# Patient Record
Sex: Female | Born: 1940
Health system: Southern US, Community
[De-identification: ages and names within clinical notes are randomized; demographics above are authoritative.]

## PROBLEM LIST (undated history)

## (undated) DIAGNOSIS — R002 Palpitations: Secondary | ICD-10-CM

## (undated) DIAGNOSIS — D126 Benign neoplasm of colon, unspecified: Secondary | ICD-10-CM

## (undated) DIAGNOSIS — M545 Low back pain, unspecified: Secondary | ICD-10-CM

## (undated) DIAGNOSIS — I10 Essential (primary) hypertension: Secondary | ICD-10-CM

## (undated) DIAGNOSIS — E739 Lactose intolerance, unspecified: Secondary | ICD-10-CM

## (undated) DIAGNOSIS — M25559 Pain in unspecified hip: Secondary | ICD-10-CM

## (undated) DIAGNOSIS — K219 Gastro-esophageal reflux disease without esophagitis: Secondary | ICD-10-CM

## (undated) DIAGNOSIS — K5730 Diverticulosis of large intestine without perforation or abscess without bleeding: Secondary | ICD-10-CM

## (undated) DIAGNOSIS — R195 Other fecal abnormalities: Secondary | ICD-10-CM

## (undated) DIAGNOSIS — E785 Hyperlipidemia, unspecified: Secondary | ICD-10-CM

## (undated) DIAGNOSIS — T884XXA Failed or difficult intubation, initial encounter: Secondary | ICD-10-CM

## (undated) DIAGNOSIS — C801 Malignant (primary) neoplasm, unspecified: Secondary | ICD-10-CM

## (undated) DIAGNOSIS — K635 Polyp of colon: Secondary | ICD-10-CM

## (undated) HISTORY — DX: Lactose intolerance, unspecified: E73.9

## (undated) HISTORY — PX: TUBAL LIGATION: SHX77

## (undated) HISTORY — DX: Benign neoplasm of colon, unspecified: D12.6

## (undated) HISTORY — DX: Essential (primary) hypertension: I10

## (undated) HISTORY — DX: Palpitations: R00.2

## (undated) HISTORY — PX: RECONSTRUCTION OF EYELID: SHX6576

## (undated) HISTORY — DX: Polyp of colon: K63.5

## (undated) HISTORY — DX: Low back pain, unspecified: M54.50

## (undated) HISTORY — DX: Pain in unspecified hip: M25.559

## (undated) HISTORY — PX: ROTATOR CUFF REPAIR: SHX139

## (undated) HISTORY — PX: HAMMER TOE SURGERY: SHX385

## (undated) HISTORY — DX: Diverticulosis of large intestine without perforation or abscess without bleeding: K57.30

## (undated) HISTORY — PX: BACK SURGERY: SHX140

## (undated) HISTORY — DX: Hyperlipidemia, unspecified: E78.5

## (undated) HISTORY — DX: Other fecal abnormalities: R19.5

## (undated) HISTORY — DX: Low back pain: M54.5

## (undated) HISTORY — DX: Malignant (primary) neoplasm, unspecified: C80.1

## (undated) HISTORY — DX: Gastro-esophageal reflux disease without esophagitis: K21.9

## (undated) HISTORY — PX: OTHER SURGICAL HISTORY: SHX169

---

## 2001-08-10 ENCOUNTER — Ambulatory Visit (HOSPITAL_BASED_OUTPATIENT_CLINIC_OR_DEPARTMENT_OTHER): Admission: RE | Admit: 2001-08-10 | Discharge: 2001-08-10 | Payer: Self-pay | Admitting: Orthopaedic Surgery

## 2005-07-14 ENCOUNTER — Other Ambulatory Visit: Admission: RE | Admit: 2005-07-14 | Discharge: 2005-07-14 | Payer: Self-pay | Admitting: Internal Medicine

## 2007-04-15 ENCOUNTER — Encounter (INDEPENDENT_AMBULATORY_CARE_PROVIDER_SITE_OTHER): Payer: Self-pay | Admitting: Family Medicine

## 2007-04-15 ENCOUNTER — Encounter: Payer: Self-pay | Admitting: Family Medicine

## 2007-04-15 ENCOUNTER — Ambulatory Visit: Payer: Self-pay | Admitting: Family Medicine

## 2007-04-15 DIAGNOSIS — M79609 Pain in unspecified limb: Secondary | ICD-10-CM | POA: Insufficient documentation

## 2007-04-15 DIAGNOSIS — K5730 Diverticulosis of large intestine without perforation or abscess without bleeding: Secondary | ICD-10-CM | POA: Insufficient documentation

## 2007-04-15 DIAGNOSIS — M810 Age-related osteoporosis without current pathological fracture: Secondary | ICD-10-CM | POA: Insufficient documentation

## 2007-04-15 DIAGNOSIS — I10 Essential (primary) hypertension: Secondary | ICD-10-CM | POA: Insufficient documentation

## 2007-04-15 DIAGNOSIS — E785 Hyperlipidemia, unspecified: Secondary | ICD-10-CM | POA: Insufficient documentation

## 2007-04-15 LAB — CONVERTED CEMR LAB
AST: 37 units/L (ref 0–37)
BUN: 15 mg/dL (ref 6–23)
CO2: 33 meq/L — ABNORMAL HIGH (ref 19–32)
Calcium: 9.3 mg/dL (ref 8.4–10.5)
Chloride: 106 meq/L (ref 96–112)
Direct LDL: 149.8 mg/dL
GFR calc non Af Amer: 89 mL/min
Glucose, Bld: 99 mg/dL (ref 70–99)

## 2007-04-16 ENCOUNTER — Encounter: Admission: RE | Admit: 2007-04-16 | Discharge: 2007-04-16 | Payer: Self-pay | Admitting: Family Medicine

## 2007-04-16 ENCOUNTER — Telehealth (INDEPENDENT_AMBULATORY_CARE_PROVIDER_SITE_OTHER): Payer: Self-pay | Admitting: Family Medicine

## 2007-04-19 ENCOUNTER — Telehealth (INDEPENDENT_AMBULATORY_CARE_PROVIDER_SITE_OTHER): Payer: Self-pay | Admitting: *Deleted

## 2007-06-08 ENCOUNTER — Encounter (INDEPENDENT_AMBULATORY_CARE_PROVIDER_SITE_OTHER): Payer: Self-pay | Admitting: Family Medicine

## 2007-08-04 ENCOUNTER — Ambulatory Visit: Payer: Self-pay | Admitting: Internal Medicine

## 2007-08-18 ENCOUNTER — Encounter: Payer: Self-pay | Admitting: Internal Medicine

## 2007-08-18 ENCOUNTER — Encounter (INDEPENDENT_AMBULATORY_CARE_PROVIDER_SITE_OTHER): Payer: Self-pay | Admitting: Family Medicine

## 2007-08-18 ENCOUNTER — Ambulatory Visit: Payer: Self-pay | Admitting: Internal Medicine

## 2007-08-23 ENCOUNTER — Ambulatory Visit: Payer: Self-pay | Admitting: Family Medicine

## 2007-08-24 ENCOUNTER — Telehealth (INDEPENDENT_AMBULATORY_CARE_PROVIDER_SITE_OTHER): Payer: Self-pay | Admitting: *Deleted

## 2007-08-24 LAB — CONVERTED CEMR LAB
CO2: 34 meq/L — ABNORMAL HIGH (ref 19–32)
Chloride: 107 meq/L (ref 96–112)
Cholesterol: 201 mg/dL (ref 0–200)
Direct LDL: 129.1 mg/dL
GFR calc non Af Amer: 89 mL/min
Glucose, Bld: 98 mg/dL (ref 70–99)
Sodium: 144 meq/L (ref 135–145)
Total CHOL/HDL Ratio: 4.9
Triglycerides: 170 mg/dL — ABNORMAL HIGH (ref 0–149)

## 2007-08-31 ENCOUNTER — Encounter (INDEPENDENT_AMBULATORY_CARE_PROVIDER_SITE_OTHER): Payer: Self-pay | Admitting: Family Medicine

## 2007-09-28 ENCOUNTER — Encounter (INDEPENDENT_AMBULATORY_CARE_PROVIDER_SITE_OTHER): Payer: Self-pay | Admitting: Family Medicine

## 2007-10-20 ENCOUNTER — Ambulatory Visit: Payer: Self-pay | Admitting: Family Medicine

## 2007-11-23 ENCOUNTER — Ambulatory Visit: Payer: Self-pay | Admitting: Family Medicine

## 2007-11-23 LAB — CONVERTED CEMR LAB
CO2: 32 meq/L (ref 19–32)
Calcium: 9.3 mg/dL (ref 8.4–10.5)
Cholesterol: 193 mg/dL (ref 0–200)
GFR calc Af Amer: 108 mL/min
GFR calc non Af Amer: 89 mL/min
Glucose, Bld: 92 mg/dL (ref 70–99)
LDL Cholesterol: 118 mg/dL — ABNORMAL HIGH (ref 0–99)
Potassium: 3.9 meq/L (ref 3.5–5.1)
Sodium: 141 meq/L (ref 135–145)
Total CHOL/HDL Ratio: 4.6
Triglycerides: 169 mg/dL — ABNORMAL HIGH (ref 0–149)

## 2007-11-24 ENCOUNTER — Encounter (INDEPENDENT_AMBULATORY_CARE_PROVIDER_SITE_OTHER): Payer: Self-pay | Admitting: *Deleted

## 2007-12-14 ENCOUNTER — Telehealth (INDEPENDENT_AMBULATORY_CARE_PROVIDER_SITE_OTHER): Payer: Self-pay | Admitting: *Deleted

## 2008-02-28 ENCOUNTER — Ambulatory Visit: Payer: Self-pay | Admitting: Family Medicine

## 2008-02-28 DIAGNOSIS — D239 Other benign neoplasm of skin, unspecified: Secondary | ICD-10-CM | POA: Insufficient documentation

## 2008-03-06 ENCOUNTER — Encounter (INDEPENDENT_AMBULATORY_CARE_PROVIDER_SITE_OTHER): Payer: Self-pay | Admitting: *Deleted

## 2008-03-09 ENCOUNTER — Encounter (INDEPENDENT_AMBULATORY_CARE_PROVIDER_SITE_OTHER): Payer: Self-pay | Admitting: *Deleted

## 2008-03-25 ENCOUNTER — Encounter: Payer: Self-pay | Admitting: Family Medicine

## 2008-07-11 ENCOUNTER — Ambulatory Visit: Payer: Self-pay | Admitting: Family Medicine

## 2008-07-11 DIAGNOSIS — M545 Low back pain, unspecified: Secondary | ICD-10-CM | POA: Insufficient documentation

## 2008-07-19 ENCOUNTER — Encounter (INDEPENDENT_AMBULATORY_CARE_PROVIDER_SITE_OTHER): Payer: Self-pay | Admitting: *Deleted

## 2008-07-19 LAB — CONVERTED CEMR LAB
ALT: 43 units/L — ABNORMAL HIGH (ref 0–35)
AST: 32 units/L (ref 0–37)
Bilirubin, Direct: 0.1 mg/dL (ref 0.0–0.3)
CO2: 32 meq/L (ref 19–32)
Calcium: 9.7 mg/dL (ref 8.4–10.5)
Chloride: 102 meq/L (ref 96–112)
Creatinine, Ser: 0.8 mg/dL (ref 0.4–1.2)
Glucose, Bld: 87 mg/dL (ref 70–99)
HDL: 38.1 mg/dL — ABNORMAL LOW (ref >39.0)
Total Bilirubin: 0.8 mg/dL (ref 0.3–1.2)
Total CHOL/HDL Ratio: 3.8
Total Protein: 7.6 g/dL (ref 6.0–8.3)
Triglycerides: 124 mg/dL (ref 0–149)

## 2008-08-07 ENCOUNTER — Encounter: Payer: Self-pay | Admitting: Family Medicine

## 2008-10-18 ENCOUNTER — Ambulatory Visit: Payer: Self-pay | Admitting: Family Medicine

## 2008-12-28 ENCOUNTER — Encounter: Payer: Self-pay | Admitting: Family Medicine

## 2008-12-28 ENCOUNTER — Ambulatory Visit: Payer: Self-pay | Admitting: Family Medicine

## 2008-12-28 ENCOUNTER — Other Ambulatory Visit: Admission: RE | Admit: 2008-12-28 | Discharge: 2008-12-28 | Payer: Self-pay | Admitting: Family Medicine

## 2008-12-28 DIAGNOSIS — Z780 Asymptomatic menopausal state: Secondary | ICD-10-CM | POA: Insufficient documentation

## 2008-12-28 DIAGNOSIS — Q742 Other congenital malformations of lower limb(s), including pelvic girdle: Secondary | ICD-10-CM | POA: Insufficient documentation

## 2008-12-28 LAB — HM PAP SMEAR

## 2008-12-29 ENCOUNTER — Encounter (INDEPENDENT_AMBULATORY_CARE_PROVIDER_SITE_OTHER): Payer: Self-pay | Admitting: *Deleted

## 2008-12-29 ENCOUNTER — Encounter: Payer: Self-pay | Admitting: Family Medicine

## 2009-01-01 ENCOUNTER — Telehealth (INDEPENDENT_AMBULATORY_CARE_PROVIDER_SITE_OTHER): Payer: Self-pay | Admitting: *Deleted

## 2009-01-01 ENCOUNTER — Encounter (INDEPENDENT_AMBULATORY_CARE_PROVIDER_SITE_OTHER): Payer: Self-pay | Admitting: *Deleted

## 2009-01-08 ENCOUNTER — Telehealth (INDEPENDENT_AMBULATORY_CARE_PROVIDER_SITE_OTHER): Payer: Self-pay | Admitting: *Deleted

## 2009-01-25 ENCOUNTER — Ambulatory Visit: Payer: Self-pay | Admitting: Family Medicine

## 2009-01-30 ENCOUNTER — Encounter (INDEPENDENT_AMBULATORY_CARE_PROVIDER_SITE_OTHER): Payer: Self-pay | Admitting: *Deleted

## 2009-02-14 ENCOUNTER — Telehealth (INDEPENDENT_AMBULATORY_CARE_PROVIDER_SITE_OTHER): Payer: Self-pay | Admitting: *Deleted

## 2009-05-22 ENCOUNTER — Ambulatory Visit: Payer: Self-pay | Admitting: Family Medicine

## 2009-05-24 ENCOUNTER — Encounter (INDEPENDENT_AMBULATORY_CARE_PROVIDER_SITE_OTHER): Payer: Self-pay | Admitting: *Deleted

## 2009-06-28 ENCOUNTER — Ambulatory Visit: Payer: Self-pay | Admitting: Family Medicine

## 2009-06-28 DIAGNOSIS — R319 Hematuria, unspecified: Secondary | ICD-10-CM | POA: Insufficient documentation

## 2009-06-28 DIAGNOSIS — R300 Dysuria: Secondary | ICD-10-CM | POA: Insufficient documentation

## 2009-06-28 LAB — CONVERTED CEMR LAB
BUN: 18 mg/dL (ref 6–23)
Bilirubin Urine: NEGATIVE
Cholesterol: 146 mg/dL (ref 0–200)
Creatinine, Ser: 0.8 mg/dL (ref 0.4–1.2)
GFR calc non Af Amer: 75.83 mL/min (ref >60)
LDL Cholesterol: 77 mg/dL (ref 0–99)
Nitrite: NEGATIVE
Potassium: 4 meq/L (ref 3.5–5.1)
Total Bilirubin: 1.1 mg/dL (ref 0.3–1.2)
Triglycerides: 138 mg/dL (ref 0.0–149.0)
Urobilinogen, UA: NEGATIVE
VLDL: 27.6 mg/dL (ref 0.0–40.0)

## 2009-07-02 ENCOUNTER — Encounter (INDEPENDENT_AMBULATORY_CARE_PROVIDER_SITE_OTHER): Payer: Self-pay | Admitting: *Deleted

## 2009-07-08 LAB — CONVERTED CEMR LAB: Vit D, 25-Hydroxy: 85 ng/mL (ref 30–89)

## 2009-07-10 ENCOUNTER — Encounter (INDEPENDENT_AMBULATORY_CARE_PROVIDER_SITE_OTHER): Payer: Self-pay | Admitting: *Deleted

## 2010-03-04 ENCOUNTER — Ambulatory Visit: Payer: Self-pay | Admitting: Family Medicine

## 2010-03-04 DIAGNOSIS — K921 Melena: Secondary | ICD-10-CM | POA: Insufficient documentation

## 2010-03-04 DIAGNOSIS — R1013 Epigastric pain: Secondary | ICD-10-CM

## 2010-03-04 DIAGNOSIS — R197 Diarrhea, unspecified: Secondary | ICD-10-CM | POA: Insufficient documentation

## 2010-03-04 DIAGNOSIS — K3189 Other diseases of stomach and duodenum: Secondary | ICD-10-CM | POA: Insufficient documentation

## 2010-03-05 ENCOUNTER — Encounter (INDEPENDENT_AMBULATORY_CARE_PROVIDER_SITE_OTHER): Payer: Self-pay | Admitting: *Deleted

## 2010-04-03 ENCOUNTER — Ambulatory Visit: Payer: Self-pay | Admitting: Internal Medicine

## 2010-04-03 DIAGNOSIS — R198 Other specified symptoms and signs involving the digestive system and abdomen: Secondary | ICD-10-CM | POA: Insufficient documentation

## 2010-04-03 DIAGNOSIS — R195 Other fecal abnormalities: Secondary | ICD-10-CM | POA: Insufficient documentation

## 2010-04-03 DIAGNOSIS — Z860100 Personal history of colon polyps, unspecified: Secondary | ICD-10-CM | POA: Insufficient documentation

## 2010-04-03 LAB — CONVERTED CEMR LAB: Tissue Transglutaminase Ab, IgA: 6.1 units (ref <20)

## 2010-04-04 LAB — CONVERTED CEMR LAB
Albumin: 3.8 g/dL (ref 3.5–5.2)
Alkaline Phosphatase: 65 units/L (ref 39–117)
CO2: 33 meq/L — ABNORMAL HIGH (ref 19–32)
Calcium: 9.5 mg/dL (ref 8.4–10.5)
Creatinine, Ser: 0.8 mg/dL (ref 0.4–1.2)
Eosinophils Relative: 3.4 % (ref 0.0–5.0)
Glucose, Bld: 78 mg/dL (ref 70–99)
HCT: 39.9 % (ref 36.0–46.0)
Hemoglobin: 13.8 g/dL (ref 12.0–15.0)
Lymphs Abs: 2.1 10*3/uL (ref 0.7–4.0)
Monocytes Relative: 8.6 % (ref 3.0–12.0)
Neutro Abs: 3.3 10*3/uL (ref 1.4–7.7)
Total Protein: 6.9 g/dL (ref 6.0–8.3)
WBC: 6.2 10*3/uL (ref 4.5–10.5)

## 2010-05-07 ENCOUNTER — Ambulatory Visit: Payer: Self-pay | Admitting: Internal Medicine

## 2010-05-10 ENCOUNTER — Encounter: Payer: Self-pay | Admitting: Internal Medicine

## 2010-05-15 LAB — CONVERTED CEMR LAB: UREASE: NEGATIVE

## 2010-10-30 ENCOUNTER — Ambulatory Visit: Payer: Self-pay | Admitting: Family Medicine

## 2010-10-30 DIAGNOSIS — J069 Acute upper respiratory infection, unspecified: Secondary | ICD-10-CM | POA: Insufficient documentation

## 2010-10-30 DIAGNOSIS — J019 Acute sinusitis, unspecified: Secondary | ICD-10-CM | POA: Insufficient documentation

## 2010-12-08 HISTORY — PX: EYE SURGERY: SHX253

## 2011-01-05 LAB — CONVERTED CEMR LAB
ALT: 40 units/L — ABNORMAL HIGH (ref 0–35)
AST: 35 units/L (ref 0–37)
Alkaline Phosphatase: 72 units/L (ref 39–117)
BUN: 18 mg/dL (ref 6–23)
BUN: 24 mg/dL — ABNORMAL HIGH (ref 6–23)
Basophils Relative: 1 % (ref 0.0–3.0)
Bilirubin, Direct: 0.1 mg/dL (ref 0.0–0.3)
CO2: 32 meq/L (ref 19–32)
CO2: 33 meq/L — ABNORMAL HIGH (ref 19–32)
Chloride: 104 meq/L (ref 96–112)
Creatinine, Ser: 0.7 mg/dL (ref 0.4–1.2)
Creatinine, Ser: 0.8 mg/dL (ref 0.4–1.2)
Eosinophils Relative: 4.1 % (ref 0.0–5.0)
GFR calc Af Amer: 92 mL/min
Glucose, Bld: 105 mg/dL — ABNORMAL HIGH (ref 70–99)
Glucose, Bld: 87 mg/dL (ref 70–99)
Glucose, Urine, Semiquant: NEGATIVE
Hemoglobin: 14.1 g/dL (ref 12.0–15.0)
LDL Cholesterol: 83 mg/dL (ref 0–99)
Lymphocytes Relative: 20.9 % (ref 12.0–46.0)
Monocytes Relative: 9.5 % (ref 3.0–12.0)
Neutrophils Relative %: 64.5 % (ref 43.0–77.0)
Potassium: 4 meq/L (ref 3.5–5.1)
Protein, U semiquant: NEGATIVE
RBC: 4.47 M/uL (ref 3.87–5.11)
Sodium: 143 meq/L (ref 135–145)
Total Bilirubin: 0.7 mg/dL (ref 0.3–1.2)
Total Bilirubin: 0.9 mg/dL (ref 0.3–1.2)
Total Protein: 7.4 g/dL (ref 6.0–8.3)
Total Protein: 7.5 g/dL (ref 6.0–8.3)
Urobilinogen, UA: NEGATIVE
VLDL: 16 mg/dL (ref 0–40)
Vit D, 1,25-Dihydroxy: 46 (ref 30–89)
WBC Urine, dipstick: NEGATIVE
WBC: 8.1 10*3/uL (ref 4.5–10.5)

## 2011-01-09 NOTE — Assessment & Plan Note (Signed)
Summary: DYSPEPSIA,LOOSE STOOLS & POS STOOLS    History of Present Illness Visit Type: Initial Consult Primary GI MD: Yancey Flemings MD Primary Provider: Janit Bern Chief Complaint: Blood in stool on Dr Hulan Saas examination, and c/o loose stools with bloating and cramping History of Present Illness:   70 year old white female with a history of hypertension, hyperlipidemia, osteoporosis, GERD, diverticulosis, and adenomatous colon polyps. She is sent today regarding change in bowel habits, abdominal discomfort, and Hemoccult-positive stool. The patient was seen on one occasion in the endoscopy center as a direct referral for screening colonoscopy in September of 2008. Examination revealed moderate left-sided diverticulosis and 2 diminutive colon polyps, one of which was adenomatous. Follow up in 5 years recommended. She reports a many year history of normal bowel movements occurring once per day until November 2010 when she developed a significant diarrheal illness associated with urgency and occasional incontinence. In time, the disorder resolved and she did well in January and February only to have recurrent symptoms in March of this year. Currently she describes her stool is somewhat loose, but not diarrhea. Stools are described as thin and there is urgency. Generalized abdominal cramping as well. Symptoms are exacerbated by meals. She has about 5-6 bowel movements per day. Occasional nocturnal symptoms. No obvious bleeding. She said 8 or 9 pound weight gain over the past few months. She attributes this to increased starches which she states causes less abdominal complaints. She also been complaining of belching and was placed on Nexium. She thinks this helped the belching but give her heartburn. She has been off the drug for one week. Routine physical exam on March 28 found Hemoccult-positive stool. She also complains of significant fatigue. No laboratories in about one year. No new medications. No exotic  travel. No other persons that she is in contact with having similar symptoms.. Her chronic medical problems are stable   GI Review of Systems    Reports acid reflux, bloating, and  heartburn.      Denies abdominal pain, belching, chest pain, dysphagia with liquids, dysphagia with solids, loss of appetite, nausea, vomiting, vomiting blood, weight loss, and  weight gain.      Reports change in bowel habits.     Denies anal fissure, black tarry stools, constipation, diarrhea, diverticulosis, fecal incontinence, heme positive stool, hemorrhoids, irritable bowel syndrome, jaundice, light color stool, liver problems, rectal bleeding, and  rectal pain.    Current Medications (verified): 1)  Crestor 20 Mg  Tabs (Rosuvastatin Calcium) .... Take One Tablet Daily 2)  Hydrochlorothiazide 25 Mg  Tabs (Hydrochlorothiazide) .... Take One Half Tablet Daily 3)  Adult Aspirin Ec Low Strength 81 Mg  Tbec (Aspirin) .Marland Kitchen.. 1 By Mouth Once Daily 4)  Caltrate 600+d 600-400 Mg-Unit  Tabs (Calcium Carbonate-Vitamin D) .... Take By Mouth Once Daily 5)  Tylenol Pm Extra Strength 500-25 Mg  Tabs (Diphenhydramine-Apap (Sleep)) .... Take By Mouth At Bedtime As Needed Sleep 6)  Aleve 220 Mg  Tabs (Naproxen Sodium) .... Take By Mouth As Needed 7)  Cvs Vitamin D 2000 Unit Tabs (Cholecalciferol) .... Take 1 By Mouth Once Daily 8)  Optivar 0.05 % Soln (Azelastine Hcl) .Marland Kitchen.. 1 Gtt Two Times A Day  Allergies (verified): No Known Drug Allergies  Past History:  Past Medical History: Reviewed history from 04/01/2010 and no changes required. Hyperlipidemia Hypertension Osteoporosis Diverticulosis, colon GERD Adenomatous Colon Polyps/hyperplastic polyps  Past Surgical History: Reviewed history from 12/28/2008 and no changes required. Tubal ligation Rotator cuff repair L hammer toe  b/L  Family History: Family History Hypertension Family History of Stroke M 1st degree relative 47s No FH of Colon Cancer:  Social  History: Occupation: retired Married with 4 children Alcohol use-yes Retired Former Smoker Regular exercise-yes  Review of Systems       The patient complains of back pain and fatigue.  The patient denies allergy/sinus, anemia, anxiety-new, arthritis/joint pain, blood in urine, breast changes/lumps, change in vision, confusion, cough, coughing up blood, depression-new, fainting, fever, headaches-new, hearing problems, heart murmur, heart rhythm changes, itching, menstrual pain, muscle pains/cramps, night sweats, nosebleeds, pregnancy symptoms, shortness of breath, skin rash, sleeping problems, sore throat, swelling of feet/legs, swollen lymph glands, thirst - excessive , urination - excessive , urination changes/pain, urine leakage, vision changes, and voice change.    Vital Signs:  Patient profile:   70 year old female Height:      60.2 inches Weight:      161 pounds BMI:     31.35 BSA:     1.71 Pulse rate:   80 / minute Pulse rhythm:   regular BP sitting:   132 / 68  (left arm)  Vitals Entered By: Merri Ray CMA Duncan Dull) (April 03, 2010 2:36 PM)  Physical Exam  General:  Well developed, well nourished, no acute distress. Head:  Normocephalic and atraumatic. Eyes:  PERRLA, no icterus. Ears:  Normal auditory acuity. Nose:  No deformity, discharge,  or lesions. Mouth:  No deformity or lesions, dentition normal. Neck:  Supple; no masses or thyromegaly. Lungs:  Clear throughout to auscultation. Heart:  Regular rate and rhythm; no murmurs, rubs,  or bruits. Abdomen:  Soft, nontender and nondistended. No masses, hepatosplenomegaly or hernias noted. Normal bowel sounds. Rectal:  deferred until colonoscopy Msk:  Symmetrical with no gross deformities. Normal posture. Pulses:  Normal pulses noted. Extremities:  No clubbing, cyanosis, edema or deformities noted. Neurologic:  Alert and  oriented x4;  grossly normal neurologically. Skin:  Intact without significant lesions or  rashes. Psych:  Alert and cooperative. Normal mood and affect.   Impression & Recommendations:  Problem # 1:  CHANGE IN BOWELS (ICD-787.99) Change in bowel habits as manifested by increased frequency of stools, looser consistency, and urgency. As well associated abdominal discomfort and cramping. Possible etiologies include postinfectious irritable bowel syndrome, occult infection, medication reaction, microscopic or macroscopic colitis.  Plan: #1. Tissue transglutaminase antibody #2. Thyroid profile #3. Colonoscopy with biopsies. The nature of the procedure as well as the risks, benefits, and alternatives were reviewed. She understood and agreed to proceed #4. Movi prep prescribed. The patient instructed on its use #5. Empiric course of metronidazole 250 mg p.o. t.i.d. x10 days  Problem # 2:  FECAL OCCULT BLOOD (ICD-792.1) Hemoccult-positive stool. Rule out mucosal lesion of the colon such as colitis or recurrent neoplasia. Rule out occult upper GI lesion such as ulcer.  Plan: #1. CBC to rule out anemia #2. Colonoscopy and upper endoscopy with biopsies as indicated  Problem # 3:  PERSONAL HX COLONIC POLYPS (ICD-V12.72) history of adenomatous colon polyps in 2008. Due for routine followup in 2013. However, because of change in bowel habits and Hemoccult-positive stool, need to proceed with colonoscopy at this time. Recurrent neoplasia to be excluded.  Problem # 4:  fatigue to further assess fatigue, and in addition to aforementioned CBC and thyroid profile, will obtain comprehensive metabolic panel.  Other Orders: TLB-CBC Platelet - w/Differential (85025-CBCD) TLB-BMP (Basic Metabolic Panel-BMET) (80048-METABOL) TLB-Hepatic/Liver Function Pnl (80076-HEPATIC) TLB-TSH (Thyroid Stimulating Hormone) (84443-TSH) T-Tissue Transglutamase Ab  IgA [phone]) Colon/Endo (Colon/Endo)  Patient Instructions: 1)  Colonoscopy/Endoscopy [facility] 05/07/10 1:30 pm arrive at 12:30 pm 2)  Movi prep  instructions given to patient and Rx sent to your pharmacy for pick up. 3)  Colonoscopy and Flexible Sigmoidoscopy brochure given.  4)  Upper Endoscopy brochure given.  5)  Labs ordered to have drawn today on basement floor 6)  Flagyl 250 mg 1 by mouth three times a day x 10 days #30 NRs. Rx. sent to your pharmacy. 7)  Copy Sent to:  Loreen Freud, DO 8)  The medication list was reviewed and reconciled.  All changed / newly prescribed medications were explained.  A complete medication list was provided to the patient / caregiver. 9)  printed and given to patient. Milford Cage [facility]  April 03, 2010 3:14 PM Prescriptions: FLAGYL 250 MG TABS (METRONIDAZOLE) 1 by mouth three times a day x 10 days  #30 x 0   Entered by:   Milford Cage NCMA   Authorized by:   Hilarie Fredrickson MD   Signed by:   Milford Cage NCMA on 04/03/2010   Method used:   Electronically to        [id number] (retail)       [address]       [place], [place]  [address]       Ph: [phone] or [phone]       Fax: [phone]   RxID:   [id number] MOVIPREP 100 GM  SOLR (PEG-KCL-NACL-NASULF-NA ASC-C) As per prep instructions.  #1 x 0   Entered by:   Milford Cage NCMA   Authorized by:   Hilarie Fredrickson MD   Signed by:   Milford Cage NCMA on 04/03/2010   Method used:   Electronically to        [id number]* (retail)       [address]       [place], [place]  [address]       Ph: [phone] or [phone]       Fax: [phone]   RxID:   [id number]

## 2011-01-09 NOTE — Letter (Signed)
Summary: Patient Notice- Colon Biospy Results  [facility]  [address] [place], [place] [address]   Phone: [phone]  Fax: [phone]        May 10, 2010 MRN: [id number]    [facility] [address] [place], [place]  [address]    Dear Ms. Fein,  I am pleased to inform you that the biopsies taken during your recent colonoscopy were normal.  Also, the testing for H.Pylori bacteria was negative (normal).  Additional information/recommendations:  __No further action is needed at this time.  Please follow-up with      your primary care physician for your other healthcare needs.   __Because of your prior history of polyps,You should have a repeat colonoscopy examination in 5 years.    Please call us if you are having persistent / recurrent problems or have questions about your condition that have not been fully answered at this time.  Sincerely,  Hilarie Fredrickson MD   This letter has been electronically signed by your physician.  Appended Document: Patient Notice- Colon Biospy Results letter mailed.

## 2011-01-09 NOTE — Assessment & Plan Note (Signed)
Summary: having bowel movements frequently/watery eyes/kdc   Vital Signs:  Patient profile:   70 year old female Weight:      161 pounds Pulse rate:   82 / minute Pulse rhythm:   regular BP sitting:   120 / 76  (left arm) Cuff size:   regular  Vitals Entered By: Army Fossa CMA (March 04, 2010 3:31 PM) CC: Pt here her eyes are watering a lot over the past couple of months, having more frequent bowel movements, not diarrhea. Always feels pressure like she has to use the restroom.   History of Present Illness: Pt here c/o frequent BM over the last few months.  Pt having small thin BMs and a lot of gas.   She will go to BR 5-6 x a day and the first 3 in the first 1- 1 1/2 hours.  Pt states her stomach is always upset and is burping a lot.   Pt feels a lot of pressure when she is sitting but not in BR.  No blood in stool.     Pt also c/o R eye > L watering a lot.  Comes and goes ---itchy.  No otc meds tried.   Current Medications (verified): 1)  Crestor 20 Mg  Tabs (Rosuvastatin Calcium) .... Take One Tablet Daily 2)  Hydrochlorothiazide 25 Mg  Tabs (Hydrochlorothiazide) .... Take One Half Tablet Daily 3)  Adult Aspirin Ec Low Strength 81 Mg  Tbec (Aspirin) 4)  Caltrate 600+d 600-400 Mg-Unit  Tabs (Calcium Carbonate-Vitamin D) 5)  Tylenol Pm Extra Strength 500-25 Mg  Tabs (Diphenhydramine-Apap (Sleep)) 6)  Aleve 220 Mg  Tabs (Naproxen Sodium) 7)  Cvs Vitamin D 2000 Unit Tabs (Cholecalciferol) .... Daiy 8)  Optivar 0.05 % Soln (Azelastine Hcl) .Marland Kitchen.. 1 Gtt Two Times A Day  Allergies (verified): No Known Drug Allergies  Past History:  Past medical, surgical, family and social histories (including risk factors) reviewed for relevance to current acute and chronic problems.  Past Medical History: Reviewed history from 04/15/2007 and no changes required. Hyperlipidemia Hypertension Osteoporosis Diverticulosis, colon GERD  Past Surgical History: Reviewed history from 12/28/2008  and no changes required. Tubal ligation Rotator cuff repair L hammer toe b/L  Family History: Reviewed history from 04/15/2007 and no changes required. Family History Hypertension Family History of Stroke M 1st degree relative 27s  Social History: Reviewed history from 12/28/2008 and no changes required. 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 Lungs are clear to auscultation, no crackles or wheezes. Abdomen:  Bowel sounds positive,abdomen soft and non-tender without masses, organomegaly or hernias noted. Rectal:  No external abnormalities noted. Normal sphincter tone. No rectal masses or tenderness.stool positive for occult blood.   Psych:  Oriented X3 and normally interactive.     Impression & Recommendations:  Problem # 1:  DYSPEPSIA (ICD-536.8) nexium 40 mg 1 by mouth once daily  Orders: Gastroenterology Referral (GI)  Problem # 2:  GUAIAC POSITIVE STOOL (ICD-578.1)  Orders: Gastroenterology Referral (GI)  Problem # 3:  LOOSE STOOLS (ICD-787.91)  Orders: Gastroenterology Referral (GI)  Discussed symptom control and diet. Call if worsening of symptoms or signs of dehydration.   Complete Medication List: 1)  Crestor 20 Mg Tabs (Rosuvastatin calcium) .... Take one  tablet daily 2)  Hydrochlorothiazide 25 Mg Tabs (Hydrochlorothiazide) .... Take one half tablet daily 3)  Adult Aspirin Ec Low Strength 81 Mg Tbec (Aspirin) 4)  Caltrate 600+d 600-400 Mg-unit Tabs (Calcium carbonate-vitamin d) 5)  Tylenol Pm Extra Strength 500-25 Mg Tabs (Diphenhydramine-apap (sleep)) 6)  Aleve 220 Mg Tabs (Naproxen sodium) 7)  Cvs Vitamin D 2000 Unit Tabs  (Cholecalciferol) .... Daiy 8)  Optivar 0.05 % Soln (Azelastine hcl) .Marland Kitchen.. 1 gtt two times a day 9)  Nexium 40 Mg Cpdr (Esomeprazole magnesium) .Marland Kitchen.. 1 by mouth once daily Prescriptions: OPTIVAR 0.05 % SOLN (AZELASTINE HCL) 1 gtt two times a day  #60month x 2   Entered and Authorized by:   Loreen Freud DO   Signed by:   Loreen Freud DO on 03/04/2010   Method used:   Electronically to        [id number] (retail)       [address]       [place], [place]  [address]       Ph: [phone] or [phone]       Fax: [phone]   RxID:   [id number]

## 2011-01-09 NOTE — Miscellaneous (Signed)
Summary: clotest  Clinical Lists Changes  Orders: Added new Test order of TLB-H Pylori Screen Gastric Biopsy (83013-CLOTEST) - Signed 

## 2011-01-09 NOTE — Letter (Signed)
Summary: New Patient letter  [facility]  [address] [place], [place] [address]   Phone: [phone]  Fax: [phone]       03/05/2010 MRN: [id number]  [facility] [address] [place], [place]  [address]  Dear Ms. [facility],  Welcome to the Gastroenterology Division at [facility].    You are scheduled to see Dr.  Marina Goodell on 04-03-10 at 2:45pm on the 3rd floor at [facility], [address]  We ask that you try to arrive at our office 15 minutes prior to your appointment time to allow for check-in.  We would like you to complete the enclosed self-administered evaluation form prior to your visit and bring it with you on the day of your appointment.  We will review it with you.  Also, please bring a complete list of all your medications or, if you prefer, bring the medication bottles and we will list them.  Please bring your insurance card so that we may make a copy of it.  If your insurance requires a referral to see a specialist, please bring your referral form from your primary care physician.  Co-payments are due at the time of your visit and may be paid by cash, check or credit card.     Your office visit will consist of a consult with your physician (includes a physical exam), any laboratory testing he/she may order, scheduling of any necessary diagnostic testing (e.g. x-ray, ultrasound, CT-scan), and scheduling of a procedure (e.g. Endoscopy, Colonoscopy) if required.  Please allow enough time on your schedule to allow for any/all of these possibilities.    If you cannot keep your appointment, please call [phone] to cancel or reschedule prior to your appointment date.  This allows [place] the opportunity to schedule an appointment for another patient in need of care.  If you do not cancel or reschedule by 5 p.m. the business day prior to your appointment date, you will be charged a $50.00 late cancellation/no-show fee.    Thank you for choosing [facility] for your medical needs.  We appreciate the opportunity to care for you.  Please visit [place] at our website  to learn more about our practice.                     Sincerely,                                                             The Gastroenterology Division

## 2011-01-09 NOTE — Procedures (Signed)
Summary: Upper Endoscopy  Patient: Cindy Velasquez Note: All result statuses are Final unless otherwise noted.  Tests: (1) Upper Endoscopy (EGD)   EGD Upper Endoscopy       DONE     [facility]     [address]     [place], [place]  [address]           ENDOSCOPY PROCEDURE REPORT           PATIENT:  Cindy, Velasquez  MR#:  [id number]     BIRTHDATE:  [birth year], 68 yrs. old  GENDER:  female           ENDOSCOPIST:  Wilhemina Bonito. Eda Keys, MD     Referred by:  Office           PROCEDURE DATE:  05/07/2010     PROCEDURE:  EGD with biopsy     ASA CLASS:  Class II     INDICATIONS:  hemeoccult positive stool           MEDICATIONS:   There was residual sedation effect present from     prior procedure.     TOPICAL ANESTHETIC:  Exactacain Spray           DESCRIPTION OF PROCEDURE:   After the risks benefits and     alternatives of the procedure were thoroughly explained, informed     consent was obtained.  The LB [id number]-H180 [id number] endoscope was     introduced through the mouth and advanced to the third portion of     the duodenum, without limitations.  The instrument was slowly     withdrawn as the mucosa was fully examined.     <<PROCEDUREIMAGES>>           The upper, middle, and distal third of the esophagus were     carefully inspected and no abnormalities were noted. The z-line     was well seen at the GEJ. The endoscope was pushed into the fundus     which was normal including a retroflexed view. The antrum,gastric     body, and second/third part of the duodenum were unremarkable. The     bulb revealed mild erythema and edema c/w nonerosive duodenitis.     CLO Bx taken.   Retroflexed views revealed no abnormalities.     The scope was then withdrawn from the patient and the procedure     completed.           COMPLICATIONS:  None           ENDOSCOPIC IMPRESSION:     1) Mild nonerosive Duodenitis in the bulb     2) Normal EGD otherwise     RECOMMENDATIONS:     1) Rx CLO if  positive     2) GI follow up as needed           ______________________________     Wilhemina Bonito. Eda Keys, MD           CC:  Lelon Perla, DO, The Patient           n.     eSIGNEDWilhemina Bonito. Eda Keys at 05/07/2010 02:26 PM           Percell [place], [phone]  Note: An exclamation mark (!) indicates a result that was not dispersed into the flowsheet. Document Creation Date: 05/07/2010 2:26 PM _______________________________________________________________________  (1) Order result status: Final Collection or observation date-time: 05/07/2010 14:19 Requested date-time:  Receipt date-time:  Reported date-time:  Referring Physician:   Ordering Physician: Fransico Setters [id number]) Specimen Source:  Source: Launa Grill Order Number: [id number] Lab site:

## 2011-01-09 NOTE — Procedures (Signed)
Summary: Colonoscopy  Patient: Cindy Velasquez Note: All result statuses are Final unless otherwise noted.  Tests: (1) Colonoscopy (COL)   COL Colonoscopy           DONE     [facility]     [address]     [place], [place]  [address]           COLONOSCOPY PROCEDURE REPORT           PATIENT:  Cindy Velasquez, Cindy Velasquez  MR#:  [id number]     BIRTHDATE:  Dec 01, 1941, 68 yrs. old  GENDER:  female     ENDOSCOPIST:  Wilhemina Bonito. Eda Keys, MD     REF. BY:  Office     PROCEDURE DATE:  05/07/2010     PROCEDURE:  Colonoscopy with biopsies     ASA CLASS:  Class II     INDICATIONS:  heme positive stool, change in bowel habits     (improved since O.V.), history of pre-cancerous (adenomatous)     colon polyps ; small adenoma 08-2007     MEDICATIONS:   Fentanyl 100 mcg IV, Versed 10 mg IV, Benadryl 50     mg IV           DESCRIPTION OF PROCEDURE:   After the risks benefits and     alternatives of the procedure were thoroughly explained, informed     consent was obtained.  Digital rectal exam was performed and     revealed no abnormalities.   The LB [id number] endoscope     was introduced through the anus and advanced to the cecum, which     was identified by both the appendix and ileocecal valve, without     limitations.Time to cecum= 4:40 min. The quality of the prep was     excellent, using MoviPrep.  The instrument was then slowly     withdrawn (time = 10:44 min.) as the colon was fully examined.     <<PROCEDUREIMAGES>>           FINDINGS:  Moderate diverticulosis was found in the sigmoid colon.     This was otherwise a normal examination of the colon and colonic     mucosa. Random colon bx (6) taken..  No polyps or cancers were     seen.  The terminal ileum appeared normal.   Retroflexed views in     the rectum revealed no abnormalities.    The scope was then     withdrawn from the patient and the procedure completed.           COMPLICATIONS:  None     ENDOSCOPIC IMPRESSION:     1)  Moderate diverticulosis in the sigmoid colon     2) Otherwise normal examination     3) No polyps or cancers     4) Normal terminal ileum           RECOMMENDATIONS:     1) Follow up colonoscopy in 5 years (hx adenomas)     2) Await biopsy results     3) EGD today           ______________________________     Wilhemina Bonito. Eda Keys, MD           CC:  Lelon Perla, DO; The Patient           n.     eSIGNED:   Barnell Shieh N. Eda Keys at 05/07/2010 02:14 PM  [place], [place], [phone]  Note: An exclamation mark (!) indicates a result that was not dispersed into the flowsheet. Document Creation Date: 05/07/2010 2:14 PM _______________________________________________________________________  (1) Order result status: Final Collection or observation date-time: 05/07/2010 14:07 Requested date-time:  Receipt date-time:  Reported date-time:  Referring Physician:   Ordering Physician: Fransico Setters [phone]) Specimen Source:  Source: Launa Grill Order Number: [id number] Lab site:   Appended Document: Colonoscopy recall in 5 yrs     Procedures Next Due Date:    Colonoscopy: 04/2015

## 2011-01-09 NOTE — Letter (Signed)
Summary: [facility] Instructions  [facility]  [address] [place], [place] [address]   Phone: [phone]  Fax: [phone]       Cindy Velasquez    28-Apr-1941    MRN: [id number]        Procedure Day /Date:TUESDAY, 05/07/10     Arrival Time:12:30 PM     Procedure Time:1:30 PM     Location of Procedure:                    [facility] (4th Floor)   PREPARATION FOR COLONOSCOPY WITH MOVIPREP/ENDO   Starting 5 days prior to your procedure 05/02/10 do not eat nuts, seeds, popcorn, corn, beans, peas,  salads, or any raw vegetables.  Do not take any fiber supplements (e.g. Metamucil, Citrucel, and Benefiber).  THE DAY BEFORE YOUR PROCEDURE         DATE: 05/06/10  DAY: MONDAY 1.  Drink clear liquids the entire day-NO SOLID FOOD  2.  Do not drink anything colored red or purple.  Avoid juices with pulp.  No orange juice.  3.  Drink at least 64 oz. (8 glasses) of fluid/clear liquids during the day to prevent dehydration and help the prep work efficiently.  CLEAR LIQUIDS INCLUDE: Water Jello Ice Popsicles Tea (sugar ok, no milk/cream) Powdered fruit flavored drinks Coffee (sugar ok, no milk/cream) Gatorade Juice: apple, white grape, white cranberry  Lemonade Clear bullion, consomm, broth Carbonated beverages (any kind) Strained chicken noodle soup Hard Candy                             4.  In the morning, mix first dose of MoviPrep solution:    Empty 1 Pouch A and 1 Pouch B into the disposable container    Add lukewarm drinking water to the top line of the container. Mix to dissolve    Refrigerate (mixed solution should be used within 24 hrs)  5.  Begin drinking the prep at 5:00 p.m. The MoviPrep container is divided by 4 marks.   Every 15 minutes drink the solution down to the next mark (approximately 8 oz) until the full liter is complete.   6.  Follow completed prep with 16 oz of clear liquid of your choice (Nothing red or purple).  Continue  to drink clear liquids until bedtime.  7.  Before going to bed, mix second dose of MoviPrep solution:    Empty 1 Pouch A and 1 Pouch B into the disposable container    Add lukewarm drinking water to the top line of the container. Mix to dissolve    Refrigerate  THE DAY OF YOUR PROCEDURE      DATE: 05/07/10 DAY: TUESDAY  Beginning at 8:30 a.m. (5 hours before procedure):         1. Every 15 minutes, drink the solution down to the next mark (approx 8 oz) until the full liter is complete.  2. Follow completed prep with 16 oz. of clear liquid of your choice.    3. You may drink clear liquids until 11:30 AM(2 HOURS BEFORE PROCEDURE).   MEDICATION INSTRUCTIONS  Unless otherwise instructed, you should take regular prescription medications with a small sip of water   as early as possible the morning of your procedure.         OTHER INSTRUCTIONS  You will need a responsible adult at least 70 years of age to accompany you  and drive you home.   This person must remain in the waiting room during your procedure.  Wear loose fitting clothing that is easily removed.  Leave jewelry and other valuables at home.  However, you may wish to bring a book to read or  an iPod/MP3 player to listen to music as you wait for your procedure to start.  Remove all body piercing jewelry and leave at home.  Total time from sign-in until discharge is approximately 2-3 hours.  You should go home directly after your procedure and rest.  You can resume normal activities the  day after your procedure.  The day of your procedure you should not:   Drive   Make legal decisions   Operate machinery   Drink alcohol   Return to work  You will receive specific instructions about eating, activities and medications before you leave.    The above instructions have been reviewed and explained to me by   _______________________    I fully understand and can verbalize these instructions  _____________________________ Date _________

## 2011-01-09 NOTE — Letter (Signed)
Summary: New Patient letter  [facility]  [address]   [place], [place] [address]   Phone: [phone]  Fax: [phone]       03/05/2010 MRN: [id number]  Cindy Velasquez [address] [place], [place]  [address]  Dear Ms. Hawbaker,  Welcome to the Gastroenterology Division at [facility].    You are scheduled to see Dr.  PERRY on 04-03-10 at 2:45pm on the 3rd floor at [facility], [address].  We ask that you try to arrive at our office 15 minutes prior to your appointment time to allow for check-in.  We would like you to complete the enclosed self-administered evaluation form prior to your visit and bring it with you on the day of your appointment.  We will review it with you.  Also, please bring a complete list of all your medications or, if you prefer, bring the medication bottles and we will list them.  Please bring your insurance card so that we may make a copy of it.  If your insurance requires a referral to see a specialist, please bring your referral form from your primary care physician.  Co-payments are due at the time of your visit and may be paid by cash, check or credit card.     Your office visit will consist of a consult with your physician (includes a physical exam), any laboratory testing he/she may order, scheduling of any necessary diagnostic testing (e.g. x-ray, ultrasound, CT-scan), and scheduling of a procedure (e.g. Endoscopy, Colonoscopy) if required.  Please allow enough time on your schedule to allow for any/all of these possibilities.    If you cannot keep your appointment, please call [phone] to cancel or reschedule prior to your appointment date.  This allows us the opportunity to schedule an appointment for another patient in need of care.  If you do not cancel or reschedule by 5 p.m. the business day prior to your appointment date, you will be charged a $50.00 late cancellation/no-show fee.    Thank you for choosing [facility] for your medical needs.  We appreciate the opportunity to care for you.  Please visit us at our website  to learn more about our practice.                     Sincerely,                                                             The Gastroenterology Division  

## 2011-01-09 NOTE — Assessment & Plan Note (Signed)
Summary: COLD//PH   Vital Signs:  Patient profile:   70 year old female Weight:      163.2 pounds O2 Sat:      96 % on Room air Temp:     98.0 degrees F oral Pulse rate:   86 / minute Pulse rhythm:   regular BP sitting:   112 / 68  (right arm) Cuff size:   regular  Vitals Entered By: Almeta Monas CMA Duncan Dull) (October 30, 2010 9:42 AM)  O2 Flow:  Room air CC: x2weeks c/o runny nose, scratchy throat, fullness in the ears and post nasal drainage, URI symptoms   History of Present Illness:       This is a 70 year old woman who presents with URI symptoms.  The symptoms began 2 weeks ago.  Pt was taking [facility] allergy pill with some relief but not much.  The patient complains of nasal congestion, purulent nasal discharge, sore throat, and dry cough, but denies clear nasal discharge, productive cough, earache, and sick contacts.  Associated symptoms include low-grade fever (<100.5 degrees).  The patient denies fever, fever of 100.5-103 degrees, fever of 103.1-104 degrees, fever to >104 degrees, stiff neck, dyspnea, wheezing, rash, vomiting, diarrhea, use of an antipyretic, and response to antipyretic.  The patient denies itchy watery eyes, itchy throat, sneezing, seasonal symptoms, response to antihistamine, headache, muscle aches, and severe fatigue.  The patient denies the following risk factors for Strep sinusitis: unilateral facial pain, unilateral nasal discharge, poor response to decongestant, double sickening, tooth pain, Strep exposure, tender adenopathy, and absence of cough.    Current Medications (verified): 1)  Crestor 20 Mg  Tabs (Rosuvastatin Calcium) .... Take One Tablet Daily 2)  Hydrochlorothiazide 25 Mg  Tabs (Hydrochlorothiazide) .... Take One Half Tablet Daily 3)  Adult Aspirin Ec Low Strength 81 Mg  Tbec (Aspirin) .Marland Kitchen.. 1 By Mouth Once Daily 4)  Caltrate 600+d 600-400 Mg-Unit  Tabs (Calcium Carbonate-Vitamin D) .... Take By Mouth Once Daily 5)  Tylenol Pm Extra Strength  500-25 Mg  Tabs (Diphenhydramine-Apap (Sleep)) .... Take By Mouth At Bedtime As Needed Sleep 6)  Aleve 220 Mg  Tabs (Naproxen Sodium) .... Take By Mouth As Needed 7)  Cvs Vitamin D 2000 Unit Tabs (Cholecalciferol) .... Take 1 By Mouth Once Daily 8)  Optivar 0.05 % Soln (Azelastine Hcl) .Marland Kitchen.. 1 Gtt Two Times A Day 9)  Augmentin 875-125 Mg Tabs (Amoxicillin-Pot Clavulanate) .Marland Kitchen.. 1 By Mouth Two Times A Day 10)  Flonase 50 Mcg/act Susp (Fluticasone Propionate) .... 2 Sprays Each Nostril Once Daily  Allergies (verified): No Known Drug Allergies  Past History:  Past Medical History: Last updated: 04/01/2010 Hyperlipidemia Hypertension Osteoporosis Diverticulosis, colon GERD Adenomatous Colon Polyps/hyperplastic polyps  Past Surgical History: Last updated: 12/28/2008 Tubal ligation Rotator cuff repair L hammer toe b/L  Family History: Last updated: 04/03/2010 Family History Hypertension Family History of Stroke M 1st degree relative 60s No FH of Colon Cancer:  Social History: Last updated: 04/03/2010 Occupation: retired Married with 4 children Alcohol use-yes Retired Former Smoker Regular exercise-yes  Risk Factors: Alcohol Use: <1 (04/15/2007) Caffeine Use: 0 (12/28/2008) Exercise: yes (12/28/2008)  Risk Factors: Smoking Status: quit (12/28/2008) Packs/Day: 1/2 (04/15/2007) Cans of tobacco/wk: no (04/15/2007) Passive Smoke Exposure: no (12/28/2008)  Family History: Reviewed history from 04/03/2010 and no changes required. Family History Hypertension Family History of Stroke M 1st degree relative 60s No FH of Colon Cancer:  Social History: Reviewed history from 04/03/2010 and no changes required. Occupation: retired  Married with 4 children Alcohol use-yes Retired Former Smoker Regular exercise-yes  Review of Systems      See HPI  Physical Exam  General:  Well-developed,well-nourished,in no acute distress; alert,appropriate and cooperative throughout  examination Ears:  External ear exam shows no significant lesions or deformities.  Otoscopic examination reveals clear canals, tympanic membranes are intact bilaterally without bulging, retraction, inflammation or discharge. Hearing is grossly normal bilaterally. Nose:  mucosal erythema and mucosal edema.   Mouth:  pharyngeal erythema and postnasal drip.   Neck:  No deformities, masses, or tenderness noted. Lungs:  Normal respiratory effort, chest expands symmetrically. Lungs are clear to auscultation, no crackles or wheezes. Heart:  normal rate and no murmur.   Psych:  Oriented X3 and normally interactive.     Impression & Recommendations:  Problem # 1:  SINUSITIS - ACUTE-NOS (ICD-461.9)  The following medications were removed from the medication list:    Flagyl 250 Mg Tabs (Metronidazole) .Marland Kitchen... 1 by mouth three times a day x 10 days Her updated medication list for this problem includes:    Augmentin 875-125 Mg Tabs (Amoxicillin-pot clavulanate) .Marland Kitchen... 1 by mouth two times a day    Flonase 50 Mcg/act Susp (Fluticasone propionate) .Marland Kitchen... 2 sprays each nostril once daily    con't rite antihistamine  Instructed on treatment. Call if symptoms persist or worsen.   Complete Medication List: 1)  Crestor 20 Mg Tabs (Rosuvastatin calcium) .... Take one tablet daily 2)  Hydrochlorothiazide 25 Mg Tabs (Hydrochlorothiazide) .... Take one half tablet daily 3)  Adult Aspirin Ec Low Strength 81 Mg Tbec (Aspirin) .Marland Kitchen.. 1 by mouth once daily 4)  Caltrate 600+d 600-400 Mg-unit Tabs (Calcium carbonate-vitamin d) .... Take by mouth once daily 5)  Tylenol Pm Extra Strength 500-25 Mg Tabs (Diphenhydramine-apap (sleep)) .... Take by mouth at bedtime as needed sleep 6)  Aleve 220 Mg Tabs (Naproxen sodium) .... Take by mouth as needed 7)  Cvs Vitamin D 2000 Unit Tabs (Cholecalciferol) .... Take 1 by mouth once daily 8)  Optivar 0.05 % Soln (Azelastine hcl) .Marland Kitchen.. 1 gtt two times a day 9)  Augmentin 875-125 Mg  Tabs (Amoxicillin-pot clavulanate) .Marland Kitchen.. 1 by mouth two times a day 10)  Flonase 50 Mcg/act Susp (Fluticasone propionate) .... 2 sprays each nostril once daily     Patient Instructions: 1)  Acute sinusitis symptoms for less than 10 days are not helped by antibiotics. Use warm moist compresses, and over the counter decongestants( only as directed). Call if no improvement in 5-7 days, sooner if increasing pain, fever, or new symptoms.  Prescriptions: FLONASE 50 MCG/ACT SUSP (FLUTICASONE PROPIONATE) 2 sprays each nostril once daily  #1 x 5   Entered and Authorized by:   Loreen Freud DO   Signed by:   Loreen Freud DO on 10/30/2010   Method used:   Faxed to ...       [facility]  [address] [id number]* (retail)       [address]       [place], [place]  [address]       Ph: [phone] or [phone]       Fax: [phone]   RxID:   [id number] AUGMENTIN 875-125 MG TABS (AMOXICILLIN-POT CLAVULANATE) 1 by mouth two times a day  #20 x 0   Entered and Authorized by:   Loreen Freud DO   Signed by:   Loreen Freud DO on 10/30/2010   Method used:  Faxed to ...       [facility]  [address] [id number]* (retail)       [address]       [place], [place]  [address]       Ph: [phone] or [phone]       Fax: [phone]   RxID:   [id number]    Orders Added: 1)  Est. Patient Level III [[address]]

## 2011-02-28 ENCOUNTER — Other Ambulatory Visit: Payer: Self-pay | Admitting: Family Medicine

## 2011-04-15 ENCOUNTER — Other Ambulatory Visit: Payer: Self-pay | Admitting: Family Medicine

## 2011-04-24 ENCOUNTER — Telehealth: Payer: Self-pay | Admitting: Family Medicine

## 2011-04-24 MED ORDER — HYDROCHLOROTHIAZIDE 25 MG PO TABS: 12.5000 mg | ORAL_TABLET | Freq: Every day | ORAL | Status: DC

## 2011-04-24 MED ORDER — ROSUVASTATIN CALCIUM 20 MG PO TABS: 20.0000 mg | ORAL_TABLET | Freq: Every day | ORAL | Status: DC

## 2011-04-24 NOTE — Telephone Encounter (Signed)
Needs  Refill Hydrochlorothiazide & crestor - [facility]

## 2011-04-24 NOTE — Telephone Encounter (Signed)
Patient has cpx scheduled [phone] - will be out of med needs refill till then

## 2011-04-24 NOTE — Telephone Encounter (Signed)
Per patient HCTZ and Crestor..prescription faxed     KP

## 2011-04-24 NOTE — Telephone Encounter (Signed)
What meds need to be filled    KP

## 2011-04-25 NOTE — Op Note (Signed)
[facility]  Patient:    Cindy Velasquez, Cindy Velasquez Visit Number: [id number] MRN: [id number]          Service Type: DSU Location: [facility] Attending Physician:  Marcene Corning Proc. Date: 08/10/01 Admit Date:  08/10/2001                             Operative Report  PREOPERATIVE DIAGNOSIS:  Left shoulder impingement.  POSTOPERATIVE DIAGNOSIS:  Left shoulder impingement.  PROCEDURES: 1. Left shoulder arthroscopic acromioplasty. 2. Left shoulder arthroscopic partial clavicectomy.  ANESTHESIA:  Regional block and MAC.  SURGEON:  Lubertha Basque. Jerl Santos, M.D.  ASSISTANT:  Prince Rome, P.A.  INDICATION FOR PROCEDURE:  The patient is a 70 year old woman with a very long history of left shoulder pain.  This has persisted despite the use of oral anti-inflammatories and several subacromial injections, each of which afforded her transient relief.  She also has performed some exercise program as well. At this point she is offered operative intervention that consists of an arthroscopy.  The procedure was discussed with the patient, and informed operative consent was obtained after discussion of the possible complications of, reaction to anesthesia, and infection.  She is actually status post a failed attempt at this procedure a month or two ago ,when she could not be intubated.  At this point she is scheduled for regional anesthesia.  DESCRIPTION OF PROCEDURE:  The patient was taken to the operating suite, where regional anesthesia was applied.  She was positioned in beach chair position and prepped and draped in the normal sterile fashion.  After administration of preoperative antibiotics, an arthroscopy of the left shoulder was performed through a total of two portals.  Glenohumeral joint showed no degenerative changes, and the biceps tendon was well attached.  All labral structures were well attached, and the rotator cuff appeared benign on  undersurface inspection.  In the subacromial space she had some bursitis and some fraying of the rotator cuff, but no partial or full-thickness tear was seen.  She had a prominent subacromial spur, which was addressed with a decompression back to a flat surface.  This was done with the bur in the lateral position, followed by transfer of the bur to the posterior position.  She also had a prominence of the distal clavicle, which was addressed with a partial clavicectomy without a formal AC decompression.  The shoulder was thoroughly irrigated, followed by placement of Marcaine with epinephrine and morphine.  Simple sutures of nylon were used to reapproximate her portals, followed by Adaptic and dry gauze dressing with tape.  Estimated blood loss and intraoperative fluids can be obtained from the anesthesia records.  DISPOSITION:  The patient was taken to recovery room in stable condition. Plans were for her to go home the same day and follow up in the office in less than a week.  I will contact her by phone tonight. Attending Physician:  Marcene Corning DD:  08/10/01 TD:  08/10/01 Job: [id number]

## 2011-05-23 ENCOUNTER — Encounter: Payer: Self-pay | Admitting: Family Medicine

## 2011-05-29 ENCOUNTER — Encounter: Payer: Self-pay | Admitting: Family Medicine

## 2011-06-02 ENCOUNTER — Ambulatory Visit (INDEPENDENT_AMBULATORY_CARE_PROVIDER_SITE_OTHER): Payer: Medicare PPO | Admitting: Family Medicine

## 2011-06-02 ENCOUNTER — Encounter: Payer: Self-pay | Admitting: Family Medicine

## 2011-06-02 VITALS — BP 149/83 | HR 75 | Temp 97.0°F | Ht 61.0 in | Wt 164.6 lb

## 2011-06-02 DIAGNOSIS — Z136 Encounter for screening for cardiovascular disorders: Secondary | ICD-10-CM

## 2011-06-02 DIAGNOSIS — E785 Hyperlipidemia, unspecified: Secondary | ICD-10-CM

## 2011-06-02 DIAGNOSIS — Z780 Asymptomatic menopausal state: Secondary | ICD-10-CM

## 2011-06-02 DIAGNOSIS — Z0000 Encounter for general adult medical examination without abnormal findings: Secondary | ICD-10-CM

## 2011-06-02 LAB — CBC WITH DIFFERENTIAL/PLATELET
Basophils Absolute: 0.1 10*3/uL (ref 0.0–0.1)
Eosinophils Relative: 6.9 % — ABNORMAL HIGH (ref 0.0–5.0)
HCT: 42.4 % (ref 36.0–46.0)
Hemoglobin: 14.3 g/dL (ref 12.0–15.0)
Lymphocytes Relative: 33.1 % (ref 12.0–46.0)
Lymphs Abs: 2.1 10*3/uL (ref 0.7–4.0)
Monocytes Relative: 10.8 % (ref 3.0–12.0)
Neutro Abs: 3.1 10*3/uL (ref 1.4–7.7)
Platelets: 250 10*3/uL (ref 150.0–400.0)
RDW: 14.8 % — ABNORMAL HIGH (ref 11.5–14.6)
WBC: 6.4 10*3/uL (ref 4.5–10.5)

## 2011-06-02 LAB — HEPATIC FUNCTION PANEL
ALT: 40 U/L — ABNORMAL HIGH (ref 0–35)
AST: 36 U/L (ref 0–37)
Alkaline Phosphatase: 73 U/L (ref 39–117)
Total Bilirubin: 0.4 mg/dL (ref 0.3–1.2)

## 2011-06-02 LAB — LIPID PANEL
Cholesterol: 149 mg/dL (ref 0–200)
LDL Cholesterol: 76 mg/dL (ref 0–99)
VLDL: 22.4 mg/dL (ref 0.0–40.0)

## 2011-06-02 LAB — BASIC METABOLIC PANEL
Calcium: 9.1 mg/dL (ref 8.4–10.5)
GFR: 85.15 mL/min (ref >60.00)
Potassium: 4.3 mEq/L (ref 3.5–5.1)
Sodium: 140 mEq/L (ref 135–145)

## 2011-06-02 MED ORDER — ZOSTER VACCINE LIVE 19400 UNT/0.65ML ~~LOC~~ SOLR: 0.6500 mL | Freq: Once | SUBCUTANEOUS | Status: DC

## 2011-06-02 NOTE — Patient Instructions (Signed)

## 2011-06-02 NOTE — Progress Notes (Signed)
Subjective:     Cindy Velasquez is a 70 y.o. female and is here for a comprehensive physical exam. The patient reports no problems.  History   Social History  . Marital Status: Married    Spouse Name: N/A    Number of Children: N/A  . Years of Education: N/A   Occupational History  . 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 Smoking status: Former Smoker -- 0.5 packs/day for 30 years    Types: Cigarettes    Quit date: 06/02/1991  . Smokeless tobacco: Never Used  . Alcohol Use: 3.0 oz/week    5 Glasses of wine per week  . Drug Use: Not on file  . Sexually Active: Yes -- Female partner(s)   Other Topics Concern  . Not on file   Social History Narrative  . No narrative on file   Health Maintenance  Topic Date Due  . Tetanus/tdap  07/25/1960  . Mammogram  07/26/1991  . Zostavax  07/25/2001  . Influenza Vaccine  09/08/2011  . Colonoscopy  05/07/2020  . Pneumococcal Polysaccharide Vaccine Age 33 And Over  Completed    The following portions of the patient's history were reviewed and updated as appropriate: allergies, current medications, past family history, past medical history, past social history, past surgical history and problem list.  Review of Systems  Review of Systems  Constitutional: Negative for activity change, appetite change and fatigue.  HENT: Negative for hearing loss, congestion, tinnitus and ear discharge.  Dentist--q93m Eyes: Negative for visual disturbance (see optho q1y -- vision corrected to 20/20 with glasses).  Respiratory: Negative for cough, chest tightness and shortness of breath.   Cardiovascular: Negative for chest pain, palpitations and leg swelling.  Gastrointestinal: Negative for abdominal pain, diarrhea, constipation and abdominal distention.  Genitourinary: Negative for urgency, frequency, decreased urine volume and difficulty urinating.  Musculoskeletal: Negative for back pain, arthralgias and gait problem.    Skin: Negative for color change, pallor and rash.  Neurological: Negative for dizziness, light-headedness, numbness and headaches.  Hematological: Negative for adenopathy. Does not bruise/bleed easily.  Psychiatric/Behavioral: Negative for suicidal ideas, confusion, sleep disturbance, self-injury, dysphoric mood, decreased concentration and agitation.  Pt is able to read and write and can do all ADLs No risk for falling No abuse/ violence in home   optho--Dr Groat Dentist--Dr Marina Goodell   Objective:    BP 149/83  Pulse 75  Temp(Src) 97 F (36.1 C) (Oral)  Ht 5\' 1"  (1.549 m)  Wt 164 lb 9.6 oz (74.662 kg)  BMI 31.10 kg/m2  SpO2 96% General appearance: alert, cooperative, appears stated age and no distress Head: Normocephalic, without obvious abnormality, atraumatic Eyes: conjunctivae/corneas clear. PERRL, EOM's intact. Fundi benign. Ears: normal TM's and external ear canals both ears Nose: Nares normal. Septum midline. Mucosa normal. No drainage or sinus tenderness. Throat: lips, mucosa, and tongue normal; teeth and gums normal Neck: no adenopathy, no carotid bruit, no JVD, supple, symmetrical, trachea midline and thyroid not enlarged, symmetric, no tenderness/mass/nodules Lungs: clear to auscultation bilaterally Breasts: normal appearance, no masses or tenderness Heart: regular rate and rhythm, S1, S2 normal, no murmur, click, rub or gallop Abdomen: soft, non-tender; bowel sounds normal; no masses,  no organomegaly Extremities: extremities normal, atraumatic, no cyanosis or edema Pulses: 2+ and symmetric Skin: Skin color, texture, turgor normal. No rashes or lesions Lymph nodes: Cervical, supraclavicular, and axillary nodes normal. Neurologic: Grossly normal psych-- no depression, no anxiety  not suicidal    Assessment:  Healthy female exam.  Hyperlipidemia postmenopausal   Plan:    check labs  Check mammo Check bmd ghm utd  See After Visit Summary for Counseling  Recommendations

## 2011-06-12 ENCOUNTER — Other Ambulatory Visit: Payer: Self-pay | Admitting: Family Medicine

## 2011-07-03 ENCOUNTER — Telehealth: Payer: Self-pay

## 2011-07-03 NOTE — Telephone Encounter (Signed)
BMD shows patient has osteoporosis.    Per Dr.Lowne Patient can do Fosamax po or Reclast once a year or Prolia every 6 ms. Recheck BMD in 2 years.     KP

## 2011-07-04 NOTE — Telephone Encounter (Signed)
Left message on voicemail for patient to return call when available   

## 2011-07-10 NOTE — Telephone Encounter (Signed)
Fast busy signal      KP

## 2011-07-11 NOTE — Telephone Encounter (Signed)
Line still busy      KP

## 2011-07-14 NOTE — Telephone Encounter (Signed)
Discussed with patient and she wanted to get information about Prolia and Reclast and wanted me to verify benefits for both before she made a decision     KP

## 2011-07-17 ENCOUNTER — Encounter: Payer: Self-pay | Admitting: Family Medicine

## 2011-07-26 ENCOUNTER — Other Ambulatory Visit: Payer: Self-pay | Admitting: Family Medicine

## 2011-08-15 ENCOUNTER — Telehealth: Payer: Self-pay

## 2011-08-15 DIAGNOSIS — Z0000 Encounter for general adult medical examination without abnormal findings: Secondary | ICD-10-CM

## 2011-08-15 NOTE — Telephone Encounter (Signed)
Mssg left for patient to call the office--she has been approved for prolia      KP

## 2011-08-18 MED ORDER — RISEDRONATE SODIUM 150 MG PO TABS: 150.0000 mg | ORAL_TABLET | ORAL | Status: AC

## 2011-08-18 MED ORDER — ZOSTER VACCINE LIVE 19400 UNT/0.65ML ~~LOC~~ SOLR: 0.6500 mL | Freq: Once | SUBCUTANEOUS | Status: DC

## 2011-08-18 NOTE — Telephone Encounter (Signed)
Discussed with patient and she declined the Prolia, she stated she just wanted a pill that she can take Monthly instead. .Please advise    KP      Also lost Zostavax--reprinted Rx and mailed to patient    KP

## 2011-08-18 NOTE — Telephone Encounter (Signed)
actonel 150 mg  # 1  1 po q monthly   11 refills

## 2011-08-18 NOTE — Telephone Encounter (Signed)
Rx Faxed    KP 

## 2011-11-14 ENCOUNTER — Telehealth: Payer: Self-pay | Admitting: Family Medicine

## 2011-11-14 NOTE — Telephone Encounter (Signed)
Please advise      KP 

## 2011-11-14 NOTE — Telephone Encounter (Signed)
Patient ins Cindy Velasquez wants patient to change from crestor to simvastatin - she wants to know if dr L received that info

## 2011-11-14 NOTE — Telephone Encounter (Signed)
Simvastatin and crestor or not even close to being the same drug---i need to see formulary

## 2011-11-14 NOTE — Telephone Encounter (Signed)
Discussed with patient and she voiced understanding, she stated she would bring in the formulary   KP

## 2011-11-20 ENCOUNTER — Telehealth: Payer: Self-pay

## 2011-11-20 NOTE — Telephone Encounter (Signed)
Patient stated she did not want Lipitor at all. Please advise    KP

## 2011-11-20 NOTE — Telephone Encounter (Signed)
Msg from patient and she sated she was supposed to bring in a formulary . She stated that Lovastatin and Pravastatin are both on the formulary and she wanted to know if she can be switched to one of these. Please advise    KP

## 2011-11-20 NOTE — Telephone Encounter (Signed)
I would prefer lipitor or atorvastatin ----is that on there?

## 2011-11-21 MED ORDER — PRAVASTATIN SODIUM 40 MG PO TABS: 40.0000 mg | ORAL_TABLET | Freq: Every evening | ORAL | Status: DC

## 2011-11-21 NOTE — Telephone Encounter (Signed)
We can try one of those but they are much weaker---most likely will not get her to goal.   Pravastatin 40 mg #30  1 po qhs ,  2 refills

## 2011-11-21 NOTE — Telephone Encounter (Signed)
Discussed with patient and she voiced understanding. Rx for Pravachol 40 printed and she said she would pick it up on Monday.     KP

## 2011-12-01 ENCOUNTER — Other Ambulatory Visit: Payer: Self-pay

## 2011-12-01 MED ORDER — HYDROCHLOROTHIAZIDE 25 MG PO TABS: 12.5000 mg | ORAL_TABLET | Freq: Every day | ORAL | Status: DC

## 2012-01-14 ENCOUNTER — Other Ambulatory Visit: Payer: Self-pay | Admitting: Family Medicine

## 2012-02-23 ENCOUNTER — Other Ambulatory Visit: Payer: Medicare PPO

## 2012-02-25 ENCOUNTER — Other Ambulatory Visit: Payer: Self-pay

## 2012-02-25 MED ORDER — PRAVASTATIN SODIUM 40 MG PO TABS: 40.0000 mg | ORAL_TABLET | Freq: Every evening | ORAL | Status: DC

## 2012-02-25 NOTE — Telephone Encounter (Signed)
Msg from patient requesting an Rx refill on Pravachol. She is going to be out of town since her brother is having surgery and would like an Rx sent [facility]. I have scheduled her or repeat labs and a CPE.    Rx sent.     KP

## 2012-03-03 ENCOUNTER — Other Ambulatory Visit: Payer: Medicare PPO

## 2012-03-15 ENCOUNTER — Other Ambulatory Visit: Payer: Medicare PPO

## 2012-03-22 ENCOUNTER — Other Ambulatory Visit: Payer: Medicare PPO

## 2012-04-07 ENCOUNTER — Encounter: Payer: Self-pay | Admitting: Family Medicine

## 2012-04-07 ENCOUNTER — Other Ambulatory Visit (INDEPENDENT_AMBULATORY_CARE_PROVIDER_SITE_OTHER): Payer: Medicare PPO

## 2012-04-07 ENCOUNTER — Ambulatory Visit (INDEPENDENT_AMBULATORY_CARE_PROVIDER_SITE_OTHER): Payer: Medicare PPO | Admitting: Family Medicine

## 2012-04-07 VITALS — BP 118/78 | HR 75 | Temp 98.1°F | Ht 60.0 in | Wt 166.6 lb

## 2012-04-07 DIAGNOSIS — H6990 Unspecified Eustachian tube disorder, unspecified ear: Secondary | ICD-10-CM | POA: Insufficient documentation

## 2012-04-07 DIAGNOSIS — E785 Hyperlipidemia, unspecified: Secondary | ICD-10-CM

## 2012-04-07 DIAGNOSIS — H6980 Other specified disorders of Eustachian tube, unspecified ear: Secondary | ICD-10-CM | POA: Insufficient documentation

## 2012-04-07 DIAGNOSIS — H6120 Impacted cerumen, unspecified ear: Secondary | ICD-10-CM

## 2012-04-07 LAB — HEPATIC FUNCTION PANEL
ALT: 35 U/L (ref 0–35)
Bilirubin, Direct: 0 mg/dL (ref 0.0–0.3)
Total Bilirubin: 0.4 mg/dL (ref 0.3–1.2)

## 2012-04-07 LAB — LIPID PANEL
HDL: 46 mg/dL (ref >39.00)
LDL Cholesterol: 114 mg/dL — ABNORMAL HIGH (ref 0–99)
Total CHOL/HDL Ratio: 4
VLDL: 30.2 mg/dL (ref 0.0–40.0)

## 2012-04-07 NOTE — Progress Notes (Signed)
  Subjective:    Patient ID: Cindy Velasquez, female    DOB: 1941/05/19, 71 y.o.   MRN: [id number]  HPI Bilateral ear fullness- sxs noted x10 days.  Having some nasal congestion and PND due to pollen.  No cough.  No sinus pain/pressure.  No fever.   Review of Systems For ROS see HPI    Objective:   Physical Exam  Vitals reviewed. Constitutional: She appears well-developed and well-nourished. No distress.  HENT:  Head: Normocephalic and atraumatic.  Right Ear: Tympanic membrane is retracted.  Left Ear: Tympanic membrane is retracted.  Nose: Mucosal edema and rhinorrhea present. Right sinus exhibits no maxillary sinus tenderness and no frontal sinus tenderness. Left sinus exhibits no maxillary sinus tenderness and no frontal sinus tenderness.  Mouth/Throat: Mucous membranes are normal. Posterior oropharyngeal erythema (w/ PND) present.       R ear w/ cerumen impaction, successfully irrigated, TM retracted  Eyes: Conjunctivae and EOM are normal. Pupils are equal, round, and reactive to light.  Neck: Normal range of motion. Neck supple.  Cardiovascular: Normal rate, regular rhythm and normal heart sounds.   Pulmonary/Chest: Effort normal and breath sounds normal. No respiratory distress. She has no wheezes. She has no rales.  Lymphadenopathy:    She has no cervical adenopathy.          Assessment & Plan:

## 2012-04-07 NOTE — Patient Instructions (Signed)
Your ears are full due to eustachian tube dysfunction (nasal congestion that blocks the ear) Start Coricidin HBP (over the counter decongestant) for 3-5 days to improve the congestion and open the ear Drink plenty of fluids Call with any questions or concerns Hang in there!!!

## 2012-04-09 NOTE — Assessment & Plan Note (Signed)
R ear.  Successfully irrigated.

## 2012-04-09 NOTE — Assessment & Plan Note (Signed)
New.  Reviewed dx and tx w/ pt.  Reviewed supportive care and red flags that should prompt return.  Pt expressed understanding and is in agreement w/ plan.

## 2012-04-13 MED ORDER — ROSUVASTATIN CALCIUM 20 MG PO TABS: 20.0000 mg | ORAL_TABLET | Freq: Every day | ORAL | Status: DC

## 2012-06-16 ENCOUNTER — Encounter: Payer: Medicare PPO | Admitting: Family Medicine

## 2012-06-22 ENCOUNTER — Ambulatory Visit (INDEPENDENT_AMBULATORY_CARE_PROVIDER_SITE_OTHER): Payer: Medicare PPO | Admitting: Family Medicine

## 2012-06-22 ENCOUNTER — Encounter: Payer: Self-pay | Admitting: Family Medicine

## 2012-06-22 VITALS — BP 120/70 | HR 77 | Temp 98.1°F | Ht 60.5 in | Wt 163.2 lb

## 2012-06-22 DIAGNOSIS — E785 Hyperlipidemia, unspecified: Secondary | ICD-10-CM

## 2012-06-22 DIAGNOSIS — I10 Essential (primary) hypertension: Secondary | ICD-10-CM

## 2012-06-22 DIAGNOSIS — R319 Hematuria, unspecified: Secondary | ICD-10-CM

## 2012-06-22 DIAGNOSIS — K5730 Diverticulosis of large intestine without perforation or abscess without bleeding: Secondary | ICD-10-CM

## 2012-06-22 DIAGNOSIS — Z1239 Encounter for other screening for malignant neoplasm of breast: Secondary | ICD-10-CM

## 2012-06-22 DIAGNOSIS — B356 Tinea cruris: Secondary | ICD-10-CM | POA: Insufficient documentation

## 2012-06-22 DIAGNOSIS — Z0000 Encounter for general adult medical examination without abnormal findings: Secondary | ICD-10-CM

## 2012-06-22 DIAGNOSIS — K5790 Diverticulosis of intestine, part unspecified, without perforation or abscess without bleeding: Secondary | ICD-10-CM

## 2012-06-22 DIAGNOSIS — L719 Rosacea, unspecified: Secondary | ICD-10-CM

## 2012-06-22 LAB — BASIC METABOLIC PANEL
BUN: 22 mg/dL (ref 6–23)
CO2: 31 mEq/L (ref 19–32)
Calcium: 9.2 mg/dL (ref 8.4–10.5)
Chloride: 102 mEq/L (ref 96–112)
Creatinine, Ser: 0.7 mg/dL (ref 0.4–1.2)
Glucose, Bld: 94 mg/dL (ref 70–99)

## 2012-06-22 LAB — HEPATIC FUNCTION PANEL
Alkaline Phosphatase: 65 U/L (ref 39–117)
Bilirubin, Direct: 0 mg/dL (ref 0.0–0.3)
Total Protein: 7.5 g/dL (ref 6.0–8.3)

## 2012-06-22 LAB — LIPID PANEL
HDL: 54.1 mg/dL (ref >39.00)
Total CHOL/HDL Ratio: 3
Triglycerides: 130 mg/dL (ref 0.0–149.0)
VLDL: 26 mg/dL (ref 0.0–40.0)

## 2012-06-22 LAB — CBC WITH DIFFERENTIAL/PLATELET
Basophils Absolute: 0.1 10*3/uL (ref 0.0–0.1)
Basophils Relative: 1.1 % (ref 0.0–3.0)
Eosinophils Absolute: 0.2 10*3/uL (ref 0.0–0.7)
Lymphocytes Relative: 30.3 % (ref 12.0–46.0)
MCHC: 32.9 g/dL (ref 30.0–36.0)
MCV: 91.3 fl (ref 78.0–100.0)
Monocytes Absolute: 0.6 10*3/uL (ref 0.1–1.0)
Neutrophils Relative %: 55.2 % (ref 43.0–77.0)
Platelets: 271 10*3/uL (ref 150.0–400.0)
RDW: 14.8 % — ABNORMAL HIGH (ref 11.5–14.6)

## 2012-06-22 LAB — POCT URINALYSIS DIPSTICK
Bilirubin, UA: NEGATIVE
Ketones, UA: NEGATIVE
Nitrite, UA: NEGATIVE
Protein, UA: NEGATIVE
pH, UA: 6

## 2012-06-22 MED ORDER — NONFORMULARY OR COMPOUNDED ITEM: Status: DC

## 2012-06-22 MED ORDER — CLOTRIMAZOLE-BETAMETHASONE 1-0.05 % EX CREA: TOPICAL_CREAM | Freq: Two times a day (BID) | CUTANEOUS | Status: AC

## 2012-06-22 MED ORDER — METRONIDAZOLE 0.75 % EX LOTN: TOPICAL_LOTION | CUTANEOUS | Status: DC

## 2012-06-22 NOTE — Assessment & Plan Note (Signed)
Check labs con't meds 

## 2012-06-22 NOTE — Progress Notes (Signed)
Subjective:    Cindy Velasquez is a 71 y.o. female who presents for Medicare Annual/Subsequent preventive examination.  Preventive Screening-Counseling & Management  Tobacco History  Smoking status  . Former Smoker -- 0.5 packs/day for 30 years  . Types: Cigarettes  . Quit date: 06/02/1991  Smokeless tobacco  . Never Used     Problems Prior to Visit 1.   rosecea--  Red spots on face 2.  Rash in groin-- tried otc with no relief  Current Problems (verified) Patient Active Problem List  Diagnosis  . BENIGN NEOPLASM OF SKIN SITE UNSPECIFIED  . HYPERLIPIDEMIA  . HYPERTENSION  . SINUSITIS - ACUTE-NOS  . URI  . GERD  . DYSPEPSIA  . DIVERTICULOSIS, COLON  . GUAIAC POSITIVE STOOL  . HEMATURIA UNSPECIFIED  . BACK PAIN, LUMBAR  . THUMB PAIN, RIGHT  . OSTEOPOROSIS  . HAMMER TOE  . LOOSE STOOLS  . CHANGE IN BOWELS  . DYSURIA  . FECAL OCCULT BLOOD  . PERSONAL HX COLONIC POLYPS  . POSTMENOPAUSAL STATUS  . Eustachian tube dysfunction  . Cerumen impaction    Medications Prior to Visit Current Outpatient Prescriptions on File Prior to Visit  Medication Sig Dispense Refill  . aspirin 81 MG tablet Take 81 mg by mouth daily.        . Calcium Carbonate-Vitamin D (CALTRATE 600+D) 600-400 MG-UNIT per tablet Take 1 tablet by mouth daily.        . diphenhydramine-acetaminophen (TYLENOL PM) 25-500 MG TABS Take 1 tablet by mouth at bedtime as needed.        . hydrochlorothiazide (HYDRODIURIL) 25 MG tablet Take 0.5 tablets (12.5 mg total) by mouth daily.  45 tablet  1  . rosuvastatin (CRESTOR) 20 MG tablet Take 1 tablet (20 mg total) by mouth daily.  30 tablet  2  . Cholecalciferol (VITAMIN D) 2000 UNITS tablet Take 2,000 Units by mouth daily.        . hydrochlorothiazide (HYDRODIURIL) 25 MG tablet take 1/2 tablet by mouth once daily  45 tablet  0  . pravastatin (PRAVACHOL) 40 MG tablet Take 1 tablet (40 mg total) by mouth every evening.  90 tablet  0  . risedronate (ACTONEL) 150 MG  tablet Take 1 tablet (150 mg total) by mouth every 30 (thirty) days. with water on empty stomach, nothing by mouth or lie down for next 30 minutes.  1 tablet  11    Current Medications (verified) Current Outpatient Prescriptions  Medication Sig Dispense Refill  . aspirin 81 MG tablet Take 81 mg by mouth daily.        . Calcium Carbonate-Vitamin D (CALTRATE 600+D) 600-400 MG-UNIT per tablet Take 1 tablet by mouth daily.        . diphenhydramine-acetaminophen (TYLENOL PM) 25-500 MG TABS Take 1 tablet by mouth at bedtime as needed.        . hydrochlorothiazide (HYDRODIURIL) 25 MG tablet Take 0.5 tablets (12.5 mg total) by mouth daily.  45 tablet  1  . rosuvastatin (CRESTOR) 20 MG tablet Take 1 tablet (20 mg total) by mouth daily.  30 tablet  2  . Cholecalciferol (VITAMIN D) 2000 UNITS tablet Take 2,000 Units by mouth daily.        . hydrochlorothiazide (HYDRODIURIL) 25 MG tablet take 1/2 tablet by mouth once daily  45 tablet  0  . NONFORMULARY OR COMPOUNDED ITEM Wheat grass 1 scoop mixed in water daily      . pravastatin (PRAVACHOL) 40 MG tablet Take 1  tablet (40 mg total) by mouth every evening.  90 tablet  0  . risedronate (ACTONEL) 150 MG tablet Take 1 tablet (150 mg total) by mouth every 30 (thirty) days. with water on empty stomach, nothing by mouth or lie down for next 30 minutes.  1 tablet  11     Allergies (verified) Review of patient's allergies indicates no known allergies.   PAST HISTORY  Family History Family History  Problem Relation Age of Onset  . Hypertension Mother   . Cancer Mother 77    ?  Marland Kitchen Hypertension Father   . Stroke Father   . Hypertension Brother   . Cancer Brother 35    testicular   . Hypertension Brother   . Heart disease Brother     quadruple bypass    Social History History  Substance Use Topics  . Smoking status: Former Smoker -- 0.5 packs/day for 30 years    Types: Cigarettes    Quit date: 06/02/1991  . Smokeless tobacco: Never Used  .  Alcohol Use: 3.0 oz/week    5 Glasses of wine per week     Are there smokers in your home (other than you)? No  Risk Factors Current exercise habits: Gym/ health club routine includes cardio, walking on track  and yoga.  Dietary issues discussed: na   Cardiac risk factors: advanced age (older than 59 for men, 82 for women), dyslipidemia and hypertension.  Depression Screen (Note: if answer to either of the following is "Yes", a more complete depression screening is indicated)   Over the past two weeks, have you felt down, depressed or hopeless? No  Over the past two weeks, have you felt little interest or pleasure in doing things? No  Have you lost interest or pleasure in daily life? No  Do you often feel hopeless? No  Do you cry easily over simple problems? No  Activities of Daily Living In your present state of health, do you have any difficulty performing the following activities?:  Driving? No Managing money?  No Feeding yourself? No Getting from bed to chair? No Climbing a flight of stairs? No Preparing food and eating?: No Bathing or showering? No Getting dressed: No Getting to the toilet? No Using the toilet:No Moving around from place to place: No In the past year have you fallen or had a near fall?:No   Are you sexually active?  No  Do you have more than one partner?  No  Hearing Difficulties: No Do you often ask people to speak up or repeat themselves? No Do you experience ringing or noises in your ears? No Do you have difficulty understanding soft or whispered voices? No   Do you feel that you have a problem with memory? No  Do you often misplace items? No  Do you feel safe at home?  Yes  Cognitive Testing  Alert? Yes  Normal Appearance?Yes  Oriented to person? Yes  Place? Yes   Time? Yes  Recall of three objects?  Yes  Can perform simple calculations? Yes  Displays appropriate judgment?Yes  Can read the correct time from a watch face?Yes   Advanced  Directives have been discussed with the patient? Yes  List the Names of Other Physician/Practitioners you currently use: 1.  [facility] 2. Dr Marina Goodell-- dentist Indicate any recent Medical Services you may have received from other than [facility] providers in the past year (date may be approximate).  Immunization History  Administered Date(s) Administered  . H1N1 12/28/2008  .  Influenza Whole 10/20/2007, 10/18/2008  . Pneumococcal Polysaccharide 12/28/2008    Screening Tests Health Maintenance  Topic Date Due  . Tetanus/tdap  07/25/1960  . Mammogram  07/01/2012  . Influenza Vaccine  09/07/2012  . Colonoscopy  05/07/2020  . Pneumococcal Polysaccharide Vaccine Age 70 And Over  Completed  . Zostavax  Completed    All answers were reviewed with the patient and necessary referrals were made:  Loreen Freud, DO   06/22/2012   History reviewed: allergies, current medications, past family history, past medical history, past social history, past surgical history and problem list  Review of Systems  Review of Systems  Constitutional: Negative for activity change, appetite change and fatigue.  HENT: Negative for hearing loss, congestion, tinnitus and ear discharge.  dentist q3 m Eyes: Negative for visual disturbance (see [facility] q1y -- vision corrected to 20/20 with glasses).  Respiratory: Negative for cough, chest tightness and shortness of breath.   Cardiovascular: Negative for chest pain, palpitations and leg swelling.  Gastrointestinal: Negative for abdominal pain, diarrhea, constipation and abdominal distention.  Genitourinary: Negative for urgency, frequency, decreased urine volume and difficulty urinating.  Musculoskeletal: Negative for back pain, arthralgias and gait problem.  Skin: Negative for color change, pallor and rash.  Neurological: Negative for dizziness, light-headedness, numbness and headaches.  Hematological: Negative for adenopathy. Does not bruise/bleed easily.    Psychiatric/Behavioral: Negative for suicidal ideas, confusion, sleep disturbance, self-injury, dysphoric mood, decreased concentration and agitation.  Pt is able to read and write and can do all ADLs No risk for falling No abuse/ violence in home        Objective:     Vision by Snellen chart: opth  Body mass index is 31.35 kg/(m^2). BP 120/70  Pulse 77  Temp 98.1 F (36.7 C) (Oral)  Ht 5' 0.5" (1.537 m)  Wt 163 lb 3.2 oz (74.027 kg)  BMI 31.35 kg/m2  SpO2 96%  BP 120/70  Pulse 77  Temp 98.1 F (36.7 C) (Oral)  Ht 5' 0.5" (1.537 m)  Wt 163 lb 3.2 oz (74.027 kg)  BMI 31.35 kg/m2  SpO2 96% General appearance: alert, cooperative, appears stated age and no distress Head: Normocephalic, without obvious abnormality, atraumatic Eyes: negative findings: lids and lashes normal, conjunctivae and sclerae normal, corneas clear and pupils equal, round, reactive to light and accomodation Ears: normal TM's and external ear canals both ears Nose: Nares normal. Septum midline. Mucosa normal. No drainage or sinus tenderness. Throat: lips, mucosa, and tongue normal; teeth and gums normal Neck: no adenopathy, no carotid bruit, no JVD, supple, symmetrical, trachea midline and thyroid not enlarged, symmetric, no tenderness/mass/nodules Back: symmetric, no curvature. ROM normal. No CVA tenderness. Lungs: clear to auscultation bilaterally Breasts: normal appearance, no masses or tenderness Heart: regular rate and rhythm, S1, S2 normal, no murmur, click, rub or gallop Abdomen: soft, non-tender; bowel sounds normal; no masses,  no organomegaly Pelvic: not indicated; post-menopausal, no abnormal Pap smears in past Extremities: extremities normal, atraumatic, no cyanosis or edema Pulses: 2+ and symmetric Skin-- skin in groin, cracked and tender. red Skin: + red spots c/w rosecea and tinea in groin--red cracked skin Lymph nodes: Cervical, supraclavicular, and axillary nodes normal. Neurologic:  Alert and oriented X 3, normal strength and tone. Normal symmetric reflexes. Normal coordination and gait Psych-- no anxiety or depression     Assessment:     cpe     Plan:    ghm utd Check labs During the course of the visit the patient was  educated and counseled about appropriate screening and preventive services including:    Influenza vaccine  Td vaccine  Screening mammography  Screening Pap smear and pelvic exam   Bone densitometry screening  Colorectal cancer screening  Diabetes screening  Glaucoma screening  Advanced directives: has an advanced directive - a copy HAS NOT been provided.  Diet review for nutrition referral? Yes ____  Not Indicated __x__   Patient Instructions (the written plan) was given to the patient.  Medicare Attestation I have personally reviewed: The patient's medical and social history Their use of alcohol, tobacco or illicit drugs Their current medications and supplements The patient's functional ability including ADLs,fall risks, home safety risks, cognitive, and hearing and visual impairment Diet and physical activities Evidence for depression or mood disorders  The patient's weight, height, BMI, and visual acuity have been recorded in the chart.  I have made referrals, counseling, and provided education to the patient based on review of the above and I have provided the patient with a written personalized care plan for preventive services.     Loreen Freud, DO   06/22/2012

## 2012-06-22 NOTE — Assessment & Plan Note (Signed)
lotrisone rto prn

## 2012-06-22 NOTE — Assessment & Plan Note (Signed)
metrolotion To derm if no better

## 2012-06-22 NOTE — Addendum Note (Signed)
Addended by: Silvio Pate D on: 06/22/2012 01:56 PM   Modules accepted: Orders

## 2012-06-22 NOTE — Patient Instructions (Addendum)
Preventive Care for Adults, Female A healthy lifestyle and preventive care can promote health and wellness. Preventive health guidelines for women include the following key practices.  A routine yearly physical is a good way to check with your caregiver about your health and preventive screening. It is a chance to share any concerns and updates on your health, and to receive a thorough exam.   Visit your dentist for a routine exam and preventive care every 6 months. Brush your teeth twice a day and floss once a day. Good oral hygiene prevents tooth decay and gum disease.   The frequency of eye exams is based on your age, health, family medical history, use of contact lenses, and other factors. Follow your caregiver's recommendations for frequency of eye exams.   Eat a healthy diet. Foods like vegetables, fruits, whole grains, low-fat dairy products, and lean protein foods contain the nutrients you need without too many calories. Decrease your intake of foods high in solid fats, added sugars, and salt. Eat the right amount of calories for you.Get information about a proper diet from your caregiver, if necessary.   Regular physical exercise is one of the most important things you can do for your health. Most adults should get at least 150 minutes of moderate-intensity exercise (any activity that increases your heart rate and causes you to sweat) each week. In addition, most adults need muscle-strengthening exercises on 2 or more days a week.   Maintain a healthy weight. The body mass index (BMI) is a screening tool to identify possible weight problems. It provides an estimate of body fat based on height and weight. Your caregiver can help determine your BMI, and can help you achieve or maintain a healthy weight.For adults 20 years and older:   A BMI below 18.5 is considered underweight.   A BMI of 18.5 to 24.9 is normal.   A BMI of 25 to 29.9 is considered overweight.   A BMI of 30 and above is  considered obese.   Maintain normal blood lipids and cholesterol levels by exercising and minimizing your intake of saturated fat. Eat a balanced diet with plenty of fruit and vegetables. Blood tests for lipids and cholesterol should begin at age 20 and be repeated every 5 years. If your lipid or cholesterol levels are high, you are over 50, or you are at high risk for heart disease, you may need your cholesterol levels checked more frequently.Ongoing high lipid and cholesterol levels should be treated with medicines if diet and exercise are not effective.   If you smoke, find out from your caregiver how to quit. If you do not use tobacco, do not start.   If you are pregnant, do not drink alcohol. If you are breastfeeding, be very cautious about drinking alcohol. If you are not pregnant and choose to drink alcohol, do not exceed 1 drink per day. One drink is considered to be 12 ounces (355 mL) of beer, 5 ounces (148 mL) of wine, or 1.5 ounces (44 mL) of liquor.   Avoid use of street drugs. Do not share needles with anyone. Ask for help if you need support or instructions about stopping the use of drugs.   High blood pressure causes heart disease and increases the risk of stroke. Your blood pressure should be checked at least every 1 to 2 years. Ongoing high blood pressure should be treated with medicines if weight loss and exercise are not effective.   If you are 55 to 71   years old, ask your caregiver if you should take aspirin to prevent strokes.   Diabetes screening involves taking a blood sample to check your fasting blood sugar level. This should be done once every 3 years, after age 45, if you are within normal weight and without risk factors for diabetes. Testing should be considered at a younger age or be carried out more frequently if you are overweight and have at least 1 risk factor for diabetes.   Breast cancer screening is essential preventive care for women. You should practice "breast  self-awareness." This means understanding the normal appearance and feel of your breasts and may include breast self-examination. Any changes detected, no matter how small, should be reported to a caregiver. Women in their 20s and 30s should have a clinical breast exam (CBE) by a caregiver as part of a regular health exam every 1 to 3 years. After age 40, women should have a CBE every year. Starting at age 40, women should consider having a mammography (breast X-ray test) every year. Women who have a family history of breast cancer should talk to their caregiver about genetic screening. Women at a high risk of breast cancer should talk to their caregivers about having magnetic resonance imaging (MRI) and a mammography every year.   The Pap test is a screening test for cervical cancer. A Pap test can show cell changes on the cervix that might become cervical cancer if left untreated. A Pap test is a procedure in which cells are obtained and examined from the lower end of the uterus (cervix).   Women should have a Pap test starting at age 21.   Between ages 21 and 29, Pap tests should be repeated every 2 years.   Beginning at age 30, you should have a Pap test every 3 years as long as the past 3 Pap tests have been normal.   Some women have medical problems that increase the chance of getting cervical cancer. Talk to your caregiver about these problems. It is especially important to talk to your caregiver if a new problem develops soon after your last Pap test. In these cases, your caregiver may recommend more frequent screening and Pap tests.   The above recommendations are the same for women who have or have not gotten the vaccine for human papillomavirus (HPV).   If you had a hysterectomy for a problem that was not cancer or a condition that could lead to cancer, then you no longer need Pap tests. Even if you no longer need a Pap test, a regular exam is a good idea to make sure no other problems are  starting.   If you are between ages 65 and 70, and you have had normal Pap tests going back 10 years, you no longer need Pap tests. Even if you no longer need a Pap test, a regular exam is a good idea to make sure no other problems are starting.   If you have had past treatment for cervical cancer or a condition that could lead to cancer, you need Pap tests and screening for cancer for at least 20 years after your treatment.   If Pap tests have been discontinued, risk factors (such as a new sexual partner) need to be reassessed to determine if screening should be resumed.   The HPV test is an additional test that may be used for cervical cancer screening. The HPV test looks for the virus that can cause the cell changes on the cervix.   The cells collected during the Pap test can be tested for HPV. The HPV test could be used to screen women aged 30 years and older, and should be used in women of any age who have unclear Pap test results. After the age of 30, women should have HPV testing at the same frequency as a Pap test.   Colorectal cancer can be detected and often prevented. Most routine colorectal cancer screening begins at the age of 50 and continues through age 75. However, your caregiver may recommend screening at an earlier age if you have risk factors for colon cancer. On a yearly basis, your caregiver may provide home test kits to check for hidden blood in the stool. Use of a small camera at the end of a tube, to directly examine the colon (sigmoidoscopy or colonoscopy), can detect the earliest forms of colorectal cancer. Talk to your caregiver about this at age 50, when routine screening begins. Direct examination of the colon should be repeated every 5 to 10 years through age 75, unless early forms of pre-cancerous polyps or small growths are found.   Hepatitis C blood testing is recommended for all people born from 1945 through 1965 and any individual with known risks for hepatitis C.    Practice safe sex. Use condoms and avoid high-risk sexual practices to reduce the spread of sexually transmitted infections (STIs). STIs include gonorrhea, chlamydia, syphilis, trichomonas, herpes, HPV, and human immunodeficiency virus (HIV). Herpes, HIV, and HPV are viral illnesses that have no cure. They can result in disability, cancer, and death. Sexually active women aged 25 and younger should be checked for chlamydia. Older women with new or multiple partners should also be tested for chlamydia. Testing for other STIs is recommended if you are sexually active and at increased risk.   Osteoporosis is a disease in which the bones lose minerals and strength with aging. This can result in serious bone fractures. The risk of osteoporosis can be identified using a bone density scan. Women ages 65 and over and women at risk for fractures or osteoporosis should discuss screening with their caregivers. Ask your caregiver whether you should take a calcium supplement or vitamin D to reduce the rate of osteoporosis.   Menopause can be associated with physical symptoms and risks. Hormone replacement therapy is available to decrease symptoms and risks. You should talk to your caregiver about whether hormone replacement therapy is right for you.   Use sunscreen with sun protection factor (SPF) of 30 or more. Apply sunscreen liberally and repeatedly throughout the day. You should seek shade when your shadow is shorter than you. Protect yourself by wearing long sleeves, pants, a wide-brimmed hat, and sunglasses year round, whenever you are outdoors.   Once a month, do a whole body skin exam, using a mirror to look at the skin on your back. Notify your caregiver of new moles, moles that have irregular borders, moles that are larger than a pencil eraser, or moles that have changed in shape or color.   Stay current with required immunizations.   Influenza. You need a dose every fall (or winter). The composition of  the flu vaccine changes each year, so being vaccinated once is not enough.   Pneumococcal polysaccharide. You need 1 to 2 doses if you smoke cigarettes or if you have certain chronic medical conditions. You need 1 dose at age 65 (or older) if you have never been vaccinated.   Tetanus, diphtheria, pertussis (Tdap, Td). Get 1 dose of   Tdap vaccine if you are younger than age 65, are over 65 and have contact with an infant, are a healthcare worker, are pregnant, or simply want to be protected from whooping cough. After that, you need a Td booster dose every 10 years. Consult your caregiver if you have not had at least 3 tetanus and diphtheria-containing shots sometime in your life or have a deep or dirty wound.   HPV. You need this vaccine if you are a woman age 26 or younger. The vaccine is given in 3 doses over 6 months.   Measles, mumps, rubella (MMR). You need at least 1 dose of MMR if you were born in 1957 or later. You may also need a second dose.   Meningococcal. If you are age 19 to 21 and a first-year college student living in a residence hall, or have one of several medical conditions, you need to get vaccinated against meningococcal disease. You may also need additional booster doses.   Zoster (shingles). If you are age 60 or older, you should get this vaccine.   Varicella (chickenpox). If you have never had chickenpox or you were vaccinated but received only 1 dose, talk to your caregiver to find out if you need this vaccine.   Hepatitis A. You need this vaccine if you have a specific risk factor for hepatitis A virus infection or you simply wish to be protected from this disease. The vaccine is usually given as 2 doses, 6 to 18 months apart.   Hepatitis B. You need this vaccine if you have a specific risk factor for hepatitis B virus infection or you simply wish to be protected from this disease. The vaccine is given in 3 doses, usually over 6 months.  Preventive Services /  Frequency Ages 19 to 39  Blood pressure check.** / Every 1 to 2 years.   Lipid and cholesterol check.** / Every 5 years beginning at age 20.   Clinical breast exam.** / Every 3 years for women in their 20s and 30s.   Pap test.** / Every 2 years from ages 21 through 29. Every 3 years starting at age 30 through age 65 or 70 with a history of 3 consecutive normal Pap tests.   HPV screening.** / Every 3 years from ages 30 through ages 65 to 70 with a history of 3 consecutive normal Pap tests.   Hepatitis C blood test.** / For any individual with known risks for hepatitis C.   Skin self-exam. / Monthly.   Influenza immunization.** / Every year.   Pneumococcal polysaccharide immunization.** / 1 to 2 doses if you smoke cigarettes or if you have certain chronic medical conditions.   Tetanus, diphtheria, pertussis (Tdap, Td) immunization. / A one-time dose of Tdap vaccine. After that, you need a Td booster dose every 10 years.   HPV immunization. / 3 doses over 6 months, if you are 26 and younger.   Measles, mumps, rubella (MMR) immunization. / You need at least 1 dose of MMR if you were born in 1957 or later. You may also need a second dose.   Meningococcal immunization. / 1 dose if you are age 19 to 21 and a first-year college student living in a residence hall, or have one of several medical conditions, you need to get vaccinated against meningococcal disease. You may also need additional booster doses.   Varicella immunization.** / Consult your caregiver.   Hepatitis A immunization.** / Consult your caregiver. 2 doses, 6 to 18 months   apart.   Hepatitis B immunization.** / Consult your caregiver. 3 doses usually over 6 months.  Ages 40 to 64  Blood pressure check.** / Every 1 to 2 years.   Lipid and cholesterol check.** / Every 5 years beginning at age 20.   Clinical breast exam.** / Every year after age 40.   Mammogram.** / Every year beginning at age 40 and continuing for as  long as you are in good health. Consult with your caregiver.   Pap test.** / Every 3 years starting at age 30 through age 65 or 70 with a history of 3 consecutive normal Pap tests.   HPV screening.** / Every 3 years from ages 30 through ages 65 to 70 with a history of 3 consecutive normal Pap tests.   Fecal occult blood test (FOBT) of stool. / Every year beginning at age 50 and continuing until age 75. You may not need to do this test if you get a colonoscopy every 10 years.   Flexible sigmoidoscopy or colonoscopy.** / Every 5 years for a flexible sigmoidoscopy or every 10 years for a colonoscopy beginning at age 50 and continuing until age 75.   Hepatitis C blood test.** / For all people born from 1945 through 1965 and any individual with known risks for hepatitis C.   Skin self-exam. / Monthly.   Influenza immunization.** / Every year.   Pneumococcal polysaccharide immunization.** / 1 to 2 doses if you smoke cigarettes or if you have certain chronic medical conditions.   Tetanus, diphtheria, pertussis (Tdap, Td) immunization.** / A one-time dose of Tdap vaccine. After that, you need a Td booster dose every 10 years.   Measles, mumps, rubella (MMR) immunization. / You need at least 1 dose of MMR if you were born in 1957 or later. You may also need a second dose.   Varicella immunization.** / Consult your caregiver.   Meningococcal immunization.** / Consult your caregiver.   Hepatitis A immunization.** / Consult your caregiver. 2 doses, 6 to 18 months apart.   Hepatitis B immunization.** / Consult your caregiver. 3 doses, usually over 6 months.  Ages 65 and over  Blood pressure check.** / Every 1 to 2 years.   Lipid and cholesterol check.** / Every 5 years beginning at age 20.   Clinical breast exam.** / Every year after age 40.   Mammogram.** / Every year beginning at age 40 and continuing for as long as you are in good health. Consult with your caregiver.   Pap test.** /  Every 3 years starting at age 30 through age 65 or 70 with a 3 consecutive normal Pap tests. Testing can be stopped between 65 and 70 with 3 consecutive normal Pap tests and no abnormal Pap or HPV tests in the past 10 years.   HPV screening.** / Every 3 years from ages 30 through ages 65 or 70 with a history of 3 consecutive normal Pap tests. Testing can be stopped between 65 and 70 with 3 consecutive normal Pap tests and no abnormal Pap or HPV tests in the past 10 years.   Fecal occult blood test (FOBT) of stool. / Every year beginning at age 50 and continuing until age 75. You may not need to do this test if you get a colonoscopy every 10 years.   Flexible sigmoidoscopy or colonoscopy.** / Every 5 years for a flexible sigmoidoscopy or every 10 years for a colonoscopy beginning at age 50 and continuing until age 75.   Hepatitis   C blood test.** / For all people born from 1945 through 1965 and any individual with known risks for hepatitis C.   Osteoporosis screening.** / A one-time screening for women ages 65 and over and women at risk for fractures or osteoporosis.   Skin self-exam. / Monthly.   Influenza immunization.** / Every year.   Pneumococcal polysaccharide immunization.** / 1 dose at age 65 (or older) if you have never been vaccinated.   Tetanus, diphtheria, pertussis (Tdap, Td) immunization. / A one-time dose of Tdap vaccine if you are over 65 and have contact with an infant, are a healthcare worker, or simply want to be protected from whooping cough. After that, you need a Td booster dose every 10 years.   Varicella immunization.** / Consult your caregiver.   Meningococcal immunization.** / Consult your caregiver.   Hepatitis A immunization.** / Consult your caregiver. 2 doses, 6 to 18 months apart.   Hepatitis B immunization.** / Check with your caregiver. 3 doses, usually over 6 months.  ** Family history and personal history of risk and conditions may change your caregiver's  recommendations. Document Released: 01/20/2002 Document Revised: 11/13/2011 Document Reviewed: 04/21/2011 [employer] Patient Information 2012 [employer]. 

## 2012-06-22 NOTE — Assessment & Plan Note (Signed)
Stable con't meds 

## 2012-06-26 LAB — URINE CULTURE

## 2012-07-17 ENCOUNTER — Other Ambulatory Visit: Payer: Self-pay | Admitting: Family Medicine

## 2013-01-26 ENCOUNTER — Other Ambulatory Visit (INDEPENDENT_AMBULATORY_CARE_PROVIDER_SITE_OTHER): Payer: Medicare PPO

## 2013-01-26 DIAGNOSIS — E785 Hyperlipidemia, unspecified: Secondary | ICD-10-CM

## 2013-01-26 LAB — BASIC METABOLIC PANEL
BUN: 21 mg/dL (ref 6–23)
Calcium: 9.1 mg/dL (ref 8.4–10.5)
GFR: 70.94 mL/min (ref >60.00)
Potassium: 3.8 mEq/L (ref 3.5–5.1)
Sodium: 140 mEq/L (ref 135–145)

## 2013-01-26 LAB — HEPATIC FUNCTION PANEL
ALT: 33 U/L (ref 0–35)
Bilirubin, Direct: 0 mg/dL (ref 0.0–0.3)
Total Protein: 7.6 g/dL (ref 6.0–8.3)

## 2013-01-26 LAB — LIPID PANEL
Cholesterol: 126 mg/dL (ref 0–200)
HDL: 42.8 mg/dL (ref >39.00)
VLDL: 23.8 mg/dL (ref 0.0–40.0)

## 2013-02-04 ENCOUNTER — Encounter: Payer: Self-pay | Admitting: Family Medicine

## 2013-02-17 ENCOUNTER — Other Ambulatory Visit: Payer: Self-pay | Admitting: Family Medicine

## 2013-04-25 ENCOUNTER — Other Ambulatory Visit: Payer: Self-pay | Admitting: Family Medicine

## 2013-07-22 ENCOUNTER — Ambulatory Visit (INDEPENDENT_AMBULATORY_CARE_PROVIDER_SITE_OTHER): Payer: Medicare PPO | Admitting: Family Medicine

## 2013-07-22 VITALS — BP 122/76 | HR 76 | Temp 98.2°F | Wt 166.0 lb

## 2013-07-22 DIAGNOSIS — R079 Chest pain, unspecified: Secondary | ICD-10-CM

## 2013-07-22 DIAGNOSIS — S20219A Contusion of unspecified front wall of thorax, initial encounter: Secondary | ICD-10-CM

## 2013-07-22 DIAGNOSIS — S20212A Contusion of left front wall of thorax, initial encounter: Secondary | ICD-10-CM

## 2013-07-22 DIAGNOSIS — R0781 Pleurodynia: Secondary | ICD-10-CM

## 2013-07-22 NOTE — Patient Instructions (Signed)
Rib Contusion  A rib contusion (bruise) can occur by a blow to the chest or by a fall against a hard object. Usually these will be much better in a couple weeks. If X-rays were taken today and there are no broken bones (fractures), the diagnosis of bruising is made. However, broken ribs may not show up for several days, or may be discovered later on a routine X-ray when signs of healing show up. If this happens to you, it does not mean that something was missed on the X-ray, but simply that it did not show up on the first X-rays. Earlier diagnosis will not usually change the treatment.  HOME CARE INSTRUCTIONS    Avoid strenuous activity. Be careful during activities and avoid bumping the injured ribs. Activities that pull on the injured ribs and cause pain should be avoided, if possible.   For the first day or two, an ice pack used every 20 minutes while awake may be helpful. Put ice in a plastic bag and put a towel between the bag and the skin.   Eat a normal, well-balanced diet. Drink plenty of fluids to avoid constipation.   Take deep breaths several times a day to keep lungs free of infection. Try to cough several times a day. Splint the injured area with a pillow while coughing to ease pain. Coughing can help prevent pneumonia.   Wear a rib belt or binder only if told to do so by your caregiver. If you are wearing a rib belt or binder, you must do the breathing exercises as directed by your caregiver. If not used properly, rib belts or binders restrict breathing which can lead to pneumonia.   Only take over-the-counter or prescription medicines for pain, discomfort, or fever as directed by your caregiver.  SEEK MEDICAL CARE IF:    You or your child has an oral temperature above 102 F (38.9 C).   Your baby is older than 3 months with a rectal temperature of 100.5 F (38.1 C) or higher for more than 1 day.   You develop a cough, with thick or bloody sputum.  SEEK IMMEDIATE MEDICAL CARE IF:    You  have difficulty breathing.   You feel sick to your stomach (nausea), have vomiting or belly (abdominal) pain.   You have worsening pain, not controlled with medications, or there is a change in the location of the pain.   You develop sweating or radiation of the pain into the arms, jaw or shoulders, or become light headed or faint.   You or your child has an oral temperature above 102 F (38.9 C), not controlled by medicine.   Your or your baby is older than 3 months with a rectal temperature of 102 F (38.9 C) or higher.   Your baby is 3 months old or younger with a rectal temperature of 100.4 F (38 C) or higher.  MAKE SURE YOU:    Understand these instructions.   Will watch your condition.   Will get help right away if you are not doing well or get worse.  Document Released: 08/19/2001 Document Revised: 02/16/2012 Document Reviewed: 07/12/2008  [employer] Patient Information 2014 [employer].

## 2013-07-25 ENCOUNTER — Encounter: Payer: Self-pay | Admitting: Family Medicine

## 2013-07-25 DIAGNOSIS — R0781 Pleurodynia: Secondary | ICD-10-CM | POA: Insufficient documentation

## 2013-07-25 NOTE — Progress Notes (Signed)
  Subjective:    Patient ID: Cindy Velasquez, female    DOB: 1941-06-25, 72 y.o.   MRN: [id number]  HPI Pt here c/o rib pain after slipping while on her knees.  She hit her side against the wall.   Review of Systems As above     Objective:   Physical Exam BP 122/76  Pulse 76  Temp(Src) 98.2 F (36.8 C) (Oral)  Wt 166 lb (75.297 kg)  BMI 31.87 kg/m2  SpO2 93% General appearance: alert, cooperative, appears stated age and no distress Lungs: clear to auscultation bilaterally Heart: S1, S2 normal Ribs-- no bruising, no pain with palpation      Assessment & Plan:

## 2013-07-25 NOTE — Assessment & Plan Note (Signed)
Warm compresses Doubt fracture but will check xray if no better.

## 2013-08-01 ENCOUNTER — Encounter: Payer: Self-pay | Admitting: Family Medicine

## 2013-08-01 ENCOUNTER — Ambulatory Visit (INDEPENDENT_AMBULATORY_CARE_PROVIDER_SITE_OTHER): Payer: Medicare PPO | Admitting: Family Medicine

## 2013-08-01 VITALS — BP 120/68 | HR 77 | Temp 98.6°F | Ht 60.5 in | Wt 165.4 lb

## 2013-08-01 DIAGNOSIS — S0081XA Abrasion of other part of head, initial encounter: Secondary | ICD-10-CM

## 2013-08-01 DIAGNOSIS — M810 Age-related osteoporosis without current pathological fracture: Secondary | ICD-10-CM

## 2013-08-01 DIAGNOSIS — I10 Essential (primary) hypertension: Secondary | ICD-10-CM

## 2013-08-01 DIAGNOSIS — Z0000 Encounter for general adult medical examination without abnormal findings: Secondary | ICD-10-CM

## 2013-08-01 DIAGNOSIS — IMO0002 Reserved for concepts with insufficient information to code with codable children: Secondary | ICD-10-CM

## 2013-08-01 DIAGNOSIS — Z23 Encounter for immunization: Secondary | ICD-10-CM

## 2013-08-01 DIAGNOSIS — E785 Hyperlipidemia, unspecified: Secondary | ICD-10-CM

## 2013-08-01 LAB — CBC WITH DIFFERENTIAL/PLATELET
Basophils Absolute: 0.1 10*3/uL (ref 0.0–0.1)
Basophils Relative: 0.8 % (ref 0.0–3.0)
Eosinophils Absolute: 0.5 10*3/uL (ref 0.0–0.7)
HCT: 43.7 % (ref 36.0–46.0)
Hemoglobin: 14.4 g/dL (ref 12.0–15.0)
Lymphs Abs: 1.6 10*3/uL (ref 0.7–4.0)
MCHC: 33 g/dL (ref 30.0–36.0)
MCV: 89.6 fl (ref 78.0–100.0)
Neutro Abs: 4 10*3/uL (ref 1.4–7.7)
RBC: 4.87 Mil/uL (ref 3.87–5.11)
RDW: 14.4 % (ref 11.5–14.6)

## 2013-08-01 LAB — HEPATIC FUNCTION PANEL
AST: 38 U/L — ABNORMAL HIGH (ref 0–37)
Albumin: 3.8 g/dL (ref 3.5–5.2)
Alkaline Phosphatase: 70 U/L (ref 39–117)
Total Protein: 7.6 g/dL (ref 6.0–8.3)

## 2013-08-01 LAB — POCT URINALYSIS DIPSTICK
Bilirubin, UA: NEGATIVE
Ketones, UA: NEGATIVE
Leukocytes, UA: NEGATIVE
Protein, UA: NEGATIVE
Spec Grav, UA: <=1.005

## 2013-08-01 LAB — LIPID PANEL
Cholesterol: 130 mg/dL (ref 0–200)
HDL: 46 mg/dL (ref >39.00)
VLDL: 25 mg/dL (ref 0.0–40.0)

## 2013-08-01 LAB — BASIC METABOLIC PANEL
CO2: 32 mEq/L (ref 19–32)
Calcium: 9.2 mg/dL (ref 8.4–10.5)
Sodium: 137 mEq/L (ref 135–145)

## 2013-08-01 NOTE — Progress Notes (Signed)
Subjective:    Cindy Velasquez is a 72 y.o. female who presents for Medicare Annual/Subsequent preventive examination.  Preventive Screening-Counseling & Management  Tobacco History  Smoking status  . Former Smoker -- 0.50 packs/day for 30 years  . Types: Cigarettes  . Quit date: 06/02/1991  Smokeless tobacco  . Never Used     Problems Prior to Visit 1. na  Current Problems (verified) Patient Active Problem List   Diagnosis Date Noted  . Abrasion of face 08/01/2013  . Obesity (BMI 30-39.9) 07/25/2013  . Rib pain on left side 07/25/2013  . Tinea cruris 06/22/2012  . Rosacea 06/22/2012  . Eustachian tube dysfunction 04/07/2012  . Cerumen impaction 04/07/2012  . SINUSITIS - ACUTE-NOS 10/30/2010  . URI 10/30/2010  . CHANGE IN BOWELS 04/03/2010  . FECAL OCCULT BLOOD 04/03/2010  . PERSONAL HX COLONIC POLYPS 04/03/2010  . DYSPEPSIA 03/04/2010  . GUAIAC POSITIVE STOOL 03/04/2010  . LOOSE STOOLS 03/04/2010  . HEMATURIA UNSPECIFIED 06/28/2009  . DYSURIA 06/28/2009  . HAMMER TOE 12/28/2008  . POSTMENOPAUSAL STATUS 12/28/2008  . BACK PAIN, LUMBAR 07/11/2008  . BENIGN NEOPLASM OF SKIN SITE UNSPECIFIED 02/28/2008  . HYPERLIPIDEMIA 04/15/2007  . HYPERTENSION 04/15/2007  . GERD 04/15/2007  . DIVERTICULOSIS, COLON 04/15/2007  . THUMB PAIN, RIGHT 04/15/2007  . OSTEOPOROSIS 04/15/2007    Medications Prior to Visit Current Outpatient Prescriptions on File Prior to Visit  Medication Sig Dispense Refill  . aspirin 81 MG tablet Take 81 mg by mouth daily.        . Calcium Carbonate-Vitamin D (CALTRATE 600+D) 600-400 MG-UNIT per tablet Take 1 tablet by mouth daily.        . CRESTOR 20 MG tablet take 1 tablet by mouth once daily  30 tablet  5  . diphenhydramine-acetaminophen (TYLENOL PM) 25-500 MG TABS Take 1 tablet by mouth at bedtime as needed.        . hydrochlorothiazide (HYDRODIURIL) 25 MG tablet take 1/2 tablet by mouth once daily  45 tablet  5  . METRONIDAZOLE, TOPICAL, 0.75 %  LOTN Use bid  1 Bottle  2  . NONFORMULARY OR COMPOUNDED ITEM Wheat grass 1 scoop mixed in water daily       No current facility-administered medications on file prior to visit.    Current Medications (verified) Current Outpatient Prescriptions  Medication Sig Dispense Refill  . aspirin 81 MG tablet Take 81 mg by mouth daily.        . Calcium Carbonate-Vitamin D (CALTRATE 600+D) 600-400 MG-UNIT per tablet Take 1 tablet by mouth daily.        . CRESTOR 20 MG tablet take 1 tablet by mouth once daily  30 tablet  5  . diphenhydramine-acetaminophen (TYLENOL PM) 25-500 MG TABS Take 1 tablet by mouth at bedtime as needed.        . hydrochlorothiazide (HYDRODIURIL) 25 MG tablet take 1/2 tablet by mouth once daily  45 tablet  5  . METRONIDAZOLE, TOPICAL, 0.75 % LOTN Use bid  1 Bottle  2  . NONFORMULARY OR COMPOUNDED ITEM Wheat grass 1 scoop mixed in water daily       No current facility-administered medications for this visit.     Allergies (verified) Review of patient's allergies indicates no known allergies.   PAST HISTORY  Family History Family History  Problem Relation Age of Onset  . Hypertension Mother   . Cancer Mother 72    ?  Marland Kitchen Hypertension Father   . Stroke Father   .  Hypertension Brother   . Cancer Brother 35    testicular   . Hypertension Brother   . Heart disease Brother     quadruple bypass    Social History History  Substance Use Topics  . Smoking status: Former Smoker -- 0.50 packs/day for 30 years    Types: Cigarettes    Quit date: 06/02/1991  . Smokeless tobacco: Never Used  . Alcohol Use: 3.0 oz/week    5 Glasses of wine per week     Are there smokers in your home (other than you)? No  Risk Factors Current exercise habits: Gym/ health club routine includes cardio and yoga.  Dietary issues discussed: na  Cardiac risk factors: advanced age (older than 89 for men, 60 for women), dyslipidemia, hypertension and obesity (BMI >= 30 kg/m2).  Depression  Screen (Note: if answer to either of the following is "Yes", a more complete depression screening is indicated)   Over the past two weeks, have you felt down, depressed or hopeless? No  Over the past two weeks, have you felt little interest or pleasure in doing things? No  Have you lost interest or pleasure in daily life? No  Do you often feel hopeless? No  Do you cry easily over simple problems? No  Activities of Daily Living In your present state of health, do you have any difficulty performing the following activities?:  Driving? No Managing money?  No Feeding yourself? No Getting from bed to chair? No Climbing a flight of stairs? No Preparing food and eating?: No Bathing or showering? No Getting dressed: No Getting to the toilet? No Using the toilet:No Moving around from place to place: No In the past year have you fallen or had a near fall?:No   Are you sexually active?  Yes  Do you have more than one partner?  No  Hearing Difficulties: No Do you often ask people to speak up or repeat themselves? No Do you experience ringing or noises in your ears? No Do you have difficulty understanding soft or whispered voices? No   Do you feel that you have a problem with memory? No  Do you often misplace items? No  Do you feel safe at home?  No  Cognitive Testing  Alert? Yes  Normal Appearance?Yes  Oriented to person? Yes  Place? Yes   Time? Yes  Recall of three objects?  Yes  Can perform simple calculations? Yes  Displays appropriate judgment?Yes  Can read the correct time from a watch face?Yes   Advanced Directives have been discussed with the patient? Yes  List the Names of Other Physician/Practitioners you currently use: 1.  oph-- [facility] 2  Dentist-- [facility] 3  Indicate any recent Medical Services you may have received from other than [facility] providers in the past year (date may be approximate).  Immunization History  Administered Date(s) Administered  . H1N1 12/28/2008   . Influenza Whole 10/20/2007, 10/18/2008, 11/15/2012  . Pneumococcal Polysaccharide 12/28/2008    Screening Tests Health Maintenance  Topic Date Due  . Tetanus/tdap  07/25/1960  . Influenza Vaccine  08/08/2013  . Mammogram  01/11/2014  . Colonoscopy  05/07/2020  . Pneumococcal Polysaccharide Vaccine Age 33 And Over  Completed  . Zostavax  Completed    All answers were reviewed with the patient and necessary referrals were made:  Loreen Freud, DO   08/01/2013   History reviewed:  She  has a past medical history of Hypertension; Hyperlipidemia; Osteoporosis; GERD (gastroesophageal reflux disease); Adenomatous colon polyp;  Hyperplastic colon polyp; and Diverticulosis of colon. She  does not have any pertinent problems on file. She  has past surgical history that includes Tubal ligation; Hammer toe surgery; Rotator cuff repair; and Eye surgery (2012). Her family history includes Cancer (age of onset: 19) in her brother; Cancer (age of onset: 1) in her mother; Heart disease in her brother; Hypertension in her brother, brother, father, and mother; Stroke in her father. She  reports that she quit smoking about 22 years ago. Her smoking use included Cigarettes. She has a 15 pack-year smoking history. She has never used smokeless tobacco. She reports that she drinks about 3.0 ounces of alcohol per week. She reports that she does not use illicit drugs. She has a current medication list which includes the following prescription(s): aspirin, calcium carbonate-vitamin d, crestor, diphenhydramine-acetaminophen, hydrochlorothiazide, metronidazole (topical), and NONFORMULARY OR COMPOUNDED ITEM. Current Outpatient Prescriptions on File Prior to Visit  Medication Sig Dispense Refill  . aspirin 81 MG tablet Take 81 mg by mouth daily.        . Calcium Carbonate-Vitamin D (CALTRATE 600+D) 600-400 MG-UNIT per tablet Take 1 tablet by mouth daily.        . CRESTOR 20 MG tablet take 1 tablet by mouth once  daily  30 tablet  5  . diphenhydramine-acetaminophen (TYLENOL PM) 25-500 MG TABS Take 1 tablet by mouth at bedtime as needed.        . hydrochlorothiazide (HYDRODIURIL) 25 MG tablet take 1/2 tablet by mouth once daily  45 tablet  5  . METRONIDAZOLE, TOPICAL, 0.75 % LOTN Use bid  1 Bottle  2  . NONFORMULARY OR COMPOUNDED ITEM Wheat grass 1 scoop mixed in water daily       No current facility-administered medications on file prior to visit.   She has No Known Allergies.  Review of Systems  Review of Systems  Constitutional: Negative for activity change, appetite change and fatigue.  HENT: Negative for hearing loss, congestion, tinnitus and ear discharge.   Eyes: Negative for visual disturbance (see optho q1y -- vision corrected to 20/20 with glasses).  Respiratory: Negative for cough, chest tightness and shortness of breath.   Cardiovascular: Negative for chest pain, palpitations and leg swelling.  Gastrointestinal: Negative for abdominal pain, diarrhea, constipation and abdominal distention.  Genitourinary: Negative for urgency, frequency, decreased urine volume and difficulty urinating.  Musculoskeletal: Negative for back pain, arthralgias and gait problem.  Skin: Negative for color change, pallor and rash.  Neurological: Negative for dizziness, light-headedness, numbness and headaches.  Hematological: Negative for adenopathy. Does not bruise/bleed easily.  Psychiatric/Behavioral: Negative for suicidal ideas, confusion, sleep disturbance, self-injury, dysphoric mood, decreased concentration and agitation.  Pt is able to read and write and can do all ADLs No risk for falling No abuse/ violence in home      Objective:     Vision by Snellen chart: opth  Body mass index is 31.76 kg/(m^2). BP 120/68  Pulse 77  Temp(Src) 98.6 F (37 C) (Oral)  Ht 5' 0.5" (1.537 m)  Wt 165 lb 6.4 oz (75.025 kg)  BMI 31.76 kg/m2  SpO2 97%  BP 120/68  Pulse 77  Temp(Src) 98.6 F (37 C) (Oral)   Ht 5' 0.5" (1.537 m)  Wt 165 lb 6.4 oz (75.025 kg)  BMI 31.76 kg/m2  SpO2 97% General appearance: alert, cooperative, appears stated age and no distress Head: Normocephalic, without obvious abnormality, atraumatic Eyes: conjunctivae/corneas clear. PERRL, EOM's intact. Fundi benign. Ears: normal TM's and external ear  canals both ears Nose: Nares normal. Septum midline. Mucosa normal. No drainage or sinus tenderness. Throat: lips, mucosa, and tongue normal; teeth and gums normal Neck: no adenopathy, no carotid bruit, no JVD, supple, symmetrical, trachea midline and thyroid not enlarged, symmetric, no tenderness/mass/nodules Back: symmetric, no curvature. ROM normal. No CVA tenderness. Lungs: clear to auscultation bilaterally Breasts: normal appearance, no masses or tenderness Heart: S1, S2 normal Abdomen: soft, non-tender; bowel sounds normal; no masses,  no organomegaly Pelvic: deferred --gyn Extremities: extremities normal, atraumatic, no cyanosis or edema Pulses: 2+ and symmetric Skin: Skin color, texture, turgor normal. No rashes or lesions--- small abrasion R side nose Lymph nodes: Cervical, supraclavicular, and axillary nodes normal. Neurologic: Alert and oriented X 3, normal strength and tone. Normal symmetric reflexes. Normal coordination and gait Psych-- no depression, no anxiety      Assessment:     cpe      Plan:     During the course of the visit the patient was educated and counseled about appropriate screening and preventive services including:    Pneumococcal vaccine   Influenza vaccine  Td vaccine  Screening mammography  Screening Pap smear and pelvic exam   Bone densitometry screening  Colorectal cancer screening  Glaucoma screening  Advanced directives: has an advanced directive - a copy HAS NOT been provided.  Diet review for nutrition referral? Yes ____  Not Indicated _x___   Patient Instructions (the written plan) was given to the  patient.  Medicare Attestation I have personally reviewed: The patient's medical and social history Their use of alcohol, tobacco or illicit drugs Their current medications and supplements The patient's functional ability including ADLs,fall risks, home safety risks, cognitive, and hearing and visual impairment Diet and physical activities Evidence for depression or mood disorders  The patient's weight, height, BMI, and visual acuity have been recorded in the chart.  I have made referrals, counseling, and provided education to the patient based on review of the above and I have provided the patient with a written personalized care plan for preventive services.     Loreen Freud, DO   08/01/2013

## 2013-08-01 NOTE — Assessment & Plan Note (Signed)
Stable con't meds 

## 2013-08-01 NOTE — Assessment & Plan Note (Signed)
   Check labs  con't crestor 

## 2013-08-01 NOTE — Assessment & Plan Note (Signed)
Check bmd 

## 2013-08-01 NOTE — Assessment & Plan Note (Signed)
tdap given today

## 2013-08-01 NOTE — Patient Instructions (Signed)
Preventive Care for Adults, Female A healthy lifestyle and preventive care can promote health and wellness. Preventive health guidelines for women include the following key practices.  A routine yearly physical is a good way to check with your caregiver about your health and preventive screening. It is a chance to share any concerns and updates on your health, and to receive a thorough exam.  Visit your dentist for a routine exam and preventive care every 6 months. Brush your teeth twice a day and floss once a day. Good oral hygiene prevents tooth decay and gum disease.  The frequency of eye exams is based on your age, health, family medical history, use of contact lenses, and other factors. Follow your caregiver's recommendations for frequency of eye exams.  Eat a healthy diet. Foods like vegetables, fruits, whole grains, low-fat dairy products, and lean protein foods contain the nutrients you need without too many calories. Decrease your intake of foods high in solid fats, added sugars, and salt. Eat the right amount of calories for you.Get information about a proper diet from your caregiver, if necessary.  Regular physical exercise is one of the most important things you can do for your health. Most adults should get at least 150 minutes of moderate-intensity exercise (any activity that increases your heart rate and causes you to sweat) each week. In addition, most adults need muscle-strengthening exercises on 2 or more days a week.  Maintain a healthy weight. The body mass index (BMI) is a screening tool to identify possible weight problems. It provides an estimate of body fat based on height and weight. Your caregiver can help determine your BMI, and can help you achieve or maintain a healthy weight.For adults 20 years and older:  A BMI below 18.5 is considered underweight.  A BMI of 18.5 to 24.9 is normal.  A BMI of 25 to 29.9 is considered overweight.  A BMI of 30 and above is  considered obese.  Maintain normal blood lipids and cholesterol levels by exercising and minimizing your intake of saturated fat. Eat a balanced diet with plenty of fruit and vegetables. Blood tests for lipids and cholesterol should begin at age 20 and be repeated every 5 years. If your lipid or cholesterol levels are high, you are over 50, or you are at high risk for heart disease, you may need your cholesterol levels checked more frequently.Ongoing high lipid and cholesterol levels should be treated with medicines if diet and exercise are not effective.  If you smoke, find out from your caregiver how to quit. If you do not use tobacco, do not start.  If you are pregnant, do not drink alcohol. If you are breastfeeding, be very cautious about drinking alcohol. If you are not pregnant and choose to drink alcohol, do not exceed 1 drink per day. One drink is considered to be 12 ounces (355 mL) of beer, 5 ounces (148 mL) of wine, or 1.5 ounces (44 mL) of liquor.  Avoid use of street drugs. Do not share needles with anyone. Ask for help if you need support or instructions about stopping the use of drugs.  High blood pressure causes heart disease and increases the risk of stroke. Your blood pressure should be checked at least every 1 to 2 years. Ongoing high blood pressure should be treated with medicines if weight loss and exercise are not effective.  If you are 55 to 72 years old, ask your caregiver if you should take aspirin to prevent strokes.  Diabetes   screening involves taking a blood sample to check your fasting blood sugar level. This should be done once every 3 years, after age 45, if you are within normal weight and without risk factors for diabetes. Testing should be considered at a younger age or be carried out more frequently if you are overweight and have at least 1 risk factor for diabetes.  Breast cancer screening is essential preventive care for women. You should practice "breast  self-awareness." This means understanding the normal appearance and feel of your breasts and may include breast self-examination. Any changes detected, no matter how small, should be reported to a caregiver. Women in their 20s and 30s should have a clinical breast exam (CBE) by a caregiver as part of a regular health exam every 1 to 3 years. After age 40, women should have a CBE every year. Starting at age 40, women should consider having a mammography (breast X-ray test) every year. Women who have a family history of breast cancer should talk to their caregiver about genetic screening. Women at a high risk of breast cancer should talk to their caregivers about having magnetic resonance imaging (MRI) and a mammography every year.  The Pap test is a screening test for cervical cancer. A Pap test can show cell changes on the cervix that might become cervical cancer if left untreated. A Pap test is a procedure in which cells are obtained and examined from the lower end of the uterus (cervix).  Women should have a Pap test starting at age 21.  Between ages 21 and 29, Pap tests should be repeated every 2 years.  Beginning at age 30, you should have a Pap test every 3 years as long as the past 3 Pap tests have been normal.  Some women have medical problems that increase the chance of getting cervical cancer. Talk to your caregiver about these problems. It is especially important to talk to your caregiver if a new problem develops soon after your last Pap test. In these cases, your caregiver may recommend more frequent screening and Pap tests.  The above recommendations are the same for women who have or have not gotten the vaccine for human papillomavirus (HPV).  If you had a hysterectomy for a problem that was not cancer or a condition that could lead to cancer, then you no longer need Pap tests. Even if you no longer need a Pap test, a regular exam is a good idea to make sure no other problems are  starting.  If you are between ages 65 and 70, and you have had normal Pap tests going back 10 years, you no longer need Pap tests. Even if you no longer need a Pap test, a regular exam is a good idea to make sure no other problems are starting.  If you have had past treatment for cervical cancer or a condition that could lead to cancer, you need Pap tests and screening for cancer for at least 20 years after your treatment.  If Pap tests have been discontinued, risk factors (such as a new sexual partner) need to be reassessed to determine if screening should be resumed.  The HPV test is an additional test that may be used for cervical cancer screening. The HPV test looks for the virus that can cause the cell changes on the cervix. The cells collected during the Pap test can be tested for HPV. The HPV test could be used to screen women aged 30 years and older, and should   be used in women of any age who have unclear Pap test results. After the age of 30, women should have HPV testing at the same frequency as a Pap test.  Colorectal cancer can be detected and often prevented. Most routine colorectal cancer screening begins at the age of 50 and continues through age 75. However, your caregiver may recommend screening at an earlier age if you have risk factors for colon cancer. On a yearly basis, your caregiver may provide home test kits to check for hidden blood in the stool. Use of a small camera at the end of a tube, to directly examine the colon (sigmoidoscopy or colonoscopy), can detect the earliest forms of colorectal cancer. Talk to your caregiver about this at age 50, when routine screening begins. Direct examination of the colon should be repeated every 5 to 10 years through age 75, unless early forms of pre-cancerous polyps or small growths are found.  Hepatitis C blood testing is recommended for all people born from 1945 through 1965 and any individual with known risks for hepatitis C.  Practice  safe sex. Use condoms and avoid high-risk sexual practices to reduce the spread of sexually transmitted infections (STIs). STIs include gonorrhea, chlamydia, syphilis, trichomonas, herpes, HPV, and human immunodeficiency virus (HIV). Herpes, HIV, and HPV are viral illnesses that have no cure. They can result in disability, cancer, and death. Sexually active women aged 25 and younger should be checked for chlamydia. Older women with new or multiple partners should also be tested for chlamydia. Testing for other STIs is recommended if you are sexually active and at increased risk.  Osteoporosis is a disease in which the bones lose minerals and strength with aging. This can result in serious bone fractures. The risk of osteoporosis can be identified using a bone density scan. Women ages 65 and over and women at risk for fractures or osteoporosis should discuss screening with their caregivers. Ask your caregiver whether you should take a calcium supplement or vitamin D to reduce the rate of osteoporosis.  Menopause can be associated with physical symptoms and risks. Hormone replacement therapy is available to decrease symptoms and risks. You should talk to your caregiver about whether hormone replacement therapy is right for you.  Use sunscreen with sun protection factor (SPF) of 30 or more. Apply sunscreen liberally and repeatedly throughout the day. You should seek shade when your shadow is shorter than you. Protect yourself by wearing long sleeves, pants, a wide-brimmed hat, and sunglasses year round, whenever you are outdoors.  Once a month, do a whole body skin exam, using a mirror to look at the skin on your back. Notify your caregiver of new moles, moles that have irregular borders, moles that are larger than a pencil eraser, or moles that have changed in shape or color.  Stay current with required immunizations.  Influenza. You need a dose every fall (or winter). The composition of the flu vaccine  changes each year, so being vaccinated once is not enough.  Pneumococcal polysaccharide. You need 1 to 2 doses if you smoke cigarettes or if you have certain chronic medical conditions. You need 1 dose at age 65 (or older) if you have never been vaccinated.  Tetanus, diphtheria, pertussis (Tdap, Td). Get 1 dose of Tdap vaccine if you are younger than age 65, are over 65 and have contact with an infant, are a healthcare worker, are pregnant, or simply want to be protected from whooping cough. After that, you need a Td   booster dose every 10 years. Consult your caregiver if you have not had at least 3 tetanus and diphtheria-containing shots sometime in your life or have a deep or dirty wound.  HPV. You need this vaccine if you are a woman age 26 or younger. The vaccine is given in 3 doses over 6 months.  Measles, mumps, rubella (MMR). You need at least 1 dose of MMR if you were born in 1957 or later. You may also need a second dose.  Meningococcal. If you are age 19 to 21 and a first-year college student living in a residence hall, or have one of several medical conditions, you need to get vaccinated against meningococcal disease. You may also need additional booster doses.  Zoster (shingles). If you are age 60 or older, you should get this vaccine.  Varicella (chickenpox). If you have never had chickenpox or you were vaccinated but received only 1 dose, talk to your caregiver to find out if you need this vaccine.  Hepatitis A. You need this vaccine if you have a specific risk factor for hepatitis A virus infection or you simply wish to be protected from this disease. The vaccine is usually given as 2 doses, 6 to 18 months apart.  Hepatitis B. You need this vaccine if you have a specific risk factor for hepatitis B virus infection or you simply wish to be protected from this disease. The vaccine is given in 3 doses, usually over 6 months. Preventive Services / Frequency Ages 19 to 39  Blood  pressure check.** / Every 1 to 2 years.  Lipid and cholesterol check.** / Every 5 years beginning at age 20.  Clinical breast exam.** / Every 3 years for women in their 20s and 30s.  Pap test.** / Every 2 years from ages 21 through 29. Every 3 years starting at age 30 through age 65 or 70 with a history of 3 consecutive normal Pap tests.  HPV screening.** / Every 3 years from ages 30 through ages 65 to 70 with a history of 3 consecutive normal Pap tests.  Hepatitis C blood test.** / For any individual with known risks for hepatitis C.  Skin self-exam. / Monthly.  Influenza immunization.** / Every year.  Pneumococcal polysaccharide immunization.** / 1 to 2 doses if you smoke cigarettes or if you have certain chronic medical conditions.  Tetanus, diphtheria, pertussis (Tdap, Td) immunization. / A one-time dose of Tdap vaccine. After that, you need a Td booster dose every 10 years.  HPV immunization. / 3 doses over 6 months, if you are 26 and younger.  Measles, mumps, rubella (MMR) immunization. / You need at least 1 dose of MMR if you were born in 1957 or later. You may also need a second dose.  Meningococcal immunization. / 1 dose if you are age 19 to 21 and a first-year college student living in a residence hall, or have one of several medical conditions, you need to get vaccinated against meningococcal disease. You may also need additional booster doses.  Varicella immunization.** / Consult your caregiver.  Hepatitis A immunization.** / Consult your caregiver. 2 doses, 6 to 18 months apart.  Hepatitis B immunization.** / Consult your caregiver. 3 doses usually over 6 months. Ages 40 to 64  Blood pressure check.** / Every 1 to 2 years.  Lipid and cholesterol check.** / Every 5 years beginning at age 20.  Clinical breast exam.** / Every year after age 40.  Mammogram.** / Every year beginning at age 40   and continuing for as long as you are in good health. Consult with your  caregiver.  Pap test.** / Every 3 years starting at age 30 through age 65 or 70 with a history of 3 consecutive normal Pap tests.  HPV screening.** / Every 3 years from ages 30 through ages 65 to 70 with a history of 3 consecutive normal Pap tests.  Fecal occult blood test (FOBT) of stool. / Every year beginning at age 50 and continuing until age 75. You may not need to do this test if you get a colonoscopy every 10 years.  Flexible sigmoidoscopy or colonoscopy.** / Every 5 years for a flexible sigmoidoscopy or every 10 years for a colonoscopy beginning at age 50 and continuing until age 75.  Hepatitis C blood test.** / For all people born from 1945 through 1965 and any individual with known risks for hepatitis C.  Skin self-exam. / Monthly.  Influenza immunization.** / Every year.  Pneumococcal polysaccharide immunization.** / 1 to 2 doses if you smoke cigarettes or if you have certain chronic medical conditions.  Tetanus, diphtheria, pertussis (Tdap, Td) immunization.** / A one-time dose of Tdap vaccine. After that, you need a Td booster dose every 10 years.  Measles, mumps, rubella (MMR) immunization. / You need at least 1 dose of MMR if you were born in 1957 or later. You may also need a second dose.  Varicella immunization.** / Consult your caregiver.  Meningococcal immunization.** / Consult your caregiver.  Hepatitis A immunization.** / Consult your caregiver. 2 doses, 6 to 18 months apart.  Hepatitis B immunization.** / Consult your caregiver. 3 doses, usually over 6 months. Ages 65 and over  Blood pressure check.** / Every 1 to 2 years.  Lipid and cholesterol check.** / Every 5 years beginning at age 20.  Clinical breast exam.** / Every year after age 40.  Mammogram.** / Every year beginning at age 40 and continuing for as long as you are in good health. Consult with your caregiver.  Pap test.** / Every 3 years starting at age 30 through age 65 or 70 with a 3  consecutive normal Pap tests. Testing can be stopped between 65 and 70 with 3 consecutive normal Pap tests and no abnormal Pap or HPV tests in the past 10 years.  HPV screening.** / Every 3 years from ages 30 through ages 65 or 70 with a history of 3 consecutive normal Pap tests. Testing can be stopped between 65 and 70 with 3 consecutive normal Pap tests and no abnormal Pap or HPV tests in the past 10 years.  Fecal occult blood test (FOBT) of stool. / Every year beginning at age 50 and continuing until age 75. You may not need to do this test if you get a colonoscopy every 10 years.  Flexible sigmoidoscopy or colonoscopy.** / Every 5 years for a flexible sigmoidoscopy or every 10 years for a colonoscopy beginning at age 50 and continuing until age 75.  Hepatitis C blood test.** / For all people born from 1945 through 1965 and any individual with known risks for hepatitis C.  Osteoporosis screening.** / A one-time screening for women ages 65 and over and women at risk for fractures or osteoporosis.  Skin self-exam. / Monthly.  Influenza immunization.** / Every year.  Pneumococcal polysaccharide immunization.** / 1 dose at age 65 (or older) if you have never been vaccinated.  Tetanus, diphtheria, pertussis (Tdap, Td) immunization. / A one-time dose of Tdap vaccine if you are over   65 and have contact with an infant, are a healthcare worker, or simply want to be protected from whooping cough. After that, you need a Td booster dose every 10 years.  Varicella immunization.** / Consult your caregiver.  Meningococcal immunization.** / Consult your caregiver.  Hepatitis A immunization.** / Consult your caregiver. 2 doses, 6 to 18 months apart.  Hepatitis B immunization.** / Check with your caregiver. 3 doses, usually over 6 months. ** Family history and personal history of risk and conditions may change your caregiver's recommendations. Document Released: 01/20/2002 Document Revised: 02/16/2012  Document Reviewed: 04/21/2011 [employer] Patient Information 2014 [employer].  

## 2013-08-24 ENCOUNTER — Other Ambulatory Visit (INDEPENDENT_AMBULATORY_CARE_PROVIDER_SITE_OTHER): Payer: Medicare PPO

## 2013-08-24 DIAGNOSIS — R7401 Elevation of levels of liver transaminase levels: Secondary | ICD-10-CM

## 2013-08-24 LAB — GAMMA GT: GGT: 36 U/L (ref 7–51)

## 2013-08-24 LAB — HEPATIC FUNCTION PANEL
ALT: 48 U/L — ABNORMAL HIGH (ref 0–35)
AST: 41 U/L — ABNORMAL HIGH (ref 0–37)
Alkaline Phosphatase: 64 U/L (ref 39–117)
Total Bilirubin: 0.4 mg/dL (ref 0.3–1.2)

## 2013-08-26 LAB — HEPATITIS PANEL, ACUTE
HCV Ab: NEGATIVE
Hep A IgM: NEGATIVE

## 2013-10-03 DIAGNOSIS — M2040 Other hammer toe(s) (acquired), unspecified foot: Secondary | ICD-10-CM

## 2013-10-11 ENCOUNTER — Ambulatory Visit (INDEPENDENT_AMBULATORY_CARE_PROVIDER_SITE_OTHER): Payer: Medicare HMO

## 2013-10-11 VITALS — BP 163/74 | HR 74 | Resp 12 | Ht 60.0 in | Wt 162.0 lb

## 2013-10-11 DIAGNOSIS — Z9889 Other specified postprocedural states: Secondary | ICD-10-CM

## 2013-10-11 DIAGNOSIS — M2042 Other hammer toe(s) (acquired), left foot: Secondary | ICD-10-CM

## 2013-10-11 DIAGNOSIS — M2040 Other hammer toe(s) (acquired), unspecified foot: Secondary | ICD-10-CM

## 2013-10-11 NOTE — Progress Notes (Signed)
  Subjective:    Patient ID: Cindy Velasquez, female    DOB: Dec 21, 1940, 72 y.o.   MRN: [id number]  HPI Comments: THE LT FOOT DOING MUCH BETTER'' DOS 10/03/13''  patient is a day status post hammertoe repair fifth left with derotational arthroplasty and exostectomy distal phalanx. No complaint of pain incision is clean dry well coapted sutures intact and dry    Review of Systems  Constitutional: Negative.   HENT: Negative.   Eyes: Negative.   Respiratory: Negative.   Cardiovascular: Negative.   Gastrointestinal: Negative.   Endocrine: Negative.   Genitourinary: Negative.   Musculoskeletal: Negative.   Skin: Negative.   Allergic/Immunologic: Negative.   Neurological: Negative.   Hematological: Negative.   Psychiatric/Behavioral: Negative.        Objective:   Physical Exam Neurovascular status intact Refill time 2-3 seconds all digits dressings intact and dry sutures intact no pain. X-rays revealed adequate resection of bone fifth digit left foot       Assessment & Plan:  Good postop progress status post hammertoe repair with derotational arthroplasty fifth left and exostectomy distal phalanx fifth left. Reappointed one week for suture removal at that time. Maintain Darco shoe as instructed recommend ice for any edema or swelling.  Alvan Dame DPM

## 2013-10-11 NOTE — Patient Instructions (Signed)

## 2013-10-13 ENCOUNTER — Other Ambulatory Visit: Payer: Self-pay

## 2013-10-18 ENCOUNTER — Ambulatory Visit (INDEPENDENT_AMBULATORY_CARE_PROVIDER_SITE_OTHER): Payer: Medicare HMO

## 2013-10-18 VITALS — BP 162/87 | HR 81 | Resp 20

## 2013-10-18 DIAGNOSIS — M2042 Other hammer toe(s) (acquired), left foot: Secondary | ICD-10-CM

## 2013-10-18 DIAGNOSIS — Z9889 Other specified postprocedural states: Secondary | ICD-10-CM

## 2013-10-18 DIAGNOSIS — M2040 Other hammer toe(s) (acquired), unspecified foot: Secondary | ICD-10-CM

## 2013-10-18 NOTE — Progress Notes (Signed)
  Subjective:    Patient ID: Cindy Velasquez, female    DOB: August 30, 1941, 72 y.o.   MRN: [id number] "It's good, very good."  HPI no changes medication her health is   Review of Systems deferred at this    Objective:   Physical Exam Neurovascular status is intact to all toes left foot. Sutures intact and dry dressings intact and dry wound is well coapted at this time sutures are removed Neosporin and Coflex wrapping applied to the fifth digit with buddy the fourth toe.       Assessment & Plan:  Good postop progress status post hammertoe repair fifth toe left foot. Maintain Coflex wrapping as instructed. Use ice pack to keep down and swelling. Maintain Darco shoe for one to 2 more weeks then resume comfortable athletic or casual walking shoes avoid anything tight or constrictive. Recheck in one month for long-term followup and x-rays contact us visiting changes or exacerbations or knee difficulties to  Alvan Dame DPM

## 2013-10-18 NOTE — Patient Instructions (Signed)

## 2013-11-15 ENCOUNTER — Ambulatory Visit (INDEPENDENT_AMBULATORY_CARE_PROVIDER_SITE_OTHER): Payer: Medicare HMO

## 2013-11-15 VITALS — BP 171/82 | HR 66 | Resp 20 | Ht 60.0 in | Wt 162.0 lb

## 2013-11-15 DIAGNOSIS — Z9889 Other specified postprocedural states: Secondary | ICD-10-CM

## 2013-11-15 DIAGNOSIS — M2042 Other hammer toe(s) (acquired), left foot: Secondary | ICD-10-CM

## 2013-11-15 DIAGNOSIS — M2040 Other hammer toe(s) (acquired), unspecified foot: Secondary | ICD-10-CM

## 2013-11-15 NOTE — Patient Instructions (Signed)
ICE INSTRUCTIONS  Apply ice or cold pack to the affected area at least 3 times a day for 10-15 minutes each time.  You should also use ice after prolonged activity or vigorous exercise.  Do not apply ice longer than 20 minutes at one time.  Always keep a cloth between your skin and the ice pack to prevent burns.  Being consistent and following these instructions will help control your symptoms.  We suggest you purchase a gel ice pack because they are reusable and do bit leak.  Some of them are designed to wrap around the area.  Use the method that works best for you.  Here are some other suggestions for icing.   Use a frozen bag of peas or corn-inexpensive and molds well to your body, usually stays frozen for 10 to 20 minutes.  Wet a towel with cold water and squeeze out the excess until it's damp.  Place in a bag in the freezer for 20 minutes. Then remove and use.  Maintain Coflex wrap of the fifth toe for least another 4 weeks this will help reduce the swelling. Maintain a loose fitting shoe

## 2013-11-15 NOTE — Progress Notes (Signed)
   Subjective:    Patient ID: Cindy Velasquez, female    DOB: 11-05-1941, 72 y.o.   MRN: [id number]  "It's doing well with the exception of the swelling.  It makes it hard to find a shoe to wear."  HPI    Review of Systems no new findings or changes     Objective:   Physical Exam Neurovascular status is intact pedal pulses palpable. Epicritic and proprioceptive sensations intact and symmetric bilateral. X-rays reveal good healing of the fifth digit osteotomy and exostectomy. Clinically there still some mild edema of the fifth toe patient is advised to continue with Coflex wrap in which is dispensed at this time for at least another 1 month. Patient also has some keratoses lateral surface of the fourth digit to the fifth toe under lapping. The keratotic lesion is debrided at this time and will maintain Coflex wrap in the fifth toe       Assessment & Plan:  Assessment good postop progress incision is well coapted and healed no dehiscence noted so some mild postoperative edema consistent patient advised he may last 3-6 months following hammertoe surgery. Suggest maintain Coflex wrapping from his mother month and maintain accommodative shoes recheck in 2 months for long-term followup  Alvan Dame DPM

## 2013-12-19 NOTE — Progress Notes (Signed)
1) Hammer toe repair 5th toe left foot

## 2014-01-13 LAB — HM MAMMOGRAPHY: HM Mammogram: NEGATIVE

## 2014-01-17 ENCOUNTER — Telehealth: Payer: Self-pay | Admitting: Family Medicine

## 2014-01-17 ENCOUNTER — Ambulatory Visit (INDEPENDENT_AMBULATORY_CARE_PROVIDER_SITE_OTHER): Payer: Medicare HMO

## 2014-01-17 VITALS — BP 154/75 | HR 75 | Resp 16

## 2014-01-17 DIAGNOSIS — Z9889 Other specified postprocedural states: Secondary | ICD-10-CM

## 2014-01-17 DIAGNOSIS — M2040 Other hammer toe(s) (acquired), unspecified foot: Secondary | ICD-10-CM

## 2014-01-17 NOTE — Progress Notes (Signed)
   Subjective:    Patient ID: Cindy Velasquez, female    DOB: [birth year], 73 y.o.   MRN: [id number]  HPI Comments: "Doing okay. 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 This may be associated with cold weather whether. Clinically good appearance no excessive edema no ecchymosis no signs of recurrence of deformity       Assessment & Plan:  Assessment good postop progress patient ready for discharge from our care to as-needed basis. May consider followup for contralateral drops of foot some point in the future on an as-needed basis  Harriet Masson DPM

## 2014-01-17 NOTE — Telephone Encounter (Signed)
Dos is today 01/17/14 735.4 is icd 9 Dr. Harriet Masson Ph [phone] fx [phone]

## 2014-01-17 NOTE — Patient Instructions (Signed)
Maintain comfortable straight lash shoes avoid entire constrictive  No restrictions on activities or exercises at this time. Discharged from our care unless you have any new problems or difficulties in the future.

## 2014-01-19 NOTE — Telephone Encounter (Signed)
Dr. Blenda Mounts is part of [facility]. Referral not needed.

## 2014-02-03 ENCOUNTER — Ambulatory Visit: Payer: Medicare PPO | Admitting: Family Medicine

## 2014-02-03 ENCOUNTER — Ambulatory Visit (INDEPENDENT_AMBULATORY_CARE_PROVIDER_SITE_OTHER): Payer: Medicare PPO | Admitting: Family Medicine

## 2014-02-03 ENCOUNTER — Encounter: Payer: Self-pay | Admitting: Family Medicine

## 2014-02-03 VITALS — BP 116/72 | HR 85 | Temp 98.1°F | Wt 165.0 lb

## 2014-02-03 DIAGNOSIS — M79609 Pain in unspecified limb: Secondary | ICD-10-CM

## 2014-02-03 DIAGNOSIS — E785 Hyperlipidemia, unspecified: Secondary | ICD-10-CM

## 2014-02-03 DIAGNOSIS — M79601 Pain in right arm: Secondary | ICD-10-CM

## 2014-02-03 MED ORDER — PREDNISONE 10 MG PO TABS: ORAL_TABLET | ORAL | Status: DC

## 2014-02-03 NOTE — Patient Instructions (Signed)

## 2014-02-03 NOTE — Progress Notes (Signed)
Patient ID: Cindy Velasquez, female   DOB: Mar 04, 1941, 73 y.o.   MRN: [id number]   Subjective:    Patient ID: Cindy Velasquez, female    DOB: Mar 30, 1941, 73 y.o.   MRN: [id number] HPI Pt here to f/u cholesterol and c/o R arm pain x 3 months.   She has stopped using it at gym  No known injury.  Pt is unable to lift arm to side.  Pt also c/o inc bruising with asa.  She stopped taking asa.           Objective:    BP 116/72  Pulse 85  Temp(Src) 98.1 F (36.7 C) (Oral)  Wt 165 lb (74.844 kg)  SpO2 96% General appearance: alert, cooperative, appears stated age and no distress Lungs: clear to auscultation bilaterally Heart: S1, S2 normal Extremities: extremities normal, atraumatic, no cyanosis or edema Neurologic: Motor: dec abduction R arm---past 90 degrees.  pt over deltoid insertion.       Assessment & Plan:  1. Other and unspecified hyperlipidemia Check labs con't meds - Hepatic function panel; Future - Lipid panel; Future - Basic metabolic panel; Future   2.  R arm pain pred taper Pt did not want/need anything for pain Refer to sport med or ortho if needed

## 2014-02-03 NOTE — Progress Notes (Signed)
Pre visit review using our clinic review tool, if applicable. No additional management support is needed unless otherwise documented below in the visit note. 

## 2014-02-07 ENCOUNTER — Other Ambulatory Visit (INDEPENDENT_AMBULATORY_CARE_PROVIDER_SITE_OTHER): Payer: Medicare PPO

## 2014-02-07 DIAGNOSIS — E785 Hyperlipidemia, unspecified: Secondary | ICD-10-CM

## 2014-02-07 LAB — LIPID PANEL
Cholesterol: 139 mg/dL (ref 0–200)
HDL: 51.8 mg/dL (ref >39.00)
LDL Cholesterol: 70 mg/dL (ref 0–99)
TRIGLYCERIDES: 84 mg/dL (ref 0.0–149.0)
Total CHOL/HDL Ratio: 3
VLDL: 16.8 mg/dL (ref 0.0–40.0)

## 2014-02-07 LAB — BASIC METABOLIC PANEL
BUN: 18 mg/dL (ref 6–23)
CO2: 25 mEq/L (ref 19–32)
CREATININE: 0.7 mg/dL (ref 0.4–1.2)
Calcium: 9.4 mg/dL (ref 8.4–10.5)
Chloride: 101 mEq/L (ref 96–112)
GFR: 84.5 mL/min (ref >60.00)
Glucose, Bld: 97 mg/dL (ref 70–99)
Potassium: 3.6 mEq/L (ref 3.5–5.1)
Sodium: 137 mEq/L (ref 135–145)

## 2014-02-07 LAB — HEPATIC FUNCTION PANEL
ALT: 29 U/L (ref 0–35)
AST: 23 U/L (ref 0–37)
Albumin: 3.7 g/dL (ref 3.5–5.2)
Alkaline Phosphatase: 60 U/L (ref 39–117)
Bilirubin, Direct: 0 mg/dL (ref 0.0–0.3)
TOTAL PROTEIN: 7.5 g/dL (ref 6.0–8.3)
Total Bilirubin: 0.6 mg/dL (ref 0.3–1.2)

## 2014-02-11 ENCOUNTER — Other Ambulatory Visit: Payer: Self-pay | Admitting: Family Medicine

## 2014-05-13 ENCOUNTER — Other Ambulatory Visit: Payer: Self-pay | Admitting: Family Medicine

## 2014-09-17 ENCOUNTER — Other Ambulatory Visit: Payer: Self-pay | Admitting: Family Medicine

## 2014-10-13 ENCOUNTER — Encounter: Payer: Self-pay | Admitting: Medical

## 2014-10-13 ENCOUNTER — Ambulatory Visit (INDEPENDENT_AMBULATORY_CARE_PROVIDER_SITE_OTHER): Payer: Medicare PPO | Admitting: Medical

## 2014-10-13 VITALS — BP 165/69 | HR 78 | Temp 98.3°F | Ht 60.0 in | Wt 165.4 lb

## 2014-10-13 DIAGNOSIS — R5383 Other fatigue: Secondary | ICD-10-CM | POA: Insufficient documentation

## 2014-10-13 DIAGNOSIS — L509 Urticaria, unspecified: Secondary | ICD-10-CM

## 2014-10-13 DIAGNOSIS — G629 Polyneuropathy, unspecified: Secondary | ICD-10-CM | POA: Insufficient documentation

## 2014-10-13 LAB — CBC WITH DIFFERENTIAL/PLATELET
BASOS ABS: 0.1 10*3/uL (ref 0.0–0.1)
BASOS PCT: 1 % (ref 0–1)
EOS PCT: 3 % (ref 0–5)
Eosinophils Absolute: 0.2 10*3/uL (ref 0.0–0.7)
HEMATOCRIT: 44.4 % (ref 36.0–46.0)
Hemoglobin: 14.4 g/dL (ref 12.0–15.0)
Lymphocytes Relative: 33 % (ref 12–46)
Lymphs Abs: 2.2 10*3/uL (ref 0.7–4.0)
MCH: 29.6 pg (ref 26.0–34.0)
MCHC: 32.4 g/dL (ref 30.0–36.0)
MCV: 91.2 fL (ref 78.0–100.0)
MONO ABS: 0.7 10*3/uL (ref 0.1–1.0)
MONOS PCT: 11 % (ref 3–12)
Neutro Abs: 3.5 10*3/uL (ref 1.7–7.7)
Neutrophils Relative %: 52 % (ref 43–77)
Platelets: 262 10*3/uL (ref 150–400)
RBC: 4.87 MIL/uL (ref 3.87–5.11)
RDW: 14.1 % (ref 11.5–15.5)
WBC: 6.7 10*3/uL (ref 4.0–10.5)

## 2014-10-13 MED ORDER — AMMONIUM LACTATE 12 % EX CREA: TOPICAL_CREAM | CUTANEOUS | Status: DC

## 2014-10-13 MED ORDER — HYDROXYZINE HCL 10 MG PO TABS: 10.0000 mg | ORAL_TABLET | Freq: Three times a day (TID) | ORAL | Status: DC | PRN

## 2014-10-13 NOTE — Assessment & Plan Note (Addendum)
Pt thinks she has neuropathy. I went ahead and ordered  b-12 and folate level.

## 2014-10-13 NOTE — Assessment & Plan Note (Signed)
Ordered cbc, cmp, tsh.

## 2014-10-13 NOTE — Progress Notes (Signed)
Pre visit review using our clinic review tool, if applicable. No additional management support is needed unless otherwise documented below in the visit note. 

## 2014-10-13 NOTE — Patient Instructions (Signed)
For your itching, I am going to give you hydroxyzine tablets and moisturizer lachydrin.  For you fatigue, I am ordering cbc, cmp, and tsh.  For your concern for neuropathy, I am ordering b-12 and folate.  Follow up in 2 weeks or as needed

## 2014-10-13 NOTE — Assessment & Plan Note (Signed)
Somewhat atypical complaints. Skin looks normal except for feeling of dry skin. Rx hydroxyzine and lac-hydrin.

## 2014-10-13 NOTE — Progress Notes (Signed)
Subjective:    Patient ID: Cindy Velasquez, female    DOB: 07/17/1941, 73 y.o.   MRN: [id number]  HPI   Pt in with scattered feeling of itching. Constant sensation of pinpricks of her skin. Pt states a lot weekend trips. Pt thinks she brought bed bugs into her house. House has been sprayed and treated.    Since then pt has pin prick sensation x 3wks.. Pt thinks she has systemic cause. She thinks b-12 or other cause. Or anemia. Maybe thryoid. She states she is tired/fatigued.  Pt states very hard to describe this sensation. Burning and itching to skin but various random location and very brief at times for seconds.  Pt has elimnated a lot of potential allergens. Changed shampoo. Pt change her determent.  Pt does not take her bp med regularly. No med today.  Past Medical History  Diagnosis Date  . Hypertension   . Hyperlipidemia   . Osteoporosis   . GERD (gastroesophageal reflux disease)   . Adenomatous colon polyp   . Hyperplastic colon polyp   . Diverticulosis of colon     History   Social History  . Marital Status: Married    Spouse Name: N/A    Number of Children: N/A  . Years of Education: N/A   Occupational History  . 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 Smoking status: Former Smoker -- 0.50 packs/day for 30 years    Types: Cigarettes    Quit date: 06/02/1991  . Smokeless tobacco: Never Used  . Alcohol Use: 3.0 oz/week    5 Glasses of wine per week  . Drug Use: No  . Sexual Activity:    Partners: Male   Other Topics Concern  . Not on file   Social History Narrative   Exercise--  [employer]--yoga and cardio 3x a week    Past Surgical History  Procedure Laterality Date  . Tubal ligation    . Hammer toe surgery      Bilateral  . Rotator cuff repair      Left  . Eye surgery  2012    B/L 01/2011  02/2011    Family History  Problem Relation Age of Onset  . Hypertension Mother   . Cancer Mother 39    ?  Marland Kitchen Hypertension  Father   . Stroke Father   . Hypertension Brother   . Cancer Brother 35    testicular   . Hypertension Brother   . Heart disease Brother     quadruple bypass    No Known Allergies  Current Outpatient Prescriptions on File Prior to Visit  Medication Sig Dispense Refill  . aspirin 81 MG tablet Take 81 mg by mouth daily.      . diphenhydramine-acetaminophen (TYLENOL PM) 25-500 MG TABS Take 1 tablet by mouth at bedtime as needed.      . hydrochlorothiazide (HYDRODIURIL) 25 MG tablet take 1/2 tablet by mouth once daily 45 tablet 5  . rosuvastatin (CRESTOR) 20 MG tablet Take 1 tablet (20 mg total) by mouth daily. Repeat labs are due now 30 tablet 0  . METRONIDAZOLE, TOPICAL, 0.75 % LOTN Use bid 1 Bottle 2  . NONFORMULARY OR COMPOUNDED ITEM Wheat grass 1 scoop mixed in water daily    . predniSONE (DELTASONE) 10 MG tablet 3 po qd for 3 days then 2 po qd for 3 days the 1 po qd for 3 days 18 tablet 0   No current facility-administered  medications on file prior to visit.    BP 165/69 mmHg  Pulse 78  Temp(Src) 98.3 F (36.8 C) (Oral)  Ht 5' (1.524 m)  Wt 165 lb 6.4 oz (75.025 kg)  BMI 32.30 kg/m2  SpO2 95%        Review of Systems  Constitutional: Positive for fatigue. Negative for fever and chills.  HENT: Negative.   Respiratory: Negative for cough, chest tightness, shortness of breath and wheezing.   Cardiovascular: Negative for chest pain and palpitations.  Genitourinary: Negative.   Musculoskeletal: Negative.   Skin:       Burn and itch various locations random and transient.  Neurological: Negative.  Negative for dizziness, tremors, seizures, syncope, facial asymmetry, speech difficulty, weakness, light-headedness, numbness and headaches.  Hematological: Negative for adenopathy. Does not bruise/bleed easily.  Psychiatric/Behavioral: Negative.        Objective:   Physical Exam  Constitutional: She is oriented to person, place, and time. She appears well-developed and  well-nourished. No distress.  HENT:  Head: Normocephalic and atraumatic.  Right Ear: External ear normal.  Left Ear: External ear normal.  Eyes: Conjunctivae and EOM are normal. Pupils are equal, round, and reactive to light. Right eye exhibits no discharge. Left eye exhibits no discharge. No scleral icterus.  Neck: Normal range of motion. Neck supple. No JVD present. No tracheal deviation present. No thyromegaly present.  Cardiovascular: Normal rate, regular rhythm and normal heart sounds.   Pulmonary/Chest: Effort normal and breath sounds normal. No stridor. No respiratory distress. She has no wheezes. She has no rales. She exhibits no tenderness.  Abdominal: Soft. Bowel sounds are normal. She exhibits no distension and no mass. There is no tenderness. There is no rebound and no guarding.  Lymphadenopathy:    She has no cervical adenopathy.  Neurological: She is alert and oriented to person, place, and time. No cranial nerve deficit. Coordination normal.  Skin: She is not diaphoretic.  No obvoius skin lestion or rash. But skin does feel little on dry side.  Psychiatric: She has a normal mood and affect. Her behavior is normal. Judgment and thought content normal.             Assessment & Plan:

## 2014-10-14 LAB — COMPREHENSIVE METABOLIC PANEL
ALK PHOS: 61 U/L (ref 39–117)
ALT: 26 U/L (ref 0–35)
AST: 25 U/L (ref 0–37)
Albumin: 3.8 g/dL (ref 3.5–5.2)
BUN: 26 mg/dL — ABNORMAL HIGH (ref 6–23)
CALCIUM: 9.6 mg/dL (ref 8.4–10.5)
CHLORIDE: 102 meq/L (ref 96–112)
CO2: 27 mEq/L (ref 19–32)
CREATININE: 0.73 mg/dL (ref 0.50–1.10)
Glucose, Bld: 82 mg/dL (ref 70–99)
Potassium: 3.9 mEq/L (ref 3.5–5.3)
Sodium: 141 mEq/L (ref 135–145)
Total Bilirubin: 0.5 mg/dL (ref 0.2–1.2)
Total Protein: 7.2 g/dL (ref 6.0–8.3)

## 2014-10-14 LAB — TSH: TSH: 1.813 u[IU]/mL (ref 0.350–4.500)

## 2014-10-14 LAB — VITAMIN B12: VITAMIN B 12: 534 pg/mL (ref 211–911)

## 2014-10-14 LAB — FOLATE

## 2014-10-27 ENCOUNTER — Encounter: Payer: Self-pay | Admitting: Medical

## 2014-10-27 ENCOUNTER — Ambulatory Visit (INDEPENDENT_AMBULATORY_CARE_PROVIDER_SITE_OTHER): Payer: Medicare PPO | Admitting: Medical

## 2014-10-27 VITALS — BP 168/78 | HR 78 | Temp 97.8°F | Ht 59.75 in | Wt 166.2 lb

## 2014-10-27 DIAGNOSIS — L509 Urticaria, unspecified: Secondary | ICD-10-CM

## 2014-10-27 MED ORDER — LISINOPRIL 10 MG PO TABS: 10.0000 mg | ORAL_TABLET | Freq: Every day | ORAL | Status: DC

## 2014-10-27 NOTE — Progress Notes (Signed)
Subjective:    Patient ID: Cindy Velasquez, female    DOB: Mar 08, 1941, 73 y.o.   MRN: [id number]  HPI   Pt in for rash with itching. She only used hydroxyzine bid due to drowsiness. It did stop the itching. Decreased by about 85%. Pt did use lac-hydrin. And this seemed to help.  Pt labs were all normal.  Pt bp has been elevated twice in a row. Pt states her reading recently have been 143 systollc. No chest pain or neurologic symptoms.  Some moderate stress.  Pt is not fasting today. Has history of hyperlipidemia.  Past Medical History  Diagnosis Date  . Hypertension   . Hyperlipidemia   . Osteoporosis   . GERD (gastroesophageal reflux disease)   . Adenomatous colon polyp   . Hyperplastic colon polyp   . Diverticulosis of colon     History   Social History  . Marital Status: Married    Spouse Name: N/A    Number of Children: N/A  . Years of Education: N/A   Occupational History  . 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 Smoking status: Former Smoker -- 0.50 packs/day for 30 years    Types: Cigarettes    Quit date: 06/02/1991  . Smokeless tobacco: Never Used  . Alcohol Use: 3.0 oz/week    5 Glasses of wine per week  . Drug Use: No  . Sexual Activity:    Partners: Male   Other Topics Concern  . Not on file   Social History Narrative   Exercise--  [employer]--yoga and cardio 3x a week    Past Surgical History  Procedure Laterality Date  . Tubal ligation    . Hammer toe surgery      Bilateral  . Rotator cuff repair      Left  . Eye surgery  2012    B/L 01/2011  02/2011    Family History  Problem Relation Age of Onset  . Hypertension Mother   . Cancer Mother 61    ?  Marland Kitchen Hypertension Father   . Stroke Father   . Hypertension Brother   . Cancer Brother 35    testicular   . Hypertension Brother   . Heart disease Brother     quadruple bypass    No Known Allergies  Current Outpatient Prescriptions on File Prior to  Visit  Medication Sig Dispense Refill  . ammonium lactate (LAC-HYDRIN) 12 % cream Apply to skin bid 385 g 0  . aspirin 81 MG tablet Take 81 mg by mouth daily.      . diphenhydramine-acetaminophen (TYLENOL PM) 25-500 MG TABS Take 1 tablet by mouth at bedtime as needed.      . hydrochlorothiazide (HYDRODIURIL) 25 MG tablet take 1/2 tablet by mouth once daily 45 tablet 5  . hydrOXYzine (ATARAX/VISTARIL) 10 MG tablet Take 1 tablet (10 mg total) by mouth 3 (three) times daily as needed for itching. 21 tablet 0  . METRONIDAZOLE, TOPICAL, 0.75 % LOTN Use bid 1 Bottle 2  . NONFORMULARY OR COMPOUNDED ITEM Wheat grass 1 scoop mixed in water daily    . predniSONE (DELTASONE) 10 MG tablet 3 po qd for 3 days then 2 po qd for 3 days the 1 po qd for 3 days 18 tablet 0  . rosuvastatin (CRESTOR) 20 MG tablet Take 1 tablet (20 mg total) by mouth daily. Repeat labs are due now 30 tablet 0   No current facility-administered medications on  file prior to visit.    BP 168/78 mmHg  Pulse 78  Temp(Src) 97.8 F (36.6 C) (Oral)  Ht 4' 11.75" (1.518 m)  Wt 166 lb 3.2 oz (75.388 kg)  BMI 32.72 kg/m2  SpO2 96%          Review of Systems  Constitutional: Negative for fever, chills and fatigue.  HENT: Negative.   Respiratory: Negative for cough, chest tightness, shortness of breath and wheezing.   Cardiovascular: Negative.   Gastrointestinal: Negative for vomiting.  Genitourinary: Negative for dysuria and flank pain.  Musculoskeletal: Negative for back pain.  Skin: Positive for rash.       85% improved itching. Rash really never present.  Neurological: Negative for dizziness, tremors, seizures, syncope, weakness, light-headedness, numbness and headaches.  Hematological: Negative for adenopathy. Does not bruise/bleed easily.  Psychiatric/Behavioral: Negative for suicidal ideas, behavioral problems and dysphoric mood. The patient is not nervous/anxious.        Objective:   Physical  Exam  General Mental Status- Alert. General Appearance- Not in acute distress.   Skin General: Color- Normal Color. Moisture- Normal Moisture. NO rash present.  Neck Carotid Arteries- Normal color. Moisture- Normal Moisture. No carotid bruits. No JVD.  Chest and Lung Exam Auscultation: Breath Sounds:-Normal.  Cardiovascular Auscultation:Rythm- Regular. Murmurs & Other Heart Sounds:Auscultation of the heart reveals- No Murmurs.  Abdomen Inspection:-Inspeection Normal. Palpation/Percussion:Note:No mass. Palpation and Percussion of the abdomen reveal- Non Tender, Non Distended + BS, no rebound or guarding.    Neurologic Cranial Nerve exam:- CN III-XII intact(No nystagmus), symmetric smile. Romberg Exam:- Negative.  Strength:- 5/5 equal and symmetric strength both upper and lower extremities.       Assessment & Plan:

## 2014-10-27 NOTE — Assessment & Plan Note (Signed)
Your itching of skin is much improved. Continue the lac-hydrin. Stop hydroxyzine and just use claritin otc for itching if needed. If reoccurs again as before also offered pt dermatologist referral.

## 2014-10-27 NOTE — Assessment & Plan Note (Signed)
For your bp, I want you to go ahead and start lisinopril 10 mg in junction with your hctz. BP not well controlled last 3 visits.

## 2014-10-27 NOTE — Progress Notes (Signed)
Pre visit review using our clinic review tool, if applicable. No additional management support is needed unless otherwise documented below in the visit note. 

## 2014-10-27 NOTE — Patient Instructions (Signed)
Your itching of skin is much improved. Continue the lac-hydrin. Stop hydroxyzine and just use claritin otc for itching if needed.  For your bp, I want you to go ahead and start lisinopril 10 mg in junction with your hctz.  On follow up in 1 month  come in fasting so we can get lipid panel.

## 2014-11-17 ENCOUNTER — Other Ambulatory Visit: Payer: Self-pay | Admitting: Family Medicine

## 2014-11-27 ENCOUNTER — Ambulatory Visit (INDEPENDENT_AMBULATORY_CARE_PROVIDER_SITE_OTHER): Payer: Commercial Managed Care - HMO | Admitting: Family Medicine

## 2014-11-27 ENCOUNTER — Encounter: Payer: Self-pay | Admitting: Family Medicine

## 2014-11-27 VITALS — BP 148/82 | HR 68 | Temp 98.0°F | Wt 166.4 lb

## 2014-11-27 DIAGNOSIS — I10 Essential (primary) hypertension: Secondary | ICD-10-CM | POA: Diagnosis not present

## 2014-11-27 DIAGNOSIS — L853 Xerosis cutis: Secondary | ICD-10-CM

## 2014-11-27 LAB — BASIC METABOLIC PANEL
BUN: 20 mg/dL (ref 6–23)
CHLORIDE: 98 meq/L (ref 96–112)
CO2: 31 mEq/L (ref 19–32)
Calcium: 9.1 mg/dL (ref 8.4–10.5)
Creatinine, Ser: 0.7 mg/dL (ref 0.4–1.2)
GFR: 82.98 mL/min (ref >60.00)
GLUCOSE: 65 mg/dL — AB (ref 70–99)
POTASSIUM: 3.6 meq/L (ref 3.5–5.1)
Sodium: 137 mEq/L (ref 135–145)

## 2014-11-27 MED ORDER — HYDROCHLOROTHIAZIDE 25 MG PO TABS: 25.0000 mg | ORAL_TABLET | Freq: Every day | ORAL | Status: DC

## 2014-11-27 MED ORDER — LISINOPRIL 10 MG PO TABS: 10.0000 mg | ORAL_TABLET | Freq: Every day | ORAL | Status: DC

## 2014-11-27 NOTE — Progress Notes (Signed)
Pre visit review using our clinic review tool, if applicable. No additional management support is needed unless otherwise documented below in the visit note. 

## 2014-11-27 NOTE — Patient Instructions (Signed)

## 2014-11-27 NOTE — Progress Notes (Signed)
As     Patient ID: Cindy Velasquez, female    DOB: 1941-06-14, 73 y.o.   MRN: [id number]  HPI Pt here f/u "rash"  From a month ago.  Her bp was found to be high as well.  Rash was actually dry skin but meds seemed to help.   htn improved but still running high.  No chest pain, sob.  Etc    Review of Systems    as above Objective:   Physical Exam  BP 148/82 mmHg  Pulse 68  Temp(Src) 98 F (36.7 C) (Oral)  Wt 166 lb 6.4 oz (75.479 kg)  SpO2 97% General appearance: alert, cooperative, appears stated age and no distress Neck: no adenopathy, no JVD, supple, symmetrical, trachea midline and thyroid not enlarged, symmetric, no tenderness/mass/nodules Lungs: clear to auscultation bilaterally Heart: S1, S2 normal Extremities: extremities normal, atraumatic, no cyanosis or edema Skin: Skin color, texture, turgor normal. No rashes or lesions       Assessment & Plan:  1. Essential hypertension Elevated-- rto 3 months - hydrochlorothiazide (HYDRODIURIL) 25 MG tablet; Take 1 tablet (25 mg total) by mouth daily.  Dispense: 90 tablet; Refill: 3 - lisinopril (PRINIVIL,ZESTRIL) 10 MG tablet; Take 1 tablet (10 mg total) by mouth daily.  Dispense: 30 tablet; Refill: 3 - Basic metabolic panel  2. Dry skin amilactin or eucerin cream rto prn

## 2015-01-01 ENCOUNTER — Telehealth: Payer: Self-pay | Admitting: Family Medicine

## 2015-01-01 NOTE — Telephone Encounter (Signed)
Caller name: Arwilda Relation to pt: self  Call back number: [phone] Pharmacy:  Reason for call:   Patient requesting a referral to a chiropractor. Whomever Dr. Etter Sjogren recommends is fine. She has [employer]. Lower back and right buttocks pulled muscle. Offered appointment, patient declined stating that she does not want any pills. Patient wants to schedule this referral appointment herself.

## 2015-01-01 NOTE — Telephone Encounter (Signed)
To MD for approval.     KP

## 2015-01-01 NOTE — Telephone Encounter (Signed)
She can call Dr Raynelle Chary

## 2015-01-04 NOTE — Telephone Encounter (Signed)
Does this need an actual referral?

## 2015-01-08 ENCOUNTER — Other Ambulatory Visit: Payer: Self-pay | Admitting: Family Medicine

## 2015-02-13 ENCOUNTER — Encounter: Payer: Self-pay | Admitting: Family Medicine

## 2015-02-13 ENCOUNTER — Ambulatory Visit (INDEPENDENT_AMBULATORY_CARE_PROVIDER_SITE_OTHER): Payer: Commercial Managed Care - HMO | Admitting: Family Medicine

## 2015-02-13 VITALS — BP 136/74 | HR 83 | Temp 98.3°F | Wt 166.2 lb

## 2015-02-13 DIAGNOSIS — D229 Melanocytic nevi, unspecified: Secondary | ICD-10-CM

## 2015-02-13 DIAGNOSIS — I10 Essential (primary) hypertension: Secondary | ICD-10-CM

## 2015-02-13 DIAGNOSIS — L853 Xerosis cutis: Secondary | ICD-10-CM

## 2015-02-13 DIAGNOSIS — E785 Hyperlipidemia, unspecified: Secondary | ICD-10-CM | POA: Diagnosis not present

## 2015-02-13 MED ORDER — LISINOPRIL 20 MG PO TABS: 20.0000 mg | ORAL_TABLET | Freq: Every day | ORAL | Status: DC

## 2015-02-13 MED ORDER — HYDROCHLOROTHIAZIDE 25 MG PO TABS: ORAL_TABLET | ORAL | Status: DC

## 2015-02-13 MED ORDER — AMMONIUM LACTATE 12 % EX CREA: TOPICAL_CREAM | CUTANEOUS | Status: DC

## 2015-02-13 NOTE — Progress Notes (Signed)
Pre visit review using our clinic review tool, if applicable. No additional management support is needed unless otherwise documented below in the visit note. 

## 2015-02-13 NOTE — Patient Instructions (Signed)

## 2015-02-13 NOTE — Progress Notes (Signed)
Patient ID: Cindy Velasquez, female    DOB: Aug 26, 1941  Age: 74 y.o. MRN: [id number]    Subjective:  Subjective HPI Cindy Velasquez presents for htn f/u and cholesterol.    Review of Systems  Constitutional: Positive for fatigue. Negative for activity change, appetite change and unexpected weight change.  Respiratory: Negative for cough and shortness of breath.   Cardiovascular: Negative for chest pain and palpitations.  Psychiatric/Behavioral: Negative for behavioral problems and dysphoric mood. The patient is not nervous/anxious.     History Past Medical History  Diagnosis Date  . Hypertension   . Hyperlipidemia   . Osteoporosis   . GERD (gastroesophageal reflux disease)   . Adenomatous colon polyp   . Hyperplastic colon polyp   . Diverticulosis of colon     She has past surgical history that includes Tubal ligation; Hammer toe surgery; Rotator cuff repair; and Eye surgery (2012).   Her family history includes Cancer (age of onset: 48) in her brother; Cancer (age of onset: 30) in her mother; Heart disease in her brother; Hypertension in her brother, brother, father, and mother; Stroke in her father.She reports that she quit smoking about 23 years ago. Her smoking use included Cigarettes. She has a 15 pack-year smoking history. She has never used smokeless tobacco. She reports that she drinks about 3.0 oz of alcohol per week. She reports that she does not use illicit drugs.  Current Outpatient Prescriptions on File Prior to Visit  Medication Sig Dispense Refill  . CRESTOR 20 MG tablet take 1 tablet by mouth once daily (REPEAT LABS ARE DUE NOW) 30 tablet 1   No current facility-administered medications on file prior to visit.     Objective:  Objective Physical Exam  Constitutional: She is oriented to person, place, and time. She appears well-developed and well-nourished. No distress.  HENT:  Right Ear: External ear normal.  Left Ear: External ear normal.  Nose: Nose normal.    Mouth/Throat: Oropharynx is clear and moist.  Eyes: EOM are normal. Pupils are equal, round, and reactive to light.  Neck: Normal range of motion. Neck supple.  Cardiovascular: Normal rate, regular rhythm and normal heart sounds.   No murmur heard. Pulmonary/Chest: Effort normal and breath sounds normal. No respiratory distress. She has no wheezes. She has no rales. She exhibits no tenderness.  Neurological: She is alert and oriented to person, place, and time.  Skin: Skin is dry. Rash noted.  Psychiatric: She has a normal mood and affect. Her behavior is normal. Judgment and thought content normal.   BP 136/74 mmHg  Pulse 83  Temp(Src) 98.3 F (36.8 C) (Oral)  Wt 166 lb 3.2 oz (75.388 kg)  SpO2 95% Wt Readings from Last 3 Encounters:  02/13/15 166 lb 3.2 oz (75.388 kg)  11/27/14 166 lb 6.4 oz (75.479 kg)  10/27/14 166 lb 3.2 oz (75.388 kg)     Lab Results  Component Value Date   WBC 6.7 10/13/2014   HGB 14.4 10/13/2014   HCT 44.4 10/13/2014   PLT 262 10/13/2014   GLUCOSE 65* 11/27/2014   CHOL 139 02/07/2014   TRIG 84.0 02/07/2014   HDL 51.80 02/07/2014   LDLDIRECT 129.1 08/23/2007   LDLCALC 70 02/07/2014   ALT 26 10/13/2014   AST 25 10/13/2014   NA 137 11/27/2014   K 3.6 11/27/2014   CL 98 11/27/2014   CREATININE 0.7 11/27/2014   BUN 20 11/27/2014   CO2 31 11/27/2014   TSH 1.813 10/13/2014  No results found.   Assessment & Plan:  Plan I have discontinued Ms. Muto lisinopril. I have also changed her hydrochlorothiazide. Additionally, I am having her start on lisinopril. Lastly, I am having her maintain her CRESTOR and ammonium lactate.  Meds ordered this encounter  Medications  . lisinopril (PRINIVIL,ZESTRIL) 20 MG tablet    Sig: Take 1 tablet (20 mg total) by mouth daily.    Dispense:  90 tablet    Refill:  3  . hydrochlorothiazide (HYDRODIURIL) 25 MG tablet    Sig: 1/2 tab po qam    Dispense:  45 tablet    Refill:  3  . ammonium lactate  (LAC-HYDRIN) 12 % cream    Sig: Apply to skin bid    Dispense:  385 g    Refill:  0    Problem List Items Addressed This Visit    None    Visit Diagnoses    Essential hypertension    -  Primary    Relevant Medications    lisinopril (PRINIVIL,ZESTRIL) tablet    hydrochlorothiazide tablet    Other Relevant Orders    Basic metabolic panel    Dry skin        Relevant Medications    ammonium lactate (LAC-HYDRIN) 12 % cream    Hyperlipidemia        Relevant Medications    lisinopril (PRINIVIL,ZESTRIL) tablet    hydrochlorothiazide tablet    Other Relevant Orders    Hepatic function panel    Lipid panel    Nevus of multiple sites        Relevant Orders    Ambulatory referral to Dermatology       Follow-up: Return in about 3 months (around 05/16/2015), or if symptoms worsen or fail to improve, for bp f/u--- labs this week.  Garnet Koyanagi, DO

## 2015-02-13 NOTE — Assessment & Plan Note (Signed)
Inc lisinopril 20 mg daily  Dec hctz to 12.5 daily Recheck 3 months

## 2015-02-13 NOTE — Assessment & Plan Note (Signed)
   Check labs  con't crestor

## 2015-02-19 ENCOUNTER — Other Ambulatory Visit (INDEPENDENT_AMBULATORY_CARE_PROVIDER_SITE_OTHER): Payer: Commercial Managed Care - HMO

## 2015-02-19 DIAGNOSIS — E785 Hyperlipidemia, unspecified: Secondary | ICD-10-CM | POA: Diagnosis not present

## 2015-02-19 DIAGNOSIS — I10 Essential (primary) hypertension: Secondary | ICD-10-CM | POA: Diagnosis not present

## 2015-02-19 LAB — BASIC METABOLIC PANEL
BUN: 18 mg/dL (ref 6–23)
CO2: 29 meq/L (ref 19–32)
Calcium: 9.1 mg/dL (ref 8.4–10.5)
Chloride: 105 mEq/L (ref 96–112)
Creatinine, Ser: 0.73 mg/dL (ref 0.40–1.20)
GFR: 82.93 mL/min (ref >60.00)
GLUCOSE: 91 mg/dL (ref 70–99)
Potassium: 3.7 mEq/L (ref 3.5–5.1)
Sodium: 140 mEq/L (ref 135–145)

## 2015-02-19 LAB — LIPID PANEL
Cholesterol: 143 mg/dL (ref 0–200)
HDL: 41.8 mg/dL (ref >39.00)
LDL CALC: 68 mg/dL (ref 0–99)
NonHDL: 101.2
Total CHOL/HDL Ratio: 3
Triglycerides: 165 mg/dL — ABNORMAL HIGH (ref 0.0–149.0)
VLDL: 33 mg/dL (ref 0.0–40.0)

## 2015-02-19 LAB — HEPATIC FUNCTION PANEL
ALT: 35 U/L (ref 0–35)
AST: 32 U/L (ref 0–37)
Albumin: 3.8 g/dL (ref 3.5–5.2)
Alkaline Phosphatase: 72 U/L (ref 39–117)
BILIRUBIN DIRECT: 0.1 mg/dL (ref 0.0–0.3)
BILIRUBIN TOTAL: 0.5 mg/dL (ref 0.2–1.2)
Total Protein: 7.2 g/dL (ref 6.0–8.3)

## 2015-02-20 ENCOUNTER — Telehealth: Payer: Self-pay | Admitting: Family Medicine

## 2015-02-20 NOTE — Telephone Encounter (Signed)
I did not call the patient,  a Dermatology referral has been placed and it may have been a [facility] that called.      KP

## 2015-02-20 NOTE — Telephone Encounter (Signed)
Caller name:Gfeller, Tarhonda Relation to MM:NOTR Call back [phone] Pharmacy:  Reason for call: pt states she is returning your call please call back

## 2015-03-06 ENCOUNTER — Other Ambulatory Visit: Payer: Self-pay | Admitting: Family Medicine

## 2015-03-09 ENCOUNTER — Other Ambulatory Visit: Payer: Self-pay

## 2015-03-09 DIAGNOSIS — C801 Malignant (primary) neoplasm, unspecified: Secondary | ICD-10-CM

## 2015-03-09 DIAGNOSIS — I10 Essential (primary) hypertension: Secondary | ICD-10-CM

## 2015-03-09 HISTORY — DX: Malignant (primary) neoplasm, unspecified: C80.1

## 2015-03-14 ENCOUNTER — Encounter: Payer: Self-pay | Admitting: Internal Medicine

## 2015-03-14 ENCOUNTER — Other Ambulatory Visit: Payer: Self-pay

## 2015-03-19 ENCOUNTER — Other Ambulatory Visit: Payer: Self-pay | Admitting: Dermatology

## 2015-03-19 DIAGNOSIS — D1801 Hemangioma of skin and subcutaneous tissue: Secondary | ICD-10-CM | POA: Diagnosis not present

## 2015-03-19 DIAGNOSIS — D225 Melanocytic nevi of trunk: Secondary | ICD-10-CM | POA: Diagnosis not present

## 2015-03-19 DIAGNOSIS — Z85828 Personal history of other malignant neoplasm of skin: Secondary | ICD-10-CM | POA: Diagnosis not present

## 2015-03-19 DIAGNOSIS — C44712 Basal cell carcinoma of skin of right lower limb, including hip: Secondary | ICD-10-CM | POA: Diagnosis not present

## 2015-03-19 DIAGNOSIS — L821 Other seborrheic keratosis: Secondary | ICD-10-CM | POA: Diagnosis not present

## 2015-04-23 ENCOUNTER — Ambulatory Visit (INDEPENDENT_AMBULATORY_CARE_PROVIDER_SITE_OTHER): Payer: Commercial Managed Care - HMO | Admitting: Family Medicine

## 2015-04-23 ENCOUNTER — Encounter: Payer: Self-pay | Admitting: Family Medicine

## 2015-04-23 VITALS — BP 122/70 | HR 87 | Temp 98.7°F | Ht 60.0 in | Wt 163.2 lb

## 2015-04-23 DIAGNOSIS — M25562 Pain in left knee: Secondary | ICD-10-CM

## 2015-04-23 DIAGNOSIS — M25561 Pain in right knee: Secondary | ICD-10-CM

## 2015-04-23 DIAGNOSIS — Z1231 Encounter for screening mammogram for malignant neoplasm of breast: Secondary | ICD-10-CM

## 2015-04-23 DIAGNOSIS — Z0000 Encounter for general adult medical examination without abnormal findings: Secondary | ICD-10-CM

## 2015-04-23 DIAGNOSIS — Z23 Encounter for immunization: Secondary | ICD-10-CM | POA: Diagnosis not present

## 2015-04-23 NOTE — Progress Notes (Signed)
.  Subjective:   Cindy Velasquez is a 74 y.o. female who presents for Medicare Annual (Subsequent) preventive examination.  Review of Systems:   Review of Systems  Constitutional: Negative for activity change, appetite change and fatigue.  HENT: Negative for hearing loss, congestion, tinnitus and ear discharge.   Eyes: Negative for visual disturbance (see optho q1y -- vision corrected to 20/20 with glasses).  Respiratory: Negative for cough, chest tightness and shortness of breath.   Cardiovascular: Negative for chest pain, palpitations and leg swelling.  Gastrointestinal: Negative for abdominal pain, diarrhea, constipation and abdominal distention.  Genitourinary: Negative for urgency, frequency, decreased urine volume and difficulty urinating.  Musculoskeletal: Negative for back pain, arthralgias and gait problem.  Skin: Negative for color change, pallor and rash.  Neurological: Negative for dizziness, light-headedness, numbness and headaches.  Hematological: Negative for adenopathy. Does not bruise/bleed easily.  Psychiatric/Behavioral: Negative for suicidal ideas, confusion, sleep disturbance, self-injury, dysphoric mood, decreased concentration and agitation.  Pt is able to read and write and can do all ADLs No risk for falling No abuse/ violence in home           Objective:     Vitals: BP 122/70 mmHg  Pulse 87  Temp(Src) 98.7 F (37.1 C) (Oral)  Ht 5' (1.524 m)  Wt 163 lb 3.2 oz (74.027 kg)  BMI 31.87 kg/m2  SpO2 97% BP 122/70 mmHg  Pulse 87  Temp(Src) 98.7 F (37.1 C) (Oral)  Ht 5' (1.524 m)  Wt 163 lb 3.2 oz (74.027 kg)  BMI 31.87 kg/m2  SpO2 97% General appearance: alert, cooperative, appears stated age and no distress Head: Normocephalic, without obvious abnormality, atraumatic Eyes: conjunctivae/corneas clear. PERRL, EOM's intact. Fundi benign. Ears: normal TM's and external ear canals both ears Nose: Nares normal. Septum midline. Mucosa normal. No drainage  or sinus tenderness. Throat: lips, mucosa, and tongue normal; teeth and gums normal Neck: no adenopathy, supple, symmetrical, trachea midline and thyroid not enlarged, symmetric, no tenderness/mass/nodules Back: symmetric, no curvature. ROM normal. No CVA tenderness. Lungs: clear to auscultation bilaterally Breasts: normal appearance, no masses or tenderness Heart: regular rate and rhythm, S1, S2 normal, no murmur, click, rub or gallop Abdomen: soft, non-tender; bowel sounds normal; no masses,  no organomegaly Pelvic: not indicated; post-menopausal, no abnormal Pap smears in past Extremities: extremities normal, atraumatic, no cyanosis or edema Pulses: 2+ and symmetric Skin: Skin color, texture, turgor normal. No rashes or lesions Lymph nodes: Cervical, supraclavicular, and axillary nodes normal. Neurologic: Alert and oriented X 3, normal strength and tone. Normal symmetric reflexes. Normal coordination and gait Tobacco History  Smoking status  . Former Smoker -- 0.50 packs/day for 30 years  . Types: Cigarettes  . Quit date: 06/02/1991  Smokeless tobacco  . Never Used     Counseling given: Not Answered   Past Medical History  Diagnosis Date  . Hypertension   . Hyperlipidemia   . Osteoporosis   . GERD (gastroesophageal reflux disease)   . Adenomatous colon polyp   . Hyperplastic colon polyp   . Diverticulosis of colon    Past Surgical History  Procedure Laterality Date  . Tubal ligation    . Hammer toe surgery      Bilateral  . Rotator cuff repair      Left  . Eye surgery  2012    B/L 01/2011  02/2011   Family History  Problem Relation Age of Onset  . Hypertension Mother   . Cancer Mother 46    ?  Marland Kitchen  Hypertension Father   . Stroke Father   . Hypertension Brother   . Cancer Brother 35    testicular   . Hypertension Brother   . Heart disease Brother     quadruple bypass   History  Sexual Activity  . Sexual Activity:  . Partners: Male    Outpatient Encounter  Prescriptions as of 04/23/2015  Medication Sig  . ammonium lactate (LAC-HYDRIN) 12 % cream Apply to skin bid  . CRESTOR 20 MG tablet take 1 tablet by mouth once daily  . hydrochlorothiazide (HYDRODIURIL) 25 MG tablet 1/2 tab po qam  . lisinopril (PRINIVIL,ZESTRIL) 20 MG tablet Take 1 tablet (20 mg total) by mouth daily.   No facility-administered encounter medications on file as of 04/23/2015.    Activities of Daily Living In your present state of health, do you have any difficulty performing the following activities: 11/27/2014  Hearing? N  Vision? N  Difficulty concentrating or making decisions? N  Walking or climbing stairs? N  Dressing or bathing? N  Doing errands, shopping? N    Patient Care Team: Rosalita Chessman, DO as PCP - General    Assessment:    cpe Exercise Activities and Dietary recommendations-- con't yoga and ymca    Goals    None     Fall Risk Fall Risk  11/27/2014 08/01/2013  Falls in the past year? No No   Depression Screen PHQ 2/9 Scores 11/27/2014 08/01/2013 06/22/2012  PHQ - 2 Score 0 0 0     Cognitive Testing AAOx3 NAD Good memory   Immunization History  Administered Date(s) Administered  . H1N1 12/28/2008  . Influenza Whole 10/20/2007, 10/18/2008, 11/15/2012  . Influenza-Unspecified 12/20/2013  . Pneumococcal Conjugate-13 04/23/2015  . Pneumococcal Polysaccharide-23 12/28/2008  . Tdap 08/01/2013   Screening Tests Health Maintenance  Topic Date Due  . MAMMOGRAM  05/24/2015 (Originally 01/13/2015)  . INFLUENZA VACCINE  02/13/2016 (Originally 07/09/2015)  . COLONOSCOPY  05/07/2020  . TETANUS/TDAP  08/02/2023  . DEXA SCAN  Completed  . ZOSTAVAX  Completed  . PNA vac Low Risk Adult  Completed      Plan:    see AVS During the course of the visit the patient was educated and counseled about the following appropriate screening and preventive services:   Vaccines to include Pneumoccal, Influenza, Hepatitis B, Td, Zostavax,  HCV  Electrocardiogram  Cardiovascular Disease  Colorectal cancer screening  Bone density screening  Diabetes screening  Glaucoma screening  Mammography/PAP  Nutrition counseling   Patient Instructions (the written plan) was given to the patient.  1. Encounter for screening mammogram for malignant neoplasm of breast   - MM DIGITAL SCREENING BILATERAL; Future  2. Need for pneumococcal vaccine   - Pneumococcal conjugate vaccine 13-valent  3. Medicare annual wellness visit, subsequent     4. Knee pain, bilateral Pt would like to hold off on referral until later  5. Routine history and physical examination of adult     Garnet Koyanagi, DO  04/23/2015

## 2015-04-23 NOTE — Assessment & Plan Note (Signed)
Pt will hold off on referral for now She will call if symptoms worsen and we will refer to ortho

## 2015-04-23 NOTE — Patient Instructions (Addendum)
Your Mammogram has been scheduled for Monday 04/30/15 at 3 pm at [facility]. Address: [address], [place], [place] [address]  [phone] Preventive Care for Adults A healthy lifestyle and preventive care can promote health and wellness. Preventive health guidelines for women include the following key practices.  A routine yearly physical is a good way to check with your health care provider about your health and preventive screening. It is a chance to share any concerns and updates on your health and to receive a thorough exam.  Visit your dentist for a routine exam and preventive care every 6 months. Brush your teeth twice a day and floss once a day. Good oral hygiene prevents tooth decay and gum disease.  The frequency of eye exams is based on your age, health, family medical history, use of contact lenses, and other factors. Follow your health care provider's recommendations for frequency of eye exams.  Eat a healthy diet. Foods like vegetables, fruits, whole grains, low-fat dairy products, and lean protein foods contain the nutrients you need without too many calories. Decrease your intake of foods high in solid fats, added sugars, and salt. Eat the right amount of calories for you.Get information about a proper diet from your health care provider, if necessary.  Regular physical exercise is one of the most important things you can do for your health. Most adults should get at least 150 minutes of moderate-intensity exercise (any activity that increases your heart rate and causes you to sweat) each week. In addition, most adults need muscle-strengthening exercises on 2 or more days a week.  Maintain a healthy weight. The body mass index (BMI) is a screening tool to identify possible weight problems. It provides an estimate of body fat based on height and weight. Your health care provider can find your BMI and can help you achieve or maintain a healthy weight.For adults 20 years  and older:  A BMI below 18.5 is considered underweight.  A BMI of 18.5 to 24.9 is normal.  A BMI of 25 to 29.9 is considered overweight.  A BMI of 30 and above is considered obese.  Maintain normal blood lipids and cholesterol levels by exercising and minimizing your intake of saturated fat. Eat a balanced diet with plenty of fruit and vegetables. Blood tests for lipids and cholesterol should begin at age 92 and be repeated every 5 years. If your lipid or cholesterol levels are high, you are over 50, or you are at high risk for heart disease, you may need your cholesterol levels checked more frequently.Ongoing high lipid and cholesterol levels should be treated with medicines if diet and exercise are not working.  If you smoke, find out from your health care provider how to quit. If you do not use tobacco, do not start.  Lung cancer screening is recommended for adults aged 43-80 years who are at high risk for developing lung cancer because of a history of smoking. A yearly low-dose CT scan of the lungs is recommended for people who have at least a 30-pack-year history of smoking and are a current smoker or have quit within the past 15 years. A pack year of smoking is smoking an average of 1 pack of cigarettes a day for 1 year (for example: 1 pack a day for 30 years or 2 packs a day for 15 years). Yearly screening should continue until the smoker has stopped smoking for at least 15 years. Yearly screening should be stopped for people who develop  a health problem that would prevent them from having lung cancer treatment.  If you are pregnant, do not drink alcohol. If you are breastfeeding, be very cautious about drinking alcohol. If you are not pregnant and choose to drink alcohol, do not have more than 1 drink per day. One drink is considered to be 12 ounces (355 mL) of beer, 5 ounces (148 mL) of wine, or 1.5 ounces (44 mL) of liquor.  Avoid use of street drugs. Do not share needles with anyone.  Ask for help if you need support or instructions about stopping the use of drugs.  High blood pressure causes heart disease and increases the risk of stroke. Your blood pressure should be checked at least every 1 to 2 years. Ongoing high blood pressure should be treated with medicines if weight loss and exercise do not work.  If you are 4-61 years old, ask your health care provider if you should take aspirin to prevent strokes.  Diabetes screening involves taking a blood sample to check your fasting blood sugar level. This should be done once every 3 years, after age 58, if you are within normal weight and without risk factors for diabetes. Testing should be considered at a younger age or be carried out more frequently if you are overweight and have at least 1 risk factor for diabetes.  Breast cancer screening is essential preventive care for women. You should practice "breast self-awareness." This means understanding the normal appearance and feel of your breasts and may include breast self-examination. Any changes detected, no matter how small, should be reported to a health care provider. Women in their 74s and 30s should have a clinical breast exam (CBE) by a health care provider as part of a regular health exam every 1 to 3 years. After age 74, women should have a CBE every year. Starting at age 105, women should consider having a mammogram (breast X-ray test) every year. Women who have a family history of breast cancer should talk to their health care provider about genetic screening. Women at a high risk of breast cancer should talk to their health care providers about having an MRI and a mammogram every year.  Breast cancer gene (BRCA)-related cancer risk assessment is recommended for women who have family members with BRCA-related cancers. BRCA-related cancers include breast, ovarian, tubal, and peritoneal cancers. Having family members with these cancers may be associated with an increased risk  for harmful changes (mutations) in the breast cancer genes BRCA1 and BRCA2. Results of the assessment will determine the need for genetic counseling and BRCA1 and BRCA2 testing.  Routine pelvic exams to screen for cancer are no longer recommended for nonpregnant women who are considered low risk for cancer of the pelvic organs (ovaries, uterus, and vagina) and who do not have symptoms. Ask your health care provider if a screening pelvic exam is right for you.  If you have had past treatment for cervical cancer or a condition that could lead to cancer, you need Pap tests and screening for cancer for at least 20 years after your treatment. If Pap tests have been discontinued, your risk factors (such as having a new sexual partner) need to be reassessed to determine if screening should be resumed. Some women have medical problems that increase the chance of getting cervical cancer. In these cases, your health care provider may recommend more frequent screening and Pap tests.  The HPV test is an additional test that may be used for cervical cancer screening.  The HPV test looks for the virus that can cause the cell changes on the cervix. The cells collected during the Pap test can be tested for HPV. The HPV test could be used to screen women aged 20 years and older, and should be used in women of any age who have unclear Pap test results. After the age of 63, women should have HPV testing at the same frequency as a Pap test.  Colorectal cancer can be detected and often prevented. Most routine colorectal cancer screening begins at the age of 80 years and continues through age 73 years. However, your health care provider may recommend screening at an earlier age if you have risk factors for colon cancer. On a yearly basis, your health care provider may provide home test kits to check for hidden blood in the stool. Use of a small camera at the end of a tube, to directly examine the colon (sigmoidoscopy or  colonoscopy), can detect the earliest forms of colorectal cancer. Talk to your health care provider about this at age 70, when routine screening begins. Direct exam of the colon should be repeated every 5-10 years through age 53 years, unless early forms of pre-cancerous polyps or small growths are found.  People who are at an increased risk for hepatitis B should be screened for this virus. You are considered at high risk for hepatitis B if:  You were born in a country where hepatitis B occurs often. Talk with your health care provider about which countries are considered high risk.  Your parents were born in a high-risk country and you have not received a shot to protect against hepatitis B (hepatitis B vaccine).  You have HIV or AIDS.  You use needles to inject street drugs.  You live with, or have sex with, someone who has hepatitis B.  You get hemodialysis treatment.  You take certain medicines for conditions like cancer, organ transplantation, and autoimmune conditions.  Hepatitis C blood testing is recommended for all people born from [birth year] through 1965 and any individual with known risks for hepatitis C.  Practice safe sex. Use condoms and avoid high-risk sexual practices to reduce the spread of sexually transmitted infections (STIs). STIs include gonorrhea, chlamydia, syphilis, trichomonas, herpes, HPV, and human immunodeficiency virus (HIV). Herpes, HIV, and HPV are viral illnesses that have no cure. They can result in disability, cancer, and death.  You should be screened for sexually transmitted illnesses (STIs) including gonorrhea and chlamydia if:  You are sexually active and are younger than 24 years.  You are older than 24 years and your health care provider tells you that you are at risk for this type of infection.  Your sexual activity has changed since you were last screened and you are at an increased risk for chlamydia or gonorrhea. Ask your health care provider if  you are at risk.  If you are at risk of being infected with HIV, it is recommended that you take a prescription medicine daily to prevent HIV infection. This is called preexposure prophylaxis (PrEP). You are considered at risk if:  You are a heterosexual woman, are sexually active, and are at increased risk for HIV infection.  You take drugs by injection.  You are sexually active with a partner who has HIV.  Talk with your health care provider about whether you are at high risk of being infected with HIV. If you choose to begin PrEP, you should first be tested for HIV. You should then be  tested every 3 months for as long as you are taking PrEP.  Osteoporosis is a disease in which the bones lose minerals and strength with aging. This can result in serious bone fractures or breaks. The risk of osteoporosis can be identified using a bone density scan. Women ages 23 years and over and women at risk for fractures or osteoporosis should discuss screening with their health care providers. Ask your health care provider whether you should take a calcium supplement or vitamin D to reduce the rate of osteoporosis.  Menopause can be associated with physical symptoms and risks. Hormone replacement therapy is available to decrease symptoms and risks. You should talk to your health care provider about whether hormone replacement therapy is right for you.  Use sunscreen. Apply sunscreen liberally and repeatedly throughout the day. You should seek shade when your shadow is shorter than you. Protect yourself by wearing long sleeves, pants, a wide-brimmed hat, and sunglasses year round, whenever you are outdoors.  Once a month, do a whole body skin exam, using a mirror to look at the skin on your back. Tell your health care provider of new moles, moles that have irregular borders, moles that are larger than a pencil eraser, or moles that have changed in shape or color.  Stay current with required vaccines  (immunizations).  Influenza vaccine. All adults should be immunized every year.  Tetanus, diphtheria, and acellular pertussis (Td, Tdap) vaccine. Pregnant women should receive 1 dose of Tdap vaccine during each pregnancy. The dose should be obtained regardless of the length of time since the last dose. Immunization is preferred during the 27th-36th week of gestation. An adult who has not previously received Tdap or who does not know her vaccine status should receive 1 dose of Tdap. This initial dose should be followed by tetanus and diphtheria toxoids (Td) booster doses every 10 years. Adults with an unknown or incomplete history of completing a 3-dose immunization series with Td-containing vaccines should begin or complete a primary immunization series including a Tdap dose. Adults should receive a Td booster every 10 years.  Varicella vaccine. An adult without evidence of immunity to varicella should receive 2 doses or a second dose if she has previously received 1 dose. Pregnant females who do not have evidence of immunity should receive the first dose after pregnancy. This first dose should be obtained before leaving the health care facility. The second dose should be obtained 4-8 weeks after the first dose.  Human papillomavirus (HPV) vaccine. Females aged 13-26 years who have not received the vaccine previously should obtain the 3-dose series. The vaccine is not recommended for use in pregnant females. However, pregnancy testing is not needed before receiving a dose. If a female is found to be pregnant after receiving a dose, no treatment is needed. In that case, the remaining doses should be delayed until after the pregnancy. Immunization is recommended for any person with an immunocompromised condition through the age of 29 years if she did not get any or all doses earlier. During the 3-dose series, the second dose should be obtained 4-8 weeks after the first dose. The third dose should be obtained  24 weeks after the first dose and 16 weeks after the second dose.  Zoster vaccine. One dose is recommended for adults aged 30 years or older unless certain conditions are present.  Measles, mumps, and rubella (MMR) vaccine. Adults born before [birth year] generally are considered immune to measles and mumps. Adults born in [birth year] or later  should have 1 or more doses of MMR vaccine unless there is a contraindication to the vaccine or there is laboratory evidence of immunity to each of the three diseases. A routine second dose of MMR vaccine should be obtained at least 28 days after the first dose for students attending postsecondary schools, health care workers, or international travelers. People who received inactivated measles vaccine or an unknown type of measles vaccine during 1963-1967 should receive 2 doses of MMR vaccine. People who received inactivated mumps vaccine or an unknown type of mumps vaccine before 1979 and are at high risk for mumps infection should consider immunization with 2 doses of MMR vaccine. For females of childbearing age, rubella immunity should be determined. If there is no evidence of immunity, females who are not pregnant should be vaccinated. If there is no evidence of immunity, females who are pregnant should delay immunization until after pregnancy. Unvaccinated health care workers born before [birth year] who lack laboratory evidence of measles, mumps, or rubella immunity or laboratory confirmation of disease should consider measles and mumps immunization with 2 doses of MMR vaccine or rubella immunization with 1 dose of MMR vaccine.  Pneumococcal 13-valent conjugate (PCV13) vaccine. When indicated, a person who is uncertain of her immunization history and has no record of immunization should receive the PCV13 vaccine. An adult aged 2 years or older who has certain medical conditions and has not been previously immunized should receive 1 dose of PCV13 vaccine. This PCV13 should be followed  with a dose of pneumococcal polysaccharide (PPSV23) vaccine. The PPSV23 vaccine dose should be obtained at least 8 weeks after the dose of PCV13 vaccine. An adult aged 38 years or older who has certain medical conditions and previously received 1 or more doses of PPSV23 vaccine should receive 1 dose of PCV13. The PCV13 vaccine dose should be obtained 1 or more years after the last PPSV23 vaccine dose.  Pneumococcal polysaccharide (PPSV23) vaccine. When PCV13 is also indicated, PCV13 should be obtained first. All adults aged 20 years and older should be immunized. An adult younger than age 62 years who has certain medical conditions should be immunized. Any person who resides in a nursing home or long-term care facility should be immunized. An adult smoker should be immunized. People with an immunocompromised condition and certain other conditions should receive both PCV13 and PPSV23 vaccines. People with human immunodeficiency virus (HIV) infection should be immunized as soon as possible after diagnosis. Immunization during chemotherapy or radiation therapy should be avoided. Routine use of PPSV23 vaccine is not recommended for American Indians, [place] Natives, or people younger than 65 years unless there are medical conditions that require PPSV23 vaccine. When indicated, people who have unknown immunization and have no record of immunization should receive PPSV23 vaccine. One-time revaccination 5 years after the first dose of PPSV23 is recommended for people aged 19-64 years who have chronic kidney failure, nephrotic syndrome, asplenia, or immunocompromised conditions. People who received 1-2 doses of PPSV23 before age 67 years should receive another dose of PPSV23 vaccine at age 30 years or later if at least 5 years have passed since the previous dose. Doses of PPSV23 are not needed for people immunized with PPSV23 at or after age 36 years.  Meningococcal vaccine. Adults with asplenia or persistent complement  component deficiencies should receive 2 doses of quadrivalent meningococcal conjugate (MenACWY-D) vaccine. The doses should be obtained at least 2 months apart. Microbiologists working with certain meningococcal bacteria, [employer] recruits, people at risk during an outbreak,  and people who travel to or live in countries with a high rate of meningitis should be immunized. A first-year college student up through age 58 years who is living in a residence hall should receive a dose if she did not receive a dose on or after her 16th birthday. Adults who have certain high-risk conditions should receive one or more doses of vaccine.  Hepatitis A vaccine. Adults who wish to be protected from this disease, have certain high-risk conditions, work with hepatitis A-infected animals, work in hepatitis A research labs, or travel to or work in countries with a high rate of hepatitis A should be immunized. Adults who were previously unvaccinated and who anticipate close contact with an international adoptee during the first 60 days after arrival in the [place] from a country with a high rate of hepatitis A should be immunized.  Hepatitis B vaccine. Adults who wish to be protected from this disease, have certain high-risk conditions, may be exposed to blood or other infectious body fluids, are household contacts or sex partners of hepatitis B positive people, are clients or workers in certain care facilities, or travel to or work in countries with a high rate of hepatitis B should be immunized.  Haemophilus influenzae type b (Hib) vaccine. A previously unvaccinated person with asplenia or sickle cell disease or having a scheduled splenectomy should receive 1 dose of Hib vaccine. Regardless of previous immunization, a recipient of a hematopoietic stem cell transplant should receive a 3-dose series 6-12 months after her successful transplant. Hib vaccine is not recommended for adults with HIV infection. Preventive  Services / Frequency Ages 26 to 44 years  Blood pressure check.** / Every 1 to 2 years.  Lipid and cholesterol check.** / Every 5 years beginning at age 2.  Clinical breast exam.** / Every 3 years for women in their 38s and 72s.  BRCA-related cancer risk assessment.** / For women who have family members with a BRCA-related cancer (breast, ovarian, tubal, or peritoneal cancers).  Pap test.** / Every 2 years from ages 80 through 36. Every 3 years starting at age 56 through age 21 or 100 with a history of 3 consecutive normal Pap tests.  HPV screening.** / Every 3 years from ages 25 through ages 60 to 63 with a history of 3 consecutive normal Pap tests.  Hepatitis C blood test.** / For any individual with known risks for hepatitis C.  Skin self-exam. / Monthly.  Influenza vaccine. / Every year.  Tetanus, diphtheria, and acellular pertussis (Tdap, Td) vaccine.** / Consult your health care provider. Pregnant women should receive 1 dose of Tdap vaccine during each pregnancy. 1 dose of Td every 10 years.  Varicella vaccine.** / Consult your health care provider. Pregnant females who do not have evidence of immunity should receive the first dose after pregnancy.  HPV vaccine. / 3 doses over 6 months, if 21 and younger. The vaccine is not recommended for use in pregnant females. However, pregnancy testing is not needed before receiving a dose.  Measles, mumps, rubella (MMR) vaccine.** / You need at least 1 dose of MMR if you were born in 1957 or later. You may also need a 2nd dose. For females of childbearing age, rubella immunity should be determined. If there is no evidence of immunity, females who are not pregnant should be vaccinated. If there is no evidence of immunity, females who are pregnant should delay immunization until after pregnancy.  Pneumococcal 13-valent conjugate (PCV13) vaccine.** / Consult your health  care provider.  Pneumococcal polysaccharide (PPSV23) vaccine.** / 1 to 2  doses if you smoke cigarettes or if you have certain conditions.  Meningococcal vaccine.** / 1 dose if you are age 40 to 39 years and a Market researcher living in a residence hall, or have one of several medical conditions, you need to get vaccinated against meningococcal disease. You may also need additional booster doses.  Hepatitis A vaccine.** / Consult your health care provider.  Hepatitis B vaccine.** / Consult your health care provider.  Haemophilus influenzae type b (Hib) vaccine.** / Consult your health care provider. Ages 51 to 77 years  Blood pressure check.** / Every 1 to 2 years.  Lipid and cholesterol check.** / Every 5 years beginning at age 31 years.  Lung cancer screening. / Every year if you are aged 50-80 years and have a 30-pack-year history of smoking and currently smoke or have quit within the past 15 years. Yearly screening is stopped once you have quit smoking for at least 15 years or develop a health problem that would prevent you from having lung cancer treatment.  Clinical breast exam.** / Every year after age 5 years.  BRCA-related cancer risk assessment.** / For women who have family members with a BRCA-related cancer (breast, ovarian, tubal, or peritoneal cancers).  Mammogram.** / Every year beginning at age 84 years and continuing for as long as you are in good health. Consult with your health care provider.  Pap test.** / Every 3 years starting at age 13 years through age 49 or 54 years with a history of 3 consecutive normal Pap tests.  HPV screening.** / Every 3 years from ages 8 years through ages 73 to 104 years with a history of 3 consecutive normal Pap tests.  Fecal occult blood test (FOBT) of stool. / Every year beginning at age 36 years and continuing until age 57 years. You may not need to do this test if you get a colonoscopy every 10 years.  Flexible sigmoidoscopy or colonoscopy.** / Every 5 years for a flexible sigmoidoscopy or every  10 years for a colonoscopy beginning at age 19 years and continuing until age 56 years.  Hepatitis C blood test.** / For all people born from [birth year] through 1965 and any individual with known risks for hepatitis C.  Skin self-exam. / Monthly.  Influenza vaccine. / Every year.  Tetanus, diphtheria, and acellular pertussis (Tdap/Td) vaccine.** / Consult your health care provider. Pregnant women should receive 1 dose of Tdap vaccine during each pregnancy. 1 dose of Td every 10 years.  Varicella vaccine.** / Consult your health care provider. Pregnant females who do not have evidence of immunity should receive the first dose after pregnancy.  Zoster vaccine.** / 1 dose for adults aged 63 years or older.  Measles, mumps, rubella (MMR) vaccine.** / You need at least 1 dose of MMR if you were born in 1957 or later. You may also need a 2nd dose. For females of childbearing age, rubella immunity should be determined. If there is no evidence of immunity, females who are not pregnant should be vaccinated. If there is no evidence of immunity, females who are pregnant should delay immunization until after pregnancy.  Pneumococcal 13-valent conjugate (PCV13) vaccine.** / Consult your health care provider.  Pneumococcal polysaccharide (PPSV23) vaccine.** / 1 to 2 doses if you smoke cigarettes or if you have certain conditions.  Meningococcal vaccine.** / Consult your health care provider.  Hepatitis A vaccine.** / Consult your health care  provider.  Hepatitis B vaccine.** / Consult your health care provider.  Haemophilus influenzae type b (Hib) vaccine.** / Consult your health care provider. Ages 31 years and over  Blood pressure check.** / Every 1 to 2 years.  Lipid and cholesterol check.** / Every 5 years beginning at age 29 years.  Lung cancer screening. / Every year if you are aged 45-80 years and have a 30-pack-year history of smoking and currently smoke or have quit within the past 15 years.  Yearly screening is stopped once you have quit smoking for at least 15 years or develop a health problem that would prevent you from having lung cancer treatment.  Clinical breast exam.** / Every year after age 56 years.  BRCA-related cancer risk assessment.** / For women who have family members with a BRCA-related cancer (breast, ovarian, tubal, or peritoneal cancers).  Mammogram.** / Every year beginning at age 7 years and continuing for as long as you are in good health. Consult with your health care provider.  Pap test.** / Every 3 years starting at age 51 years through age 50 or 53 years with 3 consecutive normal Pap tests. Testing can be stopped between 65 and 70 years with 3 consecutive normal Pap tests and no abnormal Pap or HPV tests in the past 10 years.  HPV screening.** / Every 3 years from ages 53 years through ages 45 or 63 years with a history of 3 consecutive normal Pap tests. Testing can be stopped between 65 and 70 years with 3 consecutive normal Pap tests and no abnormal Pap or HPV tests in the past 10 years.  Fecal occult blood test (FOBT) of stool. / Every year beginning at age 41 years and continuing until age 26 years. You may not need to do this test if you get a colonoscopy every 10 years.  Flexible sigmoidoscopy or colonoscopy.** / Every 5 years for a flexible sigmoidoscopy or every 10 years for a colonoscopy beginning at age 7 years and continuing until age 59 years.  Hepatitis C blood test.** / For all people born from [birth year] through 1965 and any individual with known risks for hepatitis C.  Osteoporosis screening.** / A one-time screening for women ages 44 years and over and women at risk for fractures or osteoporosis.  Skin self-exam. / Monthly.  Influenza vaccine. / Every year.  Tetanus, diphtheria, and acellular pertussis (Tdap/Td) vaccine.** / 1 dose of Td every 10 years.  Varicella vaccine.** / Consult your health care provider.  Zoster vaccine.** / 1  dose for adults aged 52 years or older.  Pneumococcal 13-valent conjugate (PCV13) vaccine.** / Consult your health care provider.  Pneumococcal polysaccharide (PPSV23) vaccine.** / 1 dose for all adults aged 22 years and older.  Meningococcal vaccine.** / Consult your health care provider.  Hepatitis A vaccine.** / Consult your health care provider.  Hepatitis B vaccine.** / Consult your health care provider.  Haemophilus influenzae type b (Hib) vaccine.** / Consult your health care provider. ** Family history and personal history of risk and conditions may change your health care provider's recommendations. Document Released: 01/20/2002 Document Revised: 04/10/2014 Document Reviewed: 04/21/2011 [facility] Patient Information 2015 [place], [place]. This information is not intended to replace advice given to you by your health care provider. Make sure you discuss any questions you have with your health care provider.

## 2015-04-30 DIAGNOSIS — Z1231 Encounter for screening mammogram for malignant neoplasm of breast: Secondary | ICD-10-CM | POA: Diagnosis not present

## 2015-05-08 ENCOUNTER — Encounter: Payer: Self-pay | Admitting: Family Medicine

## 2015-08-26 ENCOUNTER — Other Ambulatory Visit: Payer: Self-pay | Admitting: Family Medicine

## 2015-10-05 ENCOUNTER — Other Ambulatory Visit: Payer: Self-pay | Admitting: Family Medicine

## 2015-10-26 ENCOUNTER — Encounter: Payer: Self-pay | Admitting: Internal Medicine

## 2015-11-16 ENCOUNTER — Other Ambulatory Visit: Payer: Self-pay | Admitting: Family Medicine

## 2015-11-26 ENCOUNTER — Telehealth: Payer: Self-pay | Admitting: Family Medicine

## 2015-11-26 DIAGNOSIS — Z0100 Encounter for examination of eyes and vision without abnormal findings: Secondary | ICD-10-CM

## 2015-11-26 NOTE — Telephone Encounter (Signed)
Ref has been placed.     KP 

## 2015-11-26 NOTE — Telephone Encounter (Signed)
Pt requesting referral to Dr. Schuyler Amor in [place] [phone] opthamology. Has appt 11/29/15 for routine yearly eye exam.

## 2015-11-26 NOTE — Telephone Encounter (Signed)
Pt called back. Appt changed to 11/27/15 2:30pm with Dr. Midge Aver, same office as below.

## 2015-11-27 DIAGNOSIS — H10413 Chronic giant papillary conjunctivitis, bilateral: Secondary | ICD-10-CM | POA: Diagnosis not present

## 2015-11-27 DIAGNOSIS — H04223 Epiphora due to insufficient drainage, bilateral lacrimal glands: Secondary | ICD-10-CM | POA: Diagnosis not present

## 2015-11-27 DIAGNOSIS — Z961 Presence of intraocular lens: Secondary | ICD-10-CM | POA: Diagnosis not present

## 2015-11-27 DIAGNOSIS — H353132 Nonexudative age-related macular degeneration, bilateral, intermediate dry stage: Secondary | ICD-10-CM | POA: Diagnosis not present

## 2015-12-17 ENCOUNTER — Other Ambulatory Visit: Payer: Self-pay | Admitting: Family Medicine

## 2015-12-17 NOTE — Telephone Encounter (Signed)
Please schedule the patient a  Follow up fasting.      KP

## 2015-12-21 NOTE — Telephone Encounter (Signed)
Patient scheduled for Follow Up 01/14/2016 @ 8:15

## 2016-01-13 ENCOUNTER — Encounter: Payer: Self-pay | Admitting: Family Medicine

## 2016-01-14 ENCOUNTER — Ambulatory Visit: Payer: Commercial Managed Care - HMO | Admitting: Family Medicine

## 2016-01-24 ENCOUNTER — Other Ambulatory Visit: Payer: Self-pay

## 2016-01-24 MED ORDER — ROSUVASTATIN CALCIUM 20 MG PO TABS: 20.0000 mg | ORAL_TABLET | Freq: Every day | ORAL | Status: DC

## 2016-02-04 ENCOUNTER — Other Ambulatory Visit: Payer: Self-pay

## 2016-02-04 DIAGNOSIS — I10 Essential (primary) hypertension: Secondary | ICD-10-CM

## 2016-02-04 MED ORDER — LISINOPRIL 20 MG PO TABS: 20.0000 mg | ORAL_TABLET | Freq: Every day | ORAL | Status: DC

## 2016-02-04 NOTE — Telephone Encounter (Signed)
CPE on 02/07/16. Rx faxed.    KP

## 2016-02-07 ENCOUNTER — Encounter: Payer: Self-pay | Admitting: Family Medicine

## 2016-02-07 ENCOUNTER — Ambulatory Visit (INDEPENDENT_AMBULATORY_CARE_PROVIDER_SITE_OTHER): Payer: Commercial Managed Care - HMO | Admitting: Family Medicine

## 2016-02-07 VITALS — BP 116/64 | HR 71 | Temp 97.9°F | Ht 60.0 in | Wt 166.2 lb

## 2016-02-07 DIAGNOSIS — R197 Diarrhea, unspecified: Secondary | ICD-10-CM | POA: Diagnosis not present

## 2016-02-07 DIAGNOSIS — E785 Hyperlipidemia, unspecified: Secondary | ICD-10-CM | POA: Diagnosis not present

## 2016-02-07 DIAGNOSIS — I10 Essential (primary) hypertension: Secondary | ICD-10-CM

## 2016-02-07 LAB — LIPID PANEL
CHOLESTEROL: 132 mg/dL (ref 0–200)
HDL: 41 mg/dL (ref >39.00)
LDL Cholesterol: 63 mg/dL (ref 0–99)
NonHDL: 90.69
Total CHOL/HDL Ratio: 3
Triglycerides: 136 mg/dL (ref 0.0–149.0)
VLDL: 27.2 mg/dL (ref 0.0–40.0)

## 2016-02-07 LAB — COMPREHENSIVE METABOLIC PANEL
ALBUMIN: 4.1 g/dL (ref 3.5–5.2)
ALT: 37 U/L — AB (ref 0–35)
AST: 31 U/L (ref 0–37)
Alkaline Phosphatase: 66 U/L (ref 39–117)
BILIRUBIN TOTAL: 0.5 mg/dL (ref 0.2–1.2)
BUN: 15 mg/dL (ref 6–23)
CALCIUM: 9.7 mg/dL (ref 8.4–10.5)
CHLORIDE: 102 meq/L (ref 96–112)
CO2: 33 mEq/L — ABNORMAL HIGH (ref 19–32)
CREATININE: 0.69 mg/dL (ref 0.40–1.20)
GFR: 88.27 mL/min (ref >60.00)
Glucose, Bld: 100 mg/dL — ABNORMAL HIGH (ref 70–99)
Potassium: 3.7 mEq/L (ref 3.5–5.1)
Sodium: 140 mEq/L (ref 135–145)
Total Protein: 7.6 g/dL (ref 6.0–8.3)

## 2016-02-07 NOTE — Progress Notes (Signed)
Pre visit review using our clinic review tool, if applicable. No additional management support is needed unless otherwise documented below in the visit note. 

## 2016-02-07 NOTE — Patient Instructions (Signed)

## 2016-02-07 NOTE — Progress Notes (Signed)
Patient ID: Cindy Velasquez, female    DOB: July 29, 1941  Age: 75 y.o. MRN: [id number]    Subjective:  Subjective HPI Cindy Velasquez presents for f/u bp and cholesterol.  She also c/o diarrhea and nausea x few months.  No recent abx, no fever, no vomiting. Review of Systems  Constitutional: Negative for diaphoresis, appetite change, fatigue and unexpected weight change.  Eyes: Negative for pain, redness and visual disturbance.  Respiratory: Negative for cough, chest tightness, shortness of breath and wheezing.   Cardiovascular: Negative for chest pain, palpitations and leg swelling.  Gastrointestinal: Positive for nausea and diarrhea. Negative for vomiting, abdominal pain, constipation, blood in stool, abdominal distention, anal bleeding and rectal pain.  Endocrine: Negative for cold intolerance, heat intolerance, polydipsia, polyphagia and polyuria.  Genitourinary: Negative for dysuria, frequency and difficulty urinating.  Neurological: Negative for dizziness, light-headedness, numbness and headaches.    History Past Medical History  Diagnosis Date  . Hypertension   . Hyperlipidemia   . Osteoporosis   . GERD (gastroesophageal reflux disease)   . Adenomatous colon polyp   . Hyperplastic colon polyp   . Diverticulosis of colon   . Cancer ([place]) 03/2015    [place] R tibia     She has past surgical history that includes Tubal ligation; Hammer toe surgery; Rotator cuff repair; and Eye surgery (2012).   Her family history includes Cancer (age of onset: 13) in her brother; Cancer (age of onset: 31) in her mother; Heart disease in her brother; Hypertension in her brother, brother, father, and mother; Stroke in her father.She reports that she quit smoking about 24 years ago. Her smoking use included Cigarettes. She has a 15 pack-year smoking history. She has never used smokeless tobacco. She reports that she drinks about 3.0 oz of alcohol per week. She reports that she does not use illicit  drugs.  Current Outpatient Prescriptions on File Prior to Visit  Medication Sig Dispense Refill  . ammonium lactate (LAC-HYDRIN) 12 % cream Apply to skin bid 385 g 0   No current facility-administered medications on file prior to visit.     Objective:  Objective Physical Exam  Constitutional: She is oriented to person, place, and time. She appears well-developed and well-nourished.  HENT:  Head: Normocephalic and atraumatic.  Eyes: Conjunctivae and EOM are normal.  Neck: Normal range of motion. Neck supple. No JVD present. Carotid bruit is not present. No thyromegaly present.  Cardiovascular: Normal rate, regular rhythm and normal heart sounds.   No murmur heard. Pulmonary/Chest: Effort normal and breath sounds normal. No respiratory distress. She has no wheezes. She has no rales. She exhibits no tenderness.  Musculoskeletal: She exhibits no edema.  Neurological: She is alert and oriented to person, place, and time.  Psychiatric: She has a normal mood and affect.  Nursing note and vitals reviewed.  BP 116/64 mmHg  Pulse 71  Temp(Src) 97.9 F (36.6 C) (Oral)  Ht 5' (1.524 m)  Wt 166 lb 3.2 oz (75.388 kg)  BMI 32.46 kg/m2  SpO2 98% Wt Readings from Last 3 Encounters:  02/07/16 166 lb 3.2 oz (75.388 kg)  04/23/15 163 lb 3.2 oz (74.027 kg)  02/13/15 166 lb 3.2 oz (75.388 kg)     Lab Results  Component Value Date   WBC 6.7 10/13/2014   HGB 14.4 10/13/2014   HCT 44.4 10/13/2014   PLT 262 10/13/2014   GLUCOSE 100* 02/07/2016   CHOL 132 02/07/2016   TRIG 136.0 02/07/2016   HDL 41.00  02/07/2016   LDLDIRECT 129.1 08/23/2007   LDLCALC 63 02/07/2016   ALT 37* 02/07/2016   AST 31 02/07/2016   NA 140 02/07/2016   K 3.7 02/07/2016   CL 102 02/07/2016   CREATININE 0.69 02/07/2016   BUN 15 02/07/2016   CO2 33* 02/07/2016   TSH 1.813 10/13/2014    No results found.   Assessment & Plan:  Plan I am having Cindy Velasquez maintain her ammonium lactate, ICAPS AREDS 2, Probiotic  Product (PROBIOTIC & ACIDOPHILUS EX ST PO), rosuvastatin, lisinopril, and hydrochlorothiazide.  Meds ordered this encounter  Medications  . Multiple Vitamins-Minerals (ICAPS AREDS 2) CAPS    Sig: Take 1 capsule by mouth daily.  . Probiotic Product (PROBIOTIC & ACIDOPHILUS EX ST PO)    Sig: Take by mouth.  . rosuvastatin (CRESTOR) 20 MG tablet    Sig: Take 1 tablet (20 mg total) by mouth daily. REPEAT LABS ARE DUE NOW    Dispense:  30 tablet    Refill:  0  . lisinopril (PRINIVIL,ZESTRIL) 20 MG tablet    Sig: Take 1 tablet (20 mg total) by mouth daily.    Dispense:  90 tablet    Refill:  3  . hydrochlorothiazide (HYDRODIURIL) 25 MG tablet    Sig: 1/2 tab po qam    Dispense:  45 tablet    Refill:  3    Problem List Items Addressed This Visit    Hyperlipidemia - Primary   Relevant Medications   rosuvastatin (CRESTOR) 20 MG tablet   lisinopril (PRINIVIL,ZESTRIL) 20 MG tablet   hydrochlorothiazide (HYDRODIURIL) 25 MG tablet   Other Relevant Orders   Comp Met (CMET) (Completed)   Lipid panel (Completed)   Essential hypertension   Relevant Medications   rosuvastatin (CRESTOR) 20 MG tablet   lisinopril (PRINIVIL,ZESTRIL) 20 MG tablet   hydrochlorothiazide (HYDRODIURIL) 25 MG tablet   Other Relevant Orders   Comp Met (CMET) (Completed)   Lipid panel (Completed)    Other Visit Diagnoses    Diarrhea, unspecified type        Relevant Orders    Clostridium difficile EIA    Stool culture       Follow-up: Return in about 6 months (around 08/09/2016), or if symptoms worsen or fail to improve, for hyperlipidemia, hypertension.  Garnet Koyanagi, DO

## 2016-02-08 ENCOUNTER — Other Ambulatory Visit: Payer: Self-pay | Admitting: Family Medicine

## 2016-02-08 DIAGNOSIS — R197 Diarrhea, unspecified: Secondary | ICD-10-CM | POA: Diagnosis not present

## 2016-02-10 ENCOUNTER — Encounter: Payer: Self-pay | Admitting: Family Medicine

## 2016-02-10 MED ORDER — HYDROCHLOROTHIAZIDE 25 MG PO TABS: ORAL_TABLET | ORAL | Status: DC

## 2016-02-10 MED ORDER — LISINOPRIL 20 MG PO TABS: 20.0000 mg | ORAL_TABLET | Freq: Every day | ORAL | Status: DC

## 2016-02-10 MED ORDER — ROSUVASTATIN CALCIUM 20 MG PO TABS: 20.0000 mg | ORAL_TABLET | Freq: Every day | ORAL | Status: DC

## 2016-02-11 NOTE — Progress Notes (Signed)
Quick Note:  Copy mailed. KP ______

## 2016-02-12 LAB — C. DIFFICILE GDH AND TOXIN A/B

## 2016-02-12 LAB — STOOL CULTURE: ORGANISM ID, BACTERIA: NO GROWTH

## 2016-02-13 ENCOUNTER — Telehealth: Payer: Self-pay | Admitting: *Deleted

## 2016-02-13 NOTE — Telephone Encounter (Signed)
Pt states that she was feeling better and  was only able to bring in one vial of the required 3 vial stool kit.Marland Kitchen Spoke to [facility] if only one of the vials could be sent for the required testing, representative states that yes they will be able to run test on the one vial... Received a call today 3/8 by [facility] lab tech that the stool could not be resulted due to not being in the right color stool vial... I explained to the tech what the representative stated , tech apologized and will notify or update the representative on its policies so this doesn't happen again. Marland Kitchen

## 2016-02-15 ENCOUNTER — Other Ambulatory Visit: Payer: Self-pay | Admitting: Emergency Medicine

## 2016-02-15 ENCOUNTER — Other Ambulatory Visit (INDEPENDENT_AMBULATORY_CARE_PROVIDER_SITE_OTHER): Payer: Commercial Managed Care - HMO

## 2016-02-15 DIAGNOSIS — D649 Anemia, unspecified: Secondary | ICD-10-CM

## 2016-02-15 LAB — FECAL OCCULT BLOOD, IMMUNOCHEMICAL: Fecal Occult Bld: NEGATIVE

## 2016-02-22 ENCOUNTER — Other Ambulatory Visit: Payer: Self-pay | Admitting: Family Medicine

## 2016-02-25 ENCOUNTER — Ambulatory Visit (HOSPITAL_BASED_OUTPATIENT_CLINIC_OR_DEPARTMENT_OTHER)
Admission: RE | Admit: 2016-02-25 | Discharge: 2016-02-25 | Disposition: A | Discharge status: Home / Self Care | Payer: Commercial Managed Care - HMO | Source: Ambulatory Visit | Attending: Family | Admitting: Family

## 2016-02-25 ENCOUNTER — Ambulatory Visit (INDEPENDENT_AMBULATORY_CARE_PROVIDER_SITE_OTHER): Payer: Commercial Managed Care - HMO | Admitting: Family

## 2016-02-25 ENCOUNTER — Encounter: Payer: Self-pay | Admitting: Family

## 2016-02-25 VITALS — BP 126/70 | HR 78 | Temp 98.0°F | Resp 16 | Ht 60.0 in | Wt 163.2 lb

## 2016-02-25 DIAGNOSIS — R509 Fever, unspecified: Secondary | ICD-10-CM | POA: Diagnosis not present

## 2016-02-25 DIAGNOSIS — R059 Cough, unspecified: Secondary | ICD-10-CM

## 2016-02-25 DIAGNOSIS — R05 Cough: Secondary | ICD-10-CM | POA: Insufficient documentation

## 2016-02-25 MED ORDER — BENZONATATE 100 MG PO CAPS: 100.0000 mg | ORAL_CAPSULE | Freq: Three times a day (TID) | ORAL | Status: DC | PRN

## 2016-02-25 NOTE — Patient Instructions (Signed)
Please complete chest x ray on the first floor. You may use tessalon as needed for cough. Please call if symptoms worsen, if you develop fever >101, if new symptoms or if you are not improved in 3 days.

## 2016-02-25 NOTE — Progress Notes (Signed)
Pre visit review using our clinic review tool, if applicable. No additional management support is needed unless otherwise documented below in the visit note. 

## 2016-02-25 NOTE — Progress Notes (Signed)
Subjective:    Patient ID: Cindy Velasquez, female    DOB: December 26, 1940, 75 y.o.   MRN: [id number]  HPI  Cindy Velasquez is a 74 yr old female who presents today with c/o of 1 week hx of chills and low grade temp. Reports temp at home as high as 100.  She tried coricidin HBP without improvement.  Denies sick contacts. + clear nasal drainage  Review of Systems See HPI  Past Medical History  Diagnosis Date  . Hypertension   . Hyperlipidemia   . Osteoporosis   . GERD (gastroesophageal reflux disease)   . Adenomatous colon polyp   . Hyperplastic colon polyp   . Diverticulosis of colon   . Cancer ([place]) 03/2015    [place] R tibia     Social History   Social History  . Marital Status: Married    Spouse Name: N/A  . Number of Children: N/A  . Years of Education: N/A   Occupational History  . 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 Smoking status: Former Smoker -- 0.50 packs/day for 30 years    Types: Cigarettes    Quit date: 06/02/1991  . Smokeless tobacco: Never Used  . Alcohol Use: 3.0 oz/week    5 Glasses of wine per week  . Drug Use: No  . Sexual Activity:    Partners: Male   Other Topics Concern  . Not on file   Social History Narrative   Exercise--  [employer]--yoga and cardio 3x a week    Past Surgical History  Procedure Laterality Date  . Tubal ligation    . Hammer toe surgery      Bilateral  . Rotator cuff repair      Left  . Eye surgery  2012    B/L 01/2011  02/2011    Family History  Problem Relation Age of Onset  . Hypertension Mother   . Cancer Mother 106    ?  Marland Kitchen Hypertension Father   . Stroke Father   . Hypertension Brother   . Cancer Brother 35    testicular   . Hypertension Brother   . Heart disease Brother     quadruple bypass    No Known Allergies  Current Outpatient Prescriptions on File Prior to Visit  Medication Sig Dispense Refill  . ammonium lactate (LAC-HYDRIN) 12 % cream Apply to skin bid 385 g 0  .  hydrochlorothiazide (HYDRODIURIL) 25 MG tablet 1/2 tab po qam 45 tablet 3  . lisinopril (PRINIVIL,ZESTRIL) 20 MG tablet Take 1 tablet (20 mg total) by mouth daily. 90 tablet 3  . Multiple Vitamins-Minerals (ICAPS AREDS 2) CAPS Take 1 capsule by mouth daily.    . Probiotic Product (PROBIOTIC & ACIDOPHILUS EX ST PO) Take by mouth.    . rosuvastatin (CRESTOR) 20 MG tablet Take 1 tablet (20 mg total) by mouth daily. REPEAT LABS ARE DUE NOW 30 tablet 0   No current facility-administered medications on file prior to visit.    BP 126/70 mmHg  Pulse 78  Temp(Src) 98 F (36.7 C) (Oral)  Resp 16  Ht 5' (1.524 m)  Wt 163 lb 3.2 oz (74.027 kg)  BMI 31.87 kg/m2  SpO2 94%       Objective:   Physical Exam  Constitutional: She is oriented to person, place, and time. She appears well-developed and well-nourished.  HENT:  Head: Normocephalic and atraumatic.  Right Ear: External ear normal.  Left Ear: External ear normal.  Mouth/Throat: Oropharynx is clear and moist. No oropharyngeal exudate.  Bilateral cerumen impaction noted  Eyes: No scleral icterus.  Cardiovascular: Normal rate and regular rhythm.   No murmur heard. Pulmonary/Chest: Effort normal and breath sounds normal. No respiratory distress. She has no wheezes. She has no rales. She exhibits no tenderness.  Musculoskeletal: She exhibits no edema.  Lymphadenopathy:    She has no cervical adenopathy.  Neurological: She is alert and oriented to person, place, and time.  Skin: Skin is warm and dry.  Psychiatric: She has a normal mood and affect. Her behavior is normal. Judgment and thought content normal.          Assessment & Plan:  Viral URI with cough-  CXR negative for pna.  Will rx with tessalon. She is instructed to call if symptoms worsen, if you develop fever >101, if new symptoms or if you are not improved in 3 days.

## 2016-02-26 ENCOUNTER — Telehealth: Payer: Self-pay | Admitting: *Deleted

## 2016-02-26 NOTE — Telephone Encounter (Signed)
Spoke with pt, she states her cough is a little better but she continues to have a lot of congestion and is wanting to know what she can do / take for it?  Please advise?

## 2016-02-27 NOTE — Telephone Encounter (Signed)
Notified pt and she voices understanding. 

## 2016-02-27 NOTE — Telephone Encounter (Signed)
I would recommend mucinex 600mg  twice daily as needed. Call if symptoms do not continue to improve.

## 2016-03-02 ENCOUNTER — Other Ambulatory Visit: Payer: Self-pay | Admitting: Family Medicine

## 2016-03-06 ENCOUNTER — Encounter: Payer: Self-pay | Admitting: Family Medicine

## 2016-03-06 DIAGNOSIS — Z1283 Encounter for screening for malignant neoplasm of skin: Secondary | ICD-10-CM

## 2016-03-22 ENCOUNTER — Ambulatory Visit: Payer: Commercial Managed Care - HMO | Admitting: Family Medicine

## 2016-03-22 ENCOUNTER — Encounter: Payer: Self-pay | Admitting: Family Medicine

## 2016-03-22 ENCOUNTER — Ambulatory Visit (INDEPENDENT_AMBULATORY_CARE_PROVIDER_SITE_OTHER): Payer: Commercial Managed Care - HMO | Admitting: Family Medicine

## 2016-03-22 VITALS — BP 110/70 | HR 87 | Temp 98.0°F | Resp 20 | Ht 60.0 in | Wt 160.8 lb

## 2016-03-22 DIAGNOSIS — T1592XA Foreign body on external eye, part unspecified, left eye, initial encounter: Secondary | ICD-10-CM

## 2016-03-22 NOTE — Progress Notes (Signed)
   Subjective:    Patient ID: Cindy Velasquez, female    DOB: January 01, 1941, 75 y.o.   MRN: [id number]  HPI Here for a sensation of something in the left eye since yesterday. No recent trauma. No eye pain. Vision is normal. She does not wear contacts.    Review of Systems  Constitutional: Negative.   HENT: Negative.   Eyes: Negative for photophobia, pain, discharge, redness, itching and visual disturbance.       Objective:   Physical Exam  Constitutional: She appears well-developed and well-nourished. No distress.  HENT:  Head: Normocephalic and atraumatic.  Eyes: Conjunctivae and EOM are normal. Pupils are equal, round, and reactive to light.  On retracting the left lower eyelid a tiny yellowish particle, possibly plant material, was seen          Assessment & Plan:  Foreign body in the eye. This was removed using a sterile cotton swab, then the eye was copiously irrigated with saline. She immediately felt relief. Recheck prn

## 2016-03-25 ENCOUNTER — Telehealth: Payer: Self-pay | Admitting: Behavioral Health

## 2016-03-25 NOTE — Telephone Encounter (Addendum)
[facility] note received via fax  Call Date: 03/22/16 Time: 9:03 AM  Caller: Rozann Lesches (Self)  Return number: [phone]  Nurse: Chalmers Guest, RN  Chief Complaint: Eye pain  Reason for call: Caller states having eye pain; vision normal; flushing it, but nothing coming out.  Related visit to physician within the last 2 weeks: No  Guideline: Eye Pain  Disposition: Go to the ED now  Patient was seen in office on 03/22/16.

## 2016-03-29 ENCOUNTER — Other Ambulatory Visit: Payer: Self-pay | Admitting: Family Medicine

## 2016-04-14 ENCOUNTER — Encounter: Payer: Self-pay | Admitting: Family Medicine

## 2016-04-14 DIAGNOSIS — D692 Other nonthrombocytopenic purpura: Secondary | ICD-10-CM | POA: Diagnosis not present

## 2016-04-14 DIAGNOSIS — L918 Other hypertrophic disorders of the skin: Secondary | ICD-10-CM | POA: Diagnosis not present

## 2016-04-14 DIAGNOSIS — L814 Other melanin hyperpigmentation: Secondary | ICD-10-CM | POA: Diagnosis not present

## 2016-04-14 DIAGNOSIS — D1801 Hemangioma of skin and subcutaneous tissue: Secondary | ICD-10-CM | POA: Diagnosis not present

## 2016-04-14 DIAGNOSIS — L821 Other seborrheic keratosis: Secondary | ICD-10-CM | POA: Diagnosis not present

## 2016-04-14 DIAGNOSIS — L570 Actinic keratosis: Secondary | ICD-10-CM | POA: Diagnosis not present

## 2016-04-14 DIAGNOSIS — L720 Epidermal cyst: Secondary | ICD-10-CM | POA: Diagnosis not present

## 2016-04-14 DIAGNOSIS — Z85828 Personal history of other malignant neoplasm of skin: Secondary | ICD-10-CM | POA: Diagnosis not present

## 2016-04-14 DIAGNOSIS — D225 Melanocytic nevi of trunk: Secondary | ICD-10-CM | POA: Diagnosis not present

## 2016-04-14 NOTE — Telephone Encounter (Signed)
Jen-- I think this pt has [employer] Gold Plus. Can you get authorization? There is an ophthalmology referral from 11/2015 in EPIC.  Thanks!

## 2016-06-02 ENCOUNTER — Encounter: Payer: Self-pay | Admitting: Family Medicine

## 2016-06-03 DIAGNOSIS — Z961 Presence of intraocular lens: Secondary | ICD-10-CM | POA: Diagnosis not present

## 2016-06-03 DIAGNOSIS — H04223 Epiphora due to insufficient drainage, bilateral lacrimal glands: Secondary | ICD-10-CM | POA: Diagnosis not present

## 2016-06-03 DIAGNOSIS — H10413 Chronic giant papillary conjunctivitis, bilateral: Secondary | ICD-10-CM | POA: Diagnosis not present

## 2016-06-03 DIAGNOSIS — H353132 Nonexudative age-related macular degeneration, bilateral, intermediate dry stage: Secondary | ICD-10-CM | POA: Diagnosis not present

## 2016-07-29 ENCOUNTER — Ambulatory Visit: Payer: Commercial Managed Care - HMO | Admitting: *Deleted

## 2016-07-31 ENCOUNTER — Ambulatory Visit: Payer: Commercial Managed Care - HMO | Admitting: *Deleted

## 2016-08-05 ENCOUNTER — Encounter: Payer: Self-pay | Admitting: *Deleted

## 2016-08-05 ENCOUNTER — Ambulatory Visit (INDEPENDENT_AMBULATORY_CARE_PROVIDER_SITE_OTHER): Payer: Commercial Managed Care - HMO | Admitting: *Deleted

## 2016-08-05 VITALS — BP 138/70 | HR 74 | Resp 16 | Ht 60.0 in | Wt 156.4 lb

## 2016-08-05 DIAGNOSIS — Z0000 Encounter for general adult medical examination without abnormal findings: Secondary | ICD-10-CM

## 2016-08-05 NOTE — Progress Notes (Signed)
Pre visit review using our clinic review tool, if applicable. No additional management support is needed unless otherwise documented below in the visit note. 

## 2016-08-05 NOTE — Progress Notes (Addendum)
Subjective:   Cindy Velasquez is a 76 y.o. female who presents for Medicare Annual (Subsequent) preventive examination.  Review of Systems:  No ROS.  Medicare Wellness Visit.  Cardiac Risk Factors include: advanced age (>55men, >86 women);dyslipidemia;hypertension;obesity (BMI >30kg/m2)  Sleep patterns: Wakes up about 2 hours after going to sleep then is restless the rest of the night. Gets up once nightly to void.     Home Safety/Smoke Alarms: Lives at home w/ husband. Feels safe at home. Smoke alarms in home.    Living environment; residence and Firearm Safety: No firearms. Seat Belt Safety/Bike Helmet: Wears seat belt.   Counseling:   Eye Exam- Dr. Katy Fitch every 6 months, has beginning of macular degeneration per pt Dental- sees every 3 months, cannot remember name of provider   Female:   Pap- N/A       Mammo- 05/08/15, Bi-rads Category 1: Negative  Dexa scan- 06/18/11 low bone mass/osteopenia       CCS- 05/07/10 moderate diverticulosis, otherwise normal; 10 year recall     Objective:     Vitals: BP 138/70 (BP Location: Right Arm, Patient Position: Sitting, Cuff Size: Normal)   Pulse 74   Resp 16   Ht 5' (1.524 m)   Wt 156 lb 6.4 oz (70.9 kg)   SpO2 98%   BMI 30.54 kg/m   Body mass index is 30.54 kg/m.   Tobacco History  Smoking Status  . Former Smoker  . Packs/day: 0.50  . Years: 30.00  . Types: Cigarettes  . Quit date: 06/02/1991  Smokeless Tobacco  . Never Used     Counseling given: Not Answered   Past Medical History:  Diagnosis Date  . Adenomatous colon polyp   . Cancer ([place]) 03/2015   [place] R tibia   . Diverticulosis of colon   . GERD (gastroesophageal reflux disease)   . Hyperlipidemia   . Hyperplastic colon polyp   . Hypertension   . Osteoporosis    Past Surgical History:  Procedure Laterality Date  . EYE SURGERY  2012   B/L 01/2011  02/2011  . HAMMER TOE SURGERY     Bilateral  . ROTATOR CUFF REPAIR     Left  . TUBAL LIGATION     Family  History  Problem Relation Age of Onset  . Hypertension Mother   . Cancer Mother 33    ?  Marland Kitchen Hypertension Father   . Stroke Father   . Hypertension Brother   . Cancer Brother 35    testicular   . Hypertension Brother   . Heart disease Brother     quadruple bypass   History  Sexual Activity  . Sexual activity: Not Currently  . Partners: Male    Outpatient Encounter Prescriptions as of 08/05/2016  Medication Sig  . ammonium lactate (LAC-HYDRIN) 12 % cream Apply to skin bid  . hydrochlorothiazide (HYDRODIURIL) 25 MG tablet take 1/2 tablet by mouth every morning  . lisinopril (PRINIVIL,ZESTRIL) 20 MG tablet Take 1 tablet (20 mg total) by mouth daily.  . Multiple Vitamins-Minerals (ICAPS AREDS 2) CAPS Take 1 capsule by mouth daily.  . Probiotic Product (PROBIOTIC & ACIDOPHILUS EX ST PO) Take by mouth.  . rosuvastatin (CRESTOR) 20 MG tablet take 1 tablet by mouth once daily  . [DISCONTINUED] hydrochlorothiazide (HYDRODIURIL) 25 MG tablet 1/2 tab po qam  . [DISCONTINUED] benzonatate (TESSALON) 100 MG capsule Take 1 capsule (100 mg total) by mouth 3 (three) times daily as needed. (Patient not taking: Reported  on 08/05/2016)   No facility-administered encounter medications on file as of 08/05/2016.     Activities of Daily Living In your present state of health, do you have any difficulty performing the following activities: 08/05/2016 02/07/2016  Hearing? N N  Vision? N N  Difficulty concentrating or making decisions? N N  Walking or climbing stairs? N N  Dressing or bathing? N N  Doing errands, shopping? N N  Preparing Food and eating ? N -  Using the Toilet? N -  In the past six months, have you accidently leaked urine? Y -  Do you have problems with loss of bowel control? N -  Managing your Medications? N -  Managing your Finances? N -  Housekeeping or managing your Housekeeping? N -  Some recent data might be hidden    Patient Care Team: Ann Held, DO as PCP -  [place], MD as Consulting Physician (Ophthalmology)    Assessment:    Physical assessment deferred to PCP.  Exercise Activities and Dietary recommendations Current Exercise Habits: Structured exercise class, Type of exercise: yoga ("[employer] [employer] [facility]), Frequency (Times/Week): 4  Diet (meal preparation, eat out, water intake, caffeinated beverages, dairy products, fruits and vegetables): 3 meals daily. At home and eat out. Eats lots of chicken and vegetables. Averages 4 cups of coffee daily. Reports she does not drink enough water. Breakfast: small bowl of cereal Lunch: sandwich  Dinner: chicken and vegetables  Goals    . Increase physical activity    . Increase water intake      Fall Risk Fall Risk  08/05/2016 02/07/2016 11/27/2014 08/01/2013  Falls in the past year? No No No No   Depression Screen PHQ 2/9 Scores 08/05/2016 02/07/2016 11/27/2014 08/01/2013  PHQ - 2 Score 1 0 0 0     Cognitive Testing MMSE - Mini Mental State Exam 08/05/2016  Orientation to time 5  Orientation to Place 5  Registration 3  Attention/ Calculation 5  Recall 3  Language- name 2 objects 2  Language- repeat 1  Language- follow 3 step command 3  Language- read & follow direction 1  Write a sentence 1  Copy design 0  Total score 29    Immunization History  Administered Date(s) Administered  . H1N1 12/28/2008  . Influenza Whole 10/20/2007, 10/18/2008, 11/15/2012  . Influenza-Unspecified 12/20/2013  . Pneumococcal Conjugate-13 04/23/2015  . Pneumococcal Polysaccharide-23 12/28/2008  . Tdap 08/01/2013   Screening Tests Health Maintenance  Topic Date Due  . MAMMOGRAM  04/29/2016  . INFLUENZA VACCINE  08/08/2016 (Originally 07/08/2016)  . COLONOSCOPY  05/07/2020  . TETANUS/TDAP  08/02/2023  . DEXA SCAN  Completed  . ZOSTAVAX  Completed  . PNA vac Low Risk Adult  Completed      Plan:    Continue to eat heart healthy diet (full of fruits, vegetables,  whole grains, lean protein, water--limit salt, fat, and sugar intake) and increase physical activity as tolerated.  Continue doing brain stimulating activities (puzzles, reading, adult coloring books, staying active) to keep memory sharp.   Follow-up with Dr. Carollee Herter as scheduled. Pt reports she has had muscle soreness of R arm x3 months when she moves her arm a certain way. No pain today. Follow-up w/ PCP.  Bring a copy of your advanced directives to your next office visit.  Call [facility] to schedule your mammogram.  Try keeping a notepad by the bed to write down any thoughts or concerns that  wake you up at night.  During the course of the visit the patient was educated and counseled about the following appropriate screening and preventive services:   Vaccines to include Pneumoccal, Influenza, Hepatitis B, Td, Zostavax, HCV  Cardiovascular Disease  Colorectal cancer screening  Diabetes screening  Glaucoma screening  Mammography/PAP  Nutrition counseling   Patient Instructions (the written plan) was given to the patient.   Dorrene German, RN  08/05/2016    Reviewed--- Early Osmond, DO  08/12/2016

## 2016-08-05 NOTE — Patient Instructions (Signed)
Continue to eat heart healthy diet (full of fruits, vegetables, whole grains, lean protein, water--limit salt, fat, and sugar intake) and increase physical activity as tolerated.  Continue doing brain stimulating activities (puzzles, reading, adult coloring books, staying active) to keep memory sharp.   Follow-up with Dr. Carollee Herter as scheduled.  Bring a copy of your advanced directives to your next office visit.  Call [facility] to schedule your mammogram.

## 2016-08-12 ENCOUNTER — Ambulatory Visit: Payer: Commercial Managed Care - HMO | Admitting: Family Medicine

## 2016-08-14 ENCOUNTER — Encounter: Payer: Self-pay | Admitting: Family Medicine

## 2016-08-14 ENCOUNTER — Ambulatory Visit (HOSPITAL_BASED_OUTPATIENT_CLINIC_OR_DEPARTMENT_OTHER)
Admission: RE | Admit: 2016-08-14 | Discharge: 2016-08-14 | Disposition: A | Discharge status: Home / Self Care | Payer: Commercial Managed Care - HMO | Source: Ambulatory Visit | Attending: Family Medicine | Admitting: Family Medicine

## 2016-08-14 ENCOUNTER — Ambulatory Visit (INDEPENDENT_AMBULATORY_CARE_PROVIDER_SITE_OTHER): Payer: Commercial Managed Care - HMO | Admitting: Family Medicine

## 2016-08-14 VITALS — BP 158/59 | HR 68 | Temp 97.8°F | Resp 17 | Ht 60.0 in | Wt 156.4 lb

## 2016-08-14 DIAGNOSIS — M19011 Primary osteoarthritis, right shoulder: Secondary | ICD-10-CM | POA: Diagnosis not present

## 2016-08-14 DIAGNOSIS — E785 Hyperlipidemia, unspecified: Secondary | ICD-10-CM | POA: Diagnosis not present

## 2016-08-14 DIAGNOSIS — I10 Essential (primary) hypertension: Secondary | ICD-10-CM | POA: Diagnosis not present

## 2016-08-14 DIAGNOSIS — M79601 Pain in right arm: Secondary | ICD-10-CM | POA: Diagnosis not present

## 2016-08-14 MED ORDER — ROSUVASTATIN CALCIUM 20 MG PO TABS: 20.0000 mg | ORAL_TABLET | Freq: Every day | ORAL | 5 refills | Status: DC

## 2016-08-14 MED ORDER — HYDROCHLOROTHIAZIDE 25 MG PO TABS: 12.5000 mg | ORAL_TABLET | Freq: Every morning | ORAL | 3 refills | Status: DC

## 2016-08-14 MED ORDER — MELOXICAM 15 MG PO TABS: 15.0000 mg | ORAL_TABLET | Freq: Every day | ORAL | 0 refills | Status: DC

## 2016-08-14 MED ORDER — LISINOPRIL 20 MG PO TABS: 20.0000 mg | ORAL_TABLET | Freq: Every day | ORAL | 3 refills | Status: DC

## 2016-08-14 NOTE — Progress Notes (Signed)
Pre visit review using our clinic review tool, if applicable. No additional management support is needed unless otherwise documented below in the visit note. 

## 2016-08-14 NOTE — Patient Instructions (Signed)
Rotator Cuff Tendinitis  Rotator cuff tendinitis is inflammation of the tough, cord-like bands that connect muscle to bone (tendons) in your rotator cuff. Your rotator cuff is the collection of all the muscles and tendons that connect your arm to your shoulder. Your rotator cuff holds the head of your upper arm bone (humerus) in the cup (fossa) of your shoulder blade (scapula).  CAUSES  Rotator cuff tendinitis is usually caused by overusing the joint involved.   SIGNS AND SYMPTOMS  · Deep ache in the shoulder also felt on the outside upper arm over the shoulder muscle.  · Point tenderness over the area that is injured.  · Pain comes on gradually and becomes worse with lifting the arm to the side (abduction) or turning it inward (internal rotation).  · May lead to a chronic tear: When a rotator cuff tendon becomes inflamed, it runs the risk of losing its blood supply, causing some tendon fibers to die. This increases the risk that the tendon can fray and partially or completely tear.  DIAGNOSIS  Rotator cuff tendinitis is diagnosed by taking a medical history, performing a physical exam, and reviewing results of imaging exams. The medical history is useful to help determine the type of rotator cuff injury. The physical exam will include looking at the injured shoulder, feeling the injured area, and watching you do range-of-motion exercises. X-ray exams are typically done to rule out other causes of shoulder pain, such as fractures. MRI is the imaging exam usually used for significant shoulder injuries. Sometimes a dye study called CT arthrogram is done, but it is not as widely used as MRI. In some institutions, special ultrasound tests may also be used to aid in the diagnosis.  TREATMENT   Less Severe Cases  · Use of a sling to rest the shoulder for a short period of time. Prolonged use of the sling can cause stiffness, weakness, and loss of motion of the shoulder joint.  · Anti-inflammatory medicines, such as  ibuprofen or naproxen sodium, may be prescribed.  More Severe Cases  · Physical therapy.  · Use of steroid injections into the shoulder joint.  · Surgery.  HOME CARE INSTRUCTIONS   · Use a sling or splint until the pain decreases. Prolonged use of the sling can cause stiffness, weakness, and loss of motion of the shoulder joint.  · Apply ice to the injured area:    Put ice in a plastic bag.    Place a towel between your skin and the bag.    Leave the ice on for 20 minutes, 2-3 times a day.  · Try to avoid use other than gentle range of motion while your shoulder is painful. Use the shoulder and exercise only as directed by your health care provider. Stop exercises or range of motion if pain or discomfort increases, unless directed otherwise by your health care provider.  · Only take over-the-counter or prescription medicines for pain, discomfort, or fever as directed by your health care provider.  · If you were given a shoulder sling and straps (immobilizer), do not remove it except as directed, or until you see a health care provider for a follow-up exam. If you need to remove it, move your arm as little as possible or as directed.  · You may want to sleep on several pillows at night to lessen swelling and pain.  SEEK IMMEDIATE MEDICAL CARE IF:   · Your shoulder pain increases or new pain develops in your arm, hand,   or fingers and is not relieved with medicines.  · You have new, unexplained symptoms, especially increased numbness in the hands or loss of strength.  · You develop any worsening of the problems that brought you in for care.  · Your arm, hand, or fingers are numb or tingling.  · Your arm, hand, or fingers are swollen, painful, or turn white or blue.  MAKE SURE YOU:  · Understand these instructions.  · Will watch your condition.  · Will get help right away if you are not doing well or get worse.     This information is not intended to replace advice given to you by your health care provider. Make sure  you discuss any questions you have with your health care provider.     Document Released: 02/14/2004 Document Revised: 12/15/2014 Document Reviewed: 07/06/2013  Elsevier Interactive Patient Education ©2016 [employer]

## 2016-08-14 NOTE — Progress Notes (Signed)
Patient ID: Cindy Velasquez, female    DOB: Apr 28, 1941  Age: 75 y.o. MRN: [id number]    Subjective:  Subjective  HPI Cindy Velasquez presents for R upper arm pain x a few months.  No known injury.  She does go to the gym 4-5 x a week.  She has stopped doing any upper ext exercises because of the pain.   No otc meds tried.  She has tried ice and arnica with some relief but hurts again when she starts using her arm     Review of Systems  Constitutional: Negative for activity change, appetite change, fatigue and unexpected weight change.  Respiratory: Negative for cough and shortness of breath.   Cardiovascular: Negative for chest pain and palpitations.  Musculoskeletal: Positive for arthralgias. Negative for gait problem, joint swelling and myalgias.  Psychiatric/Behavioral: Negative for behavioral problems and dysphoric mood. The patient is not nervous/anxious.     History Past Medical History:  Diagnosis Date  . Adenomatous colon polyp   . Cancer ([place]) 03/2015   BCC R tibia   . Diverticulosis of colon   . GERD (gastroesophageal reflux disease)   . Hyperlipidemia   . Hyperplastic colon polyp   . Hypertension   . Osteoporosis     She has a past surgical history that includes Tubal ligation; Hammer toe surgery; Rotator cuff repair; and Eye surgery (2012).   Her family history includes Cancer (age of onset: 2) in her brother; Cancer (age of onset: 45) in her mother; Heart disease in her brother; Hypertension in her brother, brother, father, and mother; Stroke in her father.She reports that she quit smoking about 25 years ago. Her smoking use included Cigarettes. She has a 15.00 pack-year smoking history. She has never used smokeless tobacco. She reports that she drinks about 3.0 oz of alcohol per week . She reports that she does not use drugs.  Current Outpatient Prescriptions on File Prior to Visit  Medication Sig Dispense Refill  . ammonium lactate (LAC-HYDRIN) 12 % cream Apply to skin  bid 385 g 0  . Multiple Vitamins-Minerals (ICAPS AREDS 2) CAPS Take 1 capsule by mouth daily.    . Probiotic Product (PROBIOTIC & ACIDOPHILUS EX ST PO) Take by mouth.     No current facility-administered medications on file prior to visit.      Objective:  Objective  Physical Exam  Constitutional: She is oriented to person, place, and time. She appears well-developed and well-nourished.  HENT:  Head: Normocephalic and atraumatic.  Eyes: Conjunctivae and EOM are normal.  Neck: Normal range of motion. Neck supple. No JVD present. Carotid bruit is not present. No thyromegaly present.  Cardiovascular: Normal rate, regular rhythm and normal heart sounds.   No murmur heard. Pulmonary/Chest: Effort normal and breath sounds normal. No respiratory distress. She has no wheezes. She has no rales. She exhibits no tenderness.  Musculoskeletal: She exhibits no edema.  Neurological: She is alert and oriented to person, place, and time.  Psychiatric: She has a normal mood and affect. Her behavior is normal. Judgment and thought content normal.  Nursing note and vitals reviewed.  BP (!) 158/59 (BP Location: Left Arm, Patient Position: Sitting, Cuff Size: Normal)   Pulse 68   Temp 97.8 F (36.6 C) (Oral)   Resp 17   Ht 5' (1.524 m)   Wt 156 lb 6.4 oz (70.9 kg)   SpO2 98%   BMI 30.54 kg/m  Wt Readings from Last 3 Encounters:  08/14/16 156 lb  6.4 oz (70.9 kg)  08/05/16 156 lb 6.4 oz (70.9 kg)  03/22/16 160 lb 12 oz (72.9 kg)     Lab Results  Component Value Date   WBC 6.7 10/13/2014   HGB 14.4 10/13/2014   HCT 44.4 10/13/2014   PLT 262 10/13/2014   GLUCOSE 78 08/14/2016   CHOL 154 08/14/2016   TRIG 144.0 08/14/2016   HDL 44.00 08/14/2016   LDLDIRECT 129.1 08/23/2007   LDLCALC 81 08/14/2016   ALT 25 08/14/2016   AST 25 08/14/2016   NA 140 08/14/2016   K 5.0 08/14/2016   CL 102 08/14/2016   CREATININE 0.58 08/14/2016   BUN 15 08/14/2016   CO2 34 (H) 08/14/2016   TSH 1.813  10/13/2014    Dg Chest 2 View  Result Date: 02/25/2016 CLINICAL DATA:  Cough for 1 week. Low grade fever. Smoker. Initial encounter. EXAM: CHEST  2 VIEW COMPARISON:  None. FINDINGS: The chest is hyperexpanded. The lungs are clear. Heart size is normal. No pneumothorax or pleural effusion. No focal bony abnormality. IMPRESSION: No acute disease. Electronically Signed   By: Inge Rise M.D.   On: 02/25/2016 14:53     Assessment & Plan:  Plan  I have changed Ms. Cindy Velasquez rosuvastatin and hydrochlorothiazide. I am also having her start on meloxicam. Additionally, I am having her maintain her ammonium lactate, ICAPS AREDS 2, Probiotic Product (PROBIOTIC & ACIDOPHILUS EX ST PO), and lisinopril.  Meds ordered this encounter  Medications  . rosuvastatin (CRESTOR) 20 MG tablet    Sig: Take 1 tablet (20 mg total) by mouth daily.    Dispense:  30 tablet    Refill:  5  . lisinopril (PRINIVIL,ZESTRIL) 20 MG tablet    Sig: Take 1 tablet (20 mg total) by mouth daily.    Dispense:  90 tablet    Refill:  3  . hydrochlorothiazide (HYDRODIURIL) 25 MG tablet    Sig: Take 0.5 tablets (12.5 mg total) by mouth every morning.    Dispense:  45 tablet    Refill:  3  . meloxicam (MOBIC) 15 MG tablet    Sig: Take 1 tablet (15 mg total) by mouth daily.    Dispense:  30 tablet    Refill:  0    Problem List Items Addressed This Visit      Unprioritized   Essential hypertension    Stable con't meds      Relevant Medications   rosuvastatin (CRESTOR) 20 MG tablet   lisinopril (PRINIVIL,ZESTRIL) 20 MG tablet   hydrochlorothiazide (HYDRODIURIL) 25 MG tablet   Hyperlipidemia - Primary    con't meds Check labs      Relevant Medications   rosuvastatin (CRESTOR) 20 MG tablet   lisinopril (PRINIVIL,ZESTRIL) 20 MG tablet   hydrochlorothiazide (HYDRODIURIL) 25 MG tablet   Other Relevant Orders   Comprehensive metabolic panel (Completed)   Lipid panel (Completed)    Other Visit Diagnoses    Right  arm pain       Relevant Medications   meloxicam (MOBIC) 15 MG tablet   Other Relevant Orders   Ambulatory referral to Sports Medicine   DG Shoulder Right (Completed)      Follow-up: No Follow-up on file.  Ann Held, DO

## 2016-08-15 LAB — LIPID PANEL
Cholesterol: 154 mg/dL (ref 0–200)
HDL: 44 mg/dL (ref >39.00)
LDL Cholesterol: 81 mg/dL (ref 0–99)
NonHDL: 109.9
Total CHOL/HDL Ratio: 3
Triglycerides: 144 mg/dL (ref 0.0–149.0)
VLDL: 28.8 mg/dL (ref 0.0–40.0)

## 2016-08-15 LAB — COMPREHENSIVE METABOLIC PANEL
ALK PHOS: 71 U/L (ref 39–117)
ALT: 25 U/L (ref 0–35)
AST: 25 U/L (ref 0–37)
Albumin: 4 g/dL (ref 3.5–5.2)
BUN: 15 mg/dL (ref 6–23)
CHLORIDE: 102 meq/L (ref 96–112)
CO2: 34 meq/L — AB (ref 19–32)
Calcium: 9.5 mg/dL (ref 8.4–10.5)
Creatinine, Ser: 0.58 mg/dL (ref 0.40–1.20)
GFR: 107.7 mL/min (ref >60.00)
GLUCOSE: 78 mg/dL (ref 70–99)
POTASSIUM: 5 meq/L (ref 3.5–5.1)
SODIUM: 140 meq/L (ref 135–145)
TOTAL PROTEIN: 7.5 g/dL (ref 6.0–8.3)
Total Bilirubin: 0.5 mg/dL (ref 0.2–1.2)

## 2016-08-17 NOTE — Assessment & Plan Note (Signed)
con't meds  Check labs 

## 2016-08-17 NOTE — Assessment & Plan Note (Signed)
Stable con't meds 

## 2016-08-18 ENCOUNTER — Ambulatory Visit (INDEPENDENT_AMBULATORY_CARE_PROVIDER_SITE_OTHER): Payer: Commercial Managed Care - HMO | Admitting: Family Medicine

## 2016-08-18 ENCOUNTER — Encounter: Payer: Self-pay | Admitting: Family Medicine

## 2016-08-18 DIAGNOSIS — M25511 Pain in right shoulder: Secondary | ICD-10-CM | POA: Diagnosis not present

## 2016-08-18 NOTE — Patient Instructions (Signed)
You have rotator cuff impingement Try to avoid painful activities (overhead activities, lifting with extended arm) as much as possible. Meloxicam 15mg  daily with food for pain and inflammation - generally take regularly for 7-10 days at least then as needed. Can take tylenol in addition to this. Subacromial injection may be beneficial to help with pain and to decrease inflammation. Consider physical therapy with transition to home exercise program. Do home exercise program with theraband and scapular stabilization exercises daily - these are very important for long term relief even if an injection was given.  3 sets of 10 once a day. If not improving at follow-up we will consider further imaging, injection, physical therapy, and/or nitro patches. Follow up with me in 5-6 weeks for reevaluation.

## 2016-08-20 DIAGNOSIS — M25511 Pain in right shoulder: Secondary | ICD-10-CM | POA: Insufficient documentation

## 2016-08-20 NOTE — Assessment & Plan Note (Signed)
independently reviewed radiographs - she does have some arthritis but do not think this is the cause of her symptoms.  Discussed options - she will start with meloxicam, home exercise program which was reviewed today.  She will consider injection, PT, nitro patches if not improving.  F/u in 5-6 weeks.

## 2016-08-20 NOTE — Progress Notes (Signed)
PCP: Ann Held, DO  Subjective:   HPI: Patient is a 75 y.o. female here for right shoulder pain.  Patient reports she woke up one morning 3 months ago with pain lateral right upper arm/shoulder. Pain is 1/10 at rest, up to 7/10 and sharp at worst with reaching and overhead motions. Recalls in July carrying some things and she jammed into right shoulder but was hurting prior to this. Has been icing, tried arnica. Pain reaching behind back. No skin changes, numbness.  Past Medical History:  Diagnosis Date  . Adenomatous colon polyp   . Cancer ([place]) 03/2015   BCC R tibia   . Diverticulosis of colon   . GERD (gastroesophageal reflux disease)   . Hyperlipidemia   . Hyperplastic colon polyp   . Hypertension   . Osteoporosis     Current Outpatient Prescriptions on File Prior to Visit  Medication Sig Dispense Refill  . ammonium lactate (LAC-HYDRIN) 12 % cream Apply to skin bid 385 g 0  . hydrochlorothiazide (HYDRODIURIL) 25 MG tablet Take 0.5 tablets (12.5 mg total) by mouth every morning. 45 tablet 3  . lisinopril (PRINIVIL,ZESTRIL) 20 MG tablet Take 1 tablet (20 mg total) by mouth daily. 90 tablet 3  . meloxicam (MOBIC) 15 MG tablet Take 1 tablet (15 mg total) by mouth daily. 30 tablet 0  . Multiple Vitamins-Minerals (ICAPS AREDS 2) CAPS Take 1 capsule by mouth daily.    . Probiotic Product (PROBIOTIC & ACIDOPHILUS EX ST PO) Take by mouth.    . rosuvastatin (CRESTOR) 20 MG tablet Take 1 tablet (20 mg total) by mouth daily. 30 tablet 5   No current facility-administered medications on file prior to visit.     Past Surgical History:  Procedure Laterality Date  . EYE SURGERY  2012   B/L 01/2011  02/2011  . HAMMER TOE SURGERY     Bilateral  . ROTATOR CUFF REPAIR     Left  . TUBAL LIGATION      No Known Allergies  Social History   Social History  . Marital status: Married    Spouse name: N/A  . Number of children: N/A  . Years of education: N/A   Occupational  History  . 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 Smoking status: Former Smoker    Packs/day: 0.50    Years: 30.00    Types: Cigarettes    Quit date: 06/02/1991  . Smokeless tobacco: Never Used  . Alcohol use 3.0 oz/week    5 Glasses of wine per week  . Drug use: No  . Sexual activity: Not Currently    Partners: Male   Other Topics Concern  . Not on file   Social History Narrative   Exercise--  [employer]--yoga and cardio 3x a week    Family History  Problem Relation Age of Onset  . Hypertension Mother   . Cancer Mother 23    ?  Marland Kitchen Hypertension Father   . Stroke Father   . Hypertension Brother   . Cancer Brother 35    testicular   . Hypertension Brother   . Heart disease Brother     quadruple bypass    BP (!) 144/73   Pulse 78   Ht 5\' 1"  (1.549 m)   Wt 156 lb (70.8 kg)   BMI 29.48 kg/m   Review of Systems: See HPI above.    Objective:  Physical Exam:  Gen: NAD, comfortable in exam room  Right shoulder: No swelling, ecchymoses.  No gross deformity. No TTP AC joint or biceps tendon. FROM with painful arc. Positive Hawkins, negative Neers. Negative Yergasons. Strength 5/5 with empty can and resisted internal/external rotation.  Pain empty can mildly. Negative apprehension. NV intact distally.  Left shoulder: FROM without pain.    Assessment & Plan:  1. Right shoulder pain - independently reviewed radiographs - she does have some arthritis but do not think this is the cause of her symptoms.  Discussed options - she will start with meloxicam, home exercise program which was reviewed today.  She will consider injection, PT, nitro patches if not improving.  F/u in 5-6 weeks.

## 2016-10-01 ENCOUNTER — Ambulatory Visit (INDEPENDENT_AMBULATORY_CARE_PROVIDER_SITE_OTHER): Payer: Commercial Managed Care - HMO | Admitting: Family Medicine

## 2016-10-01 ENCOUNTER — Encounter: Payer: Self-pay | Admitting: Family Medicine

## 2016-10-01 DIAGNOSIS — M25511 Pain in right shoulder: Secondary | ICD-10-CM

## 2016-10-01 MED ORDER — NITROGLYCERIN 0.2 MG/HR TD PT24
MEDICATED_PATCH | TRANSDERMAL | 1 refills | Status: DC
Start: 2016-10-01 — End: 2017-01-07

## 2016-10-01 NOTE — Patient Instructions (Signed)
Continue your home exercises. Call me after you discuss with your insurance company about physical therapy - if you want to do this we will put in an order if it's not too expensive. Nitro patches - 1/4th patch to affected shoulder, change daily. Follow up with me in 6 weeks.

## 2016-10-02 ENCOUNTER — Other Ambulatory Visit: Payer: Self-pay | Admitting: Family Medicine

## 2016-10-02 NOTE — Progress Notes (Signed)
PCP: Ann Held, DO  Subjective:   HPI: Patient is a 75 y.o. female here for right shoulder pain.  9/11: Patient reports she woke up one morning 3 months ago with pain lateral right upper arm/shoulder. Pain is 1/10 at rest, up to 7/10 and sharp at worst with reaching and overhead motions. Recalls in July carrying some things and she jammed into right shoulder but was hurting prior to this. Has been icing, tried arnica. Pain reaching behind back. No skin changes, numbness.  10/25: Patient reports she is 40% improved from last visit. Doing home exercises. Not taking any medicines. Pain lateral upper arm. Worse reaching behind her back. Pain level 4/10, sharp. Worse lying on right side also. No skin changes, numbness.  Past Medical History:  Diagnosis Date  . Adenomatous colon polyp   . Cancer ([place]) 03/2015   BCC R tibia   . Diverticulosis of colon   . GERD (gastroesophageal reflux disease)   . Hyperlipidemia   . Hyperplastic colon polyp   . Hypertension   . Osteoporosis     Current Outpatient Prescriptions on File Prior to Visit  Medication Sig Dispense Refill  . ammonium lactate (LAC-HYDRIN) 12 % cream Apply to skin bid 385 g 0  . hydrochlorothiazide (HYDRODIURIL) 25 MG tablet Take 0.5 tablets (12.5 mg total) by mouth every morning. 45 tablet 3  . lisinopril (PRINIVIL,ZESTRIL) 20 MG tablet Take 1 tablet (20 mg total) by mouth daily. 90 tablet 3  . meloxicam (MOBIC) 15 MG tablet Take 1 tablet (15 mg total) by mouth daily. 30 tablet 0  . Multiple Vitamins-Minerals (ICAPS AREDS 2) CAPS Take 1 capsule by mouth daily.    . Probiotic Product (PROBIOTIC & ACIDOPHILUS EX ST PO) Take by mouth.     No current facility-administered medications on file prior to visit.     Past Surgical History:  Procedure Laterality Date  . EYE SURGERY  2012   B/L 01/2011  02/2011  . HAMMER TOE SURGERY     Bilateral  . ROTATOR CUFF REPAIR     Left  . TUBAL LIGATION      No Known  Allergies  Social History   Social History  . Marital status: Married    Spouse name: N/A  . Number of children: N/A  . Years of education: N/A   Occupational History  . 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 Smoking status: Former Smoker    Packs/day: 0.50    Years: 30.00    Types: Cigarettes    Quit date: 06/02/1991  . Smokeless tobacco: Never Used  . Alcohol use 3.0 oz/week    5 Glasses of wine per week  . Drug use: No  . Sexual activity: Not Currently    Partners: Male   Other Topics Concern  . Not on file   Social History Narrative   Exercise--  [employer]--yoga and cardio 3x a week    Family History  Problem Relation Age of Onset  . Hypertension Mother   . Cancer Mother 45    ?  Marland Kitchen Hypertension Father   . Stroke Father   . Hypertension Brother   . Cancer Brother 35    testicular   . Hypertension Brother   . Heart disease Brother     quadruple bypass    Pulse 78   Ht 5' (1.524 m)   Wt 152 lb (68.9 kg)   BMI 29.69 kg/m   Review of Systems:  See HPI above.    Objective:  Physical Exam:  Gen: NAD, comfortable in exam room  Right shoulder: No swelling, ecchymoses.  No gross deformity. No TTP AC joint or biceps tendon. FROM with painful arc. Positive Hawkins, negative Neers. Negative Yergasons. Strength 5/5 with empty can and resisted internal/external rotation.  Pain empty can mildly. Negative apprehension. NV intact distally.  Left shoulder: FROM without pain.    Assessment & Plan:  1. Right shoulder pain - 2/2 rotator cuff impingement.  We discussed options.  She is going to look into the cost of physical therapy and call us if she would like to proceed with this.  In meantime she will continue home exercises, add nitro patches (discussed risks of headache, skin irritation).  F/u in 6 weeks.

## 2016-10-02 NOTE — Assessment & Plan Note (Signed)
2/2 rotator cuff impingement.  We discussed options.  She is going to look into the cost of physical therapy and call us if she would like to proceed with this.  In meantime she will continue home exercises, add nitro patches (discussed risks of headache, skin irritation).  F/u in 6 weeks.

## 2016-10-21 ENCOUNTER — Ambulatory Visit (INDEPENDENT_AMBULATORY_CARE_PROVIDER_SITE_OTHER): Payer: Commercial Managed Care - HMO | Admitting: Medical

## 2016-10-21 ENCOUNTER — Encounter: Payer: Self-pay | Admitting: Medical

## 2016-10-21 VITALS — BP 142/78 | HR 76 | Temp 97.7°F | Resp 16 | Ht 60.0 in | Wt 158.0 lb

## 2016-10-21 DIAGNOSIS — H6123 Impacted cerumen, bilateral: Secondary | ICD-10-CM

## 2016-10-21 DIAGNOSIS — J309 Allergic rhinitis, unspecified: Secondary | ICD-10-CM

## 2016-10-21 MED ORDER — FLUTICASONE PROPIONATE 50 MCG/ACT NA SUSP: 2.0000 | Freq: Every day | NASAL | 1 refills | Status: DC

## 2016-10-21 NOTE — Progress Notes (Addendum)
Pre visit review using our clinic review tool, if applicable. No additional management support is needed unless otherwise documented below in the visit note.  Bilateral ears flushed w/ warm water until ear canal clear and TMs clearly visualized per verbal order from provider. Returned yellow/brown cerumen from both ears. Pt tolerated procedure well with no adverse effects noted.

## 2016-10-21 NOTE — Patient Instructions (Addendum)
Your ear wax was lavaged successfully today. If you feel re accumulation in future then can use debrox otc and then come in for lavage.  For likely recent allergy signs or symptoms rx flonase.  If any worse or new signs or symptoms then notify us.  Follow up as regularly scheduled or as needed

## 2016-10-21 NOTE — Progress Notes (Addendum)
Subjective:    Patient ID: Cindy Velasquez, female    DOB: September 26, 1941, 75 y.o.   MRN: [id number]  HPI  Pt in for ear congestion/decreased hearing. Hx of ear blocked by cerumen. Felt mild symptoms for 2 weeks. Hx of cerumen impaction in the past.  Some preceding nasal congestion past 2-3 weeks. Some allergy symptoms in the past. But not sinus pain, no fever, no chills and no sweats. No teeth pain. She thinks she may have allergies.    Review of Systems  Constitutional: Negative for chills, fatigue and fever.  HENT: Positive for congestion and ear pain. Negative for hearing loss, postnasal drip, rhinorrhea, sinus pain, sinus pressure and trouble swallowing.        Rt ear pressure/wax obstruction. Before lavage.  Lt ear faint pressure symptoms. Much less than rt.  Respiratory: Negative for cough, choking, chest tightness, shortness of breath and wheezing.   Cardiovascular: Negative for chest pain and palpitations.  Gastrointestinal: Negative for abdominal pain.  Musculoskeletal: Negative for back pain.  Neurological: Negative for dizziness, weakness and headaches.  Hematological: Negative for adenopathy. Does not bruise/bleed easily.  Psychiatric/Behavioral: Negative for behavioral problems and confusion.    Past Medical History:  Diagnosis Date  . Adenomatous colon polyp   . Cancer ([place]) 03/2015   BCC R tibia   . Diverticulosis of colon   . GERD (gastroesophageal reflux disease)   . Hyperlipidemia   . Hyperplastic colon polyp   . Hypertension   . Osteoporosis      Social History   Social History  . Marital status: Married    Spouse name: N/A  . Number of children: N/A  . Years of education: N/A   Occupational History  . 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 Smoking status: Former Smoker    Packs/day: 0.50    Years: 30.00    Types: Cigarettes    Quit date: 06/02/1991  . Smokeless tobacco: Never Used  . Alcohol use 3.0 oz/week      5 Glasses of wine per week  . Drug use: No  . Sexual activity: Not Currently    Partners: Male   Other Topics Concern  . Not on file   Social History Narrative   Exercise--  [employer]--yoga and cardio 3x a week    Past Surgical History:  Procedure Laterality Date  . EYE SURGERY  2012   B/L 01/2011  02/2011  . HAMMER TOE SURGERY     Bilateral  . ROTATOR CUFF REPAIR     Left  . TUBAL LIGATION      Family History  Problem Relation Age of Onset  . Hypertension Mother   . Cancer Mother 37    ?  Marland Kitchen Hypertension Father   . Stroke Father   . Hypertension Brother   . Cancer Brother 35    testicular   . Hypertension Brother   . Heart disease Brother     quadruple bypass    No Known Allergies  Current Outpatient Prescriptions on File Prior to Visit  Medication Sig Dispense Refill  . ammonium lactate (LAC-HYDRIN) 12 % cream Apply to skin bid 385 g 0  . hydrochlorothiazide (HYDRODIURIL) 25 MG tablet Take 0.5 tablets (12.5 mg total) by mouth every morning. 45 tablet 3  . lisinopril (PRINIVIL,ZESTRIL) 20 MG tablet Take 1 tablet (20 mg total) by mouth daily. 90 tablet 3  . Multiple Vitamins-Minerals (ICAPS AREDS 2) CAPS Take 1 capsule by mouth  daily.    . nitroGLYCERIN (NITRODUR - DOSED IN MG/24 HR) 0.2 mg/hr patch Apply 1/4th patch to affected shoulder, change daily 30 patch 1  . Probiotic Product (PROBIOTIC & ACIDOPHILUS EX ST PO) Take by mouth.    . rosuvastatin (CRESTOR) 20 MG tablet take 1 tablet by mouth once daily 30 tablet 5   No current facility-administered medications on file prior to visit.     BP (!) 142/78 (BP Location: Left Arm, Patient Position: Sitting, Cuff Size: Large)   Pulse 76   Temp 97.7 F (36.5 C) (Oral)   Resp 16   Ht 5' (1.524 m)   Wt 158 lb (71.7 kg)   SpO2 97%   BMI 30.86 kg/m       Objective:   Physical Exam  General  Mental Status - Alert. General Appearance - Well groomed. Not in acute distress.  Skin Rashes- No  Rashes.  HEENT Head- Normal. Ear Auditory Canal - Left- cerumen impaction pre lavage. No portion of tim seen. Right - cerumen impaction pre lavage. No portion of tim seen (post lavage canals clear and tm intact.) Eye Sclera/Conjunctiva- Left- Normal. Right- Normal. Nose & Sinuses Nasal Mucosa- Left-  Boggy and Congested. Right-  Boggy and  Congested.Bilateral maxillary and frontal sinus pressure. Mouth & Throat Lips: Upper Lip- Normal: no dryness, cracking, pallor, cyanosis, or vesicular eruption. Lower Lip-Normal: no dryness, cracking, pallor, cyanosis or vesicular eruption. Buccal Mucosa- Bilateral- No Aphthous ulcers. Oropharynx- No Discharge or Erythema. Tonsils: Characteristics- Bilateral- No Erythema or Congestion. Size/Enlargement- Bilateral- No enlargement. Discharge- bilateral-None.  Neck Neck- Supple. No Masses.   Chest and Lung Exam Auscultation: Breath Sounds:-Clear even and unlabored.  Cardiovascular Auscultation:Rythm- Regular, rate and rhythm. Murmurs & Other Heart Sounds:Ausculatation of the heart reveal- No Murmurs.  Lymphatic Head & Neck General Head & Neck Lymphatics: Bilateral: Description- No Localized lymphadenopathy.       Assessment & Plan:  Your ear wax was lavaged successfully today. If you feel re accumulation in future then can use debrox otc and then come in for lavage.  For likely recent allergy signs or symptoms rx flonase.  If any worse or new signs or symptoms then notify us.  Follow up as regularly scheduled or as needed   Shonia Skilling, Percell Miller, PA-C

## 2016-11-12 ENCOUNTER — Ambulatory Visit: Payer: Commercial Managed Care - HMO | Admitting: Family Medicine

## 2016-11-25 ENCOUNTER — Encounter: Payer: Self-pay | Admitting: Family Medicine

## 2016-11-25 ENCOUNTER — Ambulatory Visit (INDEPENDENT_AMBULATORY_CARE_PROVIDER_SITE_OTHER): Payer: Commercial Managed Care - HMO | Admitting: Family Medicine

## 2016-11-25 DIAGNOSIS — M25511 Pain in right shoulder: Secondary | ICD-10-CM

## 2016-11-25 NOTE — Patient Instructions (Signed)
Let me know if you want to do physical therapy and we can put in the referral. Follow up with me after the new year for a no charge visit for a cortisone shot.

## 2016-11-26 NOTE — Assessment & Plan Note (Signed)
2/2 rotator cuff impingement.  Only mild improvement since last visit with home exercises, nitro patches.  She will return first week of January to do subacromial injection.  Will also consider physical therapy.

## 2016-11-26 NOTE — Progress Notes (Signed)
PCP: Ann Held, DO  Subjective:   HPI: Patient is a 75 y.o. female here for right shoulder pain.  9/11: Patient reports she woke up one morning 3 months ago with pain lateral right upper arm/shoulder. Pain is 1/10 at rest, up to 7/10 and sharp at worst with reaching and overhead motions. Recalls in July carrying some things and she jammed into right shoulder but was hurting prior to this. Has been icing, tried arnica. Pain reaching behind back. No skin changes, numbness.  10/25: Patient reports she is 40% improved from last visit. Doing home exercises. Not taking any medicines. Pain lateral upper arm. Worse reaching behind her back. Pain level 4/10, sharp. Worse lying on right side also. No skin changes, numbness.  12/19: Patient reports she has improved since last visit. Pain level 3/10, sharp. Worse with lifting coffee pot, reaching behind back, and out to side. No swelling. Motion has improved actively. No skin changes, numbness.  Past Medical History:  Diagnosis Date  . Adenomatous colon polyp   . Cancer ([place]) 03/2015   BCC R tibia   . Diverticulosis of colon   . GERD (gastroesophageal reflux disease)   . Hyperlipidemia   . Hyperplastic colon polyp   . Hypertension   . Osteoporosis     Current Outpatient Prescriptions on File Prior to Visit  Medication Sig Dispense Refill  . ammonium lactate (LAC-HYDRIN) 12 % cream Apply to skin bid 385 g 0  . fluticasone (FLONASE) 50 MCG/ACT nasal spray Place 2 sprays into both nostrils daily. 16 g 1  . hydrochlorothiazide (HYDRODIURIL) 25 MG tablet Take 0.5 tablets (12.5 mg total) by mouth every morning. 45 tablet 3  . lisinopril (PRINIVIL,ZESTRIL) 20 MG tablet Take 1 tablet (20 mg total) by mouth daily. 90 tablet 3  . Multiple Vitamins-Minerals (ICAPS AREDS 2) CAPS Take 1 capsule by mouth daily.    . nitroGLYCERIN (NITRODUR - DOSED IN MG/24 HR) 0.2 mg/hr patch Apply 1/4th patch to affected shoulder, change daily  30 patch 1  . Probiotic Product (PROBIOTIC & ACIDOPHILUS EX ST PO) Take by mouth.    . rosuvastatin (CRESTOR) 20 MG tablet take 1 tablet by mouth once daily 30 tablet 5   No current facility-administered medications on file prior to visit.     Past Surgical History:  Procedure Laterality Date  . EYE SURGERY  2012   B/L 01/2011  02/2011  . HAMMER TOE SURGERY     Bilateral  . ROTATOR CUFF REPAIR     Left  . TUBAL LIGATION      No Known Allergies  Social History   Social History  . Marital status: Married    Spouse name: N/A  . Number of children: N/A  . Years of education: N/A   Occupational History  . 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 Smoking status: Former Smoker    Packs/day: 0.50    Years: 30.00    Types: Cigarettes    Quit date: 06/02/1991  . Smokeless tobacco: Never Used  . Alcohol use 3.0 oz/week    5 Glasses of wine per week  . Drug use: No  . Sexual activity: Not Currently    Partners: Male   Other Topics Concern  . Not on file   Social History Narrative   Exercise--  [employer]--yoga and cardio 3x a week    Family History  Problem Relation Age of Onset  . Hypertension Mother   . Cancer  Mother 30    ?  Marland Kitchen Hypertension Father   . Stroke Father   . Hypertension Brother   . Cancer Brother 35    testicular   . Hypertension Brother   . Heart disease Brother     quadruple bypass    BP (!) 155/75   Pulse 81   Ht 5\' 1"  (1.549 m)   Wt 155 lb (70.3 kg)   BMI 29.29 kg/m   Review of Systems: See HPI above.    Objective:  Physical Exam:  Gen: NAD, comfortable in exam room  Right shoulder: No swelling, ecchymoses.  No gross deformity. No TTP AC joint or biceps tendon. FROM with painful arc. Positive Hawkins, negative Neers. Negative Yergasons. Strength 5/5 with empty can and resisted internal/external rotation.  Pain empty can mildly. Negative apprehension. NV intact distally.  Left shoulder: FROM  without pain.    Assessment & Plan:  1. Right shoulder pain - 2/2 rotator cuff impingement.  Only mild improvement since last visit with home exercises, nitro patches.  She will return first week of January to do subacromial injection.  Will also consider physical therapy.

## 2016-12-10 DIAGNOSIS — H353132 Nonexudative age-related macular degeneration, bilateral, intermediate dry stage: Secondary | ICD-10-CM | POA: Diagnosis not present

## 2016-12-10 DIAGNOSIS — Z961 Presence of intraocular lens: Secondary | ICD-10-CM | POA: Diagnosis not present

## 2016-12-10 DIAGNOSIS — H26492 Other secondary cataract, left eye: Secondary | ICD-10-CM | POA: Diagnosis not present

## 2016-12-10 DIAGNOSIS — H10413 Chronic giant papillary conjunctivitis, bilateral: Secondary | ICD-10-CM | POA: Diagnosis not present

## 2016-12-10 DIAGNOSIS — H04223 Epiphora due to insufficient drainage, bilateral lacrimal glands: Secondary | ICD-10-CM | POA: Diagnosis not present

## 2016-12-11 ENCOUNTER — Ambulatory Visit (INDEPENDENT_AMBULATORY_CARE_PROVIDER_SITE_OTHER): Payer: Commercial Managed Care - HMO | Admitting: Family Medicine

## 2016-12-11 ENCOUNTER — Encounter: Payer: Self-pay | Admitting: Family Medicine

## 2016-12-11 VITALS — BP 144/74 | HR 96 | Ht 60.0 in | Wt 155.0 lb

## 2016-12-11 DIAGNOSIS — M25511 Pain in right shoulder: Secondary | ICD-10-CM | POA: Diagnosis not present

## 2016-12-11 MED ORDER — METHYLPREDNISOLONE ACETATE 40 MG/ML IJ SUSP
40.0000 mg | Freq: Once | INTRAMUSCULAR | Status: AC
  Administered 2016-12-11: 40 mg

## 2016-12-12 NOTE — Assessment & Plan Note (Addendum)
2/2 rotator cuff impingement.  Only mild improvement since last visit with home exercises, nitro patches.  Subacromial injection given today.  Will also consider physical therapy.    After informed written consent, patient was seated on exam table. Right shoulder was prepped with alcohol swab and utilizing posterior approach, patient's right subacromial space was injected with 3:1 bupivicaine: depomedrol. Patient tolerated the procedure well without immediate complications.

## 2016-12-12 NOTE — Progress Notes (Signed)
PCP: Ann Held, DO  Subjective:   HPI: Patient is a 76 y.o. female here for right shoulder pain.  9/11: Patient reports she woke up one morning 3 months ago with pain lateral right upper arm/shoulder. Pain is 1/10 at rest, up to 7/10 and sharp at worst with reaching and overhead motions. Recalls in July carrying some things and she jammed into right shoulder but was hurting prior to this. Has been icing, tried arnica. Pain reaching behind back. No skin changes, numbness.  10/25: Patient reports she is 40% improved from last visit. Doing home exercises. Not taking any medicines. Pain lateral upper arm. Worse reaching behind her back. Pain level 4/10, sharp. Worse lying on right side also. No skin changes, numbness.  12/19: Patient reports she has improved since last visit. Pain level 3/10, sharp. Worse with lifting coffee pot, reaching behind back, and out to side. No swelling. Motion has improved actively. No skin changes, numbness.  12/11/16: Patient returns for cortisone injection.  Past Medical History:  Diagnosis Date  . Adenomatous colon polyp   . Cancer ([place]) 03/2015   BCC R tibia   . Diverticulosis of colon   . GERD (gastroesophageal reflux disease)   . Hyperlipidemia   . Hyperplastic colon polyp   . Hypertension   . Osteoporosis     Current Outpatient Prescriptions on File Prior to Visit  Medication Sig Dispense Refill  . ammonium lactate (LAC-HYDRIN) 12 % cream Apply to skin bid 385 g 0  . fluticasone (FLONASE) 50 MCG/ACT nasal spray Place 2 sprays into both nostrils daily. 16 g 1  . hydrochlorothiazide (HYDRODIURIL) 25 MG tablet Take 0.5 tablets (12.5 mg total) by mouth every morning. 45 tablet 3  . lisinopril (PRINIVIL,ZESTRIL) 20 MG tablet Take 1 tablet (20 mg total) by mouth daily. 90 tablet 3  . Multiple Vitamins-Minerals (ICAPS AREDS 2) CAPS Take 1 capsule by mouth daily.    . nitroGLYCERIN (NITRODUR - DOSED IN MG/24 HR) 0.2 mg/hr patch  Apply 1/4th patch to affected shoulder, change daily 30 patch 1  . Probiotic Product (PROBIOTIC & ACIDOPHILUS EX ST PO) Take by mouth.    . rosuvastatin (CRESTOR) 20 MG tablet take 1 tablet by mouth once daily 30 tablet 5   No current facility-administered medications on file prior to visit.     Past Surgical History:  Procedure Laterality Date  . EYE SURGERY  2012   B/L 01/2011  02/2011  . HAMMER TOE SURGERY     Bilateral  . ROTATOR CUFF REPAIR     Left  . TUBAL LIGATION      No Known Allergies  Social History   Social History  . Marital status: Married    Spouse name: N/A  . Number of children: N/A  . Years of education: N/A   Occupational History  . 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 Smoking status: Former Smoker    Packs/day: 0.50    Years: 30.00    Types: Cigarettes    Quit date: 06/02/1991  . Smokeless tobacco: Never Used  . Alcohol use 3.0 oz/week    5 Glasses of wine per week  . Drug use: No  . Sexual activity: Not Currently    Partners: Male   Other Topics Concern  . Not on file   Social History Narrative   Exercise--  [employer]--yoga and cardio 3x a week    Family History  Problem Relation Age of Onset  .  Hypertension Mother   . Cancer Mother 57    ?  Marland Kitchen Hypertension Father   . Stroke Father   . Hypertension Brother   . Cancer Brother 35    testicular   . Hypertension Brother   . Heart disease Brother     quadruple bypass    BP (!) 144/74   Pulse 96   Ht 5' (1.524 m)   Wt 155 lb (70.3 kg)   BMI 30.27 kg/m   Review of Systems: See HPI above.    Objective:  Physical Exam:  Gen: NAD, comfortable in exam room  Exam not repeated today. Right shoulder: No swelling, ecchymoses.  No gross deformity. No TTP AC joint or biceps tendon. FROM with painful arc. Positive Hawkins, negative Neers. Negative Yergasons. Strength 5/5 with empty can and resisted internal/external rotation.  Pain empty can  mildly. Negative apprehension. NV intact distally.  Left shoulder: FROM without pain.    Assessment & Plan:  1. Right shoulder pain - 2/2 rotator cuff impingement.  Only mild improvement since last visit with home exercises, nitro patches.  Subacromial injection given today.  Will also consider physical therapy.    After informed written consent, patient was seated on exam table. Right shoulder was prepped with alcohol swab and utilizing posterior approach, patient's right subacromial space was injected with 3:1 bupivicaine: depomedrol. Patient tolerated the procedure well without immediate complications.

## 2017-01-07 ENCOUNTER — Ambulatory Visit (INDEPENDENT_AMBULATORY_CARE_PROVIDER_SITE_OTHER): Payer: Medicare HMO | Admitting: Family Medicine

## 2017-01-07 ENCOUNTER — Encounter: Payer: Self-pay | Admitting: Family Medicine

## 2017-01-07 VITALS — BP 122/75 | HR 81 | Temp 98.2°F | Ht 60.0 in | Wt 156.2 lb

## 2017-01-07 DIAGNOSIS — R05 Cough: Secondary | ICD-10-CM

## 2017-01-07 DIAGNOSIS — R059 Cough, unspecified: Secondary | ICD-10-CM

## 2017-01-07 DIAGNOSIS — R21 Rash and other nonspecific skin eruption: Secondary | ICD-10-CM

## 2017-01-07 DIAGNOSIS — J029 Acute pharyngitis, unspecified: Secondary | ICD-10-CM | POA: Diagnosis not present

## 2017-01-07 LAB — POCT RAPID STREP A (OFFICE): Rapid Strep A Screen: NEGATIVE

## 2017-01-07 MED ORDER — BENZONATATE 100 MG PO CAPS: 100.0000 mg | ORAL_CAPSULE | Freq: Three times a day (TID) | ORAL | 0 refills | Status: DC | PRN

## 2017-01-07 MED ORDER — CLOTRIMAZOLE-BETAMETHASONE 1-0.05 % EX CREA: 1.0000 "application " | TOPICAL_CREAM | Freq: Two times a day (BID) | CUTANEOUS | 1 refills | Status: DC

## 2017-01-07 NOTE — Progress Notes (Signed)
[place] at [facility] [address], [place], [place] [address] [phone]  Date:  01/07/2017   Name:  Cindy Velasquez   DOB:  [birth year]   MRN:  [id number]  PCP:  Ann Held, DO    Chief Complaint: Cough (c/o prod cough with yellow mucus and runny nose x Sunday. Pt states that she had fever Sunday-Tuesday but the fever broke last night. )   History of Present Illness:  Cindy Velasquez is a 76 y.o. very pleasant female patient who presents with the following:  Pt of Dr. Etter Sjogren with history of HTN, hyperlipidemia here today for a sick visit. She first noted sx on Sunday- today is Wednesday.  She noted fever, chills, sore throat, vomited x1 and had diarrhea x2 on Sunday, cough, runny nose.  She has noted some body aches but nothing severe Fever to a little over 100 She has tried some cold medication for pts with hypertension.   She feels like she is getting better now however- last night "my fever broke, I felt silly coming in today but wanted to make sure I was ok."   No fever reducer in the last 12 hours Former smoker No rash Still has a ST but it is improved  She also needs a refill of a cream that she uses at times for a rash in her groin- has this with her today for me to reference   Patient Active Problem List   Diagnosis Date Noted  . Right shoulder pain 08/20/2016  . Knee pain, bilateral 04/23/2015  . Urticaria 10/13/2014  . Neuropathy ([place]) 10/13/2014  . Fatigue 10/13/2014  . Abrasion of face 08/01/2013  . Obesity (BMI 30-39.9) 07/25/2013  . Rib pain on left side 07/25/2013  . Tinea cruris 06/22/2012  . Eustachian tube dysfunction 04/07/2012  . Cerumen impaction 04/07/2012  . URI 10/30/2010  . CHANGE IN BOWELS 04/03/2010  . FECAL OCCULT BLOOD 04/03/2010  . PERSONAL HX COLONIC POLYPS 04/03/2010  . DYSPEPSIA 03/04/2010  . GUAIAC POSITIVE STOOL 03/04/2010  . HEMATURIA UNSPECIFIED 06/28/2009  . DYSURIA  06/28/2009  . HAMMER TOE 12/28/2008  . POSTMENOPAUSAL STATUS 12/28/2008  . BACK PAIN, LUMBAR 07/11/2008  . BENIGN NEOPLASM OF SKIN SITE UNSPECIFIED 02/28/2008  . Hyperlipidemia 04/15/2007  . Essential hypertension 04/15/2007  . DIVERTICULOSIS, COLON 04/15/2007  . THUMB PAIN, RIGHT 04/15/2007  . OSTEOPOROSIS 04/15/2007    Past Medical History:  Diagnosis Date  . Adenomatous colon polyp   . Cancer ([place]) 03/2015   BCC R tibia   . Diverticulosis of colon   . GERD (gastroesophageal reflux disease)   . Hyperlipidemia   . Hyperplastic colon polyp   . Hypertension   . Osteoporosis     Past Surgical History:  Procedure Laterality Date  . EYE SURGERY  2012   B/L 01/2011  02/2011  . HAMMER TOE SURGERY     Bilateral  . ROTATOR CUFF REPAIR     Left  . TUBAL LIGATION      Social History  Substance Use Topics  . Smoking status: Former Smoker    Packs/day: 0.50    Years: 30.00    Types: Cigarettes    Quit date: 06/02/1991  . Smokeless tobacco: Never Used  . Alcohol use 3.0 oz/week    5 Glasses of wine per week    Family History  Problem Relation Age of Onset  . Hypertension Mother   . Cancer  Mother 24    ?  Marland Kitchen Hypertension Father   . Stroke Father   . Hypertension Brother   . Cancer Brother 35    testicular   . Hypertension Brother   . Heart disease Brother     quadruple bypass    No Known Allergies  Medication list has been reviewed and updated.  Current Outpatient Prescriptions on File Prior to Visit  Medication Sig Dispense Refill  . fluticasone (FLONASE) 50 MCG/ACT nasal spray Place 2 sprays into both nostrils daily. 16 g 1  . hydrochlorothiazide (HYDRODIURIL) 25 MG tablet Take 0.5 tablets (12.5 mg total) by mouth every morning. 45 tablet 3  . lisinopril (PRINIVIL,ZESTRIL) 20 MG tablet Take 1 tablet (20 mg total) by mouth daily. 90 tablet 3  . Multiple Vitamins-Minerals (ICAPS AREDS 2) CAPS Take 1 capsule by mouth daily.    . Probiotic Product (PROBIOTIC &  ACIDOPHILUS EX ST PO) Take by mouth.    . rosuvastatin (CRESTOR) 20 MG tablet take 1 tablet by mouth once daily 30 tablet 5   No current facility-administered medications on file prior to visit.     Review of Systems:  As per HPI- otherwise negative.   Physical Examination: Vitals:   01/07/17 1306  BP: 122/75  Pulse: 81  Temp: 98.2 F (36.8 C)   Vitals:   01/07/17 1306  Weight: 156 lb 3.2 oz (70.9 kg)  Height: 5' (1.524 m)   Body mass index is 30.51 kg/m. Ideal Body Weight: Weight in (lb) to have BMI = 25: 127.7  GEN: WDWN, NAD, Non-toxic, A & O x 3, overweight, looks well HEENT: Atraumatic, Normocephalic. Neck supple. No masses, No LAD.  Bilateral TM wnl, oropharynx injected but no exudate.  PEERL,EOMI.   Ears and Nose: No external deformity. CV: RRR, No M/G/R. No JVD. No thrill. No extra heart sounds. PULM: CTA B, no wheezes, crackles, rhonchi. No retractions. No resp. distress. No accessory muscle use. ABD: S, NT, ND EXTR: No c/c/e NEURO Normal gait.  PSYCH: Normally interactive. Conversant. Not depressed or anxious appearing.  Calm demeanor.   Results for orders placed or performed in visit on 01/07/17  POCT rapid strep A  Result Value Ref Range   Rapid Strep A Screen Negative Negative    Assessment and Plan: Pharyngitis, unspecified etiology - Plan: POCT rapid strep A  Cough - Plan: benzonatate (TESSALON) 100 MG capsule  Rash - Plan: clotrimazole-betamethasone (LOTRISONE) cream  Here today with illness- she is improving from a viral illness- possible flu.  She is feeling better and would not benefit from tamiflu at this time so did not test her Rapid strep negative Tessalon to use for cough prn Refilled her lotrisone cream She will let me know if not continuing to get better   Signed Lamar Blinks, MD

## 2017-01-07 NOTE — Progress Notes (Signed)
Pre visit review using our clinic review tool, if applicable. No additional management support is needed unless otherwise documented below in the visit note. 

## 2017-01-07 NOTE — Patient Instructions (Signed)
Your strep test is negative- you may have the flu, but since you are getting better there is not much further to do Use the tessalon perles as needed, and I also refilled your cream for you to use for rash when necessary Please let me know if you are not getting better or if you start getting worse Try to rest as much as you can

## 2017-01-22 ENCOUNTER — Ambulatory Visit: Payer: Commercial Managed Care - HMO | Admitting: Family Medicine

## 2017-01-29 ENCOUNTER — Other Ambulatory Visit: Payer: Self-pay | Admitting: Family Medicine

## 2017-01-29 ENCOUNTER — Encounter: Payer: Self-pay | Admitting: Family Medicine

## 2017-01-29 ENCOUNTER — Ambulatory Visit (INDEPENDENT_AMBULATORY_CARE_PROVIDER_SITE_OTHER): Payer: Medicare HMO | Admitting: Family Medicine

## 2017-01-29 DIAGNOSIS — I10 Essential (primary) hypertension: Secondary | ICD-10-CM

## 2017-01-29 DIAGNOSIS — M25511 Pain in right shoulder: Secondary | ICD-10-CM

## 2017-01-29 NOTE — Patient Instructions (Addendum)
Restart the home exercises. Follow up with me only if needed in 4-6 weeks.

## 2017-01-29 NOTE — Progress Notes (Signed)
PCP: Ann Held, DO  Subjective:   HPI: Patient is a 76 y.o. female here for right shoulder pain.  9/11: Patient reports she woke up one morning 3 months ago with pain lateral right upper arm/shoulder. Pain is 1/10 at rest, up to 7/10 and sharp at worst with reaching and overhead motions. Recalls in July carrying some things and she jammed into right shoulder but was hurting prior to this. Has been icing, tried arnica. Pain reaching behind back. No skin changes, numbness.  10/25: Patient reports she is 40% improved from last visit. Doing home exercises. Not taking any medicines. Pain lateral upper arm. Worse reaching behind her back. Pain level 4/10, sharp. Worse lying on right side also. No skin changes, numbness.  12/19: Patient reports she has improved since last visit. Pain level 3/10, sharp. Worse with lifting coffee pot, reaching behind back, and out to side. No swelling. Motion has improved actively. No skin changes, numbness.  12/11/16: Patient returns for cortisone injection.  2/22: Patient reports she feels about 80% improved. Pain level 0/10 currently but up to [id number] with certain movements, reaching up and behind back. Has been resting past few weeks because of illness (flu). Doing home exercises some. Using nitro without headache or other side effects. No night pain. No skin changes, numbness.  Past Medical History:  Diagnosis Date  . Adenomatous colon polyp   . Cancer ([place]) 03/2015   BCC R tibia   . Diverticulosis of colon   . GERD (gastroesophageal reflux disease)   . Hyperlipidemia   . Hyperplastic colon polyp   . Hypertension   . Osteoporosis     Current Outpatient Prescriptions on File Prior to Visit  Medication Sig Dispense Refill  . benzonatate (TESSALON) 100 MG capsule Take 1 capsule (100 mg total) by mouth 3 (three) times daily as needed for cough. 60 capsule 0  . clotrimazole-betamethasone (LOTRISONE) cream Apply 1 application  topically 2 (two) times daily. Use as needed for rash 30 g 1  . fluticasone (FLONASE) 50 MCG/ACT nasal spray Place 2 sprays into both nostrils daily. 16 g 1  . hydrochlorothiazide (HYDRODIURIL) 25 MG tablet Take 0.5 tablets (12.5 mg total) by mouth every morning. 45 tablet 3  . Multiple Vitamins-Minerals (ICAPS AREDS 2) CAPS Take 1 capsule by mouth daily.    . Probiotic Product (PROBIOTIC & ACIDOPHILUS EX ST PO) Take by mouth.    . rosuvastatin (CRESTOR) 20 MG tablet take 1 tablet by mouth once daily 30 tablet 5   No current facility-administered medications on file prior to visit.     Past Surgical History:  Procedure Laterality Date  . EYE SURGERY  2012   B/L 01/2011  02/2011  . HAMMER TOE SURGERY     Bilateral  . ROTATOR CUFF REPAIR     Left  . TUBAL LIGATION      No Known Allergies  Social History   Social History  . Marital status: Married    Spouse name: N/A  . Number of children: N/A  . Years of education: N/A   Occupational History  . 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 Smoking status: Former Smoker    Packs/day: 0.50    Years: 30.00    Types: Cigarettes    Quit date: 06/02/1991  . Smokeless tobacco: Never Used  . Alcohol use 3.0 oz/week    5 Glasses of wine per week  . Drug use: No  . Sexual activity:  Not Currently    Partners: Male   Other Topics Concern  . Not on file   Social History Narrative   Exercise--  [employer]--yoga and cardio 3x a week    Family History  Problem Relation Age of Onset  . Hypertension Mother   . Cancer Mother 44    ?  Marland Kitchen Hypertension Father   . Stroke Father   . Hypertension Brother   . Cancer Brother 35    testicular   . Hypertension Brother   . Heart disease Brother     quadruple bypass    BP (!) 148/81   Pulse 82   Ht 5' (1.524 m)   Wt 150 lb (68 kg)   BMI 29.29 kg/m   Review of Systems: See HPI above.    Objective:  Physical Exam:  Gen: NAD, comfortable in exam  room  Right shoulder: No swelling, ecchymoses.  No gross deformity. No TTP AC joint or biceps tendon. FROM with painful arc. Positive Hawkins, negative Neers. Negative Yergasons. Strength 5/5 with empty can and resisted internal/external rotation.  Pain empty can mildly. Negative apprehension. NV intact distally.  Left shoulder: FROM without pain.    Assessment & Plan:  1. Right shoulder pain - 2/2 rotator cuff impingement.  Clinically improving following injection.  She will do home exercises, nitro patches for another 6 weeks.  If still doing well will follow up as needed.

## 2017-02-02 NOTE — Assessment & Plan Note (Signed)
2/2 rotator cuff impingement.  Clinically improving following injection.  She will do home exercises, nitro patches for another 6 weeks.  If still doing well will follow up as needed.

## 2017-03-06 ENCOUNTER — Other Ambulatory Visit: Payer: Self-pay | Admitting: Family Medicine

## 2017-04-19 ENCOUNTER — Other Ambulatory Visit: Payer: Self-pay | Admitting: Family Medicine

## 2017-05-29 DIAGNOSIS — Z1231 Encounter for screening mammogram for malignant neoplasm of breast: Secondary | ICD-10-CM | POA: Diagnosis not present

## 2017-05-29 LAB — HM MAMMOGRAPHY

## 2017-06-09 ENCOUNTER — Encounter: Payer: Self-pay | Admitting: *Deleted

## 2017-06-16 DIAGNOSIS — H10413 Chronic giant papillary conjunctivitis, bilateral: Secondary | ICD-10-CM | POA: Diagnosis not present

## 2017-06-16 DIAGNOSIS — H04223 Epiphora due to insufficient drainage, bilateral lacrimal glands: Secondary | ICD-10-CM | POA: Diagnosis not present

## 2017-06-16 DIAGNOSIS — Z961 Presence of intraocular lens: Secondary | ICD-10-CM | POA: Diagnosis not present

## 2017-06-16 DIAGNOSIS — H353132 Nonexudative age-related macular degeneration, bilateral, intermediate dry stage: Secondary | ICD-10-CM | POA: Diagnosis not present

## 2017-07-02 ENCOUNTER — Ambulatory Visit (INDEPENDENT_AMBULATORY_CARE_PROVIDER_SITE_OTHER): Payer: Medicare HMO | Admitting: Family Medicine

## 2017-07-02 ENCOUNTER — Encounter: Payer: Self-pay | Admitting: Family Medicine

## 2017-07-02 VITALS — BP 128/80 | HR 77 | Temp 98.1°F | Resp 16 | Ht 60.0 in | Wt 162.0 lb

## 2017-07-02 DIAGNOSIS — I10 Essential (primary) hypertension: Secondary | ICD-10-CM

## 2017-07-02 DIAGNOSIS — E785 Hyperlipidemia, unspecified: Secondary | ICD-10-CM

## 2017-07-02 DIAGNOSIS — R002 Palpitations: Secondary | ICD-10-CM

## 2017-07-02 LAB — LIPID PANEL
CHOL/HDL RATIO: 3
Cholesterol: 127 mg/dL (ref 0–200)
HDL: 38.7 mg/dL — ABNORMAL LOW (ref >39.00)
LDL CALC: 63 mg/dL (ref 0–99)
NONHDL: 88.12
Triglycerides: 127 mg/dL (ref 0.0–149.0)
VLDL: 25.4 mg/dL (ref 0.0–40.0)

## 2017-07-02 LAB — COMPREHENSIVE METABOLIC PANEL
ALBUMIN: 3.9 g/dL (ref 3.5–5.2)
ALK PHOS: 65 U/L (ref 39–117)
ALT: 30 U/L (ref 0–35)
AST: 26 U/L (ref 0–37)
BILIRUBIN TOTAL: 0.6 mg/dL (ref 0.2–1.2)
BUN: 14 mg/dL (ref 6–23)
CALCIUM: 9.6 mg/dL (ref 8.4–10.5)
CO2: 30 mEq/L (ref 19–32)
Chloride: 104 mEq/L (ref 96–112)
Creatinine, Ser: 0.7 mg/dL (ref 0.40–1.20)
GFR: 86.49 mL/min (ref >60.00)
Glucose, Bld: 95 mg/dL (ref 70–99)
Potassium: 4 mEq/L (ref 3.5–5.1)
Sodium: 141 mEq/L (ref 135–145)
TOTAL PROTEIN: 7.6 g/dL (ref 6.0–8.3)

## 2017-07-02 LAB — TSH: TSH: 1.71 u[IU]/mL (ref 0.35–4.50)

## 2017-07-02 NOTE — Patient Instructions (Signed)
Palpitations A palpitation is the feeling that your heartbeat is irregular or is faster than normal. It may feel like your heart is fluttering or skipping a beat. Palpitations are usually not a serious problem. They may be caused by many things, including smoking, caffeine, alcohol, stress, and certain medicines. Although most causes of palpitations are not serious, palpitations can be a sign of a serious medical problem. In some cases, you may need further medical evaluation. Follow these instructions at home: Pay attention to any changes in your symptoms. Take these actions to help with your condition:  Avoid the following: ? Caffeinated coffee, tea, soft drinks, diet pills, and energy drinks. ? Chocolate. ? Alcohol.  Do not use any tobacco products, such as cigarettes, chewing tobacco, and e-cigarettes. If you need help quitting, ask your health care provider.  Try to reduce your stress and anxiety. Things that can help you relax include: ? Yoga. ? Meditation. ? Physical activity, such as swimming, jogging, or walking. ? Biofeedback. This is a method that helps you learn to use your mind to control things in your body, such as your heartbeats.  Get plenty of rest and sleep.  Take over-the-counter and prescription medicines only as told by your health care provider.  Keep all follow-up visits as told by your health care provider. This is important.  Contact a health care provider if:  You continue to have a fast or irregular heartbeat after 24 hours.  Your palpitations occur more often. Get help right away if:  You have chest pain or shortness of breath.  You have a severe headache.  You feel dizzy or you faint. This information is not intended to replace advice given to you by your health care provider. Make sure you discuss any questions you have with your health care provider. Document Released: 11/21/2000 Document Revised: 04/28/2016 Document Reviewed: 08/09/2015 Elsevier  Interactive Patient Education  2017 Elsevier Inc.  

## 2017-07-02 NOTE — Progress Notes (Signed)
Patient ID: Cindy Velasquez, female   DOB: [birth year], 76 y.o.   MRN: [id number]     Subjective:  I acted as a Education administrator for Dr. Carollee Herter.  Guerry Bruin, [place]   Patient ID: Cindy Velasquez, female    DOB: 12-03-1941, 76 y.o.   MRN: [id number]  Chief Complaint  Patient presents with  . Hyperlipidemia  . Hypertension    HPI  Patient is in today for follow up blood pressure and cholesterol.  She has been doing well on current treatment for cholesterol with no side effects. She is c/o about occassional racing heart beat for about 60 sec.  She gets a little sob with it.  No chest pain.   This occurs about 1x a week.  Last episode was a few days ago.    Patient Care Team: Carollee Herter, Alferd Apa, DO as PCP - General Clent Jacks, MD as Consulting Physician (Ophthalmology)   Past Medical History:  Diagnosis Date  . Adenomatous colon polyp   . Cancer ([place]) 03/2015   BCC R tibia   . Diverticulosis of colon   . GERD (gastroesophageal reflux disease)   . Hyperlipidemia   . Hyperplastic colon polyp   . Hypertension   . Osteoporosis     Past Surgical History:  Procedure Laterality Date  . EYE SURGERY  2012   B/L 01/2011  02/2011  . HAMMER TOE SURGERY     Bilateral  . ROTATOR CUFF REPAIR     Left  . TUBAL LIGATION      Family History  Problem Relation Age of Onset  . Hypertension Mother   . Cancer Mother 8       ?  Marland Kitchen Hypertension Father   . Stroke Father   . Hypertension Brother   . Cancer Brother 35       testicular   . Hypertension Brother   . Heart disease Brother        quadruple bypass    Social History   Social History  . Marital status: Married    Spouse name: N/A  . Number of children: N/A  . Years of education: N/A   Occupational History  . 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 Smoking status: Former Smoker    Packs/day: 0.50    Years: 30.00    Types: Cigarettes    Quit date: 06/02/1991  . Smokeless tobacco: Never Used    . Alcohol use 3.0 oz/week    5 Glasses of wine per week  . Drug use: No  . Sexual activity: Not Currently    Partners: Male   Other Topics Concern  . Not on file   Social History Narrative   Exercise--  [employer]--yoga and cardio 3x a week    Outpatient Medications Prior to Visit  Medication Sig Dispense Refill  . benzonatate (TESSALON) 100 MG capsule Take 1 capsule (100 mg total) by mouth 3 (three) times daily as needed for cough. 60 capsule 0  . clotrimazole-betamethasone (LOTRISONE) cream Apply 1 application topically 2 (two) times daily. Use as needed for rash 30 g 1  . fluticasone (FLONASE) 50 MCG/ACT nasal spray Place 2 sprays into both nostrils daily. 16 g 1  . hydrochlorothiazide (HYDRODIURIL) 25 MG tablet take 1/2 tablet by mouth every morning 45 tablet 3  . lisinopril (PRINIVIL,ZESTRIL) 20 MG tablet take 1 tablet by mouth once daily 90 tablet 3  . Multiple Vitamins-Minerals (ICAPS AREDS 2) CAPS Take 1 capsule by  mouth daily.    . Probiotic Product (PROBIOTIC & ACIDOPHILUS EX ST PO) Take by mouth.    . rosuvastatin (CRESTOR) 20 MG tablet take 1 tablet by mouth once daily 30 tablet 5  . hydrochlorothiazide (HYDRODIURIL) 25 MG tablet Take 0.5 tablets (12.5 mg total) by mouth every morning. 45 tablet 3   No facility-administered medications prior to visit.     No Known Allergies  Review of Systems  Constitutional: Negative for fever and malaise/fatigue.  HENT: Negative for congestion.   Eyes: Negative for blurred vision.  Respiratory: Negative for shortness of breath.   Cardiovascular: Negative for chest pain, palpitations and leg swelling.  Gastrointestinal: Negative for abdominal pain, blood in stool and nausea.  Genitourinary: Negative for dysuria and frequency.  Musculoskeletal: Negative for falls.  Skin: Negative for rash.  Neurological: Negative for dizziness, loss of consciousness and headaches.  Endo/Heme/Allergies: Negative for environmental allergies.   Psychiatric/Behavioral: Negative for depression. The patient is not nervous/anxious.        Objective:    Physical Exam  Constitutional: She is oriented to person, place, and time. She appears well-developed and well-nourished.  HENT:  Head: Normocephalic and atraumatic.  Eyes: Conjunctivae and EOM are normal.  Neck: Normal range of motion. Neck supple. No JVD present. Carotid bruit is not present. No thyromegaly present.  Cardiovascular: Normal rate, regular rhythm and normal heart sounds.   No murmur heard. Pulmonary/Chest: Effort normal and breath sounds normal. No respiratory distress. She has no wheezes. She has no rales. She exhibits no tenderness.  Musculoskeletal: She exhibits no edema.  Neurological: She is alert and oriented to person, place, and time.  Psychiatric: She has a normal mood and affect.  Nursing note and vitals reviewed.   BP 128/80 (BP Location: Left Arm, Cuff Size: Normal)   Pulse 77   Temp 98.1 F (36.7 C) (Oral)   Resp 16   Ht 5' (1.524 m)   Wt 162 lb (73.5 kg)   SpO2 95%   BMI 31.64 kg/m  Wt Readings from Last 3 Encounters:  07/02/17 162 lb (73.5 kg)  01/29/17 150 lb (68 kg)  01/07/17 156 lb 3.2 oz (70.9 kg)   BP Readings from Last 3 Encounters:  07/02/17 128/80  01/29/17 (!) 148/81  01/07/17 122/75     Immunization History  Administered Date(s) Administered  . H1N1 12/28/2008  . Influenza Whole 10/20/2007, 10/18/2008, 11/15/2012  . Influenza-Unspecified 12/20/2013, 11/16/2016  . Pneumococcal Conjugate-13 04/23/2015  . Pneumococcal Polysaccharide-23 12/28/2008  . Tdap 08/01/2013    Health Maintenance  Topic Date Due  . INFLUENZA VACCINE  07/08/2017  . MAMMOGRAM  05/29/2018  . COLONOSCOPY  05/07/2020  . TETANUS/TDAP  08/02/2023  . DEXA SCAN  Completed  . PNA vac Low Risk Adult  Completed    Lab Results  Component Value Date   WBC 6.7 10/13/2014   HGB 14.4 10/13/2014   HCT 44.4 10/13/2014   PLT 262 10/13/2014   GLUCOSE 95  07/02/2017   CHOL 127 07/02/2017   TRIG 127.0 07/02/2017   HDL 38.70 (L) 07/02/2017   LDLDIRECT 129.1 08/23/2007   LDLCALC 63 07/02/2017   ALT 30 07/02/2017   AST 26 07/02/2017   NA 141 07/02/2017   K 4.0 07/02/2017   CL 104 07/02/2017   CREATININE 0.70 07/02/2017   BUN 14 07/02/2017   CO2 30 07/02/2017   TSH 1.71 07/02/2017    Lab Results  Component Value Date   TSH 1.71 07/02/2017   Lab  Results  Component Value Date   WBC 6.7 10/13/2014   HGB 14.4 10/13/2014   HCT 44.4 10/13/2014   MCV 91.2 10/13/2014   PLT 262 10/13/2014   Lab Results  Component Value Date   NA 141 07/02/2017   K 4.0 07/02/2017   CO2 30 07/02/2017   GLUCOSE 95 07/02/2017   BUN 14 07/02/2017   CREATININE 0.70 07/02/2017   BILITOT 0.6 07/02/2017   ALKPHOS 65 07/02/2017   AST 26 07/02/2017   ALT 30 07/02/2017   PROT 7.6 07/02/2017   ALBUMIN 3.9 07/02/2017   CALCIUM 9.6 07/02/2017   GFR 86.49 07/02/2017   Lab Results  Component Value Date   CHOL 127 07/02/2017   Lab Results  Component Value Date   HDL 38.70 (L) 07/02/2017   Lab Results  Component Value Date   LDLCALC 63 07/02/2017   Lab Results  Component Value Date   TRIG 127.0 07/02/2017   Lab Results  Component Value Date   CHOLHDL 3 07/02/2017   No results found for: HGBA1C       Assessment & Plan:   Problem List Items Addressed This Visit      Unprioritized   Essential hypertension    Well controlled, no changes to meds. Encouraged heart healthy diet such as the DASH diet and exercise as tolerated.       Relevant Orders   Comprehensive metabolic panel (Completed)   Lipid panel (Completed)   ECHOCARDIOGRAM COMPLETE   Hyperlipidemia - Primary    Tolerating statin, encouraged heart healthy diet, avoid trans fats, minimize simple carbs and saturated fats. Increase exercise as tolerated      Relevant Orders   Comprehensive metabolic panel (Completed)   Lipid panel (Completed)    Other Visit Diagnoses     Palpitations       Relevant Orders   EKG 12-Lead (Completed)   ECHOCARDIOGRAM COMPLETE   Cardiac event monitor   TSH (Completed)      I am having Ms. Koeppen maintain her ICAPS AREDS 2, Probiotic Product (PROBIOTIC & ACIDOPHILUS EX ST PO), fluticasone, clotrimazole-betamethasone, benzonatate, lisinopril, hydrochlorothiazide, and rosuvastatin.  No orders of the defined types were placed in this encounter.   Ann Held, DO

## 2017-07-04 NOTE — Assessment & Plan Note (Signed)
Tolerating statin, encouraged heart healthy diet, avoid trans fats, minimize simple carbs and saturated fats. Increase exercise as tolerated 

## 2017-07-04 NOTE — Assessment & Plan Note (Signed)
Well controlled, no changes to meds. Encouraged heart healthy diet such as the DASH diet and exercise as tolerated.  °

## 2017-07-16 ENCOUNTER — Ambulatory Visit (HOSPITAL_BASED_OUTPATIENT_CLINIC_OR_DEPARTMENT_OTHER)
Admission: RE | Admit: 2017-07-16 | Discharge: 2017-07-16 | Disposition: A | Discharge status: Home / Self Care | Payer: Medicare HMO | Source: Ambulatory Visit | Attending: Family Medicine | Admitting: Family Medicine

## 2017-07-16 DIAGNOSIS — R002 Palpitations: Secondary | ICD-10-CM | POA: Diagnosis not present

## 2017-07-16 DIAGNOSIS — I082 Rheumatic disorders of both aortic and tricuspid valves: Secondary | ICD-10-CM | POA: Insufficient documentation

## 2017-07-16 DIAGNOSIS — I10 Essential (primary) hypertension: Secondary | ICD-10-CM | POA: Insufficient documentation

## 2017-07-16 LAB — ECHOCARDIOGRAM COMPLETE
CHL CUP RV SYS PRESS: 30 mmHg
EERAT: 15.28
EWDT: 141 ms
FS: 29 % (ref 28–44)
IVS/LV PW RATIO, ED: 1
LA ID, A-P, ES: 38 mm
LA vol index: 26.8 mL/m2
LA vol: 45.8 mL
LADIAMINDEX: 2.22 cm/m2
LAVOLA4C: 49.6 mL
LEFT ATRIUM END SYS DIAM: 38 mm
LV TDI E'LATERAL: 5.87
LV TDI E'MEDIAL: 6.64
LVEEAVG: 15.28
LVEEMED: 15.28
LVELAT: 5.87 cm/s
LVOT SV: 49 mL
LVOT VTI: 19.4 cm
LVOT area: 2.54 cm2
LVOT diameter: 18 mm
LVOTPV: 86.7 cm/s
MV Dec: 141
MV Peak grad: 3 mmHg
MVPKAVEL: 88.1 m/s
MVPKEVEL: 89.7 m/s
PW: 10.7 mm — AB (ref 0.6–1.1)
RV LATERAL S' VELOCITY: 16.5 cm/s
Reg peak vel: 262 cm/s
TAPSE: 21.5 mm
TRMAXVEL: 262 cm/s

## 2017-07-16 NOTE — Progress Notes (Signed)
  Echocardiogram 2D Echocardiogram has been performed.  Cindy Velasquez 07/16/2017, 11:04 AM

## 2017-07-20 ENCOUNTER — Telehealth: Payer: Self-pay | Admitting: Family Medicine

## 2017-07-20 NOTE — Telephone Encounter (Signed)
Caller name: Relationship to patient: Can be reached: Pharmacy:  Reason for call: Pt called about holter monitor. She is asking how long she is supposed to wear it. Pt is going on vacation 08/08/17 for 2 weeks. She is going to the beach and is assuming she cannot wear monitor on the beach. Pt requesting call back as she is supposed to get it tomorrow 07/21/17.

## 2017-07-20 NOTE — Telephone Encounter (Signed)
Usually its for 1 month

## 2017-07-20 NOTE — Telephone Encounter (Signed)
Patient wanted to know how many days she would be wearing th monitor?  She has not got the monitor yet.  Would it be for 2 days, 3 days, etc.  Her appt is tomorrow and she wanted to know if she should cancel and reschedule.  She will be going on vacation 08/08/17.    I am not sure how the monitor works and how long

## 2017-07-21 ENCOUNTER — Ambulatory Visit (INDEPENDENT_AMBULATORY_CARE_PROVIDER_SITE_OTHER): Payer: Medicare HMO

## 2017-07-21 DIAGNOSIS — R002 Palpitations: Secondary | ICD-10-CM

## 2017-07-21 NOTE — Telephone Encounter (Signed)
Patient went to appt this morning and was advised that she would have to wear it for about 30 days.

## 2017-08-31 NOTE — Progress Notes (Signed)
Subjective:   Cindy Velasquez is a 76 y.o. female who presents for Medicare Annual (Subsequent) preventive examination.  Review of Systems:  No ROS.  Medicare Wellness Visit. Additional risk factors are reflected in the social history.  Cardiac Risk Factors include: advanced age (>40men, >26 women);diabetes mellitus;dyslipidemia Sleep patterns: Restless sleep. Home Safety/Smoke Alarms: Feels safe in home. Smoke alarms in place.  Living environment; residence and Firearm Safety: Lives with husband.  Seat Belt Safety/Bike Helmet: Wears seat belt.   Female:        Mammo- last 05/29/17       Dexa scan-   Ordered today.     CCS- last 2011. Recall 5 yrs. Pt declines today.     Objective:     Vitals: BP (!) 147/64 (BP Location: Left Arm, Cuff Size: Normal)   Pulse 74   Ht 5' (1.524 m)   Wt 162 lb 9.6 oz (73.8 kg)   SpO2 97%   BMI 31.76 kg/m   Body mass index is 31.76 kg/m.   Tobacco History  Smoking Status  . Former Smoker  . Packs/day: 0.50  . Years: 30.00  . Types: Cigarettes  . Quit date: 06/02/1991  Smokeless Tobacco  . Never Used     Counseling given: Not Answered   Past Medical History:  Diagnosis Date  . Adenomatous colon polyp   . Cancer ([place]) 03/2015   BCC R tibia   . Diverticulosis of colon   . GERD (gastroesophageal reflux disease)   . Hyperlipidemia   . Hyperplastic colon polyp   . Hypertension   . Osteoporosis    Past Surgical History:  Procedure Laterality Date  . EYE SURGERY  2012   B/L 01/2011  02/2011  . HAMMER TOE SURGERY     Bilateral  . ROTATOR CUFF REPAIR     Left  . TUBAL LIGATION     Family History  Problem Relation Age of Onset  . Hypertension Mother   . Cancer Mother 50       ?  Marland Kitchen Hypertension Father   . Stroke Father   . Hypertension Brother   . Cancer Brother 35       testicular   . Hypertension Brother   . Heart disease Brother        quadruple bypass   History  Sexual Activity  . Sexual activity: Not Currently  .  Partners: Male    Outpatient Encounter Prescriptions as of 09/03/2017  Medication Sig  . clotrimazole-betamethasone (LOTRISONE) cream Apply 1 application topically 2 (two) times daily. Use as needed for rash  . hydrochlorothiazide (HYDRODIURIL) 25 MG tablet take 1/2 tablet by mouth every morning  . lisinopril (PRINIVIL,ZESTRIL) 20 MG tablet take 1 tablet by mouth once daily  . Multiple Vitamins-Minerals (ICAPS AREDS 2) CAPS Take 2 capsules by mouth daily.   . Probiotic Product (PROBIOTIC & ACIDOPHILUS EX ST PO) Take by mouth.  . rosuvastatin (CRESTOR) 20 MG tablet take 1 tablet by mouth once daily  . benzonatate (TESSALON) 100 MG capsule Take 1 capsule (100 mg total) by mouth 3 (three) times daily as needed for cough. (Patient not taking: Reported on 09/03/2017)  . fluticasone (FLONASE) 50 MCG/ACT nasal spray Place 2 sprays into both nostrils daily. (Patient not taking: Reported on 09/03/2017)   No facility-administered encounter medications on file as of 09/03/2017.     Activities of Daily Living In your present state of health, do you have any difficulty performing the following activities: 09/03/2017  Hearing? N  Vision? N  Comment wears glasses. Dr.Groat every 6 months.  Difficulty concentrating or making decisions? N  Walking or climbing stairs? N  Dressing or bathing? N  Doing errands, shopping? N  Preparing Food and eating ? N  Using the Toilet? N  In the past six months, have you accidently leaked urine? Y  Comment wears pad  Do you have problems with loss of bowel control? N  Managing your Medications? N  Managing your Finances? N  Housekeeping or managing your Housekeeping? N  Some recent data might be hidden    Patient Care Team: Carollee Herter, Alferd Apa, DO as PCP - General Clent Jacks, MD as Consulting Physician (Ophthalmology)    Assessment:    Physical assessment deferred to PCP.  Exercise Activities and Dietary recommendations Current Exercise Habits:  Structured exercise class, Time (Minutes): 60, Frequency (Times/Week): 4, Weekly Exercise (Minutes/Week): 240, Intensity: Mild   Diet (meal preparation, eat out, water intake, caffeinated beverages, dairy products, fruits and vegetables): well balanced   Goals    . Increase physical activity    . Increase water intake      Fall Risk Fall Risk  09/03/2017 08/05/2016 02/07/2016 11/27/2014 08/01/2013  Falls in the past year? No No No No No   Depression Screen PHQ 2/9 Scores 09/03/2017 08/05/2016 02/07/2016 11/27/2014  PHQ - 2 Score 0 1 0 0     Cognitive Function Ad8 score reviewed for issues:  Issues making decisions:no  Less interest in hobbies / activities:no  Repeats questions, stories (family complaining):no  Trouble using ordinary gadgets (microwave, computer, phone):no  Forgets the month or year: no  Mismanaging finances: no  Remembering appts:no  Daily problems with thinking and/or memory:no Ad8 score is=0   MMSE - Mini Mental State Exam 08/05/2016  Orientation to time 5  Orientation to Place 5  Registration 3  Attention/ Calculation 5  Recall 3  Language- name 2 objects 2  Language- repeat 1  Language- follow 3 step command 3  Language- read & follow direction 1  Write a sentence 1  Copy design 0  Total score 29        Immunization History  Administered Date(s) Administered  . H1N1 12/28/2008  . Influenza Whole 10/20/2007, 10/18/2008, 11/15/2012  . Influenza-Unspecified 12/20/2013, 11/16/2016  . Pneumococcal Conjugate-13 04/23/2015  . Pneumococcal Polysaccharide-23 12/28/2008  . Tdap 08/01/2013   Screening Tests Health Maintenance  Topic Date Due  . INFLUENZA VACCINE  10/03/2017 (Originally 07/08/2017)  . MAMMOGRAM  05/29/2018  . TETANUS/TDAP  08/02/2023  . DEXA SCAN  Completed  . PNA vac Low Risk Adult  Completed      Plan:   Follow up with PCP as directed.  Continue to eat heart healthy diet (full of fruits, vegetables, whole grains, lean  protein, water--limit salt, fat, and sugar intake) and increase physical activity as tolerated.  Continue doing brain stimulating activities (puzzles, reading, adult coloring books, staying active) to keep memory sharp.   Bring a copy of your living will and/or healthcare power of attorney to your next office visit.  Consider Melatonin to help you sleep.   I have personally reviewed and noted the following in the patient's chart:   . Medical and social history . Use of alcohol, tobacco or illicit drugs  . Current medications and supplements . Functional ability and status . Nutritional status . Physical activity . Advanced directives . List of other physicians . Hospitalizations, surgeries, and ER visits in previous 12 months .  Vitals . Screenings to include cognitive, depression, and falls . Referrals and appointments  In addition, I have reviewed and discussed with patient certain preventive protocols, quality metrics, and best practice recommendations. A written personalized care plan for preventive services as well as general preventive health recommendations were provided to patient.     Naaman Plummer [place], [place]  09/03/2017

## 2017-09-03 ENCOUNTER — Ambulatory Visit (INDEPENDENT_AMBULATORY_CARE_PROVIDER_SITE_OTHER): Payer: Medicare HMO | Admitting: *Deleted

## 2017-09-03 ENCOUNTER — Encounter: Payer: Self-pay | Admitting: *Deleted

## 2017-09-03 VITALS — BP 147/64 | HR 74 | Ht 60.0 in | Wt 162.6 lb

## 2017-09-03 DIAGNOSIS — Z0000 Encounter for general adult medical examination without abnormal findings: Secondary | ICD-10-CM

## 2017-09-03 DIAGNOSIS — Z780 Asymptomatic menopausal state: Secondary | ICD-10-CM

## 2017-09-03 NOTE — Addendum Note (Signed)
Addended by: Naaman Plummer A on: 09/03/2017 11:37 AM   Modules accepted: Orders

## 2017-09-03 NOTE — Patient Instructions (Signed)
Cindy Velasquez , Thank you for taking time to come for your Medicare Wellness Visit. I appreciate your ongoing commitment to your health goals. Please review the following plan we discussed and let me know if I can assist you in the future.   These are the goals we discussed: Goals    . Increase physical activity    . Increase water intake       This is a list of the screening recommended for you and due dates:  Health Maintenance  Topic Date Due  . Flu Shot  10/03/2017*  . Mammogram  05/29/2018  . Tetanus Vaccine  08/02/2023  . DEXA scan (bone density measurement)  Completed  . Pneumonia vaccines  Completed  *Topic was postponed. The date shown is not the original due date.   Continue to eat heart healthy diet (full of fruits, vegetables, whole grains, lean protein, water--limit salt, fat, and sugar intake) and increase physical activity as tolerated.  Continue doing brain stimulating activities (puzzles, reading, adult coloring books, staying active) to keep memory sharp.   Bring a copy of your living will and/or healthcare power of attorney to your next office visit.  Consider Melatonin to help you sleep.    Health Maintenance for Postmenopausal Women Menopause is a normal process in which your reproductive ability comes to an end. This process happens gradually over a span of months to years, usually between the ages of 32 and 73. Menopause is complete when you have missed 12 consecutive menstrual periods. It is important to talk with your health care provider about some of the most common conditions that affect postmenopausal women, such as heart disease, cancer, and bone loss (osteoporosis). Adopting a healthy lifestyle and getting preventive care can help to promote your health and wellness. Those actions can also lower your chances of developing some of these common conditions. What should I know about menopause? During menopause, you may experience a number of symptoms, such  as:  Moderate-to-severe hot flashes.  Night sweats.  Decrease in sex drive.  Mood swings.  Headaches.  Tiredness.  Irritability.  Memory problems.  Insomnia.  Choosing to treat or not to treat menopausal changes is an individual decision that you make with your health care provider. What should I know about hormone replacement therapy and supplements? Hormone therapy products are effective for treating symptoms that are associated with menopause, such as hot flashes and night sweats. Hormone replacement carries certain risks, especially as you become older. If you are thinking about using estrogen or estrogen with progestin treatments, discuss the benefits and risks with your health care provider. What should I know about heart disease and stroke? Heart disease, heart attack, and stroke become more likely as you age. This may be due, in part, to the hormonal changes that your body experiences during menopause. These can affect how your body processes dietary fats, triglycerides, and cholesterol. Heart attack and stroke are both medical emergencies. There are many things that you can do to help prevent heart disease and stroke:  Have your blood pressure checked at least every 1-2 years. High blood pressure causes heart disease and increases the risk of stroke.  If you are 44-40 years old, ask your health care provider if you should take aspirin to prevent a heart attack or a stroke.  Do not use any tobacco products, including cigarettes, chewing tobacco, or electronic cigarettes. If you need help quitting, ask your health care provider.  It is important to eat a healthy  diet and maintain a healthy weight. ? Be sure to include plenty of vegetables, fruits, low-fat dairy products, and lean protein. ? Avoid eating foods that are high in solid fats, added sugars, or salt (sodium).  Get regular exercise. This is one of the most important things that you can do for your health. ? Try  to exercise for at least 150 minutes each week. The type of exercise that you do should increase your heart rate and make you sweat. This is known as moderate-intensity exercise. ? Try to do strengthening exercises at least twice each week. Do these in addition to the moderate-intensity exercise.  Know your numbers.Ask your health care provider to check your cholesterol and your blood glucose. Continue to have your blood tested as directed by your health care provider.  What should I know about cancer screening? There are several types of cancer. Take the following steps to reduce your risk and to catch any cancer development as early as possible. Breast Cancer  Practice breast self-awareness. ? This means understanding how your breasts normally appear and feel. ? It also means doing regular breast self-exams. Let your health care provider know about any changes, no matter how small.  If you are 84 or older, have a clinician do a breast exam (clinical breast exam or CBE) every year. Depending on your age, family history, and medical history, it may be recommended that you also have a yearly breast X-ray (mammogram).  If you have a family history of breast cancer, talk with your health care provider about genetic screening.  If you are at high risk for breast cancer, talk with your health care provider about having an MRI and a mammogram every year.  Breast cancer (BRCA) gene test is recommended for women who have family members with BRCA-related cancers. Results of the assessment will determine the need for genetic counseling and BRCA1 and for BRCA2 testing. BRCA-related cancers include these types: ? Breast. This occurs in males or females. ? Ovarian. ? Tubal. This may also be called fallopian tube cancer. ? Cancer of the abdominal or pelvic lining (peritoneal cancer). ? Prostate. ? Pancreatic.  Cervical, Uterine, and Ovarian Cancer Your health care provider may recommend that you be  screened regularly for cancer of the pelvic organs. These include your ovaries, uterus, and vagina. This screening involves a pelvic exam, which includes checking for microscopic changes to the surface of your cervix (Pap test).  For women ages 21-65, health care providers may recommend a pelvic exam and a Pap test every three years. For women ages 25-65, they may recommend the Pap test and pelvic exam, combined with testing for human papilloma virus (HPV), every five years. Some types of HPV increase your risk of cervical cancer. Testing for HPV may also be done on women of any age who have unclear Pap test results.  Other health care providers may not recommend any screening for nonpregnant women who are considered low risk for pelvic cancer and have no symptoms. Ask your health care provider if a screening pelvic exam is right for you.  If you have had past treatment for cervical cancer or a condition that could lead to cancer, you need Pap tests and screening for cancer for at least 20 years after your treatment. If Pap tests have been discontinued for you, your risk factors (such as having a new sexual partner) need to be reassessed to determine if you should start having screenings again. Some women have medical problems that  increase the chance of getting cervical cancer. In these cases, your health care provider may recommend that you have screening and Pap tests more often.  If you have a family history of uterine cancer or ovarian cancer, talk with your health care provider about genetic screening.  If you have vaginal bleeding after reaching menopause, tell your health care provider.  There are currently no reliable tests available to screen for ovarian cancer.  Lung Cancer Lung cancer screening is recommended for adults 36-21 years old who are at high risk for lung cancer because of a history of smoking. A yearly low-dose CT scan of the lungs is recommended if you:  Currently  smoke.  Have a history of at least 30 pack-years of smoking and you currently smoke or have quit within the past 15 years. A pack-year is smoking an average of one pack of cigarettes per day for one year.  Yearly screening should:  Continue until it has been 15 years since you quit.  Stop if you develop a health problem that would prevent you from having lung cancer treatment.  Colorectal Cancer  This type of cancer can be detected and can often be prevented.  Routine colorectal cancer screening usually begins at age 71 and continues through age 54.  If you have risk factors for colon cancer, your health care provider may recommend that you be screened at an earlier age.  If you have a family history of colorectal cancer, talk with your health care provider about genetic screening.  Your health care provider may also recommend using home test kits to check for hidden blood in your stool.  A small camera at the end of a tube can be used to examine your colon directly (sigmoidoscopy or colonoscopy). This is done to check for the earliest forms of colorectal cancer.  Direct examination of the colon should be repeated every 5-10 years until age 25. However, if early forms of precancerous polyps or small growths are found or if you have a family history or genetic risk for colorectal cancer, you may need to be screened more often.  Skin Cancer  Check your skin from head to toe regularly.  Monitor any moles. Be sure to tell your health care provider: ? About any new moles or changes in moles, especially if there is a change in a mole's shape or color. ? If you have a mole that is larger than the size of a pencil eraser.  If any of your family members has a history of skin cancer, especially at a young age, talk with your health care provider about genetic screening.  Always use sunscreen. Apply sunscreen liberally and repeatedly throughout the day.  Whenever you are outside, protect  yourself by wearing long sleeves, pants, a wide-brimmed hat, and sunglasses.  What should I know about osteoporosis? Osteoporosis is a condition in which bone destruction happens more quickly than new bone creation. After menopause, you may be at an increased risk for osteoporosis. To help prevent osteoporosis or the bone fractures that can happen because of osteoporosis, the following is recommended:  If you are 83-19 years old, get at least 1,000 mg of calcium and at least 600 mg of vitamin D per day.  If you are older than age 85 but younger than age 10, get at least 1,200 mg of calcium and at least 600 mg of vitamin D per day.  If you are older than age 73, get at least 1,200 mg of calcium  and at least 800 mg of vitamin D per day.  Smoking and excessive alcohol intake increase the risk of osteoporosis. Eat foods that are rich in calcium and vitamin D, and do weight-bearing exercises several times each week as directed by your health care provider. What should I know about how menopause affects my mental health? Depression may occur at any age, but it is more common as you become older. Common symptoms of depression include:  Low or sad mood.  Changes in sleep patterns.  Changes in appetite or eating patterns.  Feeling an overall lack of motivation or enjoyment of activities that you previously enjoyed.  Frequent crying spells.  Talk with your health care provider if you think that you are experiencing depression. What should I know about immunizations? It is important that you get and maintain your immunizations. These include:  Tetanus, diphtheria, and pertussis (Tdap) booster vaccine.  Influenza every year before the flu season begins.  Pneumonia vaccine.  Shingles vaccine.  Your health care provider may also recommend other immunizations. This information is not intended to replace advice given to you by your health care provider. Make sure you discuss any questions you  have with your health care provider. Document Released: 01/16/2006 Document Revised: 06/13/2016 Document Reviewed: 08/28/2015 Elsevier Interactive Patient Education  2018 [employer].

## 2017-09-09 ENCOUNTER — Other Ambulatory Visit: Payer: Self-pay

## 2017-09-09 ENCOUNTER — Telehealth: Payer: Self-pay | Admitting: Family Medicine

## 2017-09-09 MED ORDER — ROSUVASTATIN CALCIUM 20 MG PO TABS: 20.0000 mg | ORAL_TABLET | Freq: Every day | ORAL | 0 refills | Status: DC

## 2017-09-09 NOTE — Progress Notes (Signed)
Faxed 90d Crestor to [facility]/advised to tell pt to sched appt for December/thx dmf

## 2017-09-09 NOTE — Telephone Encounter (Signed)
Faxed 90d to pharm with note to have pt to sched appt for December/thx dmf

## 2017-09-09 NOTE — Telephone Encounter (Signed)
Ajay from [facility] called in to request a 90 day supply on medication for pt (CRESTOR)   Pharmacy Info:  [id number], [place]   Phone: [phone]

## 2017-12-14 ENCOUNTER — Encounter: Payer: Self-pay | Admitting: Family Medicine

## 2017-12-14 ENCOUNTER — Ambulatory Visit (HOSPITAL_BASED_OUTPATIENT_CLINIC_OR_DEPARTMENT_OTHER)
Admission: RE | Admit: 2017-12-14 | Discharge: 2017-12-14 | Disposition: A | Discharge status: Home / Self Care | Payer: Medicare HMO | Source: Ambulatory Visit | Attending: Family Medicine | Admitting: Family Medicine

## 2017-12-14 ENCOUNTER — Ambulatory Visit (INDEPENDENT_AMBULATORY_CARE_PROVIDER_SITE_OTHER): Payer: Medicare HMO | Admitting: Family Medicine

## 2017-12-14 VITALS — BP 126/66 | HR 77 | Temp 98.3°F | Resp 16 | Ht 60.0 in | Wt 165.8 lb

## 2017-12-14 DIAGNOSIS — M47893 Other spondylosis, cervicothoracic region: Secondary | ICD-10-CM | POA: Insufficient documentation

## 2017-12-14 DIAGNOSIS — G8929 Other chronic pain: Secondary | ICD-10-CM

## 2017-12-14 DIAGNOSIS — M549 Dorsalgia, unspecified: Secondary | ICD-10-CM | POA: Diagnosis not present

## 2017-12-14 DIAGNOSIS — M4804 Spinal stenosis, thoracic region: Secondary | ICD-10-CM | POA: Diagnosis not present

## 2017-12-14 DIAGNOSIS — M4802 Spinal stenosis, cervical region: Secondary | ICD-10-CM | POA: Insufficient documentation

## 2017-12-14 DIAGNOSIS — I700 Atherosclerosis of aorta: Secondary | ICD-10-CM | POA: Diagnosis not present

## 2017-12-14 DIAGNOSIS — M545 Low back pain, unspecified: Secondary | ICD-10-CM

## 2017-12-14 DIAGNOSIS — M546 Pain in thoracic spine: Secondary | ICD-10-CM | POA: Insufficient documentation

## 2017-12-14 DIAGNOSIS — M48061 Spinal stenosis, lumbar region without neurogenic claudication: Secondary | ICD-10-CM | POA: Diagnosis not present

## 2017-12-14 DIAGNOSIS — M542 Cervicalgia: Secondary | ICD-10-CM | POA: Diagnosis not present

## 2017-12-14 DIAGNOSIS — M47892 Other spondylosis, cervical region: Secondary | ICD-10-CM | POA: Diagnosis not present

## 2017-12-14 DIAGNOSIS — M47896 Other spondylosis, lumbar region: Secondary | ICD-10-CM | POA: Insufficient documentation

## 2017-12-14 MED ORDER — TIZANIDINE HCL 4 MG PO TABS: 4.0000 mg | ORAL_TABLET | Freq: Four times a day (QID) | ORAL | 0 refills | Status: DC | PRN

## 2017-12-14 MED ORDER — TRAMADOL HCL 50 MG PO TABS: 50.0000 mg | ORAL_TABLET | Freq: Three times a day (TID) | ORAL | 0 refills | Status: DC | PRN

## 2017-12-14 NOTE — Patient Instructions (Signed)

## 2017-12-14 NOTE — Progress Notes (Signed)
Patient ID: Cindy Velasquez, female   DOB: January 23, 1941, 77 y.o.   MRN: [id number]    Subjective:  I acted as a Education administrator for Dr. Carollee Herter.  Guerry Bruin, [place]   Patient ID: Cindy Velasquez, female    DOB: 1941-09-16, 77 y.o.   MRN: [id number]  Chief Complaint  Patient presents with  . Back Pain  . Neck Pain    HPI  Patient is in today for back and neck pain.  Both started about 2 months ago.  Has not taken anything for it.  She has had a hx of neck and back issues for years.  She has been doing chair yoga , seeing a chiropractor for years but over the last 2 months she has been getting worse.  Neck to low back is affected.  No known injury.    Patient Care Team: Carollee Herter, Alferd Apa, DO as PCP - General Clent Jacks, MD as Consulting Physician (Ophthalmology)   Past Medical History:  Diagnosis Date  . Adenomatous colon polyp   . Cancer ([place]) 03/2015   BCC R tibia   . Diverticulosis of colon   . GERD (gastroesophageal reflux disease)   . Hyperlipidemia   . Hyperplastic colon polyp   . Hypertension   . Osteoporosis     Past Surgical History:  Procedure Laterality Date  . EYE SURGERY  2012   B/L 01/2011  02/2011  . HAMMER TOE SURGERY     Bilateral  . ROTATOR CUFF REPAIR     Left  . TUBAL LIGATION      Family History  Problem Relation Age of Onset  . Hypertension Mother   . Cancer Mother 84       ?  Marland Kitchen Hypertension Father   . Stroke Father   . Hypertension Brother   . Cancer Brother 35       testicular   . Hypertension Brother   . Heart disease Brother        quadruple bypass    Social History   Socioeconomic History  . Marital status: Married    Spouse name: Not on file  . Number of children: Not on file  . Years of education: Not on file  . Highest education level: Not on file  Social Needs  . Financial resource strain: Not on file  . Food insecurity - worry: Not on file  . Food insecurity - inability: Not on file  . Transportation needs - medical: Not on file    . Transportation needs - non-medical: Not on file  Occupational History  . Occupation: retired--[employer]  Tobacco Use  . Smoking status: Former Smoker    Packs/day: 0.50    Years: 30.00    Pack years: 15.00    Types: Cigarettes    Last attempt to quit: 06/02/1991    Years since quitting: 26.5  . Smokeless tobacco: Never Used  Substance and Sexual Activity  . Alcohol use: Yes    Alcohol/week: 3.0 oz    Types: 5 Glasses of wine per week  . Drug use: No  . Sexual activity: Not Currently    Partners: Male  Other Topics Concern  . Not on file  Social History Narrative   Exercise--  [employer]--yoga and cardio 3x a week    Outpatient Medications Prior to Visit  Medication Sig Dispense Refill  . clotrimazole-betamethasone (LOTRISONE) cream Apply 1 application topically 2 (two) times daily. Use as needed for rash 30 g 1  . hydrochlorothiazide (  HYDRODIURIL) 25 MG tablet take 1/2 tablet by mouth every morning 45 tablet 3  . lisinopril (PRINIVIL,ZESTRIL) 20 MG tablet take 1 tablet by mouth once daily 90 tablet 3  . Multiple Vitamins-Minerals (ICAPS AREDS 2) CAPS Take 2 capsules by mouth daily.     . Probiotic Product (PROBIOTIC & ACIDOPHILUS EX ST PO) Take by mouth.    . rosuvastatin (CRESTOR) 20 MG tablet Take 1 tablet (20 mg total) by mouth daily. 90 tablet 0  . benzonatate (TESSALON) 100 MG capsule Take 1 capsule (100 mg total) by mouth 3 (three) times daily as needed for cough. (Patient not taking: Reported on 09/03/2017) 60 capsule 0  . fluticasone (FLONASE) 50 MCG/ACT nasal spray Place 2 sprays into both nostrils daily. (Patient not taking: Reported on 09/03/2017) 16 g 1   No facility-administered medications prior to visit.     No Known Allergies  Review of Systems  Constitutional: Negative for fever and malaise/fatigue.  HENT: Negative for congestion.   Eyes: Negative for blurred vision.  Respiratory: Negative for cough and shortness of breath.    Cardiovascular: Negative for chest pain, palpitations and leg swelling.  Gastrointestinal: Negative for vomiting.  Musculoskeletal: Positive for back pain and neck pain.  Skin: Negative for rash.  Neurological: Negative for loss of consciousness and headaches.       Objective:    Physical Exam  Constitutional: She is oriented to person, place, and time. She appears well-developed and well-nourished.  HENT:  Head: Normocephalic and atraumatic.  Eyes: Conjunctivae and EOM are normal.  Neck: Normal range of motion. Neck supple. No JVD present. Carotid bruit is not present. No thyromegaly present.  Cardiovascular: Normal rate, regular rhythm and normal heart sounds.  No murmur heard. Pulmonary/Chest: Effort normal and breath sounds normal. No respiratory distress. She has no wheezes. She has no rales. She exhibits no tenderness.  Musculoskeletal: Normal range of motion. She exhibits no edema, tenderness or deformity.  No pain with palpatations No dec rom   Neurological: She is alert and oriented to person, place, and time. She displays normal reflexes. She exhibits normal muscle tone. Coordination normal.  Psychiatric: She has a normal mood and affect.  Nursing note and vitals reviewed.   BP 126/66 (BP Location: Right Arm, Cuff Size: Large)   Pulse 77   Temp 98.3 F (36.8 C) (Oral)   Resp 16   Ht 5' (1.524 m)   Wt 165 lb 12.8 oz (75.2 kg)   SpO2 96%   BMI 32.38 kg/m  Wt Readings from Last 3 Encounters:  12/14/17 165 lb 12.8 oz (75.2 kg)  09/03/17 162 lb 9.6 oz (73.8 kg)  07/02/17 162 lb (73.5 kg)   BP Readings from Last 3 Encounters:  12/14/17 126/66  09/03/17 (!) 147/64  07/02/17 128/80     Immunization History  Administered Date(s) Administered  . H1N1 12/28/2008  . Influenza Whole 10/20/2007, 10/18/2008, 11/15/2012  . Influenza-Unspecified 12/20/2013, 11/16/2016  . Pneumococcal Conjugate-13 04/23/2015  . Pneumococcal Polysaccharide-23 12/28/2008  . Tdap  08/01/2013    Health Maintenance  Topic Date Due  . INFLUENZA VACCINE  07/08/2017  . MAMMOGRAM  05/29/2018  . TETANUS/TDAP  08/02/2023  . DEXA SCAN  Completed  . PNA vac Low Risk Adult  Completed    Lab Results  Component Value Date   WBC 6.7 10/13/2014   HGB 14.4 10/13/2014   HCT 44.4 10/13/2014   PLT 262 10/13/2014   GLUCOSE 95 07/02/2017   CHOL 127 07/02/2017  TRIG 127.0 07/02/2017   HDL 38.70 (L) 07/02/2017   LDLDIRECT 129.1 08/23/2007   LDLCALC 63 07/02/2017   ALT 30 07/02/2017   AST 26 07/02/2017   NA 141 07/02/2017   K 4.0 07/02/2017   CL 104 07/02/2017   CREATININE 0.70 07/02/2017   BUN 14 07/02/2017   CO2 30 07/02/2017   TSH 1.71 07/02/2017    Lab Results  Component Value Date   TSH 1.71 07/02/2017   Lab Results  Component Value Date   WBC 6.7 10/13/2014   HGB 14.4 10/13/2014   HCT 44.4 10/13/2014   MCV 91.2 10/13/2014   PLT 262 10/13/2014   Lab Results  Component Value Date   NA 141 07/02/2017   K 4.0 07/02/2017   CO2 30 07/02/2017   GLUCOSE 95 07/02/2017   BUN 14 07/02/2017   CREATININE 0.70 07/02/2017   BILITOT 0.6 07/02/2017   ALKPHOS 65 07/02/2017   AST 26 07/02/2017   ALT 30 07/02/2017   PROT 7.6 07/02/2017   ALBUMIN 3.9 07/02/2017   CALCIUM 9.6 07/02/2017   GFR 86.49 07/02/2017   Lab Results  Component Value Date   CHOL 127 07/02/2017   Lab Results  Component Value Date   HDL 38.70 (L) 07/02/2017   Lab Results  Component Value Date   LDLCALC 63 07/02/2017   Lab Results  Component Value Date   TRIG 127.0 07/02/2017   Lab Results  Component Value Date   CHOLHDL 3 07/02/2017   No results found for: HGBA1C       Assessment & Plan:   Problem List Items Addressed This Visit      Unprioritized   BACK PAIN, LUMBAR - Primary   Relevant Medications   tiZANidine (ZANAFLEX) 4 MG tablet   traMADol (ULTRAM) 50 MG tablet   Other Relevant Orders   DG Lumbar Spine Complete    Other Visit Diagnoses    Neck pain        Relevant Medications   tiZANidine (ZANAFLEX) 4 MG tablet   traMADol (ULTRAM) 50 MG tablet   Other Relevant Orders   DG Cervical Spine Complete   Mid back pain       Relevant Medications   tiZANidine (ZANAFLEX) 4 MG tablet   traMADol (ULTRAM) 50 MG tablet   Other Relevant Orders   DG Thoracic Spine 2 View      I have discontinued Sefora A. Champney's fluticasone and benzonatate. I am also having her start on tiZANidine and traMADol. Additionally, I am having her maintain her ICAPS AREDS 2, Probiotic Product (PROBIOTIC & ACIDOPHILUS EX ST PO), clotrimazole-betamethasone, lisinopril, hydrochlorothiazide, and rosuvastatin.  Meds ordered this encounter  Medications  . tiZANidine (ZANAFLEX) 4 MG tablet    Sig: Take 1 tablet (4 mg total) by mouth every 6 (six) hours as needed for muscle spasms.    Dispense:  30 tablet    Refill:  0  . traMADol (ULTRAM) 50 MG tablet    Sig: Take 1 tablet (50 mg total) by mouth every 8 (eight) hours as needed.    Dispense:  30 tablet    Refill:  0    CMA served as scribe during this visit. History, Physical and Plan performed by medical provider. Documentation and orders reviewed and attested to.  Ann Held, DO

## 2017-12-18 ENCOUNTER — Telehealth: Payer: Self-pay | Admitting: Family Medicine

## 2017-12-18 DIAGNOSIS — M9979 Connective tissue and disc stenosis of intervertebral foramina of abdomen and other regions: Secondary | ICD-10-CM

## 2017-12-18 NOTE — Telephone Encounter (Signed)
Copied from [place] [id number]. Topic: Quick Communication - See Telephone Encounter >> Dec 18, 2017 10:26 AM Cleaster Corin, NT wrote: CRM for notification. See Telephone encounter for:   12/18/17. Pt. Calling and stated that she will like to set up mri. Pt. Can be reached at [phone]

## 2017-12-18 NOTE — Telephone Encounter (Signed)
Patient notified

## 2017-12-18 NOTE — Telephone Encounter (Signed)
Mri order placed

## 2017-12-23 DIAGNOSIS — H353132 Nonexudative age-related macular degeneration, bilateral, intermediate dry stage: Secondary | ICD-10-CM | POA: Diagnosis not present

## 2017-12-23 DIAGNOSIS — Z961 Presence of intraocular lens: Secondary | ICD-10-CM | POA: Diagnosis not present

## 2017-12-23 DIAGNOSIS — H10413 Chronic giant papillary conjunctivitis, bilateral: Secondary | ICD-10-CM | POA: Diagnosis not present

## 2017-12-23 DIAGNOSIS — H04223 Epiphora due to insufficient drainage, bilateral lacrimal glands: Secondary | ICD-10-CM | POA: Diagnosis not present

## 2017-12-23 NOTE — Telephone Encounter (Signed)
Patient wants to know where the MRI will be. Call back is [phone]

## 2017-12-23 NOTE — Telephone Encounter (Signed)
Have this been scheduled yet?

## 2018-01-01 ENCOUNTER — Ambulatory Visit (HOSPITAL_COMMUNITY)
Admission: RE | Admit: 2018-01-01 | Discharge: 2018-01-01 | Disposition: A | Discharge status: Home / Self Care | Payer: Medicare HMO | Source: Ambulatory Visit | Attending: Family Medicine | Admitting: Family Medicine

## 2018-01-01 DIAGNOSIS — M9979 Connective tissue and disc stenosis of intervertebral foramina of abdomen and other regions: Secondary | ICD-10-CM | POA: Diagnosis not present

## 2018-01-01 DIAGNOSIS — M5124 Other intervertebral disc displacement, thoracic region: Secondary | ICD-10-CM | POA: Diagnosis not present

## 2018-01-19 ENCOUNTER — Other Ambulatory Visit: Payer: Self-pay | Admitting: Family Medicine

## 2018-01-19 DIAGNOSIS — I10 Essential (primary) hypertension: Secondary | ICD-10-CM

## 2018-02-27 ENCOUNTER — Other Ambulatory Visit: Payer: Self-pay | Admitting: Family Medicine

## 2018-04-05 ENCOUNTER — Other Ambulatory Visit: Payer: Self-pay | Admitting: Family Medicine

## 2018-05-05 ENCOUNTER — Other Ambulatory Visit: Payer: Self-pay | Admitting: Family Medicine

## 2018-06-04 ENCOUNTER — Other Ambulatory Visit: Payer: Self-pay | Admitting: Family Medicine

## 2018-06-14 ENCOUNTER — Ambulatory Visit: Payer: Self-pay | Admitting: *Deleted

## 2018-06-14 NOTE — Telephone Encounter (Addendum)
Pt having swelling in feet for the last 4 to 6 weeks. She states that they got worse last week on vacation. (eating and drinking more than usual). She became aware when her shoes became too tight. They are swollen this morning but not hugh she said. They do not go down over night.  Denies any symptoms of chest pain, fever,  shortness of breath or any weakness. She also stated she has stinging but not painful. And her feet are cool, which has been going on (she states nothing new). Appointment scheduled for Thursday. She is going out of town this weekend and wishes to be called for any earlier cancellations.  Will route to flow at [facility] at [facility]. Call back for increase in symptoms.  Reason for Disposition . [1] MILD swelling of both ankles (i.e., pedal edema) AND [2] new onset or worsening  Answer Assessment - Initial Assessment Questions 1. ONSET: "When did the swelling start?" (e.g., minutes, hours, days)     Off and on for the last 4 to 6 weeks and last week it was worst 2. LOCATION: "What part of the leg is swollen?"  "Are both legs swollen or just one leg?"   feet 3. SEVERITY: "How bad is the swelling?" (e.g., localized; mild, moderate, severe)  - Localized - small area of swelling localized to one leg  - MILD pedal edema - swelling limited to foot and ankle, pitting edema < 1/4 inch (6 mm) deep, rest and elevation eliminate most or all swelling  - MODERATE edema - swelling of lower leg to knee, pitting edema > 1/4 inch (6 mm) deep, rest and elevation only partially reduce swelling  - SEVERE edema - swelling extends above knee, facial or hand swelling present      mild 4. REDNESS: "Does the swelling look red or infected?"     no 5. PAIN: "Is the swelling painful to touch?" If so, ask: "How painful is it?"   (Scale 1-10; mild, moderate or severe)     Stinging  6. FEVER: "Do you have a fever?" If so, ask: "What is it, how was it measured, and when did it start?"       no 7. CAUSE: "What do you think is causing the leg swelling?"     Not sure 8. MEDICAL HISTORY: "Do you have a history of heart failure, kidney disease, liver failure, or cancer?"     no 9. RECURRENT SYMPTOM: "Have you had leg swelling before?" If so, ask: "When was the last time?" "What happened that time?"     no 10. OTHER SYMPTOMS: "Do you have any other symptoms?" (e.g., chest pain, difficulty breathing)       no  Protocols used: LEG SWELLING AND EDEMA-A-AH

## 2018-06-15 NOTE — Telephone Encounter (Signed)
I probably could have see her today if I had been told this am--- I just got this message

## 2018-06-15 NOTE — Telephone Encounter (Signed)
Call her --- if she can not wait until Thursday she can be seen by someone else tomorrow if there is an opening

## 2018-06-16 NOTE — Telephone Encounter (Signed)
Spoke with pt. There is no current availability in our office today. Pt denies chest pain, shortness of breath. Reports she has been elevating her feet the last few days and they have almost returned to normal. States "feet are not cold to touch but feels cold internally". Pt has appt with PCP for tomorrow and states she prefers to keep that appt. Advised pt to go to ER if swelling becomes severe, develops chest pain, sob or discoloration of feet and she voices understanding.

## 2018-06-16 NOTE — Telephone Encounter (Signed)
Thank you for checking on her.  I appreciate it!

## 2018-06-17 ENCOUNTER — Encounter: Payer: Self-pay | Admitting: Family Medicine

## 2018-06-17 ENCOUNTER — Telehealth: Payer: Self-pay | Admitting: Family Medicine

## 2018-06-17 ENCOUNTER — Ambulatory Visit (INDEPENDENT_AMBULATORY_CARE_PROVIDER_SITE_OTHER): Payer: Medicare HMO | Admitting: Family Medicine

## 2018-06-17 VITALS — BP 130/46 | HR 73 | Temp 98.6°F | Resp 16 | Ht 60.0 in | Wt 163.2 lb

## 2018-06-17 DIAGNOSIS — G609 Hereditary and idiopathic neuropathy, unspecified: Secondary | ICD-10-CM | POA: Diagnosis not present

## 2018-06-17 DIAGNOSIS — R209 Unspecified disturbances of skin sensation: Secondary | ICD-10-CM | POA: Diagnosis not present

## 2018-06-17 DIAGNOSIS — E785 Hyperlipidemia, unspecified: Secondary | ICD-10-CM

## 2018-06-17 DIAGNOSIS — R600 Localized edema: Secondary | ICD-10-CM

## 2018-06-17 DIAGNOSIS — E669 Obesity, unspecified: Secondary | ICD-10-CM

## 2018-06-17 NOTE — Assessment & Plan Note (Signed)
No hx dm Maybe from swelling  If no improvement---  Can discuss arterial dopper with ABI

## 2018-06-17 NOTE — Assessment & Plan Note (Signed)
Comes and goes Can increase to 1 hctz 25mg  from 1/2 if needed for edema Compression socks Elevate legs Drink plenty of water

## 2018-06-17 NOTE — Progress Notes (Signed)
Patient ID: Cindy Velasquez, female   DOB: Sep 03, 1941, 77 y.o.   MRN: [id number]     Subjective:  I acted as a Education administrator for Dr. Carollee Herter.  Guerry Bruin, [place]   Patient ID: Cindy Velasquez, female    DOB: 09/14/1941, 77 y.o.   MRN: [id number]  Chief Complaint  Patient presents with  . Foot Swelling    both feet    HPI  Patient is in today for swelling in both feet that comes and goes.  He also c/o feet feeling cold all the time   Patient Care Team: Carollee Herter, Alferd Apa, DO as PCP - General Clent Jacks, MD as Consulting Physician (Ophthalmology)   Past Medical History:  Diagnosis Date  . Adenomatous colon polyp   . Cancer ([place]) 03/2015   BCC R tibia   . Diverticulosis of colon   . GERD (gastroesophageal reflux disease)   . Hyperlipidemia   . Hyperplastic colon polyp   . Hypertension   . Osteoporosis     Past Surgical History:  Procedure Laterality Date  . EYE SURGERY  2012   B/L 01/2011  02/2011  . HAMMER TOE SURGERY     Bilateral  . ROTATOR CUFF REPAIR     Left  . TUBAL LIGATION      Family History  Problem Relation Age of Onset  . Hypertension Mother   . Cancer Mother 69       ?  Marland Kitchen Hypertension Father   . Stroke Father   . Hypertension Brother   . Cancer Brother 35       testicular   . Hypertension Brother   . Heart disease Brother        quadruple bypass    Social History   Socioeconomic History  . Marital status: Married    Spouse name: Not on file  . Number of children: Not on file  . Years of education: Not on file  . Highest education level: Not on file  Occupational History  . Occupation: retired--[employer]  Social Needs  . Financial resource strain: Not on file  . Food insecurity:    Worry: Not on file    Inability: Not on file  . Transportation needs:    Medical: Not on file    Non-medical: Not on file  Tobacco Use  . Smoking status: Former Smoker    Packs/day: 0.50    Years: 30.00    Pack years: 15.00    Types:  Cigarettes    Last attempt to quit: 06/02/1991    Years since quitting: 27.0  . Smokeless tobacco: Never Used  Substance and Sexual Activity  . Alcohol use: Yes    Alcohol/week: 3.0 oz    Types: 5 Glasses of wine per week  . Drug use: No  . Sexual activity: Not Currently    Partners: Male  Lifestyle  . Physical activity:    Days per week: Not on file    Minutes per session: Not on file  . Stress: Not on file  Relationships  . Social connections:    Talks on phone: Not on file    Gets together: Not on file    Attends religious service: Not on file    Active member of club or organization: Not on file    Attends meetings of clubs or organizations: Not on file    Relationship status: Not on file  . Intimate partner violence:    Fear of current or ex partner:  Not on file    Emotionally abused: Not on file    Physically abused: Not on file    Forced sexual activity: Not on file  Other Topics Concern  . Not on file  Social History Narrative   Exercise--  [employer]--yoga and cardio 3x a week    Outpatient Medications Prior to Visit  Medication Sig Dispense Refill  . clotrimazole-betamethasone (LOTRISONE) cream Apply 1 application topically 2 (two) times daily. Use as needed for rash 30 g 1  . hydrochlorothiazide (HYDRODIURIL) 25 MG tablet TAKE 1/2 TABLET BY MOUTH EVERY MORNING 45 tablet 1  . lisinopril (PRINIVIL,ZESTRIL) 20 MG tablet take 1 tablet by mouth once daily 90 tablet 1  . Multiple Vitamins-Minerals (ICAPS AREDS 2) CAPS Take 2 capsules by mouth daily.     . Probiotic Product (PROBIOTIC & ACIDOPHILUS EX ST PO) Take by mouth.    . rosuvastatin (CRESTOR) 20 MG tablet TAKE 1 TABLET BY MOUTH EVERY DAY 30 tablet 0  . tiZANidine (ZANAFLEX) 4 MG tablet Take 1 tablet (4 mg total) by mouth every 6 (six) hours as needed for muscle spasms. 30 tablet 0  . traMADol (ULTRAM) 50 MG tablet Take 1 tablet (50 mg total) by mouth every 8 (eight) hours as needed. 30 tablet 0   No  facility-administered medications prior to visit.     No Known Allergies  Review of Systems  Constitutional: Negative for fever and malaise/fatigue.  HENT: Negative for congestion.   Eyes: Negative for blurred vision.  Respiratory: Negative for cough and shortness of breath.   Cardiovascular: Negative for chest pain, palpitations and leg swelling.  Gastrointestinal: Negative for vomiting.  Musculoskeletal: Negative for back pain.  Skin: Negative for rash.  Neurological: Negative for loss of consciousness and headaches.       Objective:    Physical Exam  Constitutional: She is oriented to person, place, and time. She appears well-developed and well-nourished.  HENT:  Head: Normocephalic and atraumatic.  Eyes: Conjunctivae and EOM are normal.  Neck: Normal range of motion. Neck supple. No JVD present. Carotid bruit is not present. No thyromegaly present.  Cardiovascular: Normal rate, regular rhythm and normal heart sounds.  No murmur heard. Pulmonary/Chest: Effort normal and breath sounds normal. No respiratory distress. She has no wheezes. She has no rales. She exhibits no tenderness.  Musculoskeletal: She exhibits no edema.  Neurological: She is alert and oriented to person, place, and time.  Psychiatric: She has a normal mood and affect.  Nursing note and vitals reviewed.   BP (!) 130/46   Pulse 73   Temp 98.6 F (37 C) (Oral)   Resp 16   Ht 5' (1.524 m)   Wt 163 lb 3.2 oz (74 kg)   SpO2 95%   BMI 31.87 kg/m  Wt Readings from Last 3 Encounters:  06/17/18 163 lb 3.2 oz (74 kg)  12/14/17 165 lb 12.8 oz (75.2 kg)  09/03/17 162 lb 9.6 oz (73.8 kg)   BP Readings from Last 3 Encounters:  06/17/18 (!) 130/46  12/14/17 126/66  09/03/17 (!) 147/64     Immunization History  Administered Date(s) Administered  . H1N1 12/28/2008  . Influenza Whole 10/20/2007, 10/18/2008, 11/15/2012  . Influenza-Unspecified 12/20/2013, 11/16/2016  . Pneumococcal Conjugate-13  04/23/2015  . Pneumococcal Polysaccharide-23 12/28/2008  . Tdap 08/01/2013    Health Maintenance  Topic Date Due  . MAMMOGRAM  05/29/2018  . INFLUENZA VACCINE  07/08/2018  . TETANUS/TDAP  08/02/2023  . DEXA SCAN  Completed  .  PNA vac Low Risk Adult  Completed    Lab Results  Component Value Date   WBC 6.7 10/13/2014   HGB 14.4 10/13/2014   HCT 44.4 10/13/2014   PLT 262 10/13/2014   GLUCOSE 95 07/02/2017   CHOL 127 07/02/2017   TRIG 127.0 07/02/2017   HDL 38.70 (L) 07/02/2017   LDLDIRECT 129.1 08/23/2007   LDLCALC 63 07/02/2017   ALT 30 07/02/2017   AST 26 07/02/2017   NA 141 07/02/2017   K 4.0 07/02/2017   CL 104 07/02/2017   CREATININE 0.70 07/02/2017   BUN 14 07/02/2017   CO2 30 07/02/2017   TSH 1.71 07/02/2017    Lab Results  Component Value Date   TSH 1.71 07/02/2017   Lab Results  Component Value Date   WBC 6.7 10/13/2014   HGB 14.4 10/13/2014   HCT 44.4 10/13/2014   MCV 91.2 10/13/2014   PLT 262 10/13/2014   Lab Results  Component Value Date   NA 141 07/02/2017   K 4.0 07/02/2017   CO2 30 07/02/2017   GLUCOSE 95 07/02/2017   BUN 14 07/02/2017   CREATININE 0.70 07/02/2017   BILITOT 0.6 07/02/2017   ALKPHOS 65 07/02/2017   AST 26 07/02/2017   ALT 30 07/02/2017   PROT 7.6 07/02/2017   ALBUMIN 3.9 07/02/2017   CALCIUM 9.6 07/02/2017   GFR 86.49 07/02/2017   Lab Results  Component Value Date   CHOL 127 07/02/2017   Lab Results  Component Value Date   HDL 38.70 (L) 07/02/2017   Lab Results  Component Value Date   LDLCALC 63 07/02/2017   Lab Results  Component Value Date   TRIG 127.0 07/02/2017   Lab Results  Component Value Date   CHOLHDL 3 07/02/2017   No results found for: HGBA1C       Assessment & Plan:   Problem List Items Addressed This Visit      Unprioritized   Bilateral cold feet - Primary   Relevant Orders   CBC with Differential/Platelet   Comprehensive metabolic panel   TSH   Hyperlipidemia   Relevant  Orders   Lipid panel   Idiopathic peripheral neuropathy    No hx dm Maybe from swelling  If no improvement---  Can discuss arterial dopper with ABI      Relevant Orders   CBC with Differential/Platelet   Comprehensive metabolic panel   TSH   Lower extremity edema    Comes and goes Can increase to 1 hctz 25mg  from 1/2 if needed for edema Compression socks Elevate legs Drink plenty of water      Obesity (BMI 30-39.9)   Relevant Orders   Amb Ref to Medical Weight Management      I have discontinued Sapir A. Buescher's tiZANidine and traMADol. I am also having her maintain her ICAPS AREDS 2, Probiotic Product (PROBIOTIC & ACIDOPHILUS EX ST PO), clotrimazole-betamethasone, lisinopril, hydrochlorothiazide, and rosuvastatin.  No orders of the defined types were placed in this encounter.   CMA served as Education administrator during this visit. History, Physical and Plan performed by medical provider. Documentation and orders reviewed and attested to.  Ann Held, DO

## 2018-06-17 NOTE — Telephone Encounter (Signed)
Patient is scheduled for Annual Medicare Wellness on 9/27 & scheduled to follow up with [place] on same day- does this patient still get the CPE or or just the follow appt?  Patient is scheduled in the afternoon & would like to do her fasting labs that morning, please place orders for cpe labs.

## 2018-06-17 NOTE — Patient Instructions (Signed)
Edema Edema is an abnormal buildup of fluids in your bodytissues. Edema is somewhatdependent on gravity to pull the fluid to the lowest place in your body. That makes the condition more common in the legs and thighs (lower extremities). Painless swelling of the feet and ankles is common and becomes more likely as you get older. It is also common in looser tissues, like around your eyes. When the affected area is squeezed, the fluid may move out of that spot and leave a dent for a few moments. This dent is called pitting. What are the causes? There are many possible causes of edema. Eating too much salt and being on your feet or sitting for a long time can cause edema in your legs and ankles. Hot weather may make edema worse. Common medical causes of edema include:  Heart failure.  Liver disease.  Kidney disease.  Weak blood vessels in your legs.  Cancer.  An injury.  Pregnancy.  Some medications.  Obesity.  What are the signs or symptoms? Edema is usually painless.Your skin may look swollen or shiny. How is this diagnosed? Your health care provider may be able to diagnose edema by asking about your medical history and doing a physical exam. You may need to have tests such as X-rays, an electrocardiogram, or blood tests to check for medical conditions that may cause edema. How is this treated? Edema treatment depends on the cause. If you have heart, liver, or kidney disease, you need the treatment appropriate for these conditions. General treatment may include:  Elevation of the affected body part above the level of your heart.  Compression of the affected body part. Pressure from elastic bandages or support stockings squeezes the tissues and forces fluid back into the blood vessels. This keeps fluid from entering the tissues.  Restriction of fluid and salt intake.  Use of a water pill (diuretic). These medications are appropriate only for some types of edema. They pull fluid  out of your body and make you urinate more often. This gets rid of fluid and reduces swelling, but diuretics can have side effects. Only use diuretics as directed by your health care provider.  Follow these instructions at home:  Keep the affected body part above the level of your heart when you are lying down.  Do not sit still or stand for prolonged periods.  Do not put anything directly under your knees when lying down.  Do not wear constricting clothing or garters on your upper legs.  Exercise your legs to work the fluid back into your blood vessels. This may help the swelling go down.  Wear elastic bandages or support stockings to reduce ankle swelling as directed by your health care provider.  Eat a low-salt diet to reduce fluid if your health care provider recommends it.  Only take medicines as directed by your health care provider. Contact a health care provider if:  Your edema is not responding to treatment.  You have heart, liver, or kidney disease and notice symptoms of edema.  You have edema in your legs that does not improve after elevating them.  You have sudden and unexplained weight gain. Get help right away if:  You develop shortness of breath or chest pain.  You cannot breathe when you lie down.  You develop pain, redness, or warmth in the swollen areas.  You have heart, liver, or kidney disease and suddenly get edema.  You have a fever and your symptoms suddenly get worse. This information is   not intended to replace advice given to you by your health care provider. Make sure you discuss any questions you have with your health care provider. Document Released: 11/24/2005 Document Revised: 05/01/2016 Document Reviewed: 09/16/2013 Elsevier Interactive Patient Education  2017 [employer]  

## 2018-06-18 ENCOUNTER — Encounter

## 2018-06-18 LAB — LIPID PANEL
Cholesterol: 152 mg/dL (ref 0–200)
HDL: 46.9 mg/dL (ref >39.00)
LDL Cholesterol: 73 mg/dL (ref 0–99)
NonHDL: 105.55
TRIGLYCERIDES: 163 mg/dL — AB (ref 0.0–149.0)
Total CHOL/HDL Ratio: 3
VLDL: 32.6 mg/dL (ref 0.0–40.0)

## 2018-06-18 LAB — CBC WITH DIFFERENTIAL/PLATELET
BASOS PCT: 0.9 % (ref 0.0–3.0)
Basophils Absolute: 0.1 10*3/uL (ref 0.0–0.1)
EOS ABS: 0.3 10*3/uL (ref 0.0–0.7)
Eosinophils Relative: 4.2 % (ref 0.0–5.0)
HEMATOCRIT: 43.4 % (ref 36.0–46.0)
HEMOGLOBIN: 14.3 g/dL (ref 12.0–15.0)
LYMPHS PCT: 28.6 % (ref 12.0–46.0)
Lymphs Abs: 2.1 10*3/uL (ref 0.7–4.0)
MCHC: 33 g/dL (ref 30.0–36.0)
MCV: 91.4 fl (ref 78.0–100.0)
MONO ABS: 1 10*3/uL (ref 0.1–1.0)
Monocytes Relative: 12.7 % — ABNORMAL HIGH (ref 3.0–12.0)
Neutro Abs: 4 10*3/uL (ref 1.4–7.7)
Neutrophils Relative %: 53.6 % (ref 43.0–77.0)
Platelets: 267 10*3/uL (ref 150.0–400.0)
RBC: 4.74 Mil/uL (ref 3.87–5.11)
RDW: 15.5 % (ref 11.5–15.5)
WBC: 7.5 10*3/uL (ref 4.0–10.5)

## 2018-06-18 LAB — COMPREHENSIVE METABOLIC PANEL
ALBUMIN: 4.1 g/dL (ref 3.5–5.2)
ALK PHOS: 73 U/L (ref 39–117)
ALT: 24 U/L (ref 0–35)
AST: 22 U/L (ref 0–37)
BILIRUBIN TOTAL: 0.5 mg/dL (ref 0.2–1.2)
BUN: 19 mg/dL (ref 6–23)
CALCIUM: 10.1 mg/dL (ref 8.4–10.5)
CO2: 33 mEq/L — ABNORMAL HIGH (ref 19–32)
CREATININE: 0.72 mg/dL (ref 0.40–1.20)
Chloride: 104 mEq/L (ref 96–112)
GFR: 83.51 mL/min (ref >60.00)
Glucose, Bld: 97 mg/dL (ref 70–99)
Potassium: 5.3 mEq/L — ABNORMAL HIGH (ref 3.5–5.1)
SODIUM: 145 meq/L (ref 135–145)
TOTAL PROTEIN: 7.4 g/dL (ref 6.0–8.3)

## 2018-06-18 LAB — TSH: TSH: 1.95 u[IU]/mL (ref 0.35–4.50)

## 2018-06-22 ENCOUNTER — Other Ambulatory Visit: Payer: Self-pay | Admitting: *Deleted

## 2018-06-22 DIAGNOSIS — E785 Hyperlipidemia, unspecified: Secondary | ICD-10-CM

## 2018-06-22 DIAGNOSIS — I10 Essential (primary) hypertension: Secondary | ICD-10-CM

## 2018-06-22 NOTE — Telephone Encounter (Signed)
She can have a CPE if she wants since she has [facility] and she can get labs done that morning.  I have other diagnosis that I can use instead of the physical code.  I have future ordered her labs

## 2018-06-23 DIAGNOSIS — H04223 Epiphora due to insufficient drainage, bilateral lacrimal glands: Secondary | ICD-10-CM | POA: Diagnosis not present

## 2018-06-23 DIAGNOSIS — H353132 Nonexudative age-related macular degeneration, bilateral, intermediate dry stage: Secondary | ICD-10-CM | POA: Diagnosis not present

## 2018-06-23 DIAGNOSIS — Z961 Presence of intraocular lens: Secondary | ICD-10-CM | POA: Diagnosis not present

## 2018-06-23 DIAGNOSIS — H10413 Chronic giant papillary conjunctivitis, bilateral: Secondary | ICD-10-CM | POA: Diagnosis not present

## 2018-07-16 ENCOUNTER — Other Ambulatory Visit (INDEPENDENT_AMBULATORY_CARE_PROVIDER_SITE_OTHER): Payer: Medicare HMO

## 2018-07-16 DIAGNOSIS — E875 Hyperkalemia: Secondary | ICD-10-CM

## 2018-07-16 LAB — BASIC METABOLIC PANEL
BUN: 18 mg/dL (ref 6–23)
CALCIUM: 9.8 mg/dL (ref 8.4–10.5)
CO2: 32 mEq/L (ref 19–32)
Chloride: 104 mEq/L (ref 96–112)
Creatinine, Ser: 0.76 mg/dL (ref 0.40–1.20)
GFR: 78.44 mL/min (ref >60.00)
GLUCOSE: 82 mg/dL (ref 70–99)
Potassium: 5.1 mEq/L (ref 3.5–5.1)
Sodium: 141 mEq/L (ref 135–145)

## 2018-07-22 ENCOUNTER — Other Ambulatory Visit: Payer: Self-pay | Admitting: Family Medicine

## 2018-09-02 ENCOUNTER — Encounter (INDEPENDENT_AMBULATORY_CARE_PROVIDER_SITE_OTHER): Payer: Medicare HMO

## 2018-09-02 NOTE — Progress Notes (Signed)
Subjective:   AZHAR YOGI is a 77 y.o. female who presents for Medicare Annual (Subsequent) preventive examination.  Review of Systems: No ROS.  Medicare Wellness Visit. Additional risk factors are reflected in the social history. Cardiac Risk Factors include: advanced age (>48men, >63 women);dyslipidemia;hypertension;obesity (BMI >30kg/m2) Sleep patterns:  Restless sleep. Takes [facility] sleep aid. Home Safety/Smoke Alarms: Feels safe in home. Smoke alarms in place. Lives with husband in 1 story home.    Female:        Mammo- pt states she will schedule.       Dexa scan- active order       CCS-next due 04/2020     Objective:     Vitals: BP 132/72 (BP Location: Left Arm, Patient Position: Sitting, Cuff Size: Normal)   Pulse 69   Ht 5' (1.524 m)   Wt 162 lb 9.6 oz (73.8 kg)   SpO2 95%   BMI 31.76 kg/m   Body mass index is 31.76 kg/m.  Advanced Directives 09/03/2018 09/03/2017 08/05/2016  Does Patient Have a Medical Advance Directive? Yes Yes Yes  Type of Paramedic of [place];Living will [place];Living will [place];Living will  Copy of [place] in Chart? No - copy requested No - copy requested No - copy requested    Tobacco Social History   Tobacco Use  Smoking Status Former Smoker  . Packs/day: 0.50  . Years: 30.00  . Pack years: 15.00  . Types: Cigarettes  . Last attempt to quit: 06/02/1991  . Years since quitting: 27.2  Smokeless Tobacco Never Used     Counseling given: Not Answered   Clinical Intake: Pain : No/denies pain    Past Medical History:  Diagnosis Date  . Adenomatous colon polyp   . Cancer ([place]) 03/2015   BCC R tibia   . Diverticulosis of colon   . GERD (gastroesophageal reflux disease)   . Hyperlipidemia   . Hyperplastic colon polyp   . Hypertension   . Osteoporosis    Past Surgical History:  Procedure Laterality Date  . EYE SURGERY  2012   B/L  01/2011  02/2011  . HAMMER TOE SURGERY     Bilateral  . ROTATOR CUFF REPAIR     Left  . TUBAL LIGATION     Family History  Problem Relation Age of Onset  . Hypertension Mother   . Cancer Mother 71       ?  Marland Kitchen Hypertension Father   . Stroke Father   . Hypertension Brother   . Cancer Brother 35       testicular   . Hypertension Brother   . Heart disease Brother        quadruple bypass   Social History   Socioeconomic History  . Marital status: Married    Spouse name: Not on file  . Number of children: Not on file  . Years of education: Not on file  . Highest education level: Not on file  Occupational History  . Occupation: retired--[employer]  Social Needs  . Financial resource strain: Not on file  . Food insecurity:    Worry: Not on file    Inability: Not on file  . Transportation needs:    Medical: Not on file    Non-medical: Not on file  Tobacco Use  . Smoking status: Former Smoker    Packs/day: 0.50    Years: 30.00    Pack years: 15.00  Types: Cigarettes    Last attempt to quit: 06/02/1991    Years since quitting: 27.2  . Smokeless tobacco: Never Used  Substance and Sexual Activity  . Alcohol use: Yes    Alcohol/week: 5.0 standard drinks    Types: 5 Glasses of wine per week  . Drug use: No  . Sexual activity: Not Currently    Partners: Male  Lifestyle  . Physical activity:    Days per week: Not on file    Minutes per session: Not on file  . Stress: Not on file  Relationships  . Social connections:    Talks on phone: Not on file    Gets together: Not on file    Attends religious service: Not on file    Active member of club or organization: Not on file    Attends meetings of clubs or organizations: Not on file    Relationship status: Not on file  Other Topics Concern  . Not on file  Social History Narrative   Exercise--  [employer]--yoga and cardio 3x a week    Outpatient Encounter Medications as of 09/03/2018  Medication Sig    . clotrimazole-betamethasone (LOTRISONE) cream Apply 1 application topically 2 (two) times daily. Use as needed for rash  . hydrochlorothiazide (HYDRODIURIL) 25 MG tablet TAKE 1/2 TABLET BY MOUTH EVERY MORNING  . lisinopril (PRINIVIL,ZESTRIL) 20 MG tablet take 1 tablet by mouth once daily  . Multiple Vitamins-Minerals (ICAPS AREDS 2) CAPS Take 2 capsules by mouth daily.   . rosuvastatin (CRESTOR) 20 MG tablet TAKE 1 TABLET BY MOUTH EVERY DAY  . [DISCONTINUED] Probiotic Product (PROBIOTIC & ACIDOPHILUS EX ST PO) Take by mouth.   No facility-administered encounter medications on file as of 09/03/2018.     Activities of Daily Living In your present state of health, do you have any difficulty performing the following activities: 09/03/2018 09/03/2017  Hearing? N N  Vision? N N  Comment - wears glasses. Dr.Groat every 6 months.  Difficulty concentrating or making decisions? N N  Walking or climbing stairs? N N  Dressing or bathing? N N  Doing errands, shopping? N N  Preparing Food and eating ? N N  Using the Toilet? N N  In the past six months, have you accidently leaked urine? Tempie Donning  Comment wears pad wears pad  Do you have problems with loss of bowel control? N N  Managing your Medications? N N  Managing your Finances? N N  Housekeeping or managing your Housekeeping? N N  Some recent data might be hidden    Patient Care Team: Carollee Herter, Alferd Apa, DO as PCP - General Clent Jacks, MD as Consulting Physician (Ophthalmology)    Assessment:   This is a routine wellness examination for [place]. Physical assessment deferred to PCP.  Exercise Activities and Dietary recommendations Current Exercise Habits: Structured exercise class, Type of exercise: stretching;yoga, Time (Minutes): 60, Frequency (Times/Week): 3, Weekly Exercise (Minutes/Week): 180, Intensity: Mild Diet (meal preparation, eat out, water intake, caffeinated beverages, dairy products, fruits and vegetables): well  balanced    Goals    . Increase physical activity    . Increase water intake       Fall Risk Fall Risk  09/03/2018 09/03/2017 08/05/2016 02/07/2016 11/27/2014  Falls in the past year? No No No No No   Depression Screen PHQ 2/9 Scores 09/03/2018 09/03/2017 08/05/2016 02/07/2016  PHQ - 2 Score 0 0 1 0     Cognitive Function Ad8 score reviewed for issues:  Issues making decisions:no  Less interest in hobbies / activities:no  Repeats questions, stories (family complaining):no  Trouble using ordinary gadgets (microwave, computer, phone):no  Forgets the month or year: no  Mismanaging finances: no  Remembering appts:no  Daily problems with thinking and/or memory:no Ad8 score is=0  MMSE - Mini Mental State Exam 08/05/2016  Orientation to time 5  Orientation to Place 5  Registration 3  Attention/ Calculation 5  Recall 3  Language- name 2 objects 2  Language- repeat 1  Language- follow 3 step command 3  Language- read & follow direction 1  Write a sentence 1  Copy design 0  Total score 29        Immunization History  Administered Date(s) Administered  . H1N1 12/28/2008  . Influenza Whole 10/20/2007, 10/18/2008, 11/15/2012  . Influenza-Unspecified 12/20/2013, 11/16/2016  . Pneumococcal Conjugate-13 04/23/2015  . Pneumococcal Polysaccharide-23 12/28/2008  . Tdap 08/01/2013   Screening Tests Health Maintenance  Topic Date Due  . MAMMOGRAM  05/29/2018  . INFLUENZA VACCINE  09/04/2019 (Originally 07/08/2018)  . TETANUS/TDAP  08/02/2023  . DEXA SCAN  Completed  . PNA vac Low Risk Adult  Completed      Plan:    Please schedule your next medicare wellness visit with me in 1 yr.  Continue to eat heart healthy diet (full of fruits, vegetables, whole grains, lean protein, water--limit salt, fat, and sugar intake) and increase physical activity as tolerated.  Bring a copy of your living will and/or healthcare power of attorney to your next office visit.    I have  personally reviewed and noted the following in the patient's chart:   . Medical and social history . Use of alcohol, tobacco or illicit drugs  . Current medications and supplements . Functional ability and status . Nutritional status . Physical activity . Advanced directives . List of other physicians . Hospitalizations, surgeries, and ER visits in previous 12 months . Vitals . Screenings to include cognitive, depression, and falls . Referrals and appointments  In addition, I have reviewed and discussed with patient certain preventive protocols, quality metrics, and best practice recommendations. A written personalized care plan for preventive services as well as general preventive health recommendations were provided to patient.     Naaman Plummer [place], [place]  09/03/2018

## 2018-09-03 ENCOUNTER — Encounter: Payer: Self-pay | Admitting: Family Medicine

## 2018-09-03 ENCOUNTER — Encounter: Payer: Self-pay | Admitting: *Deleted

## 2018-09-03 ENCOUNTER — Ambulatory Visit (INDEPENDENT_AMBULATORY_CARE_PROVIDER_SITE_OTHER): Payer: Medicare HMO | Admitting: Family Medicine

## 2018-09-03 ENCOUNTER — Ambulatory Visit (INDEPENDENT_AMBULATORY_CARE_PROVIDER_SITE_OTHER): Payer: Medicare HMO | Admitting: *Deleted

## 2018-09-03 VITALS — BP 132/72 | HR 69 | Ht 60.0 in | Wt 162.6 lb

## 2018-09-03 VITALS — BP 132/72 | HR 69 | Temp 98.0°F | Resp 16 | Ht 60.0 in | Wt 162.0 lb

## 2018-09-03 DIAGNOSIS — I10 Essential (primary) hypertension: Secondary | ICD-10-CM

## 2018-09-03 DIAGNOSIS — Z0000 Encounter for general adult medical examination without abnormal findings: Secondary | ICD-10-CM

## 2018-09-03 DIAGNOSIS — R197 Diarrhea, unspecified: Secondary | ICD-10-CM | POA: Diagnosis not present

## 2018-09-03 DIAGNOSIS — Z0001 Encounter for general adult medical examination with abnormal findings: Secondary | ICD-10-CM

## 2018-09-03 DIAGNOSIS — Z23 Encounter for immunization: Secondary | ICD-10-CM | POA: Diagnosis not present

## 2018-09-03 DIAGNOSIS — R7309 Other abnormal glucose: Secondary | ICD-10-CM | POA: Diagnosis not present

## 2018-09-03 DIAGNOSIS — E785 Hyperlipidemia, unspecified: Secondary | ICD-10-CM | POA: Diagnosis not present

## 2018-09-03 DIAGNOSIS — M810 Age-related osteoporosis without current pathological fracture: Secondary | ICD-10-CM | POA: Diagnosis not present

## 2018-09-03 NOTE — Progress Notes (Signed)
Reviewed  Yvonne R Lowne Chase, DO  

## 2018-09-03 NOTE — Patient Instructions (Addendum)
Please schedule your next medicare wellness visit with me in 1 yr.  Continue to eat heart healthy diet (full of fruits, vegetables, whole grains, lean protein, water--limit salt, fat, and sugar intake) and increase physical activity as tolerated.  Bring a copy of your living will and/or healthcare power of attorney to your next office visit.   Cindy Velasquez , Thank you for taking time to come for your Medicare Wellness Visit. I appreciate your ongoing commitment to your health goals. Please review the following plan we discussed and let me know if I can assist you in the future.   These are the goals we discussed: Goals    . Increase physical activity    . Increase water intake       This is a list of the screening recommended for you and due dates:  Health Maintenance  Topic Date Due  . Mammogram  05/29/2018  . Flu Shot  09/04/2019*  . Tetanus Vaccine  08/02/2023  . DEXA scan (bone density measurement)  Completed  . Pneumonia vaccines  Completed  *Topic was postponed. The date shown is not the original due date.    Health Maintenance for Postmenopausal Women Menopause is a normal process in which your reproductive ability comes to an end. This process happens gradually over a span of months to years, usually between the ages of 87 and 36. Menopause is complete when you have missed 12 consecutive menstrual periods. It is important to talk with your health care provider about some of the most common conditions that affect postmenopausal women, such as heart disease, cancer, and bone loss (osteoporosis). Adopting a healthy lifestyle and getting preventive care can help to promote your health and wellness. Those actions can also lower your chances of developing some of these common conditions. What should I know about menopause? During menopause, you may experience a number of symptoms, such as:  Moderate-to-severe hot flashes.  Night sweats.  Decrease in sex drive.  Mood  swings.  Headaches.  Tiredness.  Irritability.  Memory problems.  Insomnia.  Choosing to treat or not to treat menopausal changes is an individual decision that you make with your health care provider. What should I know about hormone replacement therapy and supplements? Hormone therapy products are effective for treating symptoms that are associated with menopause, such as hot flashes and night sweats. Hormone replacement carries certain risks, especially as you become older. If you are thinking about using estrogen or estrogen with progestin treatments, discuss the benefits and risks with your health care provider. What should I know about heart disease and stroke? Heart disease, heart attack, and stroke become more likely as you age. This may be due, in part, to the hormonal changes that your body experiences during menopause. These can affect how your body processes dietary fats, triglycerides, and cholesterol. Heart attack and stroke are both medical emergencies. There are many things that you can do to help prevent heart disease and stroke:  Have your blood pressure checked at least every 1-2 years. High blood pressure causes heart disease and increases the risk of stroke.  If you are 24-34 years old, ask your health care provider if you should take aspirin to prevent a heart attack or a stroke.  Do not use any tobacco products, including cigarettes, chewing tobacco, or electronic cigarettes. If you need help quitting, ask your health care provider.  It is important to eat a healthy diet and maintain a healthy weight. ? Be sure to include plenty  of vegetables, fruits, low-fat dairy products, and lean protein. ? Avoid eating foods that are high in solid fats, added sugars, or salt (sodium).  Get regular exercise. This is one of the most important things that you can do for your health. ? Try to exercise for at least 150 minutes each week. The type of exercise that you do should  increase your heart rate and make you sweat. This is known as moderate-intensity exercise. ? Try to do strengthening exercises at least twice each week. Do these in addition to the moderate-intensity exercise.  Know your numbers.Ask your health care provider to check your cholesterol and your blood glucose. Continue to have your blood tested as directed by your health care provider.  What should I know about cancer screening? There are several types of cancer. Take the following steps to reduce your risk and to catch any cancer development as early as possible. Breast Cancer  Practice breast self-awareness. ? This means understanding how your breasts normally appear and feel. ? It also means doing regular breast self-exams. Let your health care provider know about any changes, no matter how small.  If you are 90 or older, have a clinician do a breast exam (clinical breast exam or CBE) every year. Depending on your age, family history, and medical history, it may be recommended that you also have a yearly breast X-ray (mammogram).  If you have a family history of breast cancer, talk with your health care provider about genetic screening.  If you are at high risk for breast cancer, talk with your health care provider about having an MRI and a mammogram every year.  Breast cancer (BRCA) gene test is recommended for women who have family members with BRCA-related cancers. Results of the assessment will determine the need for genetic counseling and BRCA1 and for BRCA2 testing. BRCA-related cancers include these types: ? Breast. This occurs in males or females. ? Ovarian. ? Tubal. This may also be called fallopian tube cancer. ? Cancer of the abdominal or pelvic lining (peritoneal cancer). ? Prostate. ? Pancreatic.  Cervical, Uterine, and Ovarian Cancer Your health care provider may recommend that you be screened regularly for cancer of the pelvic organs. These include your ovaries, uterus,  and vagina. This screening involves a pelvic exam, which includes checking for microscopic changes to the surface of your cervix (Pap test).  For women ages 21-65, health care providers may recommend a pelvic exam and a Pap test every three years. For women ages 100-65, they may recommend the Pap test and pelvic exam, combined with testing for human papilloma virus (HPV), every five years. Some types of HPV increase your risk of cervical cancer. Testing for HPV may also be done on women of any age who have unclear Pap test results.  Other health care providers may not recommend any screening for nonpregnant women who are considered low risk for pelvic cancer and have no symptoms. Ask your health care provider if a screening pelvic exam is right for you.  If you have had past treatment for cervical cancer or a condition that could lead to cancer, you need Pap tests and screening for cancer for at least 20 years after your treatment. If Pap tests have been discontinued for you, your risk factors (such as having a new sexual partner) need to be reassessed to determine if you should start having screenings again. Some women have medical problems that increase the chance of getting cervical cancer. In these cases, your health  care provider may recommend that you have screening and Pap tests more often.  If you have a family history of uterine cancer or ovarian cancer, talk with your health care provider about genetic screening.  If you have vaginal bleeding after reaching menopause, tell your health care provider.  There are currently no reliable tests available to screen for ovarian cancer.  Lung Cancer Lung cancer screening is recommended for adults 75-72 years old who are at high risk for lung cancer because of a history of smoking. A yearly low-dose CT scan of the lungs is recommended if you:  Currently smoke.  Have a history of at least 30 pack-years of smoking and you currently smoke or have quit  within the past 15 years. A pack-year is smoking an average of one pack of cigarettes per day for one year.  Yearly screening should:  Continue until it has been 15 years since you quit.  Stop if you develop a health problem that would prevent you from having lung cancer treatment.  Colorectal Cancer  This type of cancer can be detected and can often be prevented.  Routine colorectal cancer screening usually begins at age 44 and continues through age 67.  If you have risk factors for colon cancer, your health care provider may recommend that you be screened at an earlier age.  If you have a family history of colorectal cancer, talk with your health care provider about genetic screening.  Your health care provider may also recommend using home test kits to check for hidden blood in your stool.  A small camera at the end of a tube can be used to examine your colon directly (sigmoidoscopy or colonoscopy). This is done to check for the earliest forms of colorectal cancer.  Direct examination of the colon should be repeated every 5-10 years until age 73. However, if early forms of precancerous polyps or small growths are found or if you have a family history or genetic risk for colorectal cancer, you may need to be screened more often.  Skin Cancer  Check your skin from head to toe regularly.  Monitor any moles. Be sure to tell your health care provider: ? About any new moles or changes in moles, especially if there is a change in a mole's shape or color. ? If you have a mole that is larger than the size of a pencil eraser.  If any of your family members has a history of skin cancer, especially at a young age, talk with your health care provider about genetic screening.  Always use sunscreen. Apply sunscreen liberally and repeatedly throughout the day.  Whenever you are outside, protect yourself by wearing long sleeves, pants, a wide-brimmed hat, and sunglasses.  What should I know  about osteoporosis? Osteoporosis is a condition in which bone destruction happens more quickly than new bone creation. After menopause, you may be at an increased risk for osteoporosis. To help prevent osteoporosis or the bone fractures that can happen because of osteoporosis, the following is recommended:  If you are 93-87 years old, get at least 1,000 mg of calcium and at least 600 mg of vitamin D per day.  If you are older than age 8 but younger than age 24, get at least 1,200 mg of calcium and at least 600 mg of vitamin D per day.  If you are older than age 88, get at least 1,200 mg of calcium and at least 800 mg of vitamin D per day.  Smoking  and excessive alcohol intake increase the risk of osteoporosis. Eat foods that are rich in calcium and vitamin D, and do weight-bearing exercises several times each week as directed by your health care provider. What should I know about how menopause affects my mental health? Depression may occur at any age, but it is more common as you become older. Common symptoms of depression include:  Low or sad mood.  Changes in sleep patterns.  Changes in appetite or eating patterns.  Feeling an overall lack of motivation or enjoyment of activities that you previously enjoyed.  Frequent crying spells.  Talk with your health care provider if you think that you are experiencing depression. What should I know about immunizations? It is important that you get and maintain your immunizations. These include:  Tetanus, diphtheria, and pertussis (Tdap) booster vaccine.  Influenza every year before the flu season begins.  Pneumonia vaccine.  Shingles vaccine.  Your health care provider may also recommend other immunizations. This information is not intended to replace advice given to you by your health care provider. Make sure you discuss any questions you have with your health care provider. Document Released: 01/16/2006 Document Revised: 06/13/2016  Document Reviewed: 08/28/2015 Elsevier Interactive Patient Education  2018 [employer].

## 2018-09-03 NOTE — Progress Notes (Addendum)
Subjective:     Cindy Velasquez is a 77 y.o. female and is here for a comprehensive physical exam. The patient reports some loose stools on and off for a long time   No abd pain / cramping.     Social History   Socioeconomic History  . Marital status: Married    Spouse name: Zamariya Neal  . Number of children: 4  . Years of education: Not on file  . Highest education level: Not on file  Occupational History  . Occupation: retired--[employer]  Social Needs  . Financial resource strain: Not on file  . Food insecurity:    Worry: Not on file    Inability: Not on file  . Transportation needs:    Medical: Not on file    Non-medical: Not on file  Tobacco Use  . Smoking status: Former Smoker    Packs/day: 0.50    Years: 30.00    Pack years: 15.00    Types: Cigarettes    Last attempt to quit: 06/02/1991    Years since quitting: 27.3  . Smokeless tobacco: Never Used  Substance and Sexual Activity  . Alcohol use: Yes    Alcohol/week: 5.0 standard drinks    Types: 5 Glasses of wine per week  . Drug use: No  . Sexual activity: Not Currently    Partners: Male  Lifestyle  . Physical activity:    Days per week: Not on file    Minutes per session: Not on file  . Stress: Not on file  Relationships  . Social connections:    Talks on phone: Not on file    Gets together: Not on file    Attends religious service: Not on file    Active member of club or organization: Not on file    Attends meetings of clubs or organizations: Not on file    Relationship status: Not on file  . Intimate partner violence:    Fear of current or ex partner: Not on file    Emotionally abused: Not on file    Physically abused: Not on file    Forced sexual activity: Not on file  Other Topics Concern  . Not on file  Social History Narrative   Exercise--  [employer]--yoga and cardio 3x a week   Health Maintenance  Topic Date Due  . MAMMOGRAM  05/29/2018  . TETANUS/TDAP  08/02/2023  .  INFLUENZA VACCINE  Completed  . DEXA SCAN  Completed  . PNA vac Low Risk Adult  Completed    The following portions of the patient's history were reviewed and updated as appropriate:  She  has a past medical history of Adenomatous colon polyp, Cancer ([place]) (03/2015), Diverticulosis of colon, GERD (gastroesophageal reflux disease), Hip pain, Hyperlipidemia, Hyperplastic colon polyp, Hypertension, Lactose intolerance, Loose stools, Lower back pain, Osteoporosis, and Palpitations. She does not have any pertinent problems on file. She  has a past surgical history that includes Tubal ligation; Hammer toe surgery; Rotator cuff repair; and Eye surgery (2012). Her family history includes Cancer (age of onset: 37) in her brother; Cancer (age of onset: 83) in her mother; Heart disease in her brother and father; Hyperlipidemia in her mother; Hypertension in her brother, brother, father, and mother; Obesity in her mother; Stroke in her father. She  reports that she quit smoking about 27 years ago. Her smoking use included cigarettes. She has a 15.00 pack-year smoking history. She has never used smokeless tobacco. She reports that she drinks  about 5.0 standard drinks of alcohol per week. She reports that she does not use drugs. She has a current medication list which includes the following prescription(s): diphenhydramine-acetaminophen, hydrochlorothiazide, lisinopril, icaps areds 2, rosuvastatin, and vitamin d (ergocalciferol). Current Outpatient Medications on File Prior to Visit  Medication Sig Dispense Refill  . hydrochlorothiazide (HYDRODIURIL) 25 MG tablet TAKE 1/2 TABLET BY MOUTH EVERY MORNING 45 tablet 1  . lisinopril (PRINIVIL,ZESTRIL) 20 MG tablet take 1 tablet by mouth once daily 90 tablet 1  . Multiple Vitamins-Minerals (ICAPS AREDS 2) CAPS Take 2 capsules by mouth daily.     . rosuvastatin (CRESTOR) 20 MG tablet TAKE 1 TABLET BY MOUTH EVERY DAY 90 tablet 1   No current facility-administered  medications on file prior to visit.    She has No Known Allergies..  Review of Systems Pertinent items are noted in HPI.  Review of Systems  Constitutional: Negative for activity change, appetite change and fatigue.  HENT: Negative for hearing loss, congestion, tinnitus and ear discharge.  dentist q67m Eyes: Negative for visual disturbance (see optho q1y -- vision corrected to 20/20 with glasses).  Respiratory: Negative for cough, chest tightness and shortness of breath.   Cardiovascular: Negative for chest pain, palpitations and leg swelling.  Gastrointestinal: Negative for abdominal pain, , constipation and abdominal distention. + loose stools Genitourinary: Negative for urgency, frequency, decreased urine volume and difficulty urinating.  Musculoskeletal: Negative for back pain, arthralgias and gait problem.  Skin: Negative for color change, pallor and rash.  Neurological: Negative for dizziness, light-headedness, numbness and headaches.  Hematological: Negative for adenopathy. Does not bruise/bleed easily.  Psychiatric/Behavioral: Negative for suicidal ideas, confusion, sleep disturbance, self-injury, dysphoric mood, decreased concentration and agitation.      Objective:    BP 132/72   Pulse 69   Temp 98 F (36.7 C) (Oral)   Resp 16   Ht 5' (1.524 m)   Wt 162 lb (73.5 kg)   SpO2 95%   BMI 31.64 kg/m  General appearance: alert, cooperative, appears stated age and no distress Head: Normocephalic, without obvious abnormality, atraumatic Eyes: conjunctivae/corneas clear. PERRL, EOM's intact. Fundi benign. Ears: normal TM's and external ear canals both ears Nose: Nares normal. Septum midline. Mucosa normal. No drainage or sinus tenderness. Throat: lips, mucosa, and tongue normal; teeth and gums normal Neck: no adenopathy, no carotid bruit, no JVD, supple, symmetrical, trachea midline and thyroid not enlarged, symmetric, no tenderness/mass/nodules Back: symmetric, no curvature.  ROM normal. No CVA tenderness. Lungs: clear to auscultation bilaterally Breasts: normal appearance, no masses or tenderness Heart: regular rate and rhythm, S1, S2 normal, no murmur, click, rub or gallop Abdomen: soft, non-tender; bowel sounds normal; no masses,  no organomegaly Pelvic: not indicated; post-menopausal, no abnormal Pap smears in past Extremities: extremities normal, atraumatic, no cyanosis or edema Pulses: 2+ and symmetric Skin: Skin color, texture, turgor normal. No rashes or lesions Lymph nodes: Cervical, supraclavicular, and axillary nodes normal. Neurologic: Alert and oriented X 3, normal strength and tone. Normal symmetric reflexes. Normal coordination and gait    Assessment:    Healthy female exam.      Plan:    ghm utd Check labs  See After Visit Summary for Counseling Recommendations    1. Diarrhea, unspecified type Really loose stools-- on and off for a long time per pt  - Ambulatory referral to Gastroenterology - CBC with Differential/Platelet - Comprehensive metabolic panel - Hemoglobin A1c - Insulin, Free (Bioactive)-([facility]) - Lipid panel - Thyroid Panel With TSH -  Vitamin D (25 hydroxy)  2. Essential hypertension Well controlled, no changes to meds. Encouraged heart healthy diet such as the DASH diet and exercise as tolerated.  - CBC with Differential/Platelet - Comprehensive metabolic panel - Lipid panel - Thyroid Panel With TSH - Vitamin D (25 hydroxy)  3. Hyperlipidemia LDL goal <100 Encouraged heart healthy diet, increase exercise, avoid trans fats, consider a krill oil cap daily - CBC with Differential/Platelet - Comprehensive metabolic panel - Lipid panel - Thyroid Panel With TSH - Vitamin D (25 hydroxy)  4. Morbid obesity ([place])  - Hemoglobin A1c - Insulin, Free (Bioactive)-([facility])  5. Influenza vaccine administered  - Flu vaccine HIGH DOSE PF (Fluzone High Dose)

## 2018-09-03 NOTE — Patient Instructions (Signed)
Preventive Care 65 Years and Older, Female Preventive care refers to lifestyle choices and visits with your health care provider that can promote health and wellness. What does preventive care include?  A yearly physical exam. This is also called an annual well check.  Dental exams once or twice a year.  Routine eye exams. Ask your health care provider how often you should have your eyes checked.  Personal lifestyle choices, including: ? Daily care of your teeth and gums. ? Regular physical activity. ? Eating a healthy diet. ? Avoiding tobacco and drug use. ? Limiting alcohol use. ? Practicing safe sex. ? Taking low-dose aspirin every day. ? Taking vitamin and mineral supplements as recommended by your health care provider. What happens during an annual well check? The services and screenings done by your health care provider during your annual well check will depend on your age, overall health, lifestyle risk factors, and family history of disease. Counseling Your health care provider may ask you questions about your:  Alcohol use.  Tobacco use.  Drug use.  Emotional well-being.  Home and relationship well-being.  Sexual activity.  Eating habits.  History of falls.  Memory and ability to understand (cognition).  Work and work environment.  Reproductive health.  Screening You may have the following tests or measurements:  Height, weight, and BMI.  Blood pressure.  Lipid and cholesterol levels. These may be checked every 5 years, or more frequently if you are over 50 years old.  Skin check.  Lung cancer screening. You may have this screening every year starting at age 55 if you have a 30-pack-year history of smoking and currently smoke or have quit within the past 15 years.  Fecal occult blood test (FOBT) of the stool. You may have this test every year starting at age 50.  Flexible sigmoidoscopy or colonoscopy. You may have a sigmoidoscopy every 5 years or  a colonoscopy every 10 years starting at age 50.  Hepatitis C blood test.  Hepatitis B blood test.  Sexually transmitted disease (STD) testing.  Diabetes screening. This is done by checking your blood sugar (glucose) after you have not eaten for a while (fasting). You may have this done every 1-3 years.  Bone density scan. This is done to screen for osteoporosis. You may have this done starting at age 65.  Mammogram. This may be done every 1-2 years. Talk to your health care provider about how often you should have regular mammograms.  Talk with your health care provider about your test results, treatment options, and if necessary, the need for more tests. Vaccines Your health care provider may recommend certain vaccines, such as:  Influenza vaccine. This is recommended every year.  Tetanus, diphtheria, and acellular pertussis (Tdap, Td) vaccine. You may need a Td booster every 10 years.  Varicella vaccine. You may need this if you have not been vaccinated.  Zoster vaccine. You may need this after age 60.  Measles, mumps, and rubella (MMR) vaccine. You may need at least one dose of MMR if you were born in 1957 or later. You may also need a second dose.  Pneumococcal 13-valent conjugate (PCV13) vaccine. One dose is recommended after age 65.  Pneumococcal polysaccharide (PPSV23) vaccine. One dose is recommended after age 65.  Meningococcal vaccine. You may need this if you have certain conditions.  Hepatitis A vaccine. You may need this if you have certain conditions or if you travel or work in places where you may be exposed to hepatitis   A.  Hepatitis B vaccine. You may need this if you have certain conditions or if you travel or work in places where you may be exposed to hepatitis B.  Haemophilus influenzae type b (Hib) vaccine. You may need this if you have certain conditions.  Talk to your health care provider about which screenings and vaccines you need and how often you  need them. This information is not intended to replace advice given to you by your health care provider. Make sure you discuss any questions you have with your health care provider. Document Released: 12/21/2015 Document Revised: 08/13/2016 Document Reviewed: 09/25/2015 Elsevier Interactive Patient Education  2018 [employer]  

## 2018-09-06 ENCOUNTER — Encounter (INDEPENDENT_AMBULATORY_CARE_PROVIDER_SITE_OTHER): Payer: Self-pay | Admitting: Family Medicine

## 2018-09-06 ENCOUNTER — Other Ambulatory Visit (INDEPENDENT_AMBULATORY_CARE_PROVIDER_SITE_OTHER): Payer: Self-pay | Admitting: Family Medicine

## 2018-09-06 ENCOUNTER — Ambulatory Visit: Payer: Medicare HMO | Admitting: *Deleted

## 2018-09-06 ENCOUNTER — Ambulatory Visit (INDEPENDENT_AMBULATORY_CARE_PROVIDER_SITE_OTHER): Payer: Medicare HMO | Admitting: Family Medicine

## 2018-09-06 VITALS — BP 146/76 | HR 73 | Temp 98.1°F | Ht 60.0 in | Wt 158.0 lb

## 2018-09-06 DIAGNOSIS — E669 Obesity, unspecified: Secondary | ICD-10-CM | POA: Diagnosis not present

## 2018-09-06 DIAGNOSIS — R5383 Other fatigue: Secondary | ICD-10-CM

## 2018-09-06 DIAGNOSIS — Z6830 Body mass index (BMI) 30.0-30.9, adult: Secondary | ICD-10-CM | POA: Diagnosis not present

## 2018-09-06 DIAGNOSIS — R7303 Prediabetes: Secondary | ICD-10-CM | POA: Diagnosis not present

## 2018-09-06 DIAGNOSIS — Z0289 Encounter for other administrative examinations: Secondary | ICD-10-CM

## 2018-09-06 DIAGNOSIS — E559 Vitamin D deficiency, unspecified: Secondary | ICD-10-CM

## 2018-09-06 DIAGNOSIS — Z1331 Encounter for screening for depression: Secondary | ICD-10-CM

## 2018-09-06 DIAGNOSIS — R0602 Shortness of breath: Secondary | ICD-10-CM

## 2018-09-06 MED ORDER — VITAMIN D (ERGOCALCIFEROL) 1.25 MG (50000 UNIT) PO CAPS: 50000.0000 [IU] | ORAL_CAPSULE | ORAL | 0 refills | Status: DC

## 2018-09-06 NOTE — Progress Notes (Signed)
Office: [phone]  /  Fax: [phone]   Dear Dr. Carollee Herter,   Thank you for referring Ilda Mori to our clinic. The following note includes my evaluation and treatment recommendations.  HPI:   Chief Complaint: [place] has been referred by Ann Held, DO for consultation regarding her obesity and obesity related comorbidities.    Cindy Velasquez (MR# [id number]) is a 77 y.o. female who presents on 09/06/2018 for obesity evaluation and treatment. Current BMI is Body mass index is 30.86 kg/m.Marland Kitchen Ankita has been struggling with her weight for many years and has been unsuccessful in either losing weight, maintaining weight loss, or reaching her healthy weight goal.     Archie Patten attended our information session and states she is currently in the action stage of change and ready to dedicate time achieving and maintaining a healthier weight. Caragh is interested in becoming our patient and working on intensive lifestyle modifications including (but not limited to) diet, exercise and weight loss.    Eris states her family eats meals together she thinks her family will eat healthier with  her her desired weight loss is 18 lbs she started gaining weight 19 + years old her heaviest weight ever was 160 lbs she has significant food cravings issues  she snacks frequently in the evenings she skips meals frequently she is frequently drinking liquids with calories she frequently makes poor food choices she frequently eats larger portions than normal  she struggles with emotional eating    Fatigue Reda feels her energy is lower than it should be. This has worsened with weight gain and has not worsened recently. Abuk admits to daytime somnolence and  admits to waking up still tired. Patient is at risk for obstructive sleep apnea. Patent has a history of symptoms of daytime fatigue. Patient generally gets 6 hours of sleep per night, and states they generally have  nightime awakenings. Snoring is present. Apneic episodes are not present. Epworth Sleepiness Score is 6.  Dyspnea on exertion Archie Patten notes increasing shortness of breath with exercising and seems to be worsening over time with weight gain. She notes getting out of breath sooner with activity than she used to. This has not gotten worse recently. Ferrin denies orthopnea.  Pre-Diabetes Ineta has a diagnosis of pre-diabetes based on her elevated Hgb A1c at 5.8 and was informed this puts her at greater risk of developing diabetes. She is not on metformin and would like to try to diet control. She admits to polyphagia.  Vitamin D Deficiency Kaylei has a diagnosis of vitamin D deficiency. She is not currently taking Vit D, and level is very low at 23. She notes fatigue and denies nausea, vomiting or muscle weakness.  Depression Screen Berry's Food and Mood (modified PHQ-9) score was  Depression screen PHQ 2/9 09/06/2018  Decreased Interest 1  Down, Depressed, Hopeless 1  PHQ - 2 Score 2  Altered sleeping 1  Tired, decreased energy 2  Change in appetite 1  Feeling bad or failure about yourself  1  Trouble concentrating 0  Moving slowly or fidgety/restless 0  Suicidal thoughts 0  PHQ-9 Score 7  Difficult doing work/chores Not difficult at all    ALLERGIES: No Known Allergies  MEDICATIONS: Current Outpatient Medications on File Prior to Visit  Medication Sig Dispense Refill  . diphenhydramine-acetaminophen (TYLENOL PM) 25-500 MG TABS tablet Take 1 tablet by mouth at bedtime.    . hydrochlorothiazide (HYDRODIURIL) 25 MG tablet TAKE 1/2  TABLET BY MOUTH EVERY MORNING 45 tablet 1  . lisinopril (PRINIVIL,ZESTRIL) 20 MG tablet take 1 tablet by mouth once daily 90 tablet 1  . Multiple Vitamins-Minerals (ICAPS AREDS 2) CAPS Take 2 capsules by mouth daily.     . rosuvastatin (CRESTOR) 20 MG tablet TAKE 1 TABLET BY MOUTH EVERY DAY 90 tablet 1   No current facility-administered medications on  file prior to visit.     PAST MEDICAL HISTORY: Past Medical History:  Diagnosis Date  . Adenomatous colon polyp   . Cancer ([place]) 03/2015   BCC R tibia   . Diverticulosis of colon   . GERD (gastroesophageal reflux disease)   . Hip pain   . Hyperlipidemia   . Hyperplastic colon polyp   . Hypertension   . Lactose intolerance   . Loose stools   . Lower back pain   . Osteoporosis   . Palpitations     PAST SURGICAL HISTORY: Past Surgical History:  Procedure Laterality Date  . EYE SURGERY  2012   B/L 01/2011  02/2011  . HAMMER TOE SURGERY     Bilateral  . ROTATOR CUFF REPAIR     Left  . TUBAL LIGATION      SOCIAL HISTORY: Social History   Tobacco Use  . Smoking status: Former Smoker    Packs/day: 0.50    Years: 30.00    Pack years: 15.00    Types: Cigarettes    Last attempt to quit: 06/02/1991    Years since quitting: 27.2  . Smokeless tobacco: Never Used  Substance Use Topics  . Alcohol use: Yes    Alcohol/week: 5.0 standard drinks    Types: 5 Glasses of wine per week  . Drug use: No    FAMILY HISTORY: Family History  Problem Relation Age of Onset  . Hypertension Mother   . Cancer Mother 53       ?  Marland Kitchen Hyperlipidemia Mother   . Obesity Mother   . Hypertension Father   . Stroke Father   . Heart disease Father   . Hypertension Brother   . Cancer Brother 35       testicular   . Hypertension Brother   . Heart disease Brother        quadruple bypass    ROS: Review of Systems  Constitutional: Positive for malaise/fatigue. Negative for weight loss.       + Trouble sleeping  Eyes:       + Vision changes + Wear glasses or contacts + Floaters  Respiratory: Positive for shortness of breath (with exertion).   Cardiovascular: Negative for orthopnea.       + Very cold feet or hands  Gastrointestinal: Positive for diarrhea.  Genitourinary: Positive for frequency.  Musculoskeletal: Positive for neck pain.       Negative muscle weakness + Muscle or joint  pain  Skin:       + Dryness  Endo/Heme/Allergies: Bruises/bleeds easily.       Positive polyphagia    PHYSICAL EXAM: Blood pressure (!) 146/76, pulse 73, temperature 98.1 F (36.7 C), temperature source Oral, height 5' (1.524 m), weight 158 lb (71.7 kg), SpO2 99 %. Body mass index is 30.86 kg/m. Physical Exam  Constitutional: She is oriented to person, place, and time. She appears well-developed and well-nourished.  HENT:  Head: Normocephalic and atraumatic.  Nose: Nose normal.  Eyes: EOM are normal. No scleral icterus.  Neck: Normal range of motion. Neck supple. No thyromegaly present.  Cardiovascular:  Normal rate and regular rhythm.  Pulmonary/Chest: Effort normal. No respiratory distress.  Abdominal: Soft. There is no tenderness.  + Obesity  Musculoskeletal:  Range of Motion normal in all 4 extremities Trace edema noted in bilateral lower extremities  Neurological: She is alert and oriented to person, place, and time. Coordination normal.  Skin: Skin is warm and dry.  Psychiatric: She has a normal mood and affect. Her behavior is normal.  Vitals reviewed.   RECENT LABS AND TESTS: BMET    Component Value Date/Time   NA 140 09/03/2018 1452   K 4.2 09/03/2018 1452   CL 101 09/03/2018 1452   CO2 30 09/03/2018 1452   GLUCOSE 83 09/03/2018 1452   BUN 17 09/03/2018 1452   CREATININE 0.63 09/03/2018 1452   CALCIUM 10.1 09/03/2018 1452   GFRNONAA 75.66 04/03/2010 1516   GFRAA 107 12/28/2008 0000   Lab Results  Component Value Date   HGBA1C 5.8 (H) 09/03/2018   No results found for: INSULIN CBC    Component Value Date/Time   WBC 7.9 09/03/2018 1452   RBC 4.97 09/03/2018 1452   HGB 14.5 09/03/2018 1452   HCT 43.8 09/03/2018 1452   PLT 240 09/03/2018 1452   MCV 88.1 09/03/2018 1452   MCH 29.2 09/03/2018 1452   MCHC 33.1 09/03/2018 1452   RDW 13.1 09/03/2018 1452   LYMPHSABS 2,623 09/03/2018 1452   MONOABS 1.0 06/17/2018 1722   EOSABS 498 09/03/2018 1452    BASOSABS 79 09/03/2018 1452   Iron/TIBC/Ferritin/ %Sat No results found for: IRON, TIBC, FERRITIN, IRONPCTSAT Lipid Panel     Component Value Date/Time   CHOL 179 09/03/2018 1452   TRIG 208 (H) 09/03/2018 1452   HDL 41 (L) 09/03/2018 1452   CHOLHDL 4.4 09/03/2018 1452   VLDL 32.6 06/17/2018 1722   LDLCALC 106 (H) 09/03/2018 1452   LDLDIRECT 129.1 08/23/2007 0913   Hepatic Function Panel     Component Value Date/Time   PROT 7.5 09/03/2018 1452   ALBUMIN 4.1 06/17/2018 1722   AST 24 09/03/2018 1452   ALT 22 09/03/2018 1452   ALKPHOS 73 06/17/2018 1722   BILITOT 0.5 09/03/2018 1452   BILIDIR 0.1 02/19/2015 0847      Component Value Date/Time   TSH 1.75 09/03/2018 1452   TSH 1.95 06/17/2018 1722   TSH 1.71 07/02/2017 1109    ECG  shows NSR with a rate of 76 BPM INDIRECT CALORIMETER done today shows a VO2 of 192 and a REE of 1343.  Her calculated basal metabolic rate is 9024 thus her basal metabolic rate is better than expected.    ASSESSMENT AND PLAN: Other fatigue - Plan: EKG 12-Lead  Shortness of breath on exertion  Prediabetes  Vitamin D deficiency - Plan: Vitamin D, Ergocalciferol, (DRISDOL) 50000 units CAPS capsule  Depression screening  Class 1 obesity with serious comorbidity and body mass index (BMI) of 30.0 to 30.9 in adult, unspecified obesity type  PLAN:  Fatigue Kelce was informed that her fatigue may be related to obesity, depression or many other causes. Labs will be ordered, and in the meanwhile Nataleah has agreed to work on diet, exercise and weight loss to help with fatigue. Proper sleep hygiene was discussed including the need for 7-8 hours of quality sleep each night. A sleep study was not ordered based on symptoms and Epworth score.  Dyspnea on exertion Azka's shortness of breath appears to be obesity related and exercise induced. She has agreed to work on weight loss  and gradually increase exercise to treat her exercise induced shortness of  breath. If Celie follows our instructions and loses weight without improvement of her shortness of breath, we will plan to refer to pulmonology. We will monitor this condition regularly. Blue agrees to this plan.  Pre-Diabetes Gunhild will start diet prescription and will continue to work on weight loss, exercise, and decreasing simple carbohydrates in her diet to help decrease the risk of diabetes. We dicussed metformin including benefits and risks. She was informed that eating too many simple carbohydrates or too many calories at one sitting increases the likelihood of GI side effects. [name] declined metformin for now and a prescription was not written today. We will recheck labs in 3 months. Hilarie agrees to follow up with our clinic in 2 weeks as directed to monitor her progress.  Vitamin D Deficiency Merie was informed that low vitamin D levels contributes to fatigue and are associated with obesity, breast, and colon cancer. Rubyann agrees to start prescription Vit D @50 ,000 IU every week #4 with no refills. She will follow up for routine testing of vitamin D, at least 2-3 times per year. She was informed of the risk of over-replacement of vitamin D and agrees to not increase her dose unless she discusses this with [place] first. We will recheck labs in 3 months. Courtnei agrees to follow up with our clinic in 2 weeks.  Depression Screen Chance had a mildly positive depression screening. Depression is commonly associated with obesity and often results in emotional eating behaviors. We will monitor this closely and work on CBT to help improve the non-hunger eating patterns. Referral to Psychology may be required if no improvement is seen as she continues in our clinic.  Obesity Maryn is currently in the action stage of change and her goal is to continue with weight loss efforts. I recommend Daysi begin the structured treatment plan as follows:  She has agreed to follow the Category 2 plan Marzetta has  been instructed to eventually work up to a goal of 150 minutes of combined cardio and strengthening exercise per week for weight loss and overall health benefits. We discussed the following Behavioral Modification Strategies today: increasing lean protein intake, decreasing simple carbohydrates  and work on meal planning and easy cooking plans   She was informed of the importance of frequent follow up visits to maximize her success with intensive lifestyle modifications for her multiple health conditions. She was informed we would discuss her lab results at her next visit unless there is a critical issue that needs to be addressed sooner. Illyria agreed to keep her next visit at the agreed upon time to discuss these results.    OBESITY BEHAVIORAL INTERVENTION VISIT  Today's visit was # 1   Starting weight: 158 lbs Starting date: 09/06/18 Today's weight : 158 lbs  Today's date: 09/06/2018 Total lbs lost to date: 0 At least 15 minutes were spent on discussing the following behavioral intervention visit.   ASK: We discussed the diagnosis of obesity with Ilda Mori today and Micalah agreed to give [place] permission to discuss obesity behavioral modification therapy today.  ASSESS: Minka has the diagnosis of obesity and her BMI today is 30.86 Haset is in the action stage of change   ADVISE: Gift was educated on the multiple health risks of obesity as well as the benefit of weight loss to improve her health. She was advised of the need for long term treatment and the importance of lifestyle modifications  to improve her current health and to decrease her risk of future health problems.  AGREE: Multiple dietary modification options and treatment options were discussed and  Koralynn agreed to follow the recommendations documented in the above note.  ARRANGE: Porsha was educated on the importance of frequent visits to treat obesity as outlined per CMS and USPSTF guidelines and agreed to schedule  her next follow up appointment today.  I, Trixie Dredge, am acting as transcriptionist for Dennard Nip, MD  I have reviewed the above documentation for accuracy and completeness, and I agree with the above. -Dennard Nip, MD

## 2018-09-07 ENCOUNTER — Encounter: Payer: Self-pay | Admitting: Family Medicine

## 2018-09-08 ENCOUNTER — Encounter: Payer: Self-pay | Admitting: Internal Medicine

## 2018-09-09 NOTE — Addendum Note (Signed)
Addended byDamita Dunnings D on: 09/09/2018 01:52 PM   Modules accepted: Orders

## 2018-09-10 LAB — LIPID PANEL
Cholesterol: 179 mg/dL (ref <200)
HDL: 41 mg/dL — ABNORMAL LOW (ref >50)
LDL Cholesterol (Calc): 106 mg/dL (calc) — ABNORMAL HIGH
Non-HDL Cholesterol (Calc): 138 mg/dL (calc) — ABNORMAL HIGH (ref <130)
TRIGLYCERIDES: 208 mg/dL — AB (ref <150)
Total CHOL/HDL Ratio: 4.4 (calc) (ref <5.0)

## 2018-09-10 LAB — COMPREHENSIVE METABOLIC PANEL
AG RATIO: 1.2 (calc) (ref 1.0–2.5)
ALT: 22 U/L (ref 6–29)
AST: 24 U/L (ref 10–35)
Albumin: 4.1 g/dL (ref 3.6–5.1)
Alkaline phosphatase (APISO): 77 U/L (ref 33–130)
BILIRUBIN TOTAL: 0.5 mg/dL (ref 0.2–1.2)
BUN: 17 mg/dL (ref 7–25)
CALCIUM: 10.1 mg/dL (ref 8.6–10.4)
CHLORIDE: 101 mmol/L (ref 98–110)
CO2: 30 mmol/L (ref 20–32)
Creat: 0.63 mg/dL (ref 0.60–0.93)
GLOBULIN: 3.4 g/dL (ref 1.9–3.7)
Glucose, Bld: 83 mg/dL (ref 65–99)
Potassium: 4.2 mmol/L (ref 3.5–5.3)
SODIUM: 140 mmol/L (ref 135–146)
Total Protein: 7.5 g/dL (ref 6.1–8.1)

## 2018-09-10 LAB — CBC WITH DIFFERENTIAL/PLATELET
BASOS ABS: 79 {cells}/uL (ref 0–200)
Basophils Relative: 1 %
EOS ABS: 498 {cells}/uL (ref 15–500)
Eosinophils Relative: 6.3 %
HEMATOCRIT: 43.8 % (ref 35.0–45.0)
HEMOGLOBIN: 14.5 g/dL (ref 11.7–15.5)
LYMPHS ABS: 2623 {cells}/uL (ref 850–3900)
MCH: 29.2 pg (ref 27.0–33.0)
MCHC: 33.1 g/dL (ref 32.0–36.0)
MCV: 88.1 fL (ref 80.0–100.0)
MPV: 11.6 fL (ref 7.5–12.5)
Monocytes Relative: 10 %
NEUTROS ABS: 3911 {cells}/uL (ref 1500–7800)
Neutrophils Relative %: 49.5 %
Platelets: 240 10*3/uL (ref 140–400)
RBC: 4.97 10*6/uL (ref 3.80–5.10)
RDW: 13.1 % (ref 11.0–15.0)
Total Lymphocyte: 33.2 %
WBC: 7.9 10*3/uL (ref 3.8–10.8)
WBCMIX: 790 {cells}/uL (ref 200–950)

## 2018-09-10 LAB — HEMOGLOBIN A1C
EAG (MMOL/L): 6.6 (calc)
HEMOGLOBIN A1C: 5.8 %{Hb} — AB (ref <5.7)
MEAN PLASMA GLUCOSE: 120 (calc)

## 2018-09-10 LAB — INSULIN, FREE (BIOACTIVE): Insulin, Free: 10.4 u[IU]/mL (ref 1.5–14.9)

## 2018-09-10 LAB — THYROID PANEL WITH TSH
FREE THYROXINE INDEX: 3 (ref 1.4–3.8)
T3 UPTAKE: 31 % (ref 22–35)
T4, Total: 9.7 ug/dL (ref 5.1–11.9)
TSH: 1.75 m[IU]/L (ref 0.40–4.50)

## 2018-09-10 LAB — VITAMIN D 25 HYDROXY (VIT D DEFICIENCY, FRACTURES): VIT D 25 HYDROXY: 23 ng/mL — AB (ref 30–100)

## 2018-09-15 DIAGNOSIS — Z0000 Encounter for general adult medical examination without abnormal findings: Secondary | ICD-10-CM | POA: Insufficient documentation

## 2018-09-22 ENCOUNTER — Ambulatory Visit (INDEPENDENT_AMBULATORY_CARE_PROVIDER_SITE_OTHER): Payer: Medicare HMO | Admitting: Family Medicine

## 2018-09-22 VITALS — BP 127/65 | HR 61 | Temp 98.0°F | Ht 60.0 in | Wt 154.0 lb

## 2018-09-22 DIAGNOSIS — E669 Obesity, unspecified: Secondary | ICD-10-CM

## 2018-09-22 DIAGNOSIS — R7303 Prediabetes: Secondary | ICD-10-CM | POA: Diagnosis not present

## 2018-09-22 DIAGNOSIS — E7849 Other hyperlipidemia: Secondary | ICD-10-CM

## 2018-09-22 DIAGNOSIS — Z6830 Body mass index (BMI) 30.0-30.9, adult: Secondary | ICD-10-CM

## 2018-09-22 DIAGNOSIS — E559 Vitamin D deficiency, unspecified: Secondary | ICD-10-CM

## 2018-09-22 MED ORDER — VITAMIN D (ERGOCALCIFEROL) 1.25 MG (50000 UNIT) PO CAPS
50000.0000 [IU] | ORAL_CAPSULE | ORAL | 0 refills | Status: DC
Start: 2018-09-22 — End: 2018-11-02

## 2018-09-27 NOTE — Progress Notes (Signed)
Office: [phone]  /  Fax: [phone]   HPI:   Chief Complaint: OBESITY Cindy Velasquez is here to discuss her progress with her obesity treatment plan. She is on the Category 2 plan and is following her eating plan approximately 85-90 % of the time. She states she is doing stretches, balance, cardio, and tichai for 60 minutes 3 times per week. Cindy Velasquez did well with weight loss on her Category 2 plan. She notes her hunger was controlled and she struggled to eat all her foods. She was out of town some of the time but was still mindful of her food choices.  Her weight is 154 lb (69.9 kg) today and has had a weight loss of 4 pounds over a period of 2 weeks since her last visit. She has lost 4 lbs since starting treatment with [place].  Hyperlipidemia Cindy Velasquez has hyperlipidemia and she is attempting to control her cholesterol levels with intensive lifestyle modification including a low saturated fat diet, exercise, and weight loss in addition to Crestor. She denies any chest pain, claudication or myalgias.  Vitamin D Deficiency Cindy Velasquez has a diagnosis of vitamin D deficiency. She is currently taking prescription Vit D, but level is not at goal. She notes fatigue and denies nausea, vomiting or muscle weakness.  Pre-Diabetes Cindy Velasquez has a diagnosis of pre-diabetes based on her elevated Hgb A1c at 5.8 and fasting insulin elevated at 10. She was informed this puts her at greater risk of developing diabetes. She is not taking metformin currently and she is working on diet and doing well with exercise to decrease risk of diabetes. She denies hypoglycemia.  ALLERGIES: No Known Allergies  MEDICATIONS: Current Outpatient Medications on File Prior to Visit  Medication Sig Dispense Refill  . diphenhydramine-acetaminophen (TYLENOL PM) 25-500 MG TABS tablet Take 1 tablet by mouth at bedtime.    . hydrochlorothiazide (HYDRODIURIL) 25 MG tablet TAKE 1/2 TABLET BY MOUTH EVERY MORNING 45 tablet 1  . lisinopril  (PRINIVIL,ZESTRIL) 20 MG tablet take 1 tablet by mouth once daily 90 tablet 1  . Multiple Vitamins-Minerals (ICAPS AREDS 2) CAPS Take 2 capsules by mouth daily.     . rosuvastatin (CRESTOR) 20 MG tablet TAKE 1 TABLET BY MOUTH EVERY DAY 90 tablet 1   No current facility-administered medications on file prior to visit.     PAST MEDICAL HISTORY: Past Medical History:  Diagnosis Date  . Adenomatous colon polyp   . Cancer ([place]) 03/2015   BCC R tibia   . Diverticulosis of colon   . GERD (gastroesophageal reflux disease)   . Hip pain   . Hyperlipidemia   . Hyperplastic colon polyp   . Hypertension   . Lactose intolerance   . Loose stools   . Lower back pain   . Osteoporosis   . Palpitations     PAST SURGICAL HISTORY: Past Surgical History:  Procedure Laterality Date  . EYE SURGERY  2012   B/L 01/2011  02/2011  . HAMMER TOE SURGERY     Bilateral  . ROTATOR CUFF REPAIR     Left  . TUBAL LIGATION      SOCIAL HISTORY: Social History   Tobacco Use  . Smoking status: Former Smoker    Packs/day: 0.50    Years: 30.00    Pack years: 15.00    Types: Cigarettes    Last attempt to quit: 06/02/1991    Years since quitting: 27.3  . Smokeless tobacco: Never Used  Substance Use Topics  . Alcohol use:  Yes    Alcohol/week: 5.0 standard drinks    Types: 5 Glasses of wine per week  . Drug use: No    FAMILY HISTORY: Family History  Problem Relation Age of Onset  . Hypertension Mother   . Cancer Mother 19       ?  Marland Kitchen Hyperlipidemia Mother   . Obesity Mother   . Hypertension Father   . Stroke Father   . Heart disease Father   . Hypertension Brother   . Cancer Brother 35       testicular   . Hypertension Brother   . Heart disease Brother        quadruple bypass    ROS: Review of Systems  Constitutional: Positive for malaise/fatigue and weight loss.  Cardiovascular: Negative for chest pain and claudication.  Gastrointestinal: Negative for nausea and vomiting.    Musculoskeletal: Negative for myalgias.       Negative muscle weakness  Endo/Heme/Allergies:       Negative hypoglycemia    PHYSICAL EXAM: Blood pressure 127/65, pulse 61, temperature 98 F (36.7 C), temperature source Oral, height 5' (1.524 m), weight 154 lb (69.9 kg), SpO2 96 %. Body mass index is 30.08 kg/m. Physical Exam  Constitutional: She is oriented to person, place, and time. She appears well-developed and well-nourished.  Cardiovascular: Normal rate.  Pulmonary/Chest: Effort normal.  Musculoskeletal: Normal range of motion.  Neurological: She is oriented to person, place, and time.  Skin: Skin is warm and dry.  Psychiatric: She has a normal mood and affect. Her behavior is normal.  Vitals reviewed.   RECENT LABS AND TESTS: BMET    Component Value Date/Time   NA 140 09/03/2018 1452   K 4.2 09/03/2018 1452   CL 101 09/03/2018 1452   CO2 30 09/03/2018 1452   GLUCOSE 83 09/03/2018 1452   BUN 17 09/03/2018 1452   CREATININE 0.63 09/03/2018 1452   CALCIUM 10.1 09/03/2018 1452   GFRNONAA 75.66 04/03/2010 1516   GFRAA 107 12/28/2008 0000   Lab Results  Component Value Date   HGBA1C 5.8 (H) 09/03/2018   No results found for: INSULIN CBC    Component Value Date/Time   WBC 7.9 09/03/2018 1452   RBC 4.97 09/03/2018 1452   HGB 14.5 09/03/2018 1452   HCT 43.8 09/03/2018 1452   PLT 240 09/03/2018 1452   MCV 88.1 09/03/2018 1452   MCH 29.2 09/03/2018 1452   MCHC 33.1 09/03/2018 1452   RDW 13.1 09/03/2018 1452   LYMPHSABS 2,623 09/03/2018 1452   MONOABS 1.0 06/17/2018 1722   EOSABS 498 09/03/2018 1452   BASOSABS 79 09/03/2018 1452   Iron/TIBC/Ferritin/ %Sat No results found for: IRON, TIBC, FERRITIN, IRONPCTSAT Lipid Panel     Component Value Date/Time   CHOL 179 09/03/2018 1452   TRIG 208 (H) 09/03/2018 1452   HDL 41 (L) 09/03/2018 1452   CHOLHDL 4.4 09/03/2018 1452   VLDL 32.6 06/17/2018 1722   LDLCALC 106 (H) 09/03/2018 1452   LDLDIRECT 129.1  08/23/2007 0913   Hepatic Function Panel     Component Value Date/Time   PROT 7.5 09/03/2018 1452   ALBUMIN 4.1 06/17/2018 1722   AST 24 09/03/2018 1452   ALT 22 09/03/2018 1452   ALKPHOS 73 06/17/2018 1722   BILITOT 0.5 09/03/2018 1452   BILIDIR 0.1 02/19/2015 0847      Component Value Date/Time   TSH 1.75 09/03/2018 1452   TSH 1.95 06/17/2018 1722   TSH 1.71 07/02/2017 1109  Results  for SAMAIYAH, HOWES (MRN [id number]) as of 09/27/2018 12:01  Ref. Range 09/03/2018 14:52  Vitamin D, 25-Hydroxy Latest Ref Range: 30 - 100 ng/mL 23 (L)    ASSESSMENT AND PLAN: Other hyperlipidemia  Vitamin D deficiency - Plan: Vitamin D, Ergocalciferol, (DRISDOL) 50000 units CAPS capsule  Prediabetes  Class 1 obesity with serious comorbidity and body mass index (BMI) of 30.0 to 30.9 in adult, unspecified obesity type  PLAN:  Hyperlipidemia Chantavia was informed of the American Heart Association Guidelines emphasizing intensive lifestyle modifications as the first line treatment for hyperlipidemia. We discussed many lifestyle modifications today in depth, and Piccola will continue to work on decreasing saturated fats such as fatty red meat, butter and many fried foods. Janneth agrees to continue taking Crestor, and will also increase vegetables and lean protein in her diet and continue to work on diet, exercise, and weight loss efforts. We will recheck labs in 3 months. Jaelee agrees to follow up with our clinic in 2 weeks.  Vitamin D Deficiency Cindy Velasquez was informed that low vitamin D levels contributes to fatigue and are associated with obesity, breast, and colon cancer. Cindy Velasquez agrees to continue taking prescription Vit D @50 ,000 IU every week #4 and we will refill for 1 month. She will follow up for routine testing of vitamin D, at least 2-3 times per year. She was informed of the risk of over-replacement of vitamin D and agrees to not increase her dose unless she discusses this with [place] first. We will  recheck labs in 3 months. Cindy Velasquez agrees to follow up with our clinic in 2 weeks.  Pre-Diabetes Cindy Velasquez will continue to work on weight loss,diet, exercise, and decreasing simple carbohydrates in her diet to help decrease the risk of diabetes. We dicussed metformin including benefits and risks. She was informed that eating too many simple carbohydrates or too many calories at one sitting increases the likelihood of GI side effects. Cindy Velasquez declined metformin for now and a prescription was not written today. We will recheck labs in 3 months. Cindy Velasquez agrees to follow up with our clinic in 2 weeks as directed to monitor her progress.  Obesity Cindy Velasquez is currently in the action stage of change. As such, her goal is to continue with weight loss efforts She has agreed to follow the Category 2 plan Cindy Velasquez has been instructed to work up to a goal of 150 minutes of combined cardio and strengthening exercise per week for weight loss and overall health benefits. We discussed the following Behavioral Modification Strategies today: increasing lean protein intake, decreasing simple carbohydrates, decrease eating out and work on meal planning and easy cooking plans   Cindy Velasquez has agreed to follow up with our clinic in 2 weeks. She was informed of the importance of frequent follow up visits to maximize her success with intensive lifestyle modifications for her multiple health conditions.   OBESITY BEHAVIORAL INTERVENTION VISIT  Today's visit was # 2   Starting weight: 158 lbs Starting date: 09/06/18 Today's weight : 154 lbs Today's date: 09/22/2018 Total lbs lost to date: 4 At least 15 minutes were spent on discussing the following behavioral intervention visit.   ASK: We discussed the diagnosis of obesity with Cindy Velasquez today and Cindy Velasquez agreed to give [place] permission to discuss obesity behavioral modification therapy today.  ASSESS: Cindy Velasquez has the diagnosis of obesity and her BMI today is 30.08 Cindy Velasquez is  in the action stage of change   ADVISE: Cindy Velasquez was educated on the multiple health risks  of obesity as well as the benefit of weight loss to improve her health. She was advised of the need for long term treatment and the importance of lifestyle modifications to improve her current health and to decrease her risk of future health problems.  AGREE: Multiple dietary modification options and treatment options were discussed and  Cindy Velasquez agreed to follow the recommendations documented in the above note.  ARRANGE: Cindy Velasquez was educated on the importance of frequent visits to treat obesity as outlined per CMS and USPSTF guidelines and agreed to schedule her next follow up appointment today.  I, Trixie Dredge, am acting as transcriptionist for Dennard Nip, MD  I have reviewed the above documentation for accuracy and completeness, and I agree with the above. -Dennard Nip, MD

## 2018-10-02 ENCOUNTER — Other Ambulatory Visit (INDEPENDENT_AMBULATORY_CARE_PROVIDER_SITE_OTHER): Payer: Self-pay | Admitting: Family Medicine

## 2018-10-02 DIAGNOSIS — E559 Vitamin D deficiency, unspecified: Secondary | ICD-10-CM

## 2018-10-11 ENCOUNTER — Ambulatory Visit (INDEPENDENT_AMBULATORY_CARE_PROVIDER_SITE_OTHER): Payer: Medicare HMO | Admitting: Family Medicine

## 2018-10-11 VITALS — BP 119/69 | HR 70 | Temp 97.8°F | Ht 60.0 in | Wt 152.0 lb

## 2018-10-11 DIAGNOSIS — E669 Obesity, unspecified: Secondary | ICD-10-CM | POA: Diagnosis not present

## 2018-10-11 DIAGNOSIS — Z6830 Body mass index (BMI) 30.0-30.9, adult: Secondary | ICD-10-CM | POA: Diagnosis not present

## 2018-10-11 DIAGNOSIS — I10 Essential (primary) hypertension: Secondary | ICD-10-CM | POA: Diagnosis not present

## 2018-10-11 DIAGNOSIS — E66811 Obesity, class 1: Secondary | ICD-10-CM

## 2018-10-12 NOTE — Progress Notes (Signed)
Office: [phone]  /  Fax: [phone]   HPI:   Chief Complaint: OBESITY Cindy Velasquez is here to discuss her progress with her obesity treatment plan. She is on the Category 2 plan and is following her eating plan approximately 75 % of the time. She states she is at the [facility] doing stretches, strengthening, and yoga for 60 minutes 3 times per week. Cindy Velasquez continues to do well with weight loss on Category 2 plan, even with week long trip with girlfriends. She was mindful about eating lean protein.  Her weight is 152 lb (68.9 kg) today and has had a weight loss of 2 pounds over a period of 2 to 3 weeks since her last visit. She has lost 6 lbs since starting treatment with [place].  Hypertension Cindy Velasquez is a 77 y.o. female with hypertension. Cindy Velasquez is on lisinopril and hydrochlorothiazide. She denies chest pain, lightheadedness, or dizziness. She is working weight loss to help control her blood pressure with the goal of decreasing her risk of heart attack and stroke. Cindy Velasquez blood pressure is currently controlled.  ALLERGIES: No Known Allergies  MEDICATIONS: Current Outpatient Medications on File Prior to Visit  Medication Sig Dispense Refill  . diphenhydramine-acetaminophen (TYLENOL PM) 25-500 MG TABS tablet Take 1 tablet by mouth at bedtime.    . hydrochlorothiazide (HYDRODIURIL) 25 MG tablet TAKE 1/2 TABLET BY MOUTH EVERY MORNING 45 tablet 1  . lisinopril (PRINIVIL,ZESTRIL) 20 MG tablet take 1 tablet by mouth once daily 90 tablet 1  . Multiple Vitamins-Minerals (ICAPS AREDS 2) CAPS Take 2 capsules by mouth daily.     . rosuvastatin (CRESTOR) 20 MG tablet TAKE 1 TABLET BY MOUTH EVERY DAY 90 tablet 1  . Vitamin D, Ergocalciferol, (DRISDOL) 50000 units CAPS capsule Take 1 capsule (50,000 Units total) by mouth every 7 (seven) days. 4 capsule 0   No current facility-administered medications on file prior to visit.     PAST MEDICAL HISTORY: Past Medical History:  Diagnosis Date  .  Adenomatous colon polyp   . Cancer ([place]) 03/2015   BCC R tibia   . Diverticulosis of colon   . GERD (gastroesophageal reflux disease)   . Hip pain   . Hyperlipidemia   . Hyperplastic colon polyp   . Hypertension   . Lactose intolerance   . Loose stools   . Lower back pain   . Osteoporosis   . Palpitations     PAST SURGICAL HISTORY: Past Surgical History:  Procedure Laterality Date  . EYE SURGERY  2012   B/L 01/2011  02/2011  . HAMMER TOE SURGERY     Bilateral  . ROTATOR CUFF REPAIR     Left  . TUBAL LIGATION      SOCIAL HISTORY: Social History   Tobacco Use  . Smoking status: Former Smoker    Packs/day: 0.50    Years: 30.00    Pack years: 15.00    Types: Cigarettes    Last attempt to quit: 06/02/1991    Years since quitting: 27.3  . Smokeless tobacco: Never Used  Substance Use Topics  . Alcohol use: Yes    Alcohol/week: 5.0 standard drinks    Types: 5 Glasses of wine per week  . Drug use: No    FAMILY HISTORY: Family History  Problem Relation Age of Onset  . Hypertension Mother   . Cancer Mother 51       ?  Cindy Velasquez Kitchen Hyperlipidemia Mother   . Obesity Mother   . Hypertension Father   .  Stroke Father   . Heart disease Father   . Hypertension Brother   . Cancer Brother 35       testicular   . Hypertension Brother   . Heart disease Brother        quadruple bypass    ROS: Review of Systems  Constitutional: Positive for weight loss.  Cardiovascular: Negative for chest pain.  Neurological: Negative for dizziness.       Negative lightheadedness    PHYSICAL EXAM: Blood pressure 119/69, pulse 70, temperature 97.8 F (36.6 C), temperature source Oral, height 5' (1.524 m), weight 152 lb (68.9 kg), SpO2 98 %. Body mass index is 29.69 kg/m. Physical Exam  Constitutional: She is oriented to person, place, and time. She appears well-developed and well-nourished.  Cardiovascular: Normal rate.  Pulmonary/Chest: Effort normal.  Musculoskeletal: Normal range of  motion.  Neurological: She is oriented to person, place, and time.  Skin: Skin is warm and dry.  Psychiatric: She has a normal mood and affect. Her behavior is normal.  Vitals reviewed.   RECENT LABS AND TESTS: BMET    Component Value Date/Time   NA 140 09/03/2018 1452   K 4.2 09/03/2018 1452   CL 101 09/03/2018 1452   CO2 30 09/03/2018 1452   GLUCOSE 83 09/03/2018 1452   BUN 17 09/03/2018 1452   CREATININE 0.63 09/03/2018 1452   CALCIUM 10.1 09/03/2018 1452   GFRNONAA 75.66 04/03/2010 1516   GFRAA 107 12/28/2008 0000   Lab Results  Component Value Date   HGBA1C 5.8 (H) 09/03/2018   No results found for: INSULIN CBC    Component Value Date/Time   WBC 7.9 09/03/2018 1452   RBC 4.97 09/03/2018 1452   HGB 14.5 09/03/2018 1452   HCT 43.8 09/03/2018 1452   PLT 240 09/03/2018 1452   MCV 88.1 09/03/2018 1452   MCH 29.2 09/03/2018 1452   MCHC 33.1 09/03/2018 1452   RDW 13.1 09/03/2018 1452   LYMPHSABS 2,623 09/03/2018 1452   MONOABS 1.0 06/17/2018 1722   EOSABS 498 09/03/2018 1452   BASOSABS 79 09/03/2018 1452   Iron/TIBC/Ferritin/ %Sat No results found for: IRON, TIBC, FERRITIN, IRONPCTSAT Lipid Panel     Component Value Date/Time   CHOL 179 09/03/2018 1452   TRIG 208 (H) 09/03/2018 1452   HDL 41 (L) 09/03/2018 1452   CHOLHDL 4.4 09/03/2018 1452   VLDL 32.6 06/17/2018 1722   LDLCALC 106 (H) 09/03/2018 1452   LDLDIRECT 129.1 08/23/2007 0913   Hepatic Function Panel     Component Value Date/Time   PROT 7.5 09/03/2018 1452   ALBUMIN 4.1 06/17/2018 1722   AST 24 09/03/2018 1452   ALT 22 09/03/2018 1452   ALKPHOS 73 06/17/2018 1722   BILITOT 0.5 09/03/2018 1452   BILIDIR 0.1 02/19/2015 0847      Component Value Date/Time   TSH 1.75 09/03/2018 1452   TSH 1.95 06/17/2018 1722   TSH 1.71 07/02/2017 1109    ASSESSMENT AND PLAN: Essential hypertension  Class 1 obesity with serious comorbidity and body mass index (BMI) of 30.0 to 30.9 in adult, unspecified  obesity type - Starting BMI greater then 30  PLAN:  Hypertension We discussed sodium restriction, working on healthy weight loss, and a regular exercise program as the means to achieve improved blood pressure control. Cindy Velasquez agreed with this plan and agreed to follow up as directed. We will continue to monitor her blood pressure as well as her progress with the above lifestyle modifications. Yeily agrees to  continue her diet and medications, the goal is to be able to control with less medications eventually. She will watch for signs of hypotension as she continues her lifestyle modifications. Cindy Velasquez agrees to follow up with our clinic in 2 weeks.  I spent > than 50% of the 15 minute visit on counseling as documented in the note.  Obesity Cindy Velasquez is currently in the action stage of change. As such, her goal is to continue with weight loss efforts She has agreed to follow the Category 2 plan Cindy Velasquez has been instructed to work up to a goal of 150 minutes of combined cardio and strengthening exercise per week for weight loss and overall health benefits. We discussed the following Behavioral Modification Strategies today: increasing lean protein intake, decreasing simple carbohydrates, work on meal planning and easy cooking plans, emotional eating strategies, and no skipping meals   Cindy Velasquez has agreed to follow up with our clinic in 2 weeks. She was informed of the importance of frequent follow up visits to maximize her success with intensive lifestyle modifications for her multiple health conditions.   OBESITY BEHAVIORAL INTERVENTION VISIT  Today's visit was # 3   Starting weight: 158 lbs Starting date: 09/06/18 Today's weight : 152 lbs  Today's date: 10/11/2018 Total lbs lost to date: 6    ASK: We discussed the diagnosis of obesity with Cindy Velasquez today and Cindy Velasquez agreed to give [place] permission to discuss obesity behavioral modification therapy today.  ASSESS: Cindy Velasquez has the diagnosis  of obesity and her BMI today is 29.69 Cindy Velasquez is in the action stage of change   ADVISE: Cindy Velasquez was educated on the multiple health risks of obesity as well as the benefit of weight loss to improve her health. She was advised of the need for long term treatment and the importance of lifestyle modifications to improve her current health and to decrease her risk of future health problems.  AGREE: Multiple dietary modification options and treatment options were discussed and  Cindy Velasquez agreed to follow the recommendations documented in the above note.  ARRANGE: Cindy Velasquez was educated on the importance of frequent visits to treat obesity as outlined per CMS and USPSTF guidelines and agreed to schedule her next follow up appointment today.  I, Trixie Dredge, am acting as transcriptionist for Dennard Nip, MD  I have reviewed the above documentation for accuracy and completeness, and I agree with the above. -Dennard Nip, MD

## 2018-10-13 ENCOUNTER — Encounter: Payer: Self-pay | Admitting: Internal Medicine

## 2018-10-13 ENCOUNTER — Ambulatory Visit: Payer: Medicare HMO | Admitting: Internal Medicine

## 2018-10-13 VITALS — BP 100/62 | HR 78 | Ht 60.0 in | Wt 156.0 lb

## 2018-10-13 DIAGNOSIS — Z86010 Personal history of colonic polyps: Secondary | ICD-10-CM | POA: Diagnosis not present

## 2018-10-13 DIAGNOSIS — R197 Diarrhea, unspecified: Secondary | ICD-10-CM

## 2018-10-13 MED ORDER — NA SULFATE-K SULFATE-MG SULF 17.5-3.13-1.6 GM/177ML PO SOLN: 1.0000 | Freq: Once | ORAL | 0 refills | Status: AC

## 2018-10-13 NOTE — Progress Notes (Signed)
HISTORY OF PRESENT ILLNESS:  Cindy Velasquez is a 77 y.o. female native of [place] who was sent today by Dr. Cheri Rous regarding worsening problems with diarrhea.  The patient has had varying degrees of diarrhea with urgency over the years.  I last saw her in May 2011 when she underwent upper endoscopy and colonoscopy.  Upper endoscopy was being performed for Hemoccult positive stool and revealed mild nonerosive duodenitis.  Colonoscopy was being performed for Hemoccult positive stool, change in bowel habits with a tendency toward diarrhea, and surveillance regarding history of adenomatous colon polyps in September 2008.  The examination revealed moderate sigmoid diverticulosis but was otherwise normal.  The ileum was normal.  Random colon biopsies were normal.  No microscopic colitis.  Follow-up in 5 years recommended.  Patient has not been seen since.  She tells me that her problems with loose bowels have worsened over the past year.  Typically several bowel movements per day.  Currently 5 or 6 bowel movements per day.  She does not have constipation and does not have a day without a bowel movement.  There is occasional urgency which concerns her in social situations.  She states this is affecting her lifestyle in a negative manner.  She denies abdominal cramping, incontinence, or nocturnal component.  Her weight has been stable.  She has been on a diet for several weeks and noticed that her symptoms are somewhat less.  Her gallbladder is intact.  She feels that the active eating and stress may exacerbate or bring on symptoms.  GI review of systems is otherwise remarkable for belching.  Review of outside laboratory work from September 2019 finds unremarkable comprehensive metabolic panel.  Normal liver test.  Unremarkable CBC with hemoglobin 14.5.  Review of x-rays and the x-ray file shows no relevant abnormalities  REVIEW OF SYSTEMS:  All non-GI ROS negative less otherwise stated in the HPI except for  back pain, fatigue, excessive urination, urinary leakage, ankle swelling  Past Medical History:  Diagnosis Date  . Adenomatous colon polyp   . Cancer ([place]) 03/2015   BCC R tibia   . Diverticulosis of colon   . GERD (gastroesophageal reflux disease)   . Hip pain   . Hyperlipidemia   . Hyperplastic colon polyp   . Hypertension   . Lactose intolerance   . Loose stools   . Lower back pain   . Osteoporosis   . Palpitations     Past Surgical History:  Procedure Laterality Date  . EYE SURGERY  2012   B/L 01/2011  02/2011  . HAMMER TOE SURGERY     Bilateral  . ROTATOR CUFF REPAIR     Left  . TUBAL LIGATION      Social History Cindy Velasquez  reports that she quit smoking about 27 years ago. Her smoking use included cigarettes. She has a 15.00 pack-year smoking history. She has never used smokeless tobacco. She reports that she drinks about 5.0 standard drinks of alcohol per week. She reports that she does not use drugs.  family history includes Cancer (age of onset: 43) in her mother; Heart disease in her brother and father; Hyperlipidemia in her mother; Hypertension in her brother, brother, father, and mother; Obesity in her mother; Stroke in her father; Testicular cancer (age of onset: 26) in her brother.  No Known Allergies     PHYSICAL EXAMINATION: Vital signs: BP 100/62   Pulse 78   Ht 5' (1.524 m)   Wt 156 lb (  70.8 kg)   BMI 30.47 kg/m   Constitutional: generally well-appearing, no acute distress Psychiatric: alert and oriented x3, cooperative Eyes: extraocular movements intact, anicteric, conjunctiva pink Mouth: oral pharynx moist, no lesions Neck: supple no lymphadenopathy Cardiovascular: heart regular rate and rhythm, no murmur Lungs: clear to auscultation bilaterally Abdomen: soft, nontender, nondistended, no obvious ascites, no peritoneal signs, normal bowel sounds, no organomegaly Rectal: Deferred until colonoscopy Extremities: no clubbing, cyanosis, or lower  extremity edema bilaterally Skin: no lesions on visible extremities Neuro: No focal deficits.  Cranial nerves intact  ASSESSMENT:  1.  Worsening problems with diarrhea and urgency.  Possible etiologies include microscopic colitis, irritable bowel syndrome, celiac disease, bile salt related diarrhea, bacterial overgrowth. 2.  History of adenomatous colon polyps 2008.  Last colonoscopy 2011- for neoplasia.  Overdue for follow-up 3.  Diverticulosis 4.  General medical problems.  Stable   PLAN:  1.  Schedule colonoscopy with biopsies.  This to provide adenomatous colon polyp surveillance and evaluate worsening diarrhea.  Specifically, rule out microscopic colitis.The nature of the procedure, as well as the risks, benefits, and alternatives were carefully and thoroughly reviewed with the patient. Ample time for discussion and questions allowed. The patient understood, was satisfied, and agreed to proceed. 2.  After the results of the above do not consider empiric therapies or therapy such as Colestid, Xifaxan, fiber.  May need testing for celiac disease

## 2018-10-13 NOTE — Patient Instructions (Signed)

## 2018-10-26 ENCOUNTER — Encounter (INDEPENDENT_AMBULATORY_CARE_PROVIDER_SITE_OTHER): Payer: Self-pay

## 2018-10-26 ENCOUNTER — Ambulatory Visit (INDEPENDENT_AMBULATORY_CARE_PROVIDER_SITE_OTHER): Payer: Medicare HMO | Admitting: Family Medicine

## 2018-10-30 ENCOUNTER — Other Ambulatory Visit (INDEPENDENT_AMBULATORY_CARE_PROVIDER_SITE_OTHER): Payer: Self-pay | Admitting: Family Medicine

## 2018-10-30 DIAGNOSIS — E559 Vitamin D deficiency, unspecified: Secondary | ICD-10-CM

## 2018-11-02 ENCOUNTER — Ambulatory Visit (INDEPENDENT_AMBULATORY_CARE_PROVIDER_SITE_OTHER): Payer: Medicare HMO | Admitting: Family Medicine

## 2018-11-02 ENCOUNTER — Encounter (INDEPENDENT_AMBULATORY_CARE_PROVIDER_SITE_OTHER): Payer: Self-pay | Admitting: Family Medicine

## 2018-11-02 VITALS — BP 108/68 | HR 74 | Temp 98.1°F | Ht 60.0 in | Wt 150.0 lb

## 2018-11-02 DIAGNOSIS — R7303 Prediabetes: Secondary | ICD-10-CM | POA: Diagnosis not present

## 2018-11-02 DIAGNOSIS — Z6830 Body mass index (BMI) 30.0-30.9, adult: Secondary | ICD-10-CM

## 2018-11-02 DIAGNOSIS — E559 Vitamin D deficiency, unspecified: Secondary | ICD-10-CM | POA: Diagnosis not present

## 2018-11-02 DIAGNOSIS — E669 Obesity, unspecified: Secondary | ICD-10-CM

## 2018-11-02 DIAGNOSIS — E66811 Obesity, class 1: Secondary | ICD-10-CM

## 2018-11-02 MED ORDER — VITAMIN D (ERGOCALCIFEROL) 1.25 MG (50000 UNIT) PO CAPS: 50000.0000 [IU] | ORAL_CAPSULE | ORAL | 0 refills | Status: DC

## 2018-11-08 ENCOUNTER — Encounter (INDEPENDENT_AMBULATORY_CARE_PROVIDER_SITE_OTHER): Payer: Self-pay | Admitting: Family Medicine

## 2018-11-08 NOTE — Progress Notes (Signed)
Office: [phone]  /  Fax: [phone]   HPI:   Chief Complaint: OBESITY Cindy Velasquez is here to discuss her progress with her obesity treatment plan. She is on the  follow the Category 2 plan and is following her eating plan approximately 80 % of the time. She states she is exercising 0 minutes 0 times per week. [name] admits to hunger after dinner. She only eats 6 oz of protein at dinner because she does not like an 8 oz portion.  Her weight is 150 lb (68 kg) today and has had a weight loss of 2 pounds over a period of 3 weeks since her last visit. She has lost 8 lbs since starting treatment with [place].  Pre-Diabetes Cindy Velasquez has a diagnosis of prediabetes based on her elevated HgA1c and was informed this puts her at greater risk of developing diabetes. She is not taking metformin currently and continues to work on diet and exercise to decrease risk of diabetes. She denies nausea or hypoglycemia. She admits to polyphagia early evenings after dinner.   Vitamin D deficiency Cindy Velasquez has a diagnosis of vitamin D deficiency, not yet at goal.  She is currently taking vit D and denies nausea, vomiting or muscle weakness.  Ref. Range 09/03/2018 14:52  Vitamin D, 25-Hydroxy Latest Ref Range: 30 - 100 ng/mL 23 (L)    ALLERGIES: No Known Allergies  MEDICATIONS: Current Outpatient Medications on File Prior to Visit  Medication Sig Dispense Refill  . diphenhydramine-acetaminophen (TYLENOL PM) 25-500 MG TABS tablet Take 1 tablet by mouth at bedtime.    . hydrochlorothiazide (HYDRODIURIL) 25 MG tablet TAKE 1/2 TABLET BY MOUTH EVERY MORNING 45 tablet 1  . lisinopril (PRINIVIL,ZESTRIL) 20 MG tablet take 1 tablet by mouth once daily 90 tablet 1  . Multiple Vitamins-Minerals (ICAPS AREDS 2) CAPS Take 2 capsules by mouth daily.     . rosuvastatin (CRESTOR) 20 MG tablet TAKE 1 TABLET BY MOUTH EVERY DAY 90 tablet 1   No current facility-administered medications on file prior to visit.     PAST MEDICAL  HISTORY: Past Medical History:  Diagnosis Date  . Adenomatous colon polyp   . Cancer ([place]) 03/2015   BCC R tibia   . Diverticulosis of colon   . GERD (gastroesophageal reflux disease)   . Hip pain   . Hyperlipidemia   . Hyperplastic colon polyp   . Hypertension   . Lactose intolerance   . Loose stools   . Lower back pain   . Osteoporosis   . Palpitations     PAST SURGICAL HISTORY: Past Surgical History:  Procedure Laterality Date  . EYE SURGERY  2012   B/L 01/2011  02/2011  . HAMMER TOE SURGERY     Bilateral  . ROTATOR CUFF REPAIR     Left  . TUBAL LIGATION      SOCIAL HISTORY: Social History   Tobacco Use  . Smoking status: Former Smoker    Packs/day: 0.50    Years: 30.00    Pack years: 15.00    Types: Cigarettes    Last attempt to quit: 06/02/1991    Years since quitting: 27.4  . Smokeless tobacco: Never Used  Substance Use Topics  . Alcohol use: Yes    Alcohol/week: 5.0 standard drinks    Types: 5 Glasses of wine per week  . Drug use: No    FAMILY HISTORY: Family History  Problem Relation Age of Onset  . Hypertension Mother   . Cancer Mother 14       ?  Marland Kitchen  Hyperlipidemia Mother   . Obesity Mother   . Hypertension Father   . Stroke Father   . Heart disease Father   . Hypertension Brother   . Testicular cancer Brother 10  . Hypertension Brother   . Heart disease Brother        quadruple bypass  . Colon cancer Neg Hx   . Stomach cancer Neg Hx   . Throat cancer Neg Hx     ROS: Review of Systems  Constitutional: Positive for weight loss.  Gastrointestinal: Negative for nausea and vomiting.  Musculoskeletal:       Negative for muscle weakness  Endo/Heme/Allergies:       Negative for hypoglycemia    PHYSICAL EXAM: Blood pressure 108/68, pulse 74, temperature 98.1 F (36.7 C), temperature source Oral, height 5' (1.524 m), weight 150 lb (68 kg), SpO2 97 %. Body mass index is 29.29 kg/m. Physical Exam  Constitutional: She is oriented to  person, place, and time. She appears well-developed and well-nourished.  HENT:  Head: Normocephalic.  Eyes: Pupils are equal, round, and reactive to light.  Neck: Normal range of motion.  Cardiovascular: Normal rate.  Pulmonary/Chest: Effort normal.  Musculoskeletal: Normal range of motion.  Neurological: She is alert and oriented to person, place, and time.  Skin: Skin is warm and dry.  Psychiatric: She has a normal mood and affect. Her behavior is normal.  Vitals reviewed.   RECENT LABS AND TESTS: BMET    Component Value Date/Time   NA 140 09/03/2018 1452   K 4.2 09/03/2018 1452   CL 101 09/03/2018 1452   CO2 30 09/03/2018 1452   GLUCOSE 83 09/03/2018 1452   BUN 17 09/03/2018 1452   CREATININE 0.63 09/03/2018 1452   CALCIUM 10.1 09/03/2018 1452   GFRNONAA 75.66 04/03/2010 1516   GFRAA 107 12/28/2008 0000   Lab Results  Component Value Date   HGBA1C 5.8 (H) 09/03/2018   No results found for: INSULIN CBC    Component Value Date/Time   WBC 7.9 09/03/2018 1452   RBC 4.97 09/03/2018 1452   HGB 14.5 09/03/2018 1452   HCT 43.8 09/03/2018 1452   PLT 240 09/03/2018 1452   MCV 88.1 09/03/2018 1452   MCH 29.2 09/03/2018 1452   MCHC 33.1 09/03/2018 1452   RDW 13.1 09/03/2018 1452   LYMPHSABS 2,623 09/03/2018 1452   MONOABS 1.0 06/17/2018 1722   EOSABS 498 09/03/2018 1452   BASOSABS 79 09/03/2018 1452   Iron/TIBC/Ferritin/ %Sat No results found for: IRON, TIBC, FERRITIN, IRONPCTSAT Lipid Panel     Component Value Date/Time   CHOL 179 09/03/2018 1452   TRIG 208 (H) 09/03/2018 1452   HDL 41 (L) 09/03/2018 1452   CHOLHDL 4.4 09/03/2018 1452   VLDL 32.6 06/17/2018 1722   LDLCALC 106 (H) 09/03/2018 1452   LDLDIRECT 129.1 08/23/2007 0913   Hepatic Function Panel     Component Value Date/Time   PROT 7.5 09/03/2018 1452   ALBUMIN 4.1 06/17/2018 1722   AST 24 09/03/2018 1452   ALT 22 09/03/2018 1452   ALKPHOS 73 06/17/2018 1722   BILITOT 0.5 09/03/2018 1452    BILIDIR 0.1 02/19/2015 0847      Component Value Date/Time   TSH 1.75 09/03/2018 1452   TSH 1.95 06/17/2018 1722   TSH 1.71 07/02/2017 1109    Ref. Range 09/03/2018 14:52  Vitamin D, 25-Hydroxy Latest Ref Range: 30 - 100 ng/mL 23 (L)    ASSESSMENT AND PLAN: Prediabetes  Vitamin D deficiency -  Plan: Vitamin D, Ergocalciferol, (DRISDOL) 1.25 MG (50000 UT) CAPS capsule  Class 1 obesity with serious comorbidity and body mass index (BMI) of 30.0 to 30.9 in adult, unspecified obesity type - BMI greater than 30 at start of program   PLAN: Pre-Diabetes Artice will continue to work on weight loss, exercise, and decreasing simple carbohydrates in her diet to help decrease the risk of diabetes. We dicussed metformin including benefits and risks. She was informed that eating too many simple carbohydrates or too many calories at one sitting increases the likelihood of GI side effects. Elham agreed to follow up with [place] as directed to monitor her progress. We will repeat A1c in 1 month.   Vitamin D Deficiency Ernest was informed that low vitamin D levels contributes to fatigue and are associated with obesity, breast, and colon cancer. She agrees to continue to take prescription Vit D @50 ,000 IU every week #4 with no refills and will follow up for routine testing of vitamin D, at least 2-3 times per year. She was informed of the risk of over-replacement of vitamin D and agrees to not increase her dose unless she discusses this with [place] first. Agrees to follow up with our clinic as directed. We will repeat level in 1 month.   Obesity Cindy Velasquez is currently in the action stage of change. As such, her goal is to continue with weight loss efforts She has agreed to follow the Category 2 plan  We discussed planning protein snack after dinner.  We discussed the following Behavioral Modification Strategies today: increasing lean protein intake, better snacking choices, holiday eating strategies, planning for  success, and holiday eating strategies.   Cindy Velasquez has not been prescribed exercise at this time.  Cindy Velasquez has agreed to follow up with our clinic in 2 weeks. She was informed of the importance of frequent follow up visits to maximize her success with intensive lifestyle modifications for her multiple health conditions.   OBESITY BEHAVIORAL INTERVENTION VISIT  Today's visit was # 4   Starting weight: 158 lb Starting date: 09/06/18 Today's weight : Weight: 150 lb (68 kg)  Today's date: 11/02/18 Total lbs lost to date: 8 lb At least 15 minutes were spent on discussing the following behavioral intervention visit.   ASK: We discussed the diagnosis of obesity with Ilda Mori today and Mahathi agreed to give [place] permission to discuss obesity behavioral modification therapy today.  ASSESS: Cindy Velasquez has the diagnosis of obesity and her BMI today is 29.3 A[place]on is in the action stage of change   ADVISE: Lexus was educated on the multiple health risks of obesity as well as the benefit of weight loss to improve her health. She was advised of the need for long term treatment and the importance of lifestyle modifications to improve her current health and to decrease her risk of future health problems.  AGREE: Multiple dietary modification options and treatment options were discussed and  Charnele agreed to follow the recommendations documented in the above note.  ARRANGE: Katerine was educated on the importance of frequent visits to treat obesity as outlined per CMS and USPSTF guidelines and agreed to schedule her next follow up appointment today.  I, Renee Ramus, am acting as Location manager for [employer], [place].  I have reviewed the above documentation for accuracy and completeness, and I agree with the above.  -  , FNP-C.

## 2018-11-10 ENCOUNTER — Encounter: Payer: Self-pay | Admitting: Medical

## 2018-11-10 ENCOUNTER — Ambulatory Visit (INDEPENDENT_AMBULATORY_CARE_PROVIDER_SITE_OTHER): Payer: Medicare HMO | Admitting: Medical

## 2018-11-10 VITALS — BP 125/70 | HR 82 | Temp 97.9°F

## 2018-11-10 DIAGNOSIS — R0981 Nasal congestion: Secondary | ICD-10-CM

## 2018-11-10 DIAGNOSIS — M791 Myalgia, unspecified site: Secondary | ICD-10-CM

## 2018-11-10 DIAGNOSIS — R05 Cough: Secondary | ICD-10-CM | POA: Diagnosis not present

## 2018-11-10 DIAGNOSIS — R059 Cough, unspecified: Secondary | ICD-10-CM

## 2018-11-10 MED ORDER — AZITHROMYCIN 250 MG PO TABS: ORAL_TABLET | ORAL | 0 refills | Status: DC

## 2018-11-10 MED ORDER — LEVOCETIRIZINE DIHYDROCHLORIDE 5 MG PO TABS: 5.0000 mg | ORAL_TABLET | Freq: Every evening | ORAL | 0 refills | Status: DC

## 2018-11-10 MED ORDER — FLUTICASONE PROPIONATE 50 MCG/ACT NA SUSP: 2.0000 | Freq: Every day | NASAL | 1 refills | Status: DC

## 2018-11-10 NOTE — Patient Instructions (Signed)
Your rapid flu test was negative.  Your clinical presentation is not very suspicious for flu in light of the fact that myalgias have already resolved.   Overall you describe suffering from recent cold/URI followed by probable allergic rhinitis most recently.  On exam no obvious indication of sinus infection, ear infection or bronchitis.  I will prescribe you Flonase nasal spray and Xyzal antihistamine.  I decide to go ahead and make a azithromycin antibiotic available as a printed prescription.  You can take this if your symptoms worsen despite the above treatment.  Particularly if you develop sinus pressure, sore throat or productive cough.  You are very busy this upcoming week and I do not want you to miss those activities due to bacterial infection.  Follow-up in 7 to 10 days or as needed.

## 2018-11-10 NOTE — Progress Notes (Signed)
Subjective:    Patient ID: Cindy Velasquez, female    DOB: 1940-12-13, 77 y.o.   MRN: [id number]  HPI  Pt in for 4 weeks of feeling sick. She state at first thought just cold. She took corcidin. Felt mild better but around thanksgiving got runny nose. Then monday had increased pnd and some mild  body aches and chills. No aches yesterday. Pt not coughing up mucus. States rare cough.    Pt states did get flu vaccine this year in October.  She has some nasal congestion. Pt has hx of watery eyes when she gets allergies. States can feel pnd. Coughing intermittent/rare as stated above.  No fever, no chills or sweats currently.     Review of Systems  Constitutional: Negative for chills, fatigue and fever.  HENT: Positive for congestion and postnasal drip. Negative for sinus pressure, sinus pain, sore throat and trouble swallowing.   Eyes: Negative for pain, discharge and visual disturbance.       Some watery eyes.  Respiratory: Positive for cough. Negative for chest tightness, shortness of breath and wheezing.        Mild intermittent cough.  Cardiovascular: Negative for chest pain and palpitations.  Gastrointestinal: Negative for abdominal pain and diarrhea.  Musculoskeletal: Positive for myalgias.       See hpi.  Skin: Negative for rash.  Hematological: Negative for adenopathy. Does not bruise/bleed easily.  Psychiatric/Behavioral: Negative for decreased concentration. The patient is not nervous/anxious.    Past Medical History:  Diagnosis Date  . Adenomatous colon polyp   . Cancer ([place]) 03/2015   BCC R tibia   . Diverticulosis of colon   . GERD (gastroesophageal reflux disease)   . Hip pain   . Hyperlipidemia   . Hyperplastic colon polyp   . Hypertension   . Lactose intolerance   . Loose stools   . Lower back pain   . Osteoporosis   . Palpitations      Social History   Socioeconomic History  . Marital status: Married    Spouse name: Moria Brophy  . Number of children:  4  . Years of education: Not on file  . Highest education level: Not on file  Occupational History  . Occupation: retired--[employer]  Social Needs  . Financial resource strain: Not on file  . Food insecurity:    Worry: Not on file    Inability: Not on file  . Transportation needs:    Medical: Not on file    Non-medical: Not on file  Tobacco Use  . Smoking status: Former Smoker    Packs/day: 0.50    Years: 30.00    Pack years: 15.00    Types: Cigarettes    Last attempt to quit: 06/02/1991    Years since quitting: 27.4  . Smokeless tobacco: Never Used  Substance and Sexual Activity  . Alcohol use: Yes    Alcohol/week: 5.0 standard drinks    Types: 5 Glasses of wine per week  . Drug use: No  . Sexual activity: Not Currently    Partners: Male  Lifestyle  . Physical activity:    Days per week: Not on file    Minutes per session: Not on file  . Stress: Not on file  Relationships  . Social connections:    Talks on phone: Not on file    Gets together: Not on file    Attends religious service: Not on file    Active member of club or  organization: Not on file    Attends meetings of clubs or organizations: Not on file    Relationship status: Not on file  . Intimate partner violence:    Fear of current or ex partner: Not on file    Emotionally abused: Not on file    Physically abused: Not on file    Forced sexual activity: Not on file  Other Topics Concern  . Not on file  Social History Narrative   Exercise--  [employer]--yoga and cardio 3x a week    Past Surgical History:  Procedure Laterality Date  . EYE SURGERY  2012   B/L 01/2011  02/2011  . HAMMER TOE SURGERY     Bilateral  . ROTATOR CUFF REPAIR     Left  . TUBAL LIGATION      Family History  Problem Relation Age of Onset  . Hypertension Mother   . Cancer Mother 51       ?  Marland Kitchen Hyperlipidemia Mother   . Obesity Mother   . Hypertension Father   . Stroke Father   . Heart disease Father   .  Hypertension Brother   . Testicular cancer Brother 18  . Hypertension Brother   . Heart disease Brother        quadruple bypass  . Colon cancer Neg Hx   . Stomach cancer Neg Hx   . Throat cancer Neg Hx     No Known Allergies  Current Outpatient Medications on File Prior to Visit  Medication Sig Dispense Refill  . diphenhydramine-acetaminophen (TYLENOL PM) 25-500 MG TABS tablet Take 1 tablet by mouth at bedtime.    . hydrochlorothiazide (HYDRODIURIL) 25 MG tablet TAKE 1/2 TABLET BY MOUTH EVERY MORNING 45 tablet 1  . lisinopril (PRINIVIL,ZESTRIL) 20 MG tablet take 1 tablet by mouth once daily 90 tablet 1  . Multiple Vitamins-Minerals (ICAPS AREDS 2) CAPS Take 2 capsules by mouth daily.     . rosuvastatin (CRESTOR) 20 MG tablet TAKE 1 TABLET BY MOUTH EVERY DAY 90 tablet 1  . Vitamin D, Ergocalciferol, (DRISDOL) 1.25 MG (50000 UT) CAPS capsule Take 1 capsule (50,000 Units total) by mouth every 7 (seven) days. 4 capsule 0   No current facility-administered medications on file prior to visit.     There were no vitals taken for this visit.      Objective:   Physical Exam  General  Mental Status - Alert. General Appearance - Well groomed. Not in acute distress.  Skin Rashes- No Rashes.  HEENT Head- Normal. Ear Auditory Canal - Left- Normal. Right - Normal.Tympanic Membrane- Left- Normal. Right- Normal. Eye Sclera/Conjunctiva- Left- Normal. Right- Normal. Nose & Sinuses Nasal Mucosa- Left-  Boggy and Congested. Right-  Boggy and  Congested.Bilateral no  maxillary and no frontal sinus pressure. Mouth & Throat Lips: Upper Lip- Normal: no dryness, cracking, pallor, cyanosis, or vesicular eruption. Lower Lip-Normal: no dryness, cracking, pallor, cyanosis or vesicular eruption. Buccal Mucosa- Bilateral- No Aphthous ulcers. Oropharynx- No Discharge or Erythema. +pnd Tonsils: Characteristics- Bilateral- No Erythema or Congestion. Size/Enlargement- Bilateral- No enlargement. Discharge-  bilateral-None.  Neck Neck- Supple. No Masses.   Chest and Lung Exam Auscultation: Breath Sounds:-Clear even and unlabored.  Cardiovascular Auscultation:Rythm- Regular, rate and rhythm. Murmurs & Other Heart Sounds:Ausculatation of the heart reveal- No Murmurs.  Lymphatic Head & Neck General Head & Neck Lymphatics: Bilateral: Description- No Localized lymphadenopathy.       Assessment & Plan:  Your rapid flu test was negative.  Your clinical presentation  is not very suspicious for flu in light of the fact that myalgias have already resolved.   Overall you describe suffering from recent cold/URI followed by probable allergic rhinitis most recently.  On exam no obvious indication of sinus infection, ear infection or bronchitis.  I will prescribe you Flonase nasal spray and Xyzal antihistamine.  I decide to go ahead and make a azithromycin antibiotic available as a printed prescription.  You can take this if your symptoms worsen despite the above treatment.  Particularly if you develop sinus pressure, sore throat or productive cough.  You are very busy this upcoming week and I do not want you to miss those activities due to bacterial infection.  Follow-up in 7 to 10 days or as needed.  Mackie Pai, PA-C

## 2018-11-17 ENCOUNTER — Ambulatory Visit (INDEPENDENT_AMBULATORY_CARE_PROVIDER_SITE_OTHER): Payer: Medicare HMO | Admitting: Family Medicine

## 2018-11-17 ENCOUNTER — Encounter (INDEPENDENT_AMBULATORY_CARE_PROVIDER_SITE_OTHER): Payer: Self-pay | Admitting: Family Medicine

## 2018-11-17 VITALS — BP 135/76 | HR 68 | Temp 97.6°F | Ht 60.0 in | Wt 151.0 lb

## 2018-11-17 DIAGNOSIS — R7303 Prediabetes: Secondary | ICD-10-CM | POA: Diagnosis not present

## 2018-11-17 DIAGNOSIS — Z6830 Body mass index (BMI) 30.0-30.9, adult: Secondary | ICD-10-CM

## 2018-11-17 DIAGNOSIS — E663 Overweight: Secondary | ICD-10-CM | POA: Diagnosis not present

## 2018-11-17 DIAGNOSIS — E669 Obesity, unspecified: Secondary | ICD-10-CM

## 2018-11-17 DIAGNOSIS — E559 Vitamin D deficiency, unspecified: Secondary | ICD-10-CM

## 2018-11-17 MED ORDER — VITAMIN D (ERGOCALCIFEROL) 1.25 MG (50000 UNIT) PO CAPS: 50000.0000 [IU] | ORAL_CAPSULE | ORAL | 0 refills | Status: DC

## 2018-11-17 NOTE — Progress Notes (Signed)
Office: [phone]  /  Fax: [phone]   HPI:   Chief Complaint: OBESITY Cindy Velasquez is here to discuss her progress with her obesity treatment plan. She is following the Category 2 plan and is following her eating plan approximately 80 % of the time. She states she is exercising 0 minutes 0 times per week. Cindy Velasquez is struggling with eating too much at holiday dinner parties. She is eating a protein snack with dinner now, which has helped with after dinner polyphagia.  Her weight is 151 lb (68.5 kg) today and gained 1 lb since her last visit. She has lost 8 lbs since starting treatment with [place].  Vitamin D deficiency Cindy Velasquez has a diagnosis of vitamin D deficiency. She is currently taking prescription Vit D but is not yet at goal. She denies nausea, vomiting or muscle weakness.  Pre-Diabetes Cindy Velasquez has a diagnosis of prediabetes based on her elevated HgA1c and was informed this puts her at greater risk of developing diabetes. She is not taking metformin currently and continues to work on diet and exercise to decrease risk of diabetes. She denies nausea, polyphagia or hypoglycemia.   ALLERGIES: No Known Allergies  MEDICATIONS: Current Outpatient Medications on File Prior to Visit  Medication Sig Dispense Refill  . azithromycin (ZITHROMAX) 250 MG tablet Take 2 tablets by mouth on day 1, followed by 1 tablet by mouth daily for 4 days. 6 tablet 0  . diphenhydramine-acetaminophen (TYLENOL PM) 25-500 MG TABS tablet Take 1 tablet by mouth at bedtime.    . fluticasone (FLONASE) 50 MCG/ACT nasal spray Place 2 sprays into both nostrils daily. 16 g 1  . hydrochlorothiazide (HYDRODIURIL) 25 MG tablet TAKE 1/2 TABLET BY MOUTH EVERY MORNING 45 tablet 1  . levocetirizine (XYZAL) 5 MG tablet Take 1 tablet (5 mg total) by mouth every evening. 30 tablet 0  . lisinopril (PRINIVIL,ZESTRIL) 20 MG tablet take 1 tablet by mouth once daily 90 tablet 1  . Multiple Vitamins-Minerals (ICAPS AREDS 2) CAPS Take 2  capsules by mouth daily.     . rosuvastatin (CRESTOR) 20 MG tablet TAKE 1 TABLET BY MOUTH EVERY DAY 90 tablet 1   No current facility-administered medications on file prior to visit.     PAST MEDICAL HISTORY: Past Medical History:  Diagnosis Date  . Adenomatous colon polyp   . Cancer ([place]) 03/2015   BCC R tibia   . Diverticulosis of colon   . GERD (gastroesophageal reflux disease)   . Hip pain   . Hyperlipidemia   . Hyperplastic colon polyp   . Hypertension   . Lactose intolerance   . Loose stools   . Lower back pain   . Osteoporosis   . Palpitations     PAST SURGICAL HISTORY: Past Surgical History:  Procedure Laterality Date  . EYE SURGERY  2012   B/L 01/2011  02/2011  . HAMMER TOE SURGERY     Bilateral  . ROTATOR CUFF REPAIR     Left  . TUBAL LIGATION      SOCIAL HISTORY: Social History   Tobacco Use  . Smoking status: Former Smoker    Packs/day: 0.50    Years: 30.00    Pack years: 15.00    Types: Cigarettes    Last attempt to quit: 06/02/1991    Years since quitting: 27.4  . Smokeless tobacco: Never Used  Substance Use Topics  . Alcohol use: Yes    Alcohol/week: 5.0 standard drinks    Types: 5 Glasses of wine per  week  . Drug use: No    FAMILY HISTORY: Family History  Problem Relation Age of Onset  . Hypertension Mother   . Cancer Mother 63       ?  Marland Kitchen Hyperlipidemia Mother   . Obesity Mother   . Hypertension Father   . Stroke Father   . Heart disease Father   . Hypertension Brother   . Testicular cancer Brother 37  . Hypertension Brother   . Heart disease Brother        quadruple bypass  . Colon cancer Neg Hx   . Stomach cancer Neg Hx   . Throat cancer Neg Hx     ROS: Review of Systems  Constitutional: Positive for malaise/fatigue. Negative for weight loss.  Gastrointestinal: Negative for nausea and vomiting.  Musculoskeletal:       Negative for muscle weakness  Endo/Heme/Allergies:       Negative for polyphagia Negative for  hypoglycemia    PHYSICAL EXAM: Blood pressure 135/76, pulse 68, temperature 97.6 F (36.4 C), temperature source Oral, height 5' (1.524 m), weight 151 lb (68.5 kg), SpO2 96 %. Body mass index is 29.49 kg/m. Physical Exam  Constitutional: She is oriented to person, place, and time. She appears well-developed and well-nourished.  Cardiovascular: Normal rate.  Pulmonary/Chest: Effort normal.  Musculoskeletal: Normal range of motion.  Neurological: She is alert and oriented to person, place, and time.  Skin: Skin is warm and dry.  Psychiatric: She has a normal mood and affect. Her behavior is normal.  Vitals reviewed.   RECENT LABS AND TESTS: BMET    Component Value Date/Time   NA 140 09/03/2018 1452   K 4.2 09/03/2018 1452   CL 101 09/03/2018 1452   CO2 30 09/03/2018 1452   GLUCOSE 83 09/03/2018 1452   BUN 17 09/03/2018 1452   CREATININE 0.63 09/03/2018 1452   CALCIUM 10.1 09/03/2018 1452   GFRNONAA 75.66 04/03/2010 1516   GFRAA 107 12/28/2008 0000   Lab Results  Component Value Date   HGBA1C 5.8 (H) 09/03/2018   No results found for: INSULIN CBC    Component Value Date/Time   WBC 7.9 09/03/2018 1452   RBC 4.97 09/03/2018 1452   HGB 14.5 09/03/2018 1452   HCT 43.8 09/03/2018 1452   PLT 240 09/03/2018 1452   MCV 88.1 09/03/2018 1452   MCH 29.2 09/03/2018 1452   MCHC 33.1 09/03/2018 1452   RDW 13.1 09/03/2018 1452   LYMPHSABS 2,623 09/03/2018 1452   MONOABS 1.0 06/17/2018 1722   EOSABS 498 09/03/2018 1452   BASOSABS 79 09/03/2018 1452   Iron/TIBC/Ferritin/ %Sat No results found for: IRON, TIBC, FERRITIN, IRONPCTSAT Lipid Panel     Component Value Date/Time   CHOL 179 09/03/2018 1452   TRIG 208 (H) 09/03/2018 1452   HDL 41 (L) 09/03/2018 1452   CHOLHDL 4.4 09/03/2018 1452   VLDL 32.6 06/17/2018 1722   LDLCALC 106 (H) 09/03/2018 1452   LDLDIRECT 129.1 08/23/2007 0913   Hepatic Function Panel     Component Value Date/Time   PROT 7.5 09/03/2018 1452    ALBUMIN 4.1 06/17/2018 1722   AST 24 09/03/2018 1452   ALT 22 09/03/2018 1452   ALKPHOS 73 06/17/2018 1722   BILITOT 0.5 09/03/2018 1452   BILIDIR 0.1 02/19/2015 0847      Component Value Date/Time   TSH 1.75 09/03/2018 1452   TSH 1.95 06/17/2018 1722   TSH 1.71 07/02/2017 1109   Results for NIOMA, MCCUBBINS A (MRN [id number])  as of 11/17/2018 17:19  Ref. Range 09/03/2018 14:52  Vitamin D, 25-Hydroxy Latest Ref Range: 30 - 100 ng/mL 23 (L)   ASSESSMENT AND PLAN: Vitamin D deficiency - Plan: Vitamin D, Ergocalciferol, (DRISDOL) 1.25 MG (50000 UT) CAPS capsule  Prediabetes  Overweight (BMI 25.0-29.9) - Starting BMI greater then 30  PLAN: Vitamin D Deficiency Cindy Velasquez was informed that low vitamin D levels contributes to fatigue and are associated with obesity, breast, and colon cancer. She agrees to continue to take prescription Vit D @50 ,000 IU every week #4 with no refills. She will follow up for routine testing of vitamin D, at least 2-3 times per year. She was informed of the risk of over-replacement of vitamin D and agrees to not increase her dose unless she discusses this with [place] first. We will recheck her levels at her next appt. Cindy Velasquez has agreed to follow up with our office in 3-4 weeks.   Pre-Diabetes Cindy Velasquez will continue to work on weight loss, exercise, and decreasing simple carbohydrates in her diet to help decrease the risk of diabetes. We dicussed metformin including benefits and risks. She was informed that eating too many simple carbohydrates or too many calories at one sitting increases the likelihood of GI side effects. Kymberlee will continue her current meal plan and we will check her A1C at her next appt. Cindy Velasquez agreed to follow up with our office in 3-4 weeks.   Obesity Cindy Velasquez is currently in the action stage of change. As such, her goal is to continue with weight loss efforts She has agreed to follow the Category 2 plan Cindy Velasquez has been instructed to work up to a goal  of 150 minutes of combined cardio and strengthening exercise per week for weight loss and overall health benefits. We discussed the following Behavioral Modification Stratgies today: celebration eating strategies and planning for success  Cindy Velasquez has agreed to follow up with our clinic in 3-4 weeks. She was informed of the importance of frequent follow up visits to maximize her success with intensive lifestyle modifications for her multiple health conditions.   OBESITY BEHAVIORAL INTERVENTION VISIT  Today's visit was # 5   Starting weight: 158 lbs Starting date: 09/06/2018 Today's weight : Weight: 151 lb (68.5 kg)  Today's date: 11/18/2018 Total lbs lost to date: 8 lbs At least 15 minutes were spent on discussing the following behavioral intervention visit.   ASK: We discussed the diagnosis of obesity with Cindy Velasquez today and Cindy Velasquez agreed to give [place] permission to discuss obesity behavioral modification therapy today.  ASSESS: Cindy Velasquez has the diagnosis of obesity and her BMI today is 29.49 Cindy Velasquez is in the action stage of change   ADVISE: Cindy Velasquez was educated on the multiple health risks of obesity as well as the benefit of weight loss to improve her health. She was advised of the need for long term treatment and the importance of lifestyle modifications to improve her current health and to decrease her risk of future health problems.  AGREE: Multiple dietary modification options and treatment options were discussed and  Cindy Velasquez agreed to follow the recommendations documented in the above note.  ARRANGE: Shandria was educated on the importance of frequent visits to treat obesity as outlined per CMS and USPSTF guidelines and agreed to schedule her next follow up appointment today.  I, Cindy Velasquez, CMA, am acting as Location manager for [employer], FNP-C.  I have reviewed the above documentation for accuracy and completeness, and I agree with the above.  -  [employer],  FNP-C.

## 2018-11-18 ENCOUNTER — Encounter (INDEPENDENT_AMBULATORY_CARE_PROVIDER_SITE_OTHER): Payer: Self-pay | Admitting: Family Medicine

## 2018-11-23 ENCOUNTER — Encounter: Payer: Self-pay | Admitting: Internal Medicine

## 2018-12-07 ENCOUNTER — Encounter: Payer: Medicare HMO | Admitting: Internal Medicine

## 2018-12-09 ENCOUNTER — Encounter (INDEPENDENT_AMBULATORY_CARE_PROVIDER_SITE_OTHER): Payer: Self-pay | Admitting: Family Medicine

## 2018-12-09 ENCOUNTER — Ambulatory Visit (INDEPENDENT_AMBULATORY_CARE_PROVIDER_SITE_OTHER): Payer: Medicare HMO | Admitting: Family Medicine

## 2018-12-09 VITALS — BP 106/68 | HR 68 | Temp 97.9°F | Ht 60.0 in | Wt 149.0 lb

## 2018-12-09 DIAGNOSIS — E7849 Other hyperlipidemia: Secondary | ICD-10-CM

## 2018-12-09 DIAGNOSIS — Z6830 Body mass index (BMI) 30.0-30.9, adult: Secondary | ICD-10-CM | POA: Diagnosis not present

## 2018-12-09 DIAGNOSIS — E559 Vitamin D deficiency, unspecified: Secondary | ICD-10-CM | POA: Diagnosis not present

## 2018-12-09 DIAGNOSIS — E663 Overweight: Secondary | ICD-10-CM | POA: Diagnosis not present

## 2018-12-09 DIAGNOSIS — R7303 Prediabetes: Secondary | ICD-10-CM

## 2018-12-09 DIAGNOSIS — E669 Obesity, unspecified: Secondary | ICD-10-CM | POA: Diagnosis not present

## 2018-12-09 NOTE — Progress Notes (Signed)
Office: [phone]  /  Fax: [phone]   HPI:   Chief Complaint: OBESITY Cindy Velasquez is here to discuss her progress with her obesity treatment plan. She is on the Category 2 plan and is following her eating plan approximately 80% of the time. She states she is doing yoga, stretches, and working on balance for 60 minutes 1 times per week. Cindy Velasquez has done well over the holidays, but she has had some foods that weren't on the plan. She is now back on track.  Her weight is 149 lb (67.6 kg) today and has had a weight loss of 3 pounds over a period of 3 weeks since her last visit. She has lost 9 lbs since starting treatment with [place].  Pre-Diabetes Cindy Velasquez has a diagnosis of pre-diabetes based on her elevated Hgb A1c and was informed this puts her at greater risk of developing diabetes. She is not on metformin currently and continues to work on diet and exercise to decrease risk of diabetes. She denies polyphagia or hypoglycemia.  Hyperlipidemia Cindy Velasquez has hyperlipidemia and has been trying to improve her cholesterol levels with intensive lifestyle modification including a low saturated fat diet, exercise and weight loss. She is stable on Crestor and denies any chest pain, shortness of breath, or myalgias. LDL is at goal. Lab Results  Component Value Date   LDLCALC 90 12/09/2018     Vitamin D Deficiency Cindy Velasquez has a diagnosis of vitamin D deficiency. She is stable on prescription Vit D, but level is not at goal. Last Vit D level was 23. She denies nausea, vomiting or muscle weakness.  ALLERGIES: No Known Allergies  MEDICATIONS: Current Outpatient Medications on File Prior to Visit  Medication Sig Dispense Refill  . azithromycin (ZITHROMAX) 250 MG tablet Take 2 tablets by mouth on day 1, followed by 1 tablet by mouth daily for 4 days. 6 tablet 0  . diphenhydramine-acetaminophen (TYLENOL PM) 25-500 MG TABS tablet Take 1 tablet by mouth at bedtime.    . fluticasone (FLONASE) 50 MCG/ACT nasal  spray Place 2 sprays into both nostrils daily. 16 g 1  . hydrochlorothiazide (HYDRODIURIL) 25 MG tablet TAKE 1/2 TABLET BY MOUTH EVERY MORNING 45 tablet 1  . levocetirizine (XYZAL) 5 MG tablet Take 1 tablet (5 mg total) by mouth every evening. 30 tablet 0  . lisinopril (PRINIVIL,ZESTRIL) 20 MG tablet take 1 tablet by mouth once daily 90 tablet 1  . Multiple Vitamins-Minerals (ICAPS AREDS 2) CAPS Take 2 capsules by mouth daily.     . rosuvastatin (CRESTOR) 20 MG tablet TAKE 1 TABLET BY MOUTH EVERY DAY 90 tablet 1  . Vitamin D, Ergocalciferol, (DRISDOL) 1.25 MG (50000 UT) CAPS capsule Take 1 capsule (50,000 Units total) by mouth every 7 (seven) days. 4 capsule 0   No current facility-administered medications on file prior to visit.     PAST MEDICAL HISTORY: Past Medical History:  Diagnosis Date  . Adenomatous colon polyp   . Cancer ([place]) 03/2015   BCC R tibia   . Diverticulosis of colon   . GERD (gastroesophageal reflux disease)   . Hip pain   . Hyperlipidemia   . Hyperplastic colon polyp   . Hypertension   . Lactose intolerance   . Loose stools   . Lower back pain   . Osteoporosis   . Palpitations     PAST SURGICAL HISTORY: Past Surgical History:  Procedure Laterality Date  . EYE SURGERY  2012   B/L 01/2011  02/2011  . HAMMER  TOE SURGERY     Bilateral  . ROTATOR CUFF REPAIR     Left  . TUBAL LIGATION      SOCIAL HISTORY: Social History   Tobacco Use  . Smoking status: Former Smoker    Packs/day: 0.50    Years: 30.00    Pack years: 15.00    Types: Cigarettes    Last attempt to quit: 06/02/1991    Years since quitting: 27.5  . Smokeless tobacco: Never Used  Substance Use Topics  . Alcohol use: Yes    Alcohol/week: 5.0 standard drinks    Types: 5 Glasses of wine per week  . Drug use: No    FAMILY HISTORY: Family History  Problem Relation Age of Onset  . Hypertension Mother   . Cancer Mother 21       ?  Marland Kitchen Hyperlipidemia Mother   . Obesity Mother   .  Hypertension Father   . Stroke Father   . Heart disease Father   . Hypertension Brother   . Testicular cancer Brother 66  . Hypertension Brother   . Heart disease Brother        quadruple bypass  . Colon cancer Neg Hx   . Stomach cancer Neg Hx   . Throat cancer Neg Hx     ROS: Review of Systems  Constitutional: Positive for weight loss.  Respiratory: Negative for shortness of breath.   Cardiovascular: Negative for chest pain.  Gastrointestinal: Negative for nausea and vomiting.  Musculoskeletal: Negative for myalgias.       Negative muscle weakness  Endo/Heme/Allergies:       Negative polyphagia Negative hypoglycemia    PHYSICAL EXAM: Blood pressure 106/68, pulse 68, temperature 97.9 F (36.6 C), temperature source Oral, height 5' (1.524 m), weight 149 lb (67.6 kg), SpO2 96 %. Body mass index is 29.1 kg/m. Physical Exam Vitals signs reviewed.  Constitutional:      Appearance: Normal appearance. She is obese.  Cardiovascular:     Rate and Rhythm: Normal rate.     Pulses: Normal pulses.  Pulmonary:     Effort: Pulmonary effort is normal.  Musculoskeletal: Normal range of motion.  Skin:    General: Skin is warm and dry.  Neurological:     Mental Status: She is alert and oriented to person, place, and time.  Psychiatric:        Mood and Affect: Mood normal.        Behavior: Behavior normal.     RECENT LABS AND TESTS: BMET    Component Value Date/Time   NA 143 12/09/2018 1043   K 5.2 12/09/2018 1043   CL 100 12/09/2018 1043   CO2 28 12/09/2018 1043   GLUCOSE 92 12/09/2018 1043   GLUCOSE 83 09/03/2018 1452   BUN 15 12/09/2018 1043   CREATININE 0.76 12/09/2018 1043   CREATININE 0.63 09/03/2018 1452   CALCIUM 10.1 12/09/2018 1043   GFRNONAA 76 12/09/2018 1043   GFRAA 88 12/09/2018 1043   Lab Results  Component Value Date   HGBA1C 5.7 (H) 12/09/2018   HGBA1C 5.8 (H) 09/03/2018   Lab Results  Component Value Date   INSULIN 7.3 12/09/2018   CBC      Component Value Date/Time   WBC 7.9 09/03/2018 1452   RBC 4.97 09/03/2018 1452   HGB 14.5 09/03/2018 1452   HCT 43.8 09/03/2018 1452   PLT 240 09/03/2018 1452   MCV 88.1 09/03/2018 1452   MCH 29.2 09/03/2018 1452   MCHC 33.1  09/03/2018 1452   RDW 13.1 09/03/2018 1452   LYMPHSABS 2,623 09/03/2018 1452   MONOABS 1.0 06/17/2018 1722   EOSABS 498 09/03/2018 1452   BASOSABS 79 09/03/2018 1452   Iron/TIBC/Ferritin/ %Sat No results found for: IRON, TIBC, FERRITIN, IRONPCTSAT Lipid Panel     Component Value Date/Time   CHOL 165 12/09/2018 1043   TRIG 169 (H) 12/09/2018 1043   HDL 41 12/09/2018 1043   CHOLHDL 4.4 09/03/2018 1452   VLDL 32.6 06/17/2018 1722   LDLCALC 90 12/09/2018 1043   LDLCALC 106 (H) 09/03/2018 1452   LDLDIRECT 129.1 08/23/2007 0913   Hepatic Function Panel     Component Value Date/Time   PROT 7.5 12/09/2018 1043   ALBUMIN 4.4 12/09/2018 1043   AST 28 12/09/2018 1043   ALT 33 (H) 12/09/2018 1043   ALKPHOS 103 12/09/2018 1043   BILITOT 0.5 12/09/2018 1043   BILIDIR 0.1 02/19/2015 0847      Component Value Date/Time   TSH 1.75 09/03/2018 1452   TSH 1.95 06/17/2018 1722   TSH 1.71 07/02/2017 1109    ASSESSMENT AND PLAN: Prediabetes - Plan: Comprehensive metabolic panel, Hemoglobin A1c, Insulin, random  Other hyperlipidemia - Plan: Lipid Panel With LDL/HDL Ratio  Vitamin D deficiency - Plan: VITAMIN D 25 Hydroxy (Vit-D Deficiency, Fractures)  Overweight (BMI 25.0-29.9) - Starting BMI greater then 30  PLAN:  Pre-Diabetes Jocelin will continue to work on weight loss, exercise, and decreasing simple carbohydrates in her diet to help decrease the risk of diabetes. We dicussed metformin including benefits and risks. She was informed that eating too many simple carbohydrates or too many calories at one sitting increases the likelihood of GI side effects. Lurene declined metformin for now and a prescription was not written today. We will check A1c, fasting  insulin, and fasting glucose today. Airika agrees to follow up with our clinic in 2 weeks as directed to monitor her progress.  Hyperlipidemia Dulce was informed of the American Heart Association Guidelines emphasizing intensive lifestyle modifications as the first line treatment for hyperlipidemia. We discussed many lifestyle modifications today in depth, and Vandella will continue to work on decreasing saturated fats such as fatty red meat, butter and many fried foods. She will also increase vegetables and lean protein in her diet and continue to work on exercise and weight loss efforts. She will continue Crestor. We will check FLP today. Grady agrees to follow up with our clinic in 2 weeks.  Vitamin D Deficiency Aalina was informed that low vitamin D levels contributes to fatigue and are associated with obesity, breast, and colon cancer. Candice agrees to continue taking prescription Vit D @50 ,000 IU every week and will follow up for routine testing of vitamin D, at least 2-3 times per year. She was informed of the risk of over-replacement of vitamin D and agrees to not increase her dose unless she discusses this with [place] first. We will check Vit D level today. Maryelizabeth agrees to follow up with our clinic in 2 weeks.  Obesity Alaria is currently in the action stage of change. As such, her goal is to continue with weight loss efforts She has agreed to follow the Category 2 plan Moet has been instructed to work up to a goal of 150 minutes of combined cardio and strengthening exercise per week or as above for weight loss and overall health benefits. We discussed the following Behavioral Modification Strategies today: planning for success   Kynnedi has agreed to follow up with our clinic  in 2 weeks. She was informed of the importance of frequent follow up visits to maximize her success with intensive lifestyle modifications for her multiple health conditions.   OBESITY BEHAVIORAL INTERVENTION  VISIT  Today's visit was # 6  Starting weight: 158 lbs Starting date: 09/06/18 Today's weight : 149 lbs Today's date: 12/13/2018 Total lbs lost to date: 9 At least 15 minutes were spent on discussing the following behavioral intervention visit.   ASK: We discussed the diagnosis of obesity with Ilda Mori today and Estellar agreed to give [place] permission to discuss obesity behavioral modification therapy today.  ASSESS: Kaysee has the diagnosis of obesity and her BMI today is 29.1 Faylynn is in the action stage of change   ADVISE: Grover was educated on the multiple health risks of obesity as well as the benefit of weight loss to improve her health. She was advised of the need for long term treatment and the importance of lifestyle modifications to improve her current health and to decrease her risk of future health problems.  AGREE: Multiple dietary modification options and treatment options were discussed and  Azarria agreed to follow the recommendations documented in the above note.  ARRANGE: Shristi was educated on the importance of frequent visits to treat obesity as outlined per CMS and USPSTF guidelines and agreed to schedule her next follow up appointment today.  Wilhemena Durie, am acting as Location manager for [employer], FNP-C.  I have reviewed the above documentation for accuracy and completeness, and I agree with the above.  - Tamar Lipscomb, FNP-C.

## 2018-12-10 LAB — COMPREHENSIVE METABOLIC PANEL
ALT: 33 IU/L — ABNORMAL HIGH (ref 0–32)
AST: 28 IU/L (ref 0–40)
Albumin/Globulin Ratio: 1.4 (ref 1.2–2.2)
Albumin: 4.4 g/dL (ref 3.5–4.8)
Alkaline Phosphatase: 103 IU/L (ref 39–117)
BUN/Creatinine Ratio: 20 (ref 12–28)
BUN: 15 mg/dL (ref 8–27)
Bilirubin Total: 0.5 mg/dL (ref 0.0–1.2)
CO2: 28 mmol/L (ref 20–29)
Calcium: 10.1 mg/dL (ref 8.7–10.3)
Chloride: 100 mmol/L (ref 96–106)
Creatinine, Ser: 0.76 mg/dL (ref 0.57–1.00)
GFR calc Af Amer: 88 mL/min/{1.73_m2} (ref >59)
GFR calc non Af Amer: 76 mL/min/{1.73_m2} (ref >59)
Globulin, Total: 3.1 g/dL (ref 1.5–4.5)
Glucose: 92 mg/dL (ref 65–99)
Potassium: 5.2 mmol/L (ref 3.5–5.2)
Sodium: 143 mmol/L (ref 134–144)
Total Protein: 7.5 g/dL (ref 6.0–8.5)

## 2018-12-10 LAB — HEMOGLOBIN A1C
Est. average glucose Bld gHb Est-mCnc: 117 mg/dL
Hgb A1c MFr Bld: 5.7 % — ABNORMAL HIGH (ref 4.8–5.6)

## 2018-12-10 LAB — LIPID PANEL WITH LDL/HDL RATIO
Cholesterol, Total: 165 mg/dL (ref 100–199)
HDL: 41 mg/dL (ref >39)
LDL Calculated: 90 mg/dL (ref 0–99)
LDl/HDL Ratio: 2.2 ratio (ref 0.0–3.2)
Triglycerides: 169 mg/dL — ABNORMAL HIGH (ref 0–149)
VLDL Cholesterol Cal: 34 mg/dL (ref 5–40)

## 2018-12-10 LAB — VITAMIN D 25 HYDROXY (VIT D DEFICIENCY, FRACTURES): Vit D, 25-Hydroxy: 43.3 ng/mL (ref 30.0–100.0)

## 2018-12-10 LAB — INSULIN, RANDOM: INSULIN: 7.3 u[IU]/mL (ref 2.6–24.9)

## 2018-12-13 ENCOUNTER — Encounter (INDEPENDENT_AMBULATORY_CARE_PROVIDER_SITE_OTHER): Payer: Self-pay | Admitting: Family Medicine

## 2018-12-13 DIAGNOSIS — E663 Overweight: Secondary | ICD-10-CM | POA: Insufficient documentation

## 2018-12-13 DIAGNOSIS — E559 Vitamin D deficiency, unspecified: Secondary | ICD-10-CM | POA: Insufficient documentation

## 2018-12-13 DIAGNOSIS — R7303 Prediabetes: Secondary | ICD-10-CM | POA: Insufficient documentation

## 2018-12-13 NOTE — Addendum Note (Signed)
Addended by: Georgianne Fick on: 12/13/2018 09:27 AM   Modules accepted: Orders

## 2018-12-15 ENCOUNTER — Telehealth: Payer: Self-pay | Admitting: Internal Medicine

## 2018-12-15 NOTE — Telephone Encounter (Signed)
Pt called with questions about her blood pressure reading for today and want to know if the measurements were to low for the procedure.

## 2018-12-15 NOTE — Telephone Encounter (Signed)
Pt states she has been checking her BP at the drug store and it was "101/unsure of the bottom number."  She denies feeling dizzy.  I told her to push her fluids and that there is a lot of sodium in the prep.  She will call if she has any problems.

## 2018-12-16 ENCOUNTER — Ambulatory Visit (AMBULATORY_SURGERY_CENTER): Payer: Medicare HMO | Admitting: Internal Medicine

## 2018-12-16 ENCOUNTER — Encounter: Payer: Self-pay | Admitting: Internal Medicine

## 2018-12-16 VITALS — BP 113/49 | HR 78 | Temp 98.2°F | Resp 11 | Ht 60.0 in | Wt 149.0 lb

## 2018-12-16 DIAGNOSIS — Z86010 Personal history of colonic polyps: Secondary | ICD-10-CM

## 2018-12-16 DIAGNOSIS — I10 Essential (primary) hypertension: Secondary | ICD-10-CM | POA: Diagnosis not present

## 2018-12-16 DIAGNOSIS — K219 Gastro-esophageal reflux disease without esophagitis: Secondary | ICD-10-CM | POA: Diagnosis not present

## 2018-12-16 DIAGNOSIS — M545 Low back pain: Secondary | ICD-10-CM | POA: Diagnosis not present

## 2018-12-16 DIAGNOSIS — R197 Diarrhea, unspecified: Secondary | ICD-10-CM | POA: Diagnosis not present

## 2018-12-16 DIAGNOSIS — K599 Functional intestinal disorder, unspecified: Secondary | ICD-10-CM

## 2018-12-16 MED ORDER — SODIUM CHLORIDE 0.9 % IV SOLN: 500.0000 mL | Freq: Once | INTRAVENOUS | Status: DC

## 2018-12-16 MED ORDER — COLESTIPOL HCL 5 G PO GRAN: 1.0000 g | GRANULES | Freq: Two times a day (BID) | ORAL | 6 refills | Status: DC

## 2018-12-16 NOTE — Progress Notes (Signed)
Called to room to assist during endoscopic procedure.  Patient ID and intended procedure confirmed with present staff. Received instructions for my participation in the procedure from the performing physician.  

## 2018-12-16 NOTE — Progress Notes (Signed)
No problems noted in the recovery room. maw 

## 2018-12-16 NOTE — Progress Notes (Signed)
Pt's states no medical or surgical changes since previsit or office visit. 

## 2018-12-16 NOTE — Op Note (Signed)
[facility] Patient Name: Cindy Velasquez Procedure Date: 12/16/2018 8:34 AM MRN: [id number] Endoscopist: Docia Chuck. Henrene Pastor , MD Age: 78 Referring MD:  Date of Birth: 14-Nov-1941 Gender: Female Account #: [id number] Procedure:                Colonoscopy with biopsies Indications:              Chronic diarrhea. Also history of adenomatous colon                            polyp 2008. Negative examination 2011 Medicines:                Monitored Anesthesia Care Procedure:                Pre-Anesthesia Assessment:                           - Prior to the procedure, a History and Physical                            was performed, and patient medications and                            allergies were reviewed. The patient's tolerance of                            previous anesthesia was also reviewed. The risks                            and benefits of the procedure and the sedation                            options and risks were discussed with the patient.                            All questions were answered, and informed consent                            was obtained. Prior Anticoagulants: The patient has                            taken no previous anticoagulant or antiplatelet                            agents. ASA Grade Assessment: II - A patient with                            mild systemic disease. After reviewing the risks                            and benefits, the patient was deemed in                            satisfactory condition to undergo the procedure.  After obtaining informed consent, the colonoscope                            was passed under direct vision. Throughout the                            procedure, the patient's blood pressure, pulse, and                            oxygen saturations were monitored continuously. The                            Colonoscope was introduced through the anus and                            advanced to  the the cecum, identified by                            appendiceal orifice and ileocecal valve. The                            ileocecal valve, appendiceal orifice, and rectum                            were photographed. The quality of the bowel                            preparation was excellent. The colonoscopy was                            performed without difficulty. The patient tolerated                            the procedure well. The bowel preparation used was                            SUPREP. Scope In: 8:47:59 AM Scope Out: 9:03:25 AM Scope Withdrawal Time: 0 hours 9 minutes 49 seconds  Total Procedure Duration: 0 hours 15 minutes 26 seconds  Findings:                 Multiple diverticula were found in the left colon.                           The entire examined colon appeared otherwise normal                            on direct and retroflexion views. Biopsies for                            histology were taken with a cold forceps from the                            entire colon for evaluation of microscopic colitis. Complications:  No immediate complications. Estimated blood loss:                            None. Estimated Blood Loss:     Estimated blood loss: none. Impression:               - Diverticulosis in the left colon.                           - The entire examined colon is otherwise normal on                            direct and retroflexion views. Recommendation:           - Repeat colonoscopy is not recommended for                            surveillance.                           - Patient has a contact number available for                            emergencies. The signs and symptoms of potential                            delayed complications were discussed with the                            patient. Return to normal activities tomorrow.                            Written discharge instructions were provided to the                             patient.                           - Resume previous diet.                           - Continue present medications.                           - Await pathology results.                           - Prescribed Colestid 1 g; #120; 2 by mouth twice                            daily; 6 refills                           - Office follow-up with Dr. Henrene Pastor in 4 to 6 weeks Docia Chuck. Henrene Pastor, MD 12/16/2018 9:09:22 AM This report has been signed electronically.

## 2018-12-16 NOTE — Patient Instructions (Addendum)
YOU HAD AN ENDOSCOPIC PROCEDURE TODAY AT [facility]:   Refer to the procedure report that was given to you for any specific questions about what was found during the examination.  If the procedure report does not answer your questions, please call your gastroenterologist to clarify.  If you requested that your care partner not be given the details of your procedure findings, then the procedure report has been included in a sealed envelope for you to review at your convenience later.  YOU SHOULD EXPECT: Some feelings of bloating in the abdomen. Passage of more gas than usual.  Walking can help get rid of the air that was put into your GI tract during the procedure and reduce the bloating. If you had a lower endoscopy (such as a colonoscopy or flexible sigmoidoscopy) you may notice spotting of blood in your stool or on the toilet paper. If you underwent a bowel prep for your procedure, you may not have a normal bowel movement for a few days.  Please Note:  You might notice some irritation and congestion in your nose or some drainage.  This is from the oxygen used during your procedure.  There is no need for concern and it should clear up in a day or so.  SYMPTOMS TO REPORT IMMEDIATELY:   Following lower endoscopy (colonoscopy or flexible sigmoidoscopy):  Excessive amounts of blood in the stool  Significant tenderness or worsening of abdominal pains  Swelling of the abdomen that is new, acute  Fever of 100F or higher    For urgent or emergent issues, a gastroenterologist can be reached at any hour by calling [phone].   DIET:  We do recommend a small meal at first, but then you may proceed to your regular diet.  Drink plenty of fluids but you should avoid alcoholic beverages for 24 hours.  ACTIVITY:  You should plan to take it easy for the rest of today and you should NOT DRIVE or use heavy machinery until tomorrow (because of the sedation medicines used during the test).     FOLLOW UP: Our staff will call the number listed on your records the next business day following your procedure to check on you and address any questions or concerns that you may have regarding the information given to you following your procedure. If we do not reach you, we will leave a message.  However, if you are feeling well and you are not experiencing any problems, there is no need to return our call.  We will assume that you have returned to your regular daily activities without incident.  If any biopsies were taken you will be contacted by phone or by letter within the next 1-3 weeks.  Please call us at [phone] if you have not heard about the biopsies in 3 weeks.    SIGNATURES/CONFIDENTIALITY: You and/or your care partner have signed paperwork which will be entered into your electronic medical record.  These signatures attest to the fact that that the information above on your After Visit Summary has been reviewed and is understood.  Full responsibility of the confidentiality of this discharge information lies with you and/or your care-partner.    Handouts were given to your care partner on diverticulosis. You may resume your current medications today. Rx was set to [facility].  Please pick up rx. Await biopsies. Please call if any questions or concerns.

## 2018-12-16 NOTE — Progress Notes (Signed)
PT taken to PACU. Monitors in place. VSS. Report given to RN. 

## 2018-12-17 ENCOUNTER — Telehealth: Payer: Self-pay

## 2018-12-17 NOTE — Telephone Encounter (Signed)
  Follow up Call-  Call back number 12/16/2018  Post procedure Call Back phone  # [phone]  Permission to leave phone message Yes  Some recent data might be hidden     Patient questions:  Do you have a fever, pain , or abdominal swelling? No. Pain Score  0 *  Have you tolerated food without any problems? Yes.    Have you been able to return to your normal activities? Yes.    Do you have any questions about your discharge instructions: Diet   No. Medications  No. Follow up visit  No.  Do you have questions or concerns about your Care? No.  Actions: * If pain score is 4 or above: No action needed, pain <4.

## 2018-12-18 ENCOUNTER — Telehealth: Payer: Self-pay | Admitting: Internal Medicine

## 2018-12-18 MED ORDER — COLESTIPOL HCL 1 G PO TABS: 2.0000 g | ORAL_TABLET | Freq: Two times a day (BID) | ORAL | 1 refills | Status: DC

## 2018-12-18 NOTE — Telephone Encounter (Signed)
Confused by Rx instructions I resubmitted

## 2018-12-20 ENCOUNTER — Telehealth: Payer: Self-pay

## 2018-12-20 NOTE — Telephone Encounter (Signed)
Called and spoke with patient-patient has been scheduled for her post colonoscopy appt with Dr. Henrene Pastor on 01/18/2019 arrival at 10:30 am for 10:45 am appt; Patient verbalized understanding of information/instructions; Patient was advised to call back if questions/concerns arise;

## 2018-12-21 ENCOUNTER — Ambulatory Visit (INDEPENDENT_AMBULATORY_CARE_PROVIDER_SITE_OTHER): Payer: Medicare HMO | Admitting: Physician Assistant

## 2018-12-21 ENCOUNTER — Encounter (INDEPENDENT_AMBULATORY_CARE_PROVIDER_SITE_OTHER): Payer: Self-pay | Admitting: Physician Assistant

## 2018-12-21 VITALS — BP 116/68 | HR 71 | Temp 98.1°F | Ht 60.0 in | Wt 150.0 lb

## 2018-12-21 DIAGNOSIS — E7849 Other hyperlipidemia: Secondary | ICD-10-CM

## 2018-12-21 DIAGNOSIS — Z6830 Body mass index (BMI) 30.0-30.9, adult: Secondary | ICD-10-CM

## 2018-12-21 DIAGNOSIS — E669 Obesity, unspecified: Secondary | ICD-10-CM | POA: Diagnosis not present

## 2018-12-21 DIAGNOSIS — E559 Vitamin D deficiency, unspecified: Secondary | ICD-10-CM

## 2018-12-21 MED ORDER — VITAMIN D (ERGOCALCIFEROL) 1.25 MG (50000 UNIT) PO CAPS: 50000.0000 [IU] | ORAL_CAPSULE | ORAL | 0 refills | Status: DC

## 2018-12-21 NOTE — Progress Notes (Signed)
Office: [phone]  /  Fax: [phone]   HPI:   Chief Complaint: OBESITY Cindy Velasquez is here to discuss her progress with her obesity treatment plan. She is on the Category 2 plan and is following her eating plan approximately 60 % of the time. She states she is doing chair yoga and strength balance 60 minutes 3 times per week. Cindy Velasquez reports that she had a colonoscopy recently which caused her to eat off plan. She notes some intermittent night time hunger, and is eating 6 ounces of protein at night rather than 8 ounces. Her weight is 150 lb (68 kg) today and has gained 1 lbs since her last visit. She has lost 8 lbs since starting treatment with [place].  Vitamin D deficiency Cindy Velasquez has a diagnosis of vitamin D deficiency. Her last level was not at goal. She is currently taking prescription Vit D, she denies nausea, vomiting or muscle weakness.  Hyperlipidemia Cindy Velasquez has hyperlipidemia and has been trying to improve her cholesterol levels with intensive lifestyle modification including a low saturated fat diet, exercise and weight loss. She denies any chest pain, claudication or myalgias. She is currently taking rosuvastatin.   ASSESSMENT AND PLAN:  Vitamin D deficiency - Plan: Vitamin D, Ergocalciferol, (DRISDOL) 1.25 MG (50000 UT) CAPS capsule  Other hyperlipidemia  Class 1 obesity with serious comorbidity and body mass index (BMI) of 30.0 to 30.9 in adult, unspecified obesity type - Starting BMI greater then 30  PLAN:  Vitamin D Deficiency Cindy Velasquez was informed that low vitamin D levels contributes to fatigue and are associated with obesity, breast, and colon cancer. She agrees to continue to take prescription Vit D @50 ,000 IU every week #4 with no refills and will follow up for routine testing of vitamin D, at least 2-3 times per year. She was informed of the risk of over-replacement of vitamin D and agrees to not increase her dose unless she discusses this with [place] first. Laural agrees to  follow up with our clinic in 2 weeks.  Hyperlipidemia Cindy Velasquez was informed of the American Heart Association Guidelines emphasizing intensive lifestyle modifications as the first line treatment for hyperlipidemia. We discussed many lifestyle modifications today in depth, and Carisha will continue to work on decreasing saturated fats such as fatty red meat, butter and many fried foods. She will also increase vegetables and lean protein in her diet and continue to work on exercise and weight loss efforts. Cindy Velasquez agrees to continue taking cholesterol medication and follow up with our clinic in 2 weeks.   Obesity Cindy Velasquez is currently in the action stage of change. As such, her goal is to continue with weight loss efforts She has agreed to follow the Category 2 plan Cindy Velasquez has been instructed to work up to a goal of 150 minutes of combined cardio and strengthening exercise per week for weight loss and overall health benefits. We discussed the following Behavioral Modification Strategies today: work on meal planning and easy cooking plans an better snacking choices.  Cindy Velasquez has agreed to follow up with our clinic in 2 weeks. She was informed of the importance of frequent follow up visits to maximize her success with intensive lifestyle modifications for her multiple health conditions.  ALLERGIES: No Known Allergies  MEDICATIONS: Current Outpatient Medications on File Prior to Visit  Medication Sig Dispense Refill  . colestipol (COLESTID) 1 g tablet Take 2 tablets (2 g total) by mouth 2 (two) times daily. 120 tablet 1  . diphenhydramine-acetaminophen (TYLENOL PM) 25-500 MG TABS  tablet Take 1 tablet by mouth at bedtime.    . hydrochlorothiazide (HYDRODIURIL) 25 MG tablet TAKE 1/2 TABLET BY MOUTH EVERY MORNING 45 tablet 1  . lisinopril (PRINIVIL,ZESTRIL) 20 MG tablet take 1 tablet by mouth once daily 90 tablet 1  . Multiple Vitamins-Minerals (ICAPS AREDS 2) CAPS Take 2 capsules by mouth daily.     .  rosuvastatin (CRESTOR) 20 MG tablet TAKE 1 TABLET BY MOUTH EVERY DAY 90 tablet 1   No current facility-administered medications on file prior to visit.     PAST MEDICAL HISTORY: Past Medical History:  Diagnosis Date  . Adenomatous colon polyp   . Cancer ([place]) 03/2015   BCC R tibia   . Diverticulosis of colon   . GERD (gastroesophageal reflux disease)   . Hip pain   . Hyperlipidemia   . Hyperplastic colon polyp   . Hypertension   . Lactose intolerance   . Loose stools   . Lower back pain   . Osteoporosis   . Palpitations     PAST SURGICAL HISTORY: Past Surgical History:  Procedure Laterality Date  . EYE SURGERY  2012   B/L 01/2011  02/2011  . HAMMER TOE SURGERY     Bilateral  . ROTATOR CUFF REPAIR     Left  . TUBAL LIGATION      SOCIAL HISTORY: Social History   Tobacco Use  . Smoking status: Former Smoker    Packs/day: 0.50    Years: 30.00    Pack years: 15.00    Types: Cigarettes    Last attempt to quit: 06/02/1991    Years since quitting: 27.5  . Smokeless tobacco: Never Used  Substance Use Topics  . Alcohol use: Yes    Alcohol/week: 5.0 standard drinks    Types: 5 Glasses of wine per week  . Drug use: No    FAMILY HISTORY: Family History  Problem Relation Age of Onset  . Hypertension Mother   . Cancer Mother 70       ?  Marland Kitchen Hyperlipidemia Mother   . Obesity Mother   . Hypertension Father   . Stroke Father   . Heart disease Father   . Hypertension Brother   . Testicular cancer Brother 58  . Hypertension Brother   . Heart disease Brother        quadruple bypass  . Colon cancer Neg Hx   . Stomach cancer Neg Hx   . Throat cancer Neg Hx     ROS: Review of Systems  Constitutional: Negative for weight loss.  Cardiovascular: Negative for chest pain and claudication.  Gastrointestinal: Negative for nausea and vomiting.  Musculoskeletal: Negative for myalgias.       Negative for muscle weakness    PHYSICAL EXAM: Blood pressure 116/68, pulse 71,  temperature 98.1 F (36.7 C), temperature source Oral, height 5' (1.524 m), weight 150 lb (68 kg), SpO2 97 %. Body mass index is 29.29 kg/m. Physical Exam Vitals signs reviewed.  Constitutional:      Appearance: Normal appearance. She is obese.  Cardiovascular:     Rate and Rhythm: Normal rate.     Pulses: Normal pulses.  Pulmonary:     Effort: Pulmonary effort is normal.  Musculoskeletal: Normal range of motion.  Skin:    General: Skin is warm and dry.  Neurological:     Mental Status: She is alert and oriented to person, place, and time.  Psychiatric:        Mood and Affect: Mood normal.  Behavior: Behavior normal.     RECENT LABS AND TESTS: BMET    Component Value Date/Time   NA 143 12/09/2018 1043   K 5.2 12/09/2018 1043   CL 100 12/09/2018 1043   CO2 28 12/09/2018 1043   GLUCOSE 92 12/09/2018 1043   GLUCOSE 83 09/03/2018 1452   BUN 15 12/09/2018 1043   CREATININE 0.76 12/09/2018 1043   CREATININE 0.63 09/03/2018 1452   CALCIUM 10.1 12/09/2018 1043   GFRNONAA 76 12/09/2018 1043   GFRAA 88 12/09/2018 1043   Lab Results  Component Value Date   HGBA1C 5.7 (H) 12/09/2018   HGBA1C 5.8 (H) 09/03/2018   Lab Results  Component Value Date   INSULIN 7.3 12/09/2018   CBC    Component Value Date/Time   WBC 7.9 09/03/2018 1452   RBC 4.97 09/03/2018 1452   HGB 14.5 09/03/2018 1452   HCT 43.8 09/03/2018 1452   PLT 240 09/03/2018 1452   MCV 88.1 09/03/2018 1452   MCH 29.2 09/03/2018 1452   MCHC 33.1 09/03/2018 1452   RDW 13.1 09/03/2018 1452   LYMPHSABS 2,623 09/03/2018 1452   MONOABS 1.0 06/17/2018 1722   EOSABS 498 09/03/2018 1452   BASOSABS 79 09/03/2018 1452   Iron/TIBC/Ferritin/ %Sat No results found for: IRON, TIBC, FERRITIN, IRONPCTSAT Lipid Panel     Component Value Date/Time   CHOL 165 12/09/2018 1043   TRIG 169 (H) 12/09/2018 1043   HDL 41 12/09/2018 1043   CHOLHDL 4.4 09/03/2018 1452   VLDL 32.6 06/17/2018 1722   LDLCALC 90 12/09/2018  1043   LDLCALC 106 (H) 09/03/2018 1452   LDLDIRECT 129.1 08/23/2007 0913   Hepatic Function Panel     Component Value Date/Time   PROT 7.5 12/09/2018 1043   ALBUMIN 4.4 12/09/2018 1043   AST 28 12/09/2018 1043   ALT 33 (H) 12/09/2018 1043   ALKPHOS 103 12/09/2018 1043   BILITOT 0.5 12/09/2018 1043   BILIDIR 0.1 02/19/2015 0847      Component Value Date/Time   TSH 1.75 09/03/2018 1452   TSH 1.95 06/17/2018 1722   TSH 1.71 07/02/2017 1109     Ref. Range 12/09/2018 10:43  Vitamin D, 25-Hydroxy Latest Ref Range: 30.0 - 100.0 ng/mL 43.3     OBESITY BEHAVIORAL INTERVENTION VISIT  Today's visit was # 7   Starting weight: 158 lbs Starting date: 09/06/2018 Today's weight :: 150 lb  Today's date: 12/21/2018 Total lbs lost to date: 8 lbs  At least 15 minutes were spent on discussing the following behavioral intervention visit.  ASK: We discussed the diagnosis of obesity with Ilda Mori today and Kimmie agreed to give [place] permission to discuss obesity behavioral modification therapy today.  ASSESS: Shalin has the diagnosis of obesity and her BMI today is 29.3 Janeli is in the action stage of change   ADVISE: Meztli was educated on the multiple health risks of obesity as well as the benefit of weight loss to improve her health. She was advised of the need for long term treatment and the importance of lifestyle modifications to improve her current health and to decrease her risk of future health problems.  AGREE: Multiple dietary modification options and treatment options were discussed and  Kathleena agreed to follow the recommendations documented in the above note.  ARRANGE: Arabell was educated on the importance of frequent visits to treat obesity as outlined per CMS and USPSTF guidelines and agreed to schedule her next follow up appointment today.  I, Tammy Wysor, am  acting as transcriptionist for Abby Potash, PA-C I, Abby Potash, PA-C have reviewed above note and agree  with its content

## 2018-12-22 ENCOUNTER — Encounter: Payer: Self-pay | Admitting: Internal Medicine

## 2018-12-29 DIAGNOSIS — H04223 Epiphora due to insufficient drainage, bilateral lacrimal glands: Secondary | ICD-10-CM | POA: Diagnosis not present

## 2018-12-29 DIAGNOSIS — H01024 Squamous blepharitis left upper eyelid: Secondary | ICD-10-CM | POA: Diagnosis not present

## 2018-12-29 DIAGNOSIS — H01021 Squamous blepharitis right upper eyelid: Secondary | ICD-10-CM | POA: Diagnosis not present

## 2018-12-29 DIAGNOSIS — H01025 Squamous blepharitis left lower eyelid: Secondary | ICD-10-CM | POA: Diagnosis not present

## 2018-12-29 DIAGNOSIS — H01022 Squamous blepharitis right lower eyelid: Secondary | ICD-10-CM | POA: Diagnosis not present

## 2018-12-29 DIAGNOSIS — H353132 Nonexudative age-related macular degeneration, bilateral, intermediate dry stage: Secondary | ICD-10-CM | POA: Diagnosis not present

## 2018-12-29 DIAGNOSIS — H10413 Chronic giant papillary conjunctivitis, bilateral: Secondary | ICD-10-CM | POA: Diagnosis not present

## 2019-01-05 ENCOUNTER — Ambulatory Visit (INDEPENDENT_AMBULATORY_CARE_PROVIDER_SITE_OTHER): Payer: Medicare HMO | Admitting: Physician Assistant

## 2019-01-05 ENCOUNTER — Encounter (INDEPENDENT_AMBULATORY_CARE_PROVIDER_SITE_OTHER): Payer: Self-pay | Admitting: Physician Assistant

## 2019-01-05 VITALS — BP 103/63 | HR 74 | Temp 97.9°F | Ht 60.0 in | Wt 148.0 lb

## 2019-01-05 DIAGNOSIS — Z6830 Body mass index (BMI) 30.0-30.9, adult: Secondary | ICD-10-CM | POA: Diagnosis not present

## 2019-01-05 DIAGNOSIS — E559 Vitamin D deficiency, unspecified: Secondary | ICD-10-CM

## 2019-01-05 DIAGNOSIS — E669 Obesity, unspecified: Secondary | ICD-10-CM

## 2019-01-05 NOTE — Progress Notes (Signed)
Office: [phone]  /  Fax: [phone]   HPI:   Chief Complaint: OBESITY Kyra is here to discuss her progress with her obesity treatment plan. She is on the Category 2 plan and is following her eating plan approximately 90 % of the time. She states she is taking classes at the gym for 60 minutes 3 times per week. Elsbeth did very well with weight loss. She reports she is writing down her snacks to ensure she is not overeating her snack calories. Her weight is 148 lb (67.1 kg) today and has had a weight loss of 2 pounds over a period of 2 weeks since her last visit. She has lost 10 lbs since starting treatment with [place].  Vitamin D deficiency Philamena has a diagnosis of vitamin D deficiency. She is currently taking prescription Vit D and denies nausea, vomiting or muscle weakness.   ASSESSMENT AND PLAN:  Vitamin D deficiency  Class 1 obesity with serious comorbidity and body mass index (BMI) of 30.0 to 30.9 in adult, unspecified obesity type - BMI greater than 30 at start of program  PLAN:  Vitamin D Deficiency Talita was informed that low vitamin D levels contributes to fatigue and are associated with obesity, breast, and colon cancer. She agrees to continue to take prescription Vit D @50 ,000 IU every week and will follow up for routine testing of vitamin D, at least 2-3 times per year. She was informed of the risk of over-replacement of vitamin D and agrees to not increase her dose unless she discusses this with [place] first. Akiba agrees to follow up with our clinic in 2 weeks.  Obesity Chinwe is currently in the action stage of change. As such, her goal is to continue with weight loss efforts She has agreed to follow the Category 2 plan Suda has been instructed to work up to a goal of 150 minutes of combined cardio and strengthening exercise per week for weight loss and overall health benefits. We discussed the following Behavioral Modification Strategies today: keeping healthy  foods in the home and planning for success  Sarah has agreed to follow up with our clinic in 2 weeks. She was informed of the importance of frequent follow up visits to maximize her success with intensive lifestyle modifications for her multiple health conditions.  ALLERGIES: No Known Allergies  MEDICATIONS: Current Outpatient Medications on File Prior to Visit  Medication Sig Dispense Refill  . colestipol (COLESTID) 1 g tablet Take 2 tablets (2 g total) by mouth 2 (two) times daily. 120 tablet 1  . diphenhydramine-acetaminophen (TYLENOL PM) 25-500 MG TABS tablet Take 1 tablet by mouth at bedtime.    . hydrochlorothiazide (HYDRODIURIL) 25 MG tablet TAKE 1/2 TABLET BY MOUTH EVERY MORNING 45 tablet 1  . lisinopril (PRINIVIL,ZESTRIL) 20 MG tablet take 1 tablet by mouth once daily 90 tablet 1  . Multiple Vitamins-Minerals (ICAPS AREDS 2) CAPS Take 2 capsules by mouth daily.     . rosuvastatin (CRESTOR) 20 MG tablet TAKE 1 TABLET BY MOUTH EVERY DAY 90 tablet 1  . Vitamin D, Ergocalciferol, (DRISDOL) 1.25 MG (50000 UT) CAPS capsule Take 1 capsule (50,000 Units total) by mouth every 7 (seven) days. 4 capsule 0   No current facility-administered medications on file prior to visit.     PAST MEDICAL HISTORY: Past Medical History:  Diagnosis Date  . Adenomatous colon polyp   . Cancer ([place]) 03/2015   BCC R tibia   . Diverticulosis of colon   . GERD (  gastroesophageal reflux disease)   . Hip pain   . Hyperlipidemia   . Hyperplastic colon polyp   . Hypertension   . Lactose intolerance   . Loose stools   . Lower back pain   . Osteoporosis   . Palpitations     PAST SURGICAL HISTORY: Past Surgical History:  Procedure Laterality Date  . EYE SURGERY  2012   B/L 01/2011  02/2011  . HAMMER TOE SURGERY     Bilateral  . ROTATOR CUFF REPAIR     Left  . TUBAL LIGATION      SOCIAL HISTORY: Social History   Tobacco Use  . Smoking status: Former Smoker    Packs/day: 0.50    Years: 30.00     Pack years: 15.00    Types: Cigarettes    Last attempt to quit: 06/02/1991    Years since quitting: 27.6  . Smokeless tobacco: Never Used  Substance Use Topics  . Alcohol use: Yes    Alcohol/week: 5.0 standard drinks    Types: 5 Glasses of wine per week  . Drug use: No    FAMILY HISTORY: Family History  Problem Relation Age of Onset  . Hypertension Mother   . Cancer Mother 12       ?  Marland Kitchen Hyperlipidemia Mother   . Obesity Mother   . Hypertension Father   . Stroke Father   . Heart disease Father   . Hypertension Brother   . Testicular cancer Brother 59  . Hypertension Brother   . Heart disease Brother        quadruple bypass  . Colon cancer Neg Hx   . Stomach cancer Neg Hx   . Throat cancer Neg Hx     ROS: Review of Systems  Constitutional: Positive for weight loss.  Gastrointestinal: Negative for nausea and vomiting.  Musculoskeletal:       Negative for muscle weakness    PHYSICAL EXAM: Blood pressure 103/63, pulse 74, temperature 97.9 F (36.6 C), temperature source Oral, height 5' (1.524 m), weight 148 lb (67.1 kg), SpO2 96 %. Body mass index is 28.9 kg/m. Physical Exam Vitals signs reviewed.  Constitutional:      Appearance: Normal appearance. She is obese.  Cardiovascular:     Rate and Rhythm: Normal rate.     Pulses: Normal pulses.  Pulmonary:     Effort: Pulmonary effort is normal.  Musculoskeletal: Normal range of motion.  Skin:    General: Skin is warm and dry.  Neurological:     Mental Status: She is alert and oriented to person, place, and time.  Psychiatric:        Mood and Affect: Mood normal.        Behavior: Behavior normal.     RECENT LABS AND TESTS: BMET    Component Value Date/Time   NA 143 12/09/2018 1043   K 5.2 12/09/2018 1043   CL 100 12/09/2018 1043   CO2 28 12/09/2018 1043   GLUCOSE 92 12/09/2018 1043   GLUCOSE 83 09/03/2018 1452   BUN 15 12/09/2018 1043   CREATININE 0.76 12/09/2018 1043   CREATININE 0.63 09/03/2018  1452   CALCIUM 10.1 12/09/2018 1043   GFRNONAA 76 12/09/2018 1043   GFRAA 88 12/09/2018 1043   Lab Results  Component Value Date   HGBA1C 5.7 (H) 12/09/2018   HGBA1C 5.8 (H) 09/03/2018   Lab Results  Component Value Date   INSULIN 7.3 12/09/2018   CBC    Component Value Date/Time  WBC 7.9 09/03/2018 1452   RBC 4.97 09/03/2018 1452   HGB 14.5 09/03/2018 1452   HCT 43.8 09/03/2018 1452   PLT 240 09/03/2018 1452   MCV 88.1 09/03/2018 1452   MCH 29.2 09/03/2018 1452   MCHC 33.1 09/03/2018 1452   RDW 13.1 09/03/2018 1452   LYMPHSABS 2,623 09/03/2018 1452   MONOABS 1.0 06/17/2018 1722   EOSABS 498 09/03/2018 1452   BASOSABS 79 09/03/2018 1452   Iron/TIBC/Ferritin/ %Sat No results found for: IRON, TIBC, FERRITIN, IRONPCTSAT Lipid Panel     Component Value Date/Time   CHOL 165 12/09/2018 1043   TRIG 169 (H) 12/09/2018 1043   HDL 41 12/09/2018 1043   CHOLHDL 4.4 09/03/2018 1452   VLDL 32.6 06/17/2018 1722   LDLCALC 90 12/09/2018 1043   LDLCALC 106 (H) 09/03/2018 1452   LDLDIRECT 129.1 08/23/2007 0913   Hepatic Function Panel     Component Value Date/Time   PROT 7.5 12/09/2018 1043   ALBUMIN 4.4 12/09/2018 1043   AST 28 12/09/2018 1043   ALT 33 (H) 12/09/2018 1043   ALKPHOS 103 12/09/2018 1043   BILITOT 0.5 12/09/2018 1043   BILIDIR 0.1 02/19/2015 0847      Component Value Date/Time   TSH 1.75 09/03/2018 1452   TSH 1.95 06/17/2018 1722   TSH 1.71 07/02/2017 1109     Ref. Range 12/09/2018 10:43  Vitamin D, 25-Hydroxy Latest Ref Range: 30.0 - 100.0 ng/mL 43.3     OBESITY BEHAVIORAL INTERVENTION VISIT  Today's visit was # 8   Starting weight: 158 lbs Starting date: 09/06/2018 Today's weight :: 148 lbs Today's date: 01/05/2019 Total lbs lost to date: 10 At least 15 minutes were spent on discussing the following behavioral intervention visit.   ASK: We discussed the diagnosis of obesity with Ilda Mori today and Tashya agreed to give [place] permission to  discuss obesity behavioral modification therapy today.  ASSESS: Simmie has the diagnosis of obesity and her BMI today is 28.9 Chasidy is in the action stage of change   ADVISE: Laniyah was educated on the multiple health risks of obesity as well as the benefit of weight loss to improve her health. She was advised of the need for long term treatment and the importance of lifestyle modifications to improve her current health and to decrease her risk of future health problems.  AGREE: Multiple dietary modification options and treatment options were discussed and  Melisssa agreed to follow the recommendations documented in the above note.  ARRANGE: Lexis was educated on the importance of frequent visits to treat obesity as outlined per CMS and USPSTF guidelines and agreed to schedule her next follow up appointment today.  I, Tammy Wysor, am acting as Location manager for [employer], PA-C I, Abby Potash, PA-C have reviewed above note and agree with its content

## 2019-01-14 ENCOUNTER — Other Ambulatory Visit: Payer: Self-pay | Admitting: Family Medicine

## 2019-01-18 ENCOUNTER — Ambulatory Visit: Payer: Medicare HMO | Admitting: Internal Medicine

## 2019-01-18 ENCOUNTER — Encounter: Payer: Self-pay | Admitting: Internal Medicine

## 2019-01-18 VITALS — BP 120/68 | HR 64 | Ht 60.0 in | Wt 152.4 lb

## 2019-01-18 DIAGNOSIS — R197 Diarrhea, unspecified: Secondary | ICD-10-CM | POA: Diagnosis not present

## 2019-01-18 NOTE — Patient Instructions (Signed)
Please take your Colestipol  Please follow up in 3-6 months

## 2019-01-18 NOTE — Progress Notes (Signed)
HISTORY OF PRESENT ILLNESS:  Cindy Velasquez is a 78 y.o. female, native of [place], who was evaluated here in 2011 for Hemoccult positive stool and more recently November 2019 regarding worsening problems with diarrhea and urgency.  Patient does have a history of adenomatous colon polyps as well.  She underwent colonoscopy December 16, 2018.  Examination revealed diverticulosis.  The exam was otherwise normal.  Random colon biopsies were unremarkable.  She was prescribed Colestid 2 g twice daily and asked to follow-up at this time.  Patient tells me that she decided not to start Colestid.  She wanted to discuss this further.  She does tell me that she changed her diet to a more protein rich diet and has noticed that her symptoms are somewhat less problematic.  No new problems.  REVIEW OF SYSTEMS:  All non-GI ROS negative unless otherwise stated in the HPI except for urinary leakage, back pain, arthritis, allergies  Past Medical History:  Diagnosis Date  . Adenomatous colon polyp   . Cancer ([place]) 03/2015   BCC R tibia   . Diverticulosis of colon   . GERD (gastroesophageal reflux disease)   . Hip pain   . Hyperlipidemia   . Hyperplastic colon polyp   . Hypertension   . Lactose intolerance   . Loose stools   . Lower back pain   . Osteoporosis   . Palpitations     Past Surgical History:  Procedure Laterality Date  . EYE SURGERY  2012   B/L 01/2011  02/2011  . HAMMER TOE SURGERY     Bilateral  . ROTATOR CUFF REPAIR     Left  . TUBAL LIGATION      Social History Cindy Velasquez  reports that she quit smoking about 27 years ago. Her smoking use included cigarettes. She has a 15.00 pack-year smoking history. She has never used smokeless tobacco. She reports current alcohol use of about 5.0 standard drinks of alcohol per week. She reports that she does not use drugs.  family history includes Cancer (age of onset: 76) in her mother; Heart disease in her brother and father;  Hyperlipidemia in her mother; Hypertension in her brother, brother, father, and mother; Obesity in her mother; Stroke in her father; Testicular cancer (age of onset: 31) in her brother.  No Known Allergies     PHYSICAL EXAMINATION: Vital signs: BP 120/68   Pulse 64   Ht 5' (1.524 m)   Wt 152 lb 6 oz (69.1 kg)   BMI 29.76 kg/m   Constitutional: generally well-appearing, no acute distress Psychiatric: alert and oriented x3, cooperative Abdomen: Not reexamined Neuro: No gross deficits  ASSESSMENT:  1.  Chronic intermittent problems with postprandial urgency and loose stools.  Negative colonoscopy with random biopsies.  Question bile salt related diarrhea versus IBS versus bacterial overgrowth 2.  History of adenomatous colon polyps.  No neoplasia most recent exam.  Aged out of surveillance   PLAN:  1.  Patient will initiate Colestid 1 g twice daily.  Titrate as needed.  We discussed effects and side effects of medication. 2.  Routine office follow-up in 3 to 6 months.  If Colestid ineffective then could consider other therapies such as Xifaxan and/or antispasmodics.  15-minute spent face-to-face with the patient.  Greater than 50% the time used for counseling regarding her postprandial urgency and loose stools

## 2019-01-19 ENCOUNTER — Ambulatory Visit (INDEPENDENT_AMBULATORY_CARE_PROVIDER_SITE_OTHER): Payer: Medicare HMO | Admitting: Physician Assistant

## 2019-01-19 ENCOUNTER — Encounter (INDEPENDENT_AMBULATORY_CARE_PROVIDER_SITE_OTHER): Payer: Self-pay

## 2019-01-26 ENCOUNTER — Encounter (INDEPENDENT_AMBULATORY_CARE_PROVIDER_SITE_OTHER): Payer: Self-pay | Admitting: Physician Assistant

## 2019-01-26 ENCOUNTER — Ambulatory Visit (INDEPENDENT_AMBULATORY_CARE_PROVIDER_SITE_OTHER): Payer: Medicare HMO | Admitting: Physician Assistant

## 2019-01-26 VITALS — BP 103/57 | HR 80 | Temp 98.1°F | Ht 60.0 in | Wt 145.0 lb

## 2019-01-26 DIAGNOSIS — E669 Obesity, unspecified: Secondary | ICD-10-CM

## 2019-01-26 DIAGNOSIS — E7849 Other hyperlipidemia: Secondary | ICD-10-CM

## 2019-01-26 DIAGNOSIS — Z6830 Body mass index (BMI) 30.0-30.9, adult: Secondary | ICD-10-CM | POA: Diagnosis not present

## 2019-01-26 NOTE — Progress Notes (Signed)
Office: [phone]  /  Fax: [phone]   HPI:   Chief Complaint: OBESITY Noora is here to discuss her progress with her obesity treatment plan. She is on the Category 2 plan and is following her eating plan approximately 85-90% of the time. She states she is doing yoga, stretching, and doing cardio 60 minutes 3 times per week. Ayjah did very well with weight loss. She reports that she is slightly bored with lunch and would like other options. Her weight is 145 lb (65.8 kg) today and has had a weight loss of 3 pounds over a period of 3 weeks since her last visit. She has lost 13 lbs since starting treatment with [place].  Hyperlipidemia Rosealie has hyperlipidemia and has been trying to improve her cholesterol levels with intensive lifestyle modification including a low saturated fat diet, exercise and weight loss. She denies any chest pain or muscle aches. Bentley is taking Crestor and Colestid.  ASSESSMENT AND PLAN:  Other hyperlipidemia  Class 1 obesity with serious comorbidity and body mass index (BMI) of 30.0 to 30.9 in adult, unspecified obesity type - Starting BMI greater then 30  PLAN:  Hyperlipidemia Martika was informed of the American Heart Association Guidelines emphasizing intensive lifestyle modifications as the first line treatment for hyperlipidemia. We discussed many lifestyle modifications today in depth, and Kamyrah will continue to work on decreasing saturated fats such as fatty red meat, butter and many fried foods. She will also increase vegetables and lean protein in her diet and continue to work on exercise and weight loss efforts.  Obesity Shanessa is currently in the action stage of change. As such, her goal is to continue with weight loss efforts. She has agreed to follow the Category 2 plan. Geniva has been instructed to work up to a goal of 150 minutes of combined cardio and strengthening exercise per week for weight loss and overall health benefits. We  discussed the following Behavioral Modification Strategies today: keeping healthy foods in the home and ways to avoid boredom eating.  Vicke has agreed to follow up with our clinic in 2 weeks. She was informed of the importance of frequent follow up visits to maximize her success with intensive lifestyle modifications for her multiple health conditions.  ALLERGIES: No Known Allergies  MEDICATIONS: Current Outpatient Medications on File Prior to Visit  Medication Sig Dispense Refill  . colestipol (COLESTID) 1 g tablet Take 2 tablets (2 g total) by mouth 2 (two) times daily. 120 tablet 1  . diphenhydramine-acetaminophen (TYLENOL PM) 25-500 MG TABS tablet Take 1 tablet by mouth at bedtime.    . hydrochlorothiazide (HYDRODIURIL) 25 MG tablet TAKE 1/2 TABLET BY MOUTH EVERY MORNING 45 tablet 1  . lisinopril (PRINIVIL,ZESTRIL) 20 MG tablet take 1 tablet by mouth once daily 90 tablet 1  . Multiple Vitamins-Minerals (ICAPS AREDS 2) CAPS Take 2 capsules by mouth daily.     . rosuvastatin (CRESTOR) 20 MG tablet TAKE 1 TABLET BY MOUTH EVERY DAY 90 tablet 1  . Vitamin D, Ergocalciferol, (DRISDOL) 1.25 MG (50000 UT) CAPS capsule Take 1 capsule (50,000 Units total) by mouth every 7 (seven) days. 4 capsule 0   No current facility-administered medications on file prior to visit.     PAST MEDICAL HISTORY: Past Medical History:  Diagnosis Date  . Adenomatous colon polyp   . Cancer ([place]) 03/2015   BCC R tibia   . Diverticulosis of colon   . GERD (gastroesophageal reflux disease)   . Hip pain   .  Hyperlipidemia   . Hyperplastic colon polyp   . Hypertension   . Lactose intolerance   . Loose stools   . Lower back pain   . Osteoporosis   . Palpitations     PAST SURGICAL HISTORY: Past Surgical History:  Procedure Laterality Date  . EYE SURGERY  2012   B/L 01/2011  02/2011  . HAMMER TOE SURGERY     Bilateral  . ROTATOR CUFF REPAIR     Left  . TUBAL LIGATION      SOCIAL HISTORY: Social  History   Tobacco Use  . Smoking status: Former Smoker    Packs/day: 0.50    Years: 30.00    Pack years: 15.00    Types: Cigarettes    Last attempt to quit: 06/02/1991    Years since quitting: 27.6  . Smokeless tobacco: Never Used  Substance Use Topics  . Alcohol use: Yes    Alcohol/week: 5.0 standard drinks    Types: 5 Glasses of wine per week  . Drug use: No    FAMILY HISTORY: Family History  Problem Relation Age of Onset  . Hypertension Mother   . Cancer Mother 31       ?  Marland Kitchen Hyperlipidemia Mother   . Obesity Mother   . Hypertension Father   . Stroke Father   . Heart disease Father   . Hypertension Brother   . Testicular cancer Brother 35  . Hypertension Brother   . Heart disease Brother        quadruple bypass  . Colon cancer Neg Hx   . Stomach cancer Neg Hx   . Throat cancer Neg Hx     ROS: Review of Systems  Constitutional: Positive for weight loss.  Cardiovascular: Negative for chest pain.  Musculoskeletal:       Negative for muscle aches.   PHYSICAL EXAM: Blood pressure (!) 103/57, pulse 80, temperature 98.1 F (36.7 C), height 5' (1.524 m), weight 145 lb (65.8 kg), SpO2 97 %. Body mass index is 28.32 kg/m. Physical Exam Vitals signs reviewed.  Constitutional:      Appearance: Normal appearance. She is obese.  Cardiovascular:     Rate and Rhythm: Normal rate.     Pulses: Normal pulses.  Pulmonary:     Effort: Pulmonary effort is normal.     Breath sounds: Normal breath sounds.  Musculoskeletal: Normal range of motion.  Skin:    General: Skin is warm and dry.  Neurological:     Mental Status: She is alert and oriented to person, place, and time.  Psychiatric:        Behavior: Behavior normal.   RECENT LABS AND TESTS: BMET    Component Value Date/Time   NA 143 12/09/2018 1043   K 5.2 12/09/2018 1043   CL 100 12/09/2018 1043   CO2 28 12/09/2018 1043   GLUCOSE 92 12/09/2018 1043   GLUCOSE 83 09/03/2018 1452   BUN 15 12/09/2018 1043    CREATININE 0.76 12/09/2018 1043   CREATININE 0.63 09/03/2018 1452   CALCIUM 10.1 12/09/2018 1043   GFRNONAA 76 12/09/2018 1043   GFRAA 88 12/09/2018 1043   Lab Results  Component Value Date   HGBA1C 5.7 (H) 12/09/2018   HGBA1C 5.8 (H) 09/03/2018   Lab Results  Component Value Date   INSULIN 7.3 12/09/2018   CBC    Component Value Date/Time   WBC 7.9 09/03/2018 1452   RBC 4.97 09/03/2018 1452   HGB 14.5 09/03/2018 1452  HCT 43.8 09/03/2018 1452   PLT 240 09/03/2018 1452   MCV 88.1 09/03/2018 1452   MCH 29.2 09/03/2018 1452   MCHC 33.1 09/03/2018 1452   RDW 13.1 09/03/2018 1452   LYMPHSABS 2,623 09/03/2018 1452   MONOABS 1.0 06/17/2018 1722   EOSABS 498 09/03/2018 1452   BASOSABS 79 09/03/2018 1452   Iron/TIBC/Ferritin/ %Sat No results found for: IRON, TIBC, FERRITIN, IRONPCTSAT Lipid Panel     Component Value Date/Time   CHOL 165 12/09/2018 1043   TRIG 169 (H) 12/09/2018 1043   HDL 41 12/09/2018 1043   CHOLHDL 4.4 09/03/2018 1452   VLDL 32.6 06/17/2018 1722   LDLCALC 90 12/09/2018 1043   LDLCALC 106 (H) 09/03/2018 1452   LDLDIRECT 129.1 08/23/2007 0913   Hepatic Function Panel     Component Value Date/Time   PROT 7.5 12/09/2018 1043   ALBUMIN 4.4 12/09/2018 1043   AST 28 12/09/2018 1043   ALT 33 (H) 12/09/2018 1043   ALKPHOS 103 12/09/2018 1043   BILITOT 0.5 12/09/2018 1043   BILIDIR 0.1 02/19/2015 0847      Component Value Date/Time   TSH 1.75 09/03/2018 1452   TSH 1.95 06/17/2018 1722   TSH 1.71 07/02/2017 1109    Ref. Range 12/09/2018 10:43  Vitamin D, 25-Hydroxy Latest Ref Range: 30.0 - 100.0 ng/mL 43.3   OBESITY BEHAVIORAL INTERVENTION VISIT  Today's visit was #9   Starting weight: 158 lbs Starting date: 09/06/2018 Today's weight: 145 lbs  Today's date: 01/26/2019 Total lbs lost to date: 13 At least 15 minutes were spent on discussing the following behavioral intervention visit.  ASK: We discussed the diagnosis of obesity with Ilda Mori today and Avigayil agreed to give [place] permission to discuss obesity behavioral modification therapy today.  ASSESS: Alphia has the diagnosis of obesity and her BMI today is 28.32. Zeppelin is in the action stage of change.   ADVISE: Leonda was educated on the multiple health risks of obesity as well as the benefit of weight loss to improve her health. She was advised of the need for long term treatment and the importance of lifestyle modifications to improve her current health and to decrease her risk of future health problems.  AGREE: Multiple dietary modification options and treatment options were discussed and  Joylyn agreed to follow the recommendations documented in the above note.  ARRANGE: Breona was educated on the importance of frequent visits to treat obesity as outlined per CMS and USPSTF guidelines and agreed to schedule her next follow up appointment today.  Migdalia Dk, am acting as transcriptionist for Abby Potash, PA-C I, Abby Potash, PA-C have reviewed above note and agree with its content

## 2019-02-02 ENCOUNTER — Other Ambulatory Visit: Payer: Self-pay | Admitting: Family Medicine

## 2019-02-07 ENCOUNTER — Ambulatory Visit: Payer: Self-pay

## 2019-02-07 NOTE — Telephone Encounter (Signed)
Pt. Reports she has had a history of diarrhea and went to Dr. Henrene Pastor for this. He started her on Colestid - told her she could take up to 4 tablets a day. Only went to 2 per day. Read that this medication could raise her triglycerides and finds this very concerning. The medication has not helped her diarrhea yet, but she has not titrated up to 4/day. Wants to know what Dr. Carollee Herter thinks she should do. Contact number [phone].  Answer Assessment - Initial Assessment Questions 1. SYMPTOMS: "Do you have any symptoms?"      No - other than still having diarrhea 2. SEVERITY: If symptoms are present, ask "Are they mild, moderate or severe?"     Diarrhea stools vary 6-8  Protocols used: MEDICATION QUESTION CALL-A-AH

## 2019-02-09 ENCOUNTER — Ambulatory Visit (INDEPENDENT_AMBULATORY_CARE_PROVIDER_SITE_OTHER): Payer: Medicare HMO | Admitting: Physician Assistant

## 2019-02-09 VITALS — BP 109/64 | HR 77 | Temp 97.9°F | Ht 60.0 in | Wt 144.0 lb

## 2019-02-09 DIAGNOSIS — Z6830 Body mass index (BMI) 30.0-30.9, adult: Secondary | ICD-10-CM | POA: Diagnosis not present

## 2019-02-09 DIAGNOSIS — E559 Vitamin D deficiency, unspecified: Secondary | ICD-10-CM | POA: Diagnosis not present

## 2019-02-09 DIAGNOSIS — E669 Obesity, unspecified: Secondary | ICD-10-CM

## 2019-02-09 NOTE — Telephone Encounter (Signed)
She should try to inc the colestid to control the diarrhea per GI ---- if it does not work call them We can recheck her lipids in end of march beg April to see if it is affecting her TG

## 2019-02-10 MED ORDER — VITAMIN D (ERGOCALCIFEROL) 1.25 MG (50000 UNIT) PO CAPS: 50000.0000 [IU] | ORAL_CAPSULE | ORAL | 0 refills | Status: DC

## 2019-02-10 NOTE — Progress Notes (Signed)
Office: [phone]  /  Fax: [phone]   HPI:   Chief Complaint: OBESITY Cindy Velasquez is here to discuss her progress with her obesity treatment plan. She is on the Category 2 plan and is following her eating plan approximately 50% of the time. She states she is doing cardio, chair yoga, strength and balance 60 minutes 3 times per week. Cindy Velasquez did well with weight loss. She continues to have diarrhea which she is seeing her GI for. She denies excessive hunger. Her weight is 144 lb (65.3 kg) today and has had Cindy weight loss of 1 pound over Cindy period of 2 weeks since her last visit. She has lost 14 lbs since starting treatment with [place].  Vitamin D deficiency Cindy Velasquez has Cindy diagnosis of Vitamin D deficiency. She is currently taking Vit D and denies nausea, vomiting or muscle weakness.  ASSESSMENT AND PLAN:  Vitamin D deficiency - Plan: Vitamin D, Ergocalciferol, (DRISDOL) 1.25 MG (50000 UT) CAPS capsule  Class 1 obesity with serious comorbidity and body mass index (BMI) of 30.0 to 30.9 in adult, unspecified obesity type - Starting BMI greater then 30  PLAN:  Vitamin D Deficiency Cindy Velasquez was informed that low Vitamin D levels contributes to fatigue and are associated with obesity, breast, and colon cancer. She agrees to continue to take Vit D and will follow-up for routine testing of Vitamin D, at least 2-3 times per year. She was informed of the risk of over-replacement of Vitamin D and agrees to not increase her dose unless she discusses this with [place] first. Cindy Velasquez agrees to follow-up with our clinic in 2 weeks.  Obesity Cindy Velasquez is currently in the action stage of change. As such, her goal is to continue with weight loss efforts She has agreed to follow the Category 2 plan Cindy Velasquez has been instructed to work up to Cindy goal of 150 minutes of combined cardio and strengthening exercise per week for weight loss and overall health benefits. We discussed the following Behavioral Modification Stratagies  today: work on meal planning and easy cooking plans and keeping healthy foods in the home.  Cindy Velasquez has agreed to follow-up with our clinic in 2 weeks. She was informed of the importance of frequent follow up visits to maximize her success with intensive lifestyle modifications for her multiple health conditions.  ALLERGIES: No Known Allergies  MEDICATIONS: Current Outpatient Medications on File Prior to Visit  Medication Sig Dispense Refill  . colestipol (COLESTID) 1 g tablet Take 2 tablets (2 g total) by mouth 2 (two) times daily. 120 tablet 1  . diphenhydramine-acetaminophen (TYLENOL PM) 25-500 MG TABS tablet Take 1 tablet by mouth at bedtime.    . hydrochlorothiazide (HYDRODIURIL) 25 MG tablet TAKE 1/2 TABLET BY MOUTH EVERY MORNING 45 tablet 1  . lisinopril (PRINIVIL,ZESTRIL) 20 MG tablet take 1 tablet by mouth once daily 90 tablet 1  . Multiple Vitamins-Minerals (ICAPS AREDS 2) CAPS Take 2 capsules by mouth daily.     . rosuvastatin (CRESTOR) 20 MG tablet TAKE 1 TABLET BY MOUTH EVERY DAY 90 tablet 1  . Vitamin D, Ergocalciferol, (DRISDOL) 1.25 MG (50000 UT) CAPS capsule Take 1 capsule (50,000 Units total) by mouth every 7 (seven) days. 4 capsule 0   No current facility-administered medications on file prior to visit.     PAST MEDICAL HISTORY: Past Medical History:  Diagnosis Date  . Adenomatous colon polyp   . Cancer ([place]) 03/2015   BCC R tibia   . Diverticulosis of colon   .  GERD (gastroesophageal reflux disease)   . Hip pain   . Hyperlipidemia   . Hyperplastic colon polyp   . Hypertension   . Lactose intolerance   . Loose stools   . Lower back pain   . Osteoporosis   . Palpitations     PAST SURGICAL HISTORY: Past Surgical History:  Procedure Laterality Date  . EYE SURGERY  2012   B/L 01/2011  02/2011  . HAMMER TOE SURGERY     Bilateral  . ROTATOR CUFF REPAIR     Left  . TUBAL LIGATION      SOCIAL HISTORY: Social History   Tobacco Use  . Smoking status:  Former Smoker    Packs/day: 0.50    Years: 30.00    Pack years: 15.00    Types: Cigarettes    Last attempt to quit: 06/02/1991    Years since quitting: 27.7  . Smokeless tobacco: Never Used  Substance Use Topics  . Alcohol use: Yes    Alcohol/week: 5.0 standard drinks    Types: 5 Glasses of wine per week  . Drug use: No    FAMILY HISTORY: Family History  Problem Relation Age of Onset  . Hypertension Mother   . Cancer Mother 60       ?  Marland Kitchen Hyperlipidemia Mother   . Obesity Mother   . Hypertension Father   . Stroke Father   . Heart disease Father   . Hypertension Brother   . Testicular cancer Brother 58  . Hypertension Brother   . Heart disease Brother        quadruple bypass  . Colon cancer Neg Hx   . Stomach cancer Neg Hx   . Throat cancer Neg Hx    ROS: Review of Systems  Constitutional: Positive for weight loss.  Gastrointestinal: Positive for diarrhea. Negative for nausea and vomiting.  Musculoskeletal:       Negative for muscle weakness.  Endo/Heme/Allergies:       Negative for hypoglycemia.   PHYSICAL EXAM: Blood pressure 109/64, pulse 77, temperature 97.9 F (36.6 C), temperature source Oral, height 5' (1.524 m), weight 144 lb (65.3 kg), SpO2 96 %. Body mass index is 28.12 kg/m. Physical Exam Vitals signs reviewed.  Constitutional:      Appearance: Normal appearance. She is obese.  Cardiovascular:     Rate and Rhythm: Normal rate.     Pulses: Normal pulses.  Pulmonary:     Effort: Pulmonary effort is normal.     Breath sounds: Normal breath sounds.  Musculoskeletal: Normal range of motion.  Skin:    General: Skin is warm and dry.  Neurological:     Mental Status: She is alert and oriented to person, place, and time.  Psychiatric:        Behavior: Behavior normal.   RECENT LABS AND TESTS: BMET    Component Value Date/Time   NA 143 12/09/2018 1043   K 5.2 12/09/2018 1043   CL 100 12/09/2018 1043   CO2 28 12/09/2018 1043   GLUCOSE 92  12/09/2018 1043   GLUCOSE 83 09/03/2018 1452   BUN 15 12/09/2018 1043   CREATININE 0.76 12/09/2018 1043   CREATININE 0.63 09/03/2018 1452   CALCIUM 10.1 12/09/2018 1043   GFRNONAA 76 12/09/2018 1043   GFRAA 88 12/09/2018 1043   Lab Results  Component Value Date   HGBA1C 5.7 (H) 12/09/2018   HGBA1C 5.8 (H) 09/03/2018   Lab Results  Component Value Date   INSULIN 7.3 12/09/2018  CBC    Component Value Date/Time   WBC 7.9 09/03/2018 1452   RBC 4.97 09/03/2018 1452   HGB 14.5 09/03/2018 1452   HCT 43.8 09/03/2018 1452   PLT 240 09/03/2018 1452   MCV 88.1 09/03/2018 1452   MCH 29.2 09/03/2018 1452   MCHC 33.1 09/03/2018 1452   RDW 13.1 09/03/2018 1452   LYMPHSABS 2,623 09/03/2018 1452   MONOABS 1.0 06/17/2018 1722   EOSABS 498 09/03/2018 1452   BASOSABS 79 09/03/2018 1452   Iron/TIBC/Ferritin/ %Sat No results found for: IRON, TIBC, FERRITIN, IRONPCTSAT Lipid Panel     Component Value Date/Time   CHOL 165 12/09/2018 1043   TRIG 169 (H) 12/09/2018 1043   HDL 41 12/09/2018 1043   CHOLHDL 4.4 09/03/2018 1452   VLDL 32.6 06/17/2018 1722   LDLCALC 90 12/09/2018 1043   LDLCALC 106 (H) 09/03/2018 1452   LDLDIRECT 129.1 08/23/2007 0913   Hepatic Function Panel     Component Value Date/Time   PROT 7.5 12/09/2018 1043   ALBUMIN 4.4 12/09/2018 1043   AST 28 12/09/2018 1043   ALT 33 (H) 12/09/2018 1043   ALKPHOS 103 12/09/2018 1043   BILITOT 0.5 12/09/2018 1043   BILIDIR 0.1 02/19/2015 0847      Component Value Date/Time   TSH 1.75 09/03/2018 1452   TSH 1.95 06/17/2018 1722   TSH 1.71 07/02/2017 1109    Ref. Range 12/09/2018 10:43  Vitamin D, 25-Hydroxy Latest Ref Range: 30.0 - 100.0 ng/mL 43.3   OBESITY BEHAVIORAL INTERVENTION VISIT  Today's visit was #10  Starting weight: 158 lbs Starting date: 09/06/2018 Today's weight: 144 lbs  Today's date: 02/09/2019 Total lbs lost to date: 14 At least 15 minutes were spent on discussing the following behavioral  intervention visit.    02/09/2019  Height 5' (1.524 m)  Weight 144 lb (65.3 kg)  BMI (Calculated) 28.12  BLOOD PRESSURE - SYSTOLIC 709  BLOOD PRESSURE - DIASTOLIC 64   Body Fat % 40 %  Total Body Water (lbs) 58.2 lbs   ASK: We discussed the diagnosis of obesity with Cindy Velasquez today and Cindy Velasquez agreed to give [place] permission to discuss obesity behavioral modification therapy today.  ASSESS: Cindy Velasquez has the diagnosis of obesity and her BMI today is 28.12. Cindy Velasquez is in the action stage of change.  ADVISE: Cindy Velasquez was educated on the multiple health risks of obesity as well as the benefit of weight loss to improve her health. She was advised of the need for long term treatment and the importance of lifestyle modifications to improve her current health and to decrease her risk of future health problems.  AGREE: Multiple dietary modification options and treatment options were discussed and  Cindy Velasquez agreed to follow the recommendations documented in the above note.  ARRANGE: Cindy Velasquez was educated on the importance of frequent visits to treat obesity as outlined per CMS and USPSTF guidelines and agreed to schedule her next follow up appointment today.  Migdalia Dk, am acting as transcriptionist for Abby Potash, PA-C I, Abby Potash, PA-C have reviewed above note and agree with its content

## 2019-02-10 NOTE — Telephone Encounter (Signed)
Patient notified

## 2019-02-19 ENCOUNTER — Other Ambulatory Visit: Payer: Self-pay | Admitting: Family Medicine

## 2019-02-19 DIAGNOSIS — I10 Essential (primary) hypertension: Secondary | ICD-10-CM

## 2019-03-01 ENCOUNTER — Ambulatory Visit (INDEPENDENT_AMBULATORY_CARE_PROVIDER_SITE_OTHER): Payer: Medicare HMO | Admitting: Physician Assistant

## 2019-03-02 ENCOUNTER — Ambulatory Visit (INDEPENDENT_AMBULATORY_CARE_PROVIDER_SITE_OTHER): Payer: Medicare HMO | Admitting: Physician Assistant

## 2019-03-02 ENCOUNTER — Other Ambulatory Visit: Payer: Self-pay

## 2019-03-02 ENCOUNTER — Encounter (INDEPENDENT_AMBULATORY_CARE_PROVIDER_SITE_OTHER): Payer: Self-pay | Admitting: Physician Assistant

## 2019-03-02 DIAGNOSIS — E559 Vitamin D deficiency, unspecified: Secondary | ICD-10-CM | POA: Diagnosis not present

## 2019-03-02 DIAGNOSIS — E669 Obesity, unspecified: Secondary | ICD-10-CM | POA: Diagnosis not present

## 2019-03-02 DIAGNOSIS — Z6830 Body mass index (BMI) 30.0-30.9, adult: Secondary | ICD-10-CM | POA: Diagnosis not present

## 2019-03-02 MED ORDER — VITAMIN D (ERGOCALCIFEROL) 1.25 MG (50000 UNIT) PO CAPS: 50000.0000 [IU] | ORAL_CAPSULE | ORAL | 0 refills | Status: DC

## 2019-03-02 NOTE — Progress Notes (Addendum)
Office: [phone]  /  Fax: [phone] TeleHealth Visit:  Cindy Velasquez has consented to this TeleHealth visit today via telephone call. The patient is located at home, the provider is located at the [facility] office. The participants in this visit include the listed provider and patient and provider's assistant.   HPI:   Chief Complaint: OBESITY Cindy Velasquez is here to discuss her progress with her obesity treatment plan. She is on the Category 2 plan and is following her eating plan approximately 50 % of the time. She states she is doing yoga and cardio for 30 minutes 3 times per week. Gabriela reports that she has not been eating all of her food on the plan due to continued diarrhea. She is not getting enough fiber in her diet.  We were unable to weight the patient today for this TeleHealth visit. She feels as if she has gained 1 lbs since her last visit. She has lost 14 lbs since starting treatment with [place].  Vitamin D Deficiency Cindy Velasquez has a diagnosis of vitamin D deficiency. She is currently taking prescription Vit D and denies nausea, vomiting or muscle weakness.  ASSESSMENT AND PLAN:  Vitamin D deficiency - Plan: Vitamin D, Ergocalciferol, (DRISDOL) 1.25 MG (50000 UT) CAPS capsule  Class 1 obesity with serious comorbidity and body mass index (BMI) of 30.0 to 30.9 in adult, unspecified obesity type - Starting BMI greater then 30  PLAN:  Vitamin D Deficiency Cindy Velasquez was informed that low vitamin D levels contributes to fatigue and are associated with obesity, breast, and colon cancer. Cindy Velasquez agrees to continue taking prescription Vit D @50 ,000 IU every week #4 and we will refill for 1 month. She will follow up for routine testing of vitamin D, at least 2-3 times per year. She was informed of the risk of over-replacement of vitamin D and agrees to not increase her dose unless she discusses this with [place] first. Cindy Velasquez agrees to follow up with our clinic in 2 weeks.  I spent  > than 50% of the 25 minute visit on counseling as documented in the note.  Obesity Cindy Velasquez is currently in the action stage of change. As such, her goal is to continue with weight loss efforts She has agreed to follow the Category 2 plan Cindy Velasquez has been instructed to work up to a goal of 150 minutes of combined cardio and strengthening exercise per week for weight loss and overall health benefits. We discussed the following Behavioral Modification Strategies today: increasing lean protein intake, decreasing simple carbohydrates  and work on meal planning and easy cooking plans  Cindy Velasquez has agreed to follow up with our clinic in 2 weeks. She was informed of the importance of frequent follow up visits to maximize her success with intensive lifestyle modifications for her multiple health conditions.  I spent > than 50% of the 25 minute visit on counseling as documented in the note.   ALLERGIES: No Known Allergies  MEDICATIONS: Current Outpatient Medications on File Prior to Visit  Medication Sig Dispense Refill  . colestipol (COLESTID) 1 g tablet Take 2 tablets (2 g total) by mouth 2 (two) times daily. 120 tablet 1  . diphenhydramine-acetaminophen (TYLENOL PM) 25-500 MG TABS tablet Take 1 tablet by mouth at bedtime.    . hydrochlorothiazide (HYDRODIURIL) 25 MG tablet TAKE 1/2 TABLET BY MOUTH EVERY MORNING 45 tablet 1  . lisinopril (PRINIVIL,ZESTRIL) 20 MG tablet Take 1 tablet (20 mg total) by mouth daily. 90 tablet 0  .  Multiple Vitamins-Minerals (ICAPS AREDS 2) CAPS Take 2 capsules by mouth daily.     . rosuvastatin (CRESTOR) 20 MG tablet TAKE 1 TABLET BY MOUTH EVERY DAY 90 tablet 1   No current facility-administered medications on file prior to visit.     PAST MEDICAL HISTORY: Past Medical History:  Diagnosis Date  . Adenomatous colon polyp   . Cancer ([place]) 03/2015   BCC R tibia   . Diverticulosis of colon   . GERD (gastroesophageal reflux disease)   . Hip pain   . Hyperlipidemia    . Hyperplastic colon polyp   . Hypertension   . Lactose intolerance   . Loose stools   . Lower back pain   . Osteoporosis   . Palpitations     PAST SURGICAL HISTORY: Past Surgical History:  Procedure Laterality Date  . EYE SURGERY  2012   B/L 01/2011  02/2011  . HAMMER TOE SURGERY     Bilateral  . ROTATOR CUFF REPAIR     Left  . TUBAL LIGATION      SOCIAL HISTORY: Social History   Tobacco Use  . Smoking status: Former Smoker    Packs/day: 0.50    Years: 30.00    Pack years: 15.00    Types: Cigarettes    Last attempt to quit: 06/02/1991    Years since quitting: 27.7  . Smokeless tobacco: Never Used  Substance Use Topics  . Alcohol use: Yes    Alcohol/week: 5.0 standard drinks    Types: 5 Glasses of wine per week  . Drug use: No    FAMILY HISTORY: Family History  Problem Relation Age of Onset  . Hypertension Mother   . Cancer Mother 44       ?  Marland Kitchen Hyperlipidemia Mother   . Obesity Mother   . Hypertension Father   . Stroke Father   . Heart disease Father   . Hypertension Brother   . Testicular cancer Brother 31  . Hypertension Brother   . Heart disease Brother        quadruple bypass  . Colon cancer Neg Hx   . Stomach cancer Neg Hx   . Throat cancer Neg Hx     ROS: Review of Systems  Constitutional: Negative for weight loss.  Gastrointestinal: Negative for diarrhea, nausea and vomiting.  Musculoskeletal:       Negative muscle weakness    PHYSICAL EXAM: Pt in no acute distress  RECENT LABS AND TESTS: BMET    Component Value Date/Time   NA 143 12/09/2018 1043   K 5.2 12/09/2018 1043   CL 100 12/09/2018 1043   CO2 28 12/09/2018 1043   GLUCOSE 92 12/09/2018 1043   GLUCOSE 83 09/03/2018 1452   BUN 15 12/09/2018 1043   CREATININE 0.76 12/09/2018 1043   CREATININE 0.63 09/03/2018 1452   CALCIUM 10.1 12/09/2018 1043   GFRNONAA 76 12/09/2018 1043   GFRAA 88 12/09/2018 1043   Lab Results  Component Value Date   HGBA1C 5.7 (H) 12/09/2018    HGBA1C 5.8 (H) 09/03/2018   Lab Results  Component Value Date   INSULIN 7.3 12/09/2018   CBC    Component Value Date/Time   WBC 7.9 09/03/2018 1452   RBC 4.97 09/03/2018 1452   HGB 14.5 09/03/2018 1452   HCT 43.8 09/03/2018 1452   PLT 240 09/03/2018 1452   MCV 88.1 09/03/2018 1452   MCH 29.2 09/03/2018 1452   MCHC 33.1 09/03/2018 1452   RDW 13.1 09/03/2018  [place] 09/03/2018 1452   MONOABS 1.0 06/17/2018 1722   EOSABS 498 09/03/2018 1452   BASOSABS 79 09/03/2018 1452   Iron/TIBC/Ferritin/ %Sat No results found for: IRON, TIBC, FERRITIN, IRONPCTSAT Lipid Panel     Component Value Date/Time   CHOL 165 12/09/2018 1043   TRIG 169 (H) 12/09/2018 1043   HDL 41 12/09/2018 1043   CHOLHDL 4.4 09/03/2018 1452   VLDL 32.6 06/17/2018 1722   LDLCALC 90 12/09/2018 1043   LDLCALC 106 (H) 09/03/2018 1452   LDLDIRECT 129.1 08/23/2007 0913   Hepatic Function Panel     Component Value Date/Time   PROT 7.5 12/09/2018 1043   ALBUMIN 4.4 12/09/2018 1043   AST 28 12/09/2018 1043   ALT 33 (H) 12/09/2018 1043   ALKPHOS 103 12/09/2018 1043   BILITOT 0.5 12/09/2018 1043   BILIDIR 0.1 02/19/2015 0847      Component Value Date/Time   TSH 1.75 09/03/2018 1452   TSH 1.95 06/17/2018 1722   TSH 1.71 07/02/2017 1109      I, Trixie Dredge, am acting as transcriptionist for Abby Potash, PA-C I, Abby Potash, PA-C have reviewed above note and agree with its content

## 2019-03-03 ENCOUNTER — Encounter (INDEPENDENT_AMBULATORY_CARE_PROVIDER_SITE_OTHER): Payer: Self-pay

## 2019-03-05 ENCOUNTER — Telehealth: Payer: Self-pay | Admitting: Family Medicine

## 2019-03-05 NOTE — Telephone Encounter (Signed)
Pt called and l/m she has as virtual visit and wants and like call back on how to set up the app for her appt.

## 2019-03-07 ENCOUNTER — Other Ambulatory Visit: Payer: Self-pay

## 2019-03-07 ENCOUNTER — Telehealth: Payer: Self-pay | Admitting: *Deleted

## 2019-03-07 ENCOUNTER — Other Ambulatory Visit (INDEPENDENT_AMBULATORY_CARE_PROVIDER_SITE_OTHER): Payer: Medicare HMO

## 2019-03-07 ENCOUNTER — Ambulatory Visit: Payer: Medicare HMO | Admitting: Family Medicine

## 2019-03-07 DIAGNOSIS — E785 Hyperlipidemia, unspecified: Secondary | ICD-10-CM

## 2019-03-07 LAB — CBC WITH DIFFERENTIAL/PLATELET
Basophils Absolute: 0.1 10*3/uL (ref 0.0–0.1)
Basophils Relative: 1.5 % (ref 0.0–3.0)
Eosinophils Absolute: 0.3 10*3/uL (ref 0.0–0.7)
Eosinophils Relative: 5.1 % — ABNORMAL HIGH (ref 0.0–5.0)
HCT: 40.6 % (ref 36.0–46.0)
Hemoglobin: 13.6 g/dL (ref 12.0–15.0)
LYMPHS ABS: 2.1 10*3/uL (ref 0.7–4.0)
Lymphocytes Relative: 32.4 % (ref 12.0–46.0)
MCHC: 33.6 g/dL (ref 30.0–36.0)
MCV: 89.9 fl (ref 78.0–100.0)
MONO ABS: 0.7 10*3/uL (ref 0.1–1.0)
Monocytes Relative: 11.2 % (ref 3.0–12.0)
NEUTROS PCT: 49.8 % (ref 43.0–77.0)
Neutro Abs: 3.2 10*3/uL (ref 1.4–7.7)
Platelets: 270 10*3/uL (ref 150.0–400.0)
RBC: 4.51 Mil/uL (ref 3.87–5.11)
RDW: 14.7 % (ref 11.5–15.5)
WBC: 6.5 10*3/uL (ref 4.0–10.5)

## 2019-03-07 LAB — COMPREHENSIVE METABOLIC PANEL
ALT: 23 U/L (ref 0–35)
AST: 22 U/L (ref 0–37)
Albumin: 3.8 g/dL (ref 3.5–5.2)
Alkaline Phosphatase: 84 U/L (ref 39–117)
BUN: 20 mg/dL (ref 6–23)
CO2: 33 mEq/L — ABNORMAL HIGH (ref 19–32)
Calcium: 9.3 mg/dL (ref 8.4–10.5)
Chloride: 102 mEq/L (ref 96–112)
Creatinine, Ser: 0.63 mg/dL (ref 0.40–1.20)
GFR: 91.48 mL/min (ref >60.00)
GLUCOSE: 88 mg/dL (ref 70–99)
Potassium: 3.5 mEq/L (ref 3.5–5.1)
Sodium: 143 mEq/L (ref 135–145)
Total Bilirubin: 0.5 mg/dL (ref 0.2–1.2)
Total Protein: 6.7 g/dL (ref 6.0–8.3)

## 2019-03-07 LAB — LIPID PANEL
CHOLESTEROL: 141 mg/dL (ref 0–200)
HDL: 38.3 mg/dL — ABNORMAL LOW (ref >39.00)
LDL Cholesterol: 76 mg/dL (ref 0–99)
NonHDL: 102.73
Total CHOL/HDL Ratio: 4
Triglycerides: 133 mg/dL (ref 0.0–149.0)
VLDL: 26.6 mg/dL (ref 0.0–40.0)

## 2019-03-07 NOTE — Telephone Encounter (Signed)
Patient wants a b12 added to her labs for today.  Are you ok with that?  If yes please give dx code.

## 2019-03-07 NOTE — Telephone Encounter (Signed)
Patient set up for appt for tomorrow.

## 2019-03-07 NOTE — Telephone Encounter (Signed)
We need a reason-- b12 def is not on her problem list  If she wants it done for fatigue you can use that

## 2019-03-08 ENCOUNTER — Ambulatory Visit (INDEPENDENT_AMBULATORY_CARE_PROVIDER_SITE_OTHER): Payer: Medicare HMO | Admitting: Family Medicine

## 2019-03-08 ENCOUNTER — Encounter: Payer: Self-pay | Admitting: Family Medicine

## 2019-03-08 DIAGNOSIS — E663 Overweight: Secondary | ICD-10-CM

## 2019-03-08 DIAGNOSIS — E785 Hyperlipidemia, unspecified: Secondary | ICD-10-CM | POA: Diagnosis not present

## 2019-03-08 DIAGNOSIS — I10 Essential (primary) hypertension: Secondary | ICD-10-CM

## 2019-03-08 NOTE — Assessment & Plan Note (Signed)
Going to healthy weight and wellness

## 2019-03-08 NOTE — Progress Notes (Signed)
Virtual Visit via Video Note  I connected with Cindy Velasquez on 03/08/19 at  9:30 AM EDT by a video enabled telemedicine application and verified that I am speaking with the correct person using two identifiers.   I discussed the limitations of evaluation and management by telemedicine and the availability of in person appointments. The patient expressed understanding and agreed to proceed.  History of Present Illness: Pt is home--- needs refills of meds . F/u bp and cholesterol Labs done yesterday and reviewed    Observations/Objective: 109/64 Wt 145 Ht 5 ft Pt in no distress Assessment and Plan: 1. Hyperlipidemia LDL goal <100 Encouraged heart healthy diet, increase exercise, avoid trans fats, consider a krill oil cap daily   2. Essential hypertension Well controlled, no changes to meds. Encouraged heart healthy diet such as the DASH diet and exercise as tolerated.   3. Hyperlipidemia, unspecified hyperlipidemia type Tolerating statin, encouraged heart healthy diet, avoid trans fats, minimize simple carbs and saturated fats. Increase exercise as tolerated  4. Overweight (BMI 25.0-29.9) con't healthy weight and wellness    Follow Up Instructions:    I discussed the assessment and treatment plan with the patient. The patient was provided an opportunity to ask questions and all were answered. The patient agreed with the plan and demonstrated an understanding of the instructions.   The patient was advised to call back or seek an in-person evaluation if the symptoms worsen or if the condition fails to improve as anticipated.  I provided 25 minutes of non-face-to-face time during this encounter with>50% discussing meds, labs and healthy weight and wellness   Ann Held, DO

## 2019-03-08 NOTE — Assessment & Plan Note (Addendum)
Well controlled, no changes to meds. Encouraged heart healthy diet such as the DASH diet and exercise as tolerated.    Chemistry      Component Value Date/Time   NA 143 03/07/2019 0857   NA 143 12/09/2018 1043   K 3.5 03/07/2019 0857   CL 102 03/07/2019 0857   CO2 33 (H) 03/07/2019 0857   BUN 20 03/07/2019 0857   BUN 15 12/09/2018 1043   CREATININE 0.63 03/07/2019 0857   CREATININE 0.63 09/03/2018 1452      Component Value Date/Time   CALCIUM 9.3 03/07/2019 0857   ALKPHOS 84 03/07/2019 0857   AST 22 03/07/2019 0857   ALT 23 03/07/2019 0857   BILITOT 0.5 03/07/2019 0857   BILITOT 0.5 12/09/2018 1043

## 2019-03-08 NOTE — Telephone Encounter (Signed)
Patient wanted vitamin D.  Not B12.  Vitamin D added.

## 2019-03-08 NOTE — Assessment & Plan Note (Addendum)
Tolerating statin, encouraged heart healthy diet, avoid trans fats, minimize simple carbs and saturated fats. Increase exercise as tolerated Lab Results  Component Value Date   CHOL 141 03/07/2019   HDL 38.30 (L) 03/07/2019   LDLCALC 76 03/07/2019   LDLDIRECT 129.1 08/23/2007   TRIG 133.0 03/07/2019   CHOLHDL 4 03/07/2019

## 2019-03-15 ENCOUNTER — Telehealth: Payer: Self-pay | Admitting: Internal Medicine

## 2019-03-15 ENCOUNTER — Other Ambulatory Visit: Payer: Self-pay

## 2019-03-15 MED ORDER — COLESTIPOL HCL 1 G PO TABS: 2.0000 g | ORAL_TABLET | Freq: Two times a day (BID) | ORAL | 1 refills | Status: DC

## 2019-03-15 NOTE — Telephone Encounter (Signed)
Spoke to Cindy Velasquez. She as cut down her Colestid as she started to have constipation. She will titrate down and figure out a good dose to manage her diarrhea. Advised to call back if any further problems or questions. Refill also given as she is due.

## 2019-03-15 NOTE — Telephone Encounter (Signed)
Pt requested to schedule a FU for medication colestid prescribed by Dr. Carlean Purl who was on call.  Pt would like a CB to discuss whether she should continue taking this med for acute diarrhea.

## 2019-03-16 ENCOUNTER — Ambulatory Visit (INDEPENDENT_AMBULATORY_CARE_PROVIDER_SITE_OTHER): Payer: Medicare HMO | Admitting: Physician Assistant

## 2019-03-17 ENCOUNTER — Ambulatory Visit (INDEPENDENT_AMBULATORY_CARE_PROVIDER_SITE_OTHER): Payer: Medicare HMO | Admitting: Physician Assistant

## 2019-03-17 ENCOUNTER — Encounter (INDEPENDENT_AMBULATORY_CARE_PROVIDER_SITE_OTHER): Payer: Self-pay | Admitting: Physician Assistant

## 2019-03-17 ENCOUNTER — Other Ambulatory Visit: Payer: Self-pay

## 2019-03-17 DIAGNOSIS — R7303 Prediabetes: Secondary | ICD-10-CM

## 2019-03-17 DIAGNOSIS — E669 Obesity, unspecified: Secondary | ICD-10-CM

## 2019-03-17 DIAGNOSIS — Z6830 Body mass index (BMI) 30.0-30.9, adult: Secondary | ICD-10-CM

## 2019-03-17 DIAGNOSIS — E559 Vitamin D deficiency, unspecified: Secondary | ICD-10-CM

## 2019-03-17 DIAGNOSIS — I10 Essential (primary) hypertension: Secondary | ICD-10-CM | POA: Diagnosis not present

## 2019-03-21 NOTE — Progress Notes (Signed)
Office: [phone]  /  Fax: [phone] TeleHealth Visit:  Cindy Velasquez has verbally consented to this TeleHealth visit today. The patient is located at home, the provider is located at the [facility] office. The participants in this visit include the listed provider and patient. The visit was conducted today via face time.  HPI:   Chief Complaint: OBESITY Cindy Velasquez is here to discuss her progress with her obesity treatment plan. She is on the Category 2 plan and is following her eating plan approximately 80 % of the time. She states she is walking 2 miles 3 times per week. Cindy Velasquez reports that her diarrhea has improved with medicine from her GI doctor. She has been able to eat more on the plan.  We were unable to weigh the patient today for this TeleHealth visit. She feels as if she has lost 2 lbs since her last visit. She has lost 14-16 lbs since starting treatment with [place].  Hypertension Cindy Velasquez is a 78 y.o. female with hypertension. [name]'s last blood pressure was 130/58 at home. She denies chest pain or headaches. She is working on weight loss to help control her blood pressure with the goal of decreasing her risk of heart attack and stroke.   Vitamin D Deficiency Cindy Velasquez has a diagnosis of vitamin D deficiency. She is currently taking prescription Vit D and denies nausea, vomiting or muscle weakness.  Pre-Diabetes Cindy Velasquez has a diagnosis of pre-diabetes based on her elevated Hgb A1c and was informed this puts her at greater risk of developing diabetes. She is not taking medications and continues to work on diet and exercise to decrease risk of diabetes. She denies polyphagia or hypoglycemia.  ASSESSMENT AND PLAN:  Essential hypertension  Vitamin D deficiency - Plan: VITAMIN D 25 Hydroxy (Vit-D Deficiency, Fractures)  Prediabetes - Plan: Hemoglobin A1c, Insulin, random  Class 1 obesity with serious comorbidity and body mass index (BMI) of 30.0 to 30.9 in  adult, unspecified obesity type - Starting BMI greater then 30  PLAN:  Hypertension We discussed sodium restriction, working on healthy weight loss, and a regular exercise program as the means to achieve improved blood pressure control. Cindy Velasquez agreed with this plan and agreed to follow up as directed. We will continue to monitor her blood pressure as well as her progress with the above lifestyle modifications. Cindy Velasquez agrees to continue her medications and continue with weight loss. She will watch for signs of hypotension as she continues her lifestyle modifications. Cindy Velasquez agrees to follow up with our clinic in 3 weeks.  Vitamin D Deficiency Cindy Velasquez was informed that low vitamin D levels contributes to fatigue and are associated with obesity, breast, and colon cancer. Cindy Velasquez agrees to continue taking prescription Vit D @50 ,000 IU every week and will follow up for routine testing of vitamin D, at least 2-3 times per year. She was informed of the risk of over-replacement of vitamin D and agrees to not increase her dose unless she discusses this with [place] first. We will check Vit D level today. Cindy Velasquez agrees to follow up with our clinic in 3 weeks.  Pre-Diabetes Cindy Velasquez will continue to work on weight loss, diet, exercise, and decreasing simple carbohydrates in her diet to help decrease the risk of diabetes. We dicussed metformin including benefits and risks. She was informed that eating too many simple carbohydrates or too many calories at one sitting increases the likelihood of GI side effects. Cindy Velasquez declined metformin for now and a prescription was  not written today. [name] check Hgb A1c and insulin level today. Cindy Velasquez agrees to follow up with our clinic in 3 weeks as directed to monitor her progress.  Obesity Cindy Velasquez is currently in the action stage of change. As such, her goal is to continue with weight loss efforts She has agreed to follow the Category 2 plan Cindy Velasquez has been instructed to work up to a  goal of 150 minutes of combined cardio and strengthening exercise per week for weight loss and overall health benefits. We discussed the following Behavioral Modification Strategies today: increasing lean protein intake and work on meal planning and easy cooking plans   Cindy Velasquez has agreed to follow up with our clinic in 3 weeks. She was informed of the importance of frequent follow up visits to maximize her success with intensive lifestyle modifications for her multiple health conditions.  ALLERGIES: No Known Allergies  MEDICATIONS: Current Outpatient Medications on File Prior to Visit  Medication Sig Dispense Refill  . colestipol (COLESTID) 1 g tablet Take 2 tablets (2 g total) by mouth 2 (two) times daily. 120 tablet 1  . diphenhydramine-acetaminophen (TYLENOL PM) 25-500 MG TABS tablet Take 1 tablet by mouth at bedtime.    . hydrochlorothiazide (HYDRODIURIL) 25 MG tablet TAKE 1/2 TABLET BY MOUTH EVERY MORNING 45 tablet 1  . lisinopril (PRINIVIL,ZESTRIL) 20 MG tablet Take 1 tablet (20 mg total) by mouth daily. 90 tablet 0  . Multiple Vitamins-Minerals (ICAPS AREDS 2) CAPS Take 2 capsules by mouth daily.     . rosuvastatin (CRESTOR) 20 MG tablet TAKE 1 TABLET BY MOUTH EVERY DAY 90 tablet 1  . Vitamin D, Ergocalciferol, (DRISDOL) 1.25 MG (50000 UT) CAPS capsule Take 1 capsule (50,000 Units total) by mouth every 7 (seven) days. 4 capsule 0   No current facility-administered medications on file prior to visit.     PAST MEDICAL HISTORY: Past Medical History:  Diagnosis Date  . Adenomatous colon polyp   . Cancer ([place]) 03/2015   BCC R tibia   . Diverticulosis of colon   . GERD (gastroesophageal reflux disease)   . Hip pain   . Hyperlipidemia   . Hyperplastic colon polyp   . Hypertension   . Lactose intolerance   . Loose stools   . Lower back pain   . Osteoporosis   . Palpitations     PAST SURGICAL HISTORY: Past Surgical History:  Procedure Laterality Date  . EYE SURGERY  2012    B/L 01/2011  02/2011  . HAMMER TOE SURGERY     Bilateral  . ROTATOR CUFF REPAIR     Left  . TUBAL LIGATION      SOCIAL HISTORY: Social History   Tobacco Use  . Smoking status: Former Smoker    Packs/day: 0.50    Years: 30.00    Pack years: 15.00    Types: Cigarettes    Last attempt to quit: 06/02/1991    Years since quitting: 27.8  . Smokeless tobacco: Never Used  Substance Use Topics  . Alcohol use: Yes    Alcohol/week: 5.0 standard drinks    Types: 5 Glasses of wine per week  . Drug use: No    FAMILY HISTORY: Family History  Problem Relation Age of Onset  . Hypertension Mother   . Cancer Mother 59       ?  Marland Kitchen Hyperlipidemia Mother   . Obesity Mother   . Hypertension Father   . Stroke Father   . Heart disease Father   .  Hypertension Brother   . Testicular cancer Brother 36  . Hypertension Brother   . Heart disease Brother        quadruple bypass  . Colon cancer Neg Hx   . Stomach cancer Neg Hx   . Throat cancer Neg Hx     ROS: Review of Systems  Constitutional: Positive for weight loss.  Cardiovascular: Negative for chest pain.  Gastrointestinal: Negative for nausea and vomiting.  Musculoskeletal:       Negative muscle weakness  Neurological: Negative for headaches.  Endo/Heme/Allergies:       Negative polyphagia Negative hypoglycemia    PHYSICAL EXAM: Pt in no acute distress  RECENT LABS AND TESTS: BMET    Component Value Date/Time   NA 143 03/07/2019 0857   NA 143 12/09/2018 1043   K 3.5 03/07/2019 0857   CL 102 03/07/2019 0857   CO2 33 (H) 03/07/2019 0857   GLUCOSE 88 03/07/2019 0857   BUN 20 03/07/2019 0857   BUN 15 12/09/2018 1043   CREATININE 0.63 03/07/2019 0857   CREATININE 0.63 09/03/2018 1452   CALCIUM 9.3 03/07/2019 0857   GFRNONAA 76 12/09/2018 1043   GFRAA 88 12/09/2018 1043   Lab Results  Component Value Date   HGBA1C 5.7 (H) 12/09/2018   HGBA1C 5.8 (H) 09/03/2018   Lab Results  Component Value Date   INSULIN 7.3  12/09/2018   CBC    Component Value Date/Time   WBC 6.5 03/07/2019 0857   RBC 4.51 03/07/2019 0857   HGB 13.6 03/07/2019 0857   HCT 40.6 03/07/2019 0857   PLT 270.0 03/07/2019 0857   MCV 89.9 03/07/2019 0857   MCH 29.2 09/03/2018 1452   MCHC 33.6 03/07/2019 0857   RDW 14.7 03/07/2019 0857   LYMPHSABS 2.1 03/07/2019 0857   MONOABS 0.7 03/07/2019 0857   EOSABS 0.3 03/07/2019 0857   BASOSABS 0.1 03/07/2019 0857   Iron/TIBC/Ferritin/ %Sat No results found for: IRON, TIBC, FERRITIN, IRONPCTSAT Lipid Panel     Component Value Date/Time   CHOL 141 03/07/2019 0857   CHOL 165 12/09/2018 1043   TRIG 133.0 03/07/2019 0857   HDL 38.30 (L) 03/07/2019 0857   HDL 41 12/09/2018 1043   CHOLHDL 4 03/07/2019 0857   VLDL 26.6 03/07/2019 0857   LDLCALC 76 03/07/2019 0857   LDLCALC 90 12/09/2018 1043   LDLCALC 106 (H) 09/03/2018 1452   LDLDIRECT 129.1 08/23/2007 0913   Hepatic Function Panel     Component Value Date/Time   PROT 6.7 03/07/2019 0857   PROT 7.5 12/09/2018 1043   ALBUMIN 3.8 03/07/2019 0857   ALBUMIN 4.4 12/09/2018 1043   AST 22 03/07/2019 0857   ALT 23 03/07/2019 0857   ALKPHOS 84 03/07/2019 0857   BILITOT 0.5 03/07/2019 0857   BILITOT 0.5 12/09/2018 1043   BILIDIR 0.1 02/19/2015 0847      Component Value Date/Time   TSH 1.75 09/03/2018 1452   TSH 1.95 06/17/2018 1722   TSH 1.71 07/02/2017 1109      I, Trixie Dredge, am acting as transcriptionist for Abby Potash, PA-C I, Abby Potash, PA-C have reviewed above note and agree with its content

## 2019-03-30 DIAGNOSIS — E559 Vitamin D deficiency, unspecified: Secondary | ICD-10-CM | POA: Diagnosis not present

## 2019-03-30 DIAGNOSIS — R7303 Prediabetes: Secondary | ICD-10-CM | POA: Diagnosis not present

## 2019-03-31 LAB — HEMOGLOBIN A1C
Est. average glucose Bld gHb Est-mCnc: 117 mg/dL
Hgb A1c MFr Bld: 5.7 % — ABNORMAL HIGH (ref 4.8–5.6)

## 2019-03-31 LAB — VITAMIN D 25 HYDROXY (VIT D DEFICIENCY, FRACTURES): Vit D, 25-Hydroxy: 50.2 ng/mL (ref 30.0–100.0)

## 2019-03-31 LAB — INSULIN, RANDOM: INSULIN: 12.2 u[IU]/mL (ref 2.6–24.9)

## 2019-04-04 ENCOUNTER — Other Ambulatory Visit: Payer: Self-pay

## 2019-04-04 ENCOUNTER — Encounter (INDEPENDENT_AMBULATORY_CARE_PROVIDER_SITE_OTHER): Payer: Self-pay | Admitting: Physician Assistant

## 2019-04-04 ENCOUNTER — Ambulatory Visit (INDEPENDENT_AMBULATORY_CARE_PROVIDER_SITE_OTHER): Payer: Medicare HMO | Admitting: Physician Assistant

## 2019-04-04 DIAGNOSIS — Z6830 Body mass index (BMI) 30.0-30.9, adult: Secondary | ICD-10-CM | POA: Diagnosis not present

## 2019-04-04 DIAGNOSIS — R7303 Prediabetes: Secondary | ICD-10-CM

## 2019-04-04 DIAGNOSIS — E669 Obesity, unspecified: Secondary | ICD-10-CM

## 2019-04-04 DIAGNOSIS — E559 Vitamin D deficiency, unspecified: Secondary | ICD-10-CM

## 2019-04-05 MED ORDER — VITAMIN D3 125 MCG (5000 UT) PO CAPS: 5000.0000 [IU] | ORAL_CAPSULE | Freq: Every day | ORAL | 0 refills | Status: DC

## 2019-04-05 NOTE — Progress Notes (Signed)
Office: [phone]  /  Fax: [phone] TeleHealth Visit:  Cindy Velasquez has verbally consented to this TeleHealth visit today. The patient is located at home, the provider is located at the [facility] office. The participants in this visit include the listed provider and patient and any and all parties involved. The visit was conducted today via FaceTime.  HPI:   Chief Complaint: OBESITY Story is here to discuss her progress with her obesity treatment plan. She is on the Category 2 plan and is following her eating plan approximately 80 % of the time. She states she is walking 1 1/2 to 2 miles 4 to 5 times per week. Cindy Velasquez reports that her diarrhea continues to improve. She is trying to get more protein in throughout the day. We were unable to weigh the patient today for this TeleHealth visit. She feels as if she has lost weight since her last visit. She has lost 16 to 18 lbs since starting treatment with [place].  Vitamin D deficiency Cindy Velasquez has a diagnosis of vitamin D deficiency. She is currently taking vit D and denies nausea, vomiting or muscle weakness.  Pre-Diabetes Cindy Velasquez has a diagnosis of prediabetes based on her elevated Hgb A1c and was informed this puts her at greater risk of developing diabetes. She is not taking medications currently and continues to work on diet and exercise to decrease risk of diabetes. She denies polyphagia.  ASSESSMENT AND PLAN:  Vitamin D deficiency - Plan: Cholecalciferol (VITAMIN D3) 125 MCG (5000 UT) CAPS  Prediabetes  Class 1 obesity with serious comorbidity and body mass index (BMI) of 30.0 to 30.9 in adult, unspecified obesity type - Starting BMI greater then 30  PLAN:  Vitamin D Deficiency Cindy Velasquez was informed that low vitamin D levels contributes to fatigue and are associated with obesity, breast, and colon cancer. She agrees to continue Vit D and change to 5,000 IU daily #30 with no refills. Cindy Velasquez will follow up for routine  testing of vitamin D, at least 2-3 times per year. She was informed of the risk of over-replacement of vitamin D and agrees to not increase her dose unless she discusses this with [place] first. Cindy Velasquez agrees to follow up as directed.  Pre-Diabetes Cindy Velasquez will continue to work on weight loss, exercise, and decreasing simple carbohydrates in her diet to help decrease the risk of diabetes. She was informed that eating too many simple carbohydrates or too many calories at one sitting increases the likelihood of GI side effects. Cindy Velasquez agreed to follow up with [place] as directed to monitor her progress.  Obesity Cindy Velasquez is currently in the action stage of change. As such, her goal is to continue with weight loss efforts She has agreed to follow the Category 2 plan Cindy Velasquez has been instructed to work up to a goal of 150 minutes of combined cardio and strengthening exercise per week for weight loss and overall health benefits. We discussed the following Behavioral Modification Strategies today: keeping healthy foods in the home and  work on meal planning and easy cooking plans  Cindy Velasquez has agreed to follow up with our clinic in 2 weeks. She was informed of the importance of frequent follow up visits to maximize her success with intensive lifestyle modifications for her multiple health conditions.  ALLERGIES: No Known Allergies  MEDICATIONS: Current Outpatient Medications on File Prior to Visit  Medication Sig Dispense Refill  . colestipol (COLESTID) 1 g tablet Take 2 tablets (2 g total) by mouth 2 (  two) times daily. 120 tablet 1  . diphenhydramine-acetaminophen (TYLENOL PM) 25-500 MG TABS tablet Take 1 tablet by mouth at bedtime.    . hydrochlorothiazide (HYDRODIURIL) 25 MG tablet TAKE 1/2 TABLET BY MOUTH EVERY MORNING 45 tablet 1  . lisinopril (PRINIVIL,ZESTRIL) 20 MG tablet Take 1 tablet (20 mg total) by mouth daily. 90 tablet 0  . Multiple Vitamins-Minerals (ICAPS AREDS 2) CAPS Take 2 capsules by mouth  daily.     . rosuvastatin (CRESTOR) 20 MG tablet TAKE 1 TABLET BY MOUTH EVERY DAY 90 tablet 1   No current facility-administered medications on file prior to visit.     PAST MEDICAL HISTORY: Past Medical History:  Diagnosis Date  . Adenomatous colon polyp   . Cancer ([place]) 03/2015   BCC R tibia   . Diverticulosis of colon   . GERD (gastroesophageal reflux disease)   . Hip pain   . Hyperlipidemia   . Hyperplastic colon polyp   . Hypertension   . Lactose intolerance   . Loose stools   . Lower back pain   . Osteoporosis   . Palpitations     PAST SURGICAL HISTORY: Past Surgical History:  Procedure Laterality Date  . EYE SURGERY  2012   B/L 01/2011  02/2011  . HAMMER TOE SURGERY     Bilateral  . ROTATOR CUFF REPAIR     Left  . TUBAL LIGATION      SOCIAL HISTORY: Social History   Tobacco Use  . Smoking status: Former Smoker    Packs/day: 0.50    Years: 30.00    Pack years: 15.00    Types: Cigarettes    Last attempt to quit: 06/02/1991    Years since quitting: 27.8  . Smokeless tobacco: Never Used  Substance Use Topics  . Alcohol use: Yes    Alcohol/week: 5.0 standard drinks    Types: 5 Glasses of wine per week  . Drug use: No    FAMILY HISTORY: Family History  Problem Relation Age of Onset  . Hypertension Mother   . Cancer Mother 51       ?  Marland Kitchen Hyperlipidemia Mother   . Obesity Mother   . Hypertension Father   . Stroke Father   . Heart disease Father   . Hypertension Brother   . Testicular cancer Brother 17  . Hypertension Brother   . Heart disease Brother        quadruple bypass  . Colon cancer Neg Hx   . Stomach cancer Neg Hx   . Throat cancer Neg Hx     ROS: Review of Systems  Constitutional: Positive for weight loss.  Gastrointestinal: Negative for nausea and vomiting.  Musculoskeletal:       Negative for muscle weakness  Endo/Heme/Allergies:       Negative for polyphagia    PHYSICAL EXAM: Pt in no acute distress  RECENT LABS AND  TESTS: BMET    Component Value Date/Time   NA 143 03/07/2019 0857   NA 143 12/09/2018 1043   K 3.5 03/07/2019 0857   CL 102 03/07/2019 0857   CO2 33 (H) 03/07/2019 0857   GLUCOSE 88 03/07/2019 0857   BUN 20 03/07/2019 0857   BUN 15 12/09/2018 1043   CREATININE 0.63 03/07/2019 0857   CREATININE 0.63 09/03/2018 1452   CALCIUM 9.3 03/07/2019 0857   GFRNONAA 76 12/09/2018 1043   GFRAA 88 12/09/2018 1043   Lab Results  Component Value Date   HGBA1C 5.7 (H) 03/30/2019  HGBA1C 5.7 (H) 12/09/2018   HGBA1C 5.8 (H) 09/03/2018   Lab Results  Component Value Date   INSULIN 12.2 03/30/2019   INSULIN 7.3 12/09/2018   CBC    Component Value Date/Time   WBC 6.5 03/07/2019 0857   RBC 4.51 03/07/2019 0857   HGB 13.6 03/07/2019 0857   HCT 40.6 03/07/2019 0857   PLT 270.0 03/07/2019 0857   MCV 89.9 03/07/2019 0857   MCH 29.2 09/03/2018 1452   MCHC 33.6 03/07/2019 0857   RDW 14.7 03/07/2019 0857   LYMPHSABS 2.1 03/07/2019 0857   MONOABS 0.7 03/07/2019 0857   EOSABS 0.3 03/07/2019 0857   BASOSABS 0.1 03/07/2019 0857   Iron/TIBC/Ferritin/ %Sat No results found for: IRON, TIBC, FERRITIN, IRONPCTSAT Lipid Panel     Component Value Date/Time   CHOL 141 03/07/2019 0857   CHOL 165 12/09/2018 1043   TRIG 133.0 03/07/2019 0857   HDL 38.30 (L) 03/07/2019 0857   HDL 41 12/09/2018 1043   CHOLHDL 4 03/07/2019 0857   VLDL 26.6 03/07/2019 0857   LDLCALC 76 03/07/2019 0857   LDLCALC 90 12/09/2018 1043   LDLCALC 106 (H) 09/03/2018 1452   LDLDIRECT 129.1 08/23/2007 0913   Hepatic Function Panel     Component Value Date/Time   PROT 6.7 03/07/2019 0857   PROT 7.5 12/09/2018 1043   ALBUMIN 3.8 03/07/2019 0857   ALBUMIN 4.4 12/09/2018 1043   AST 22 03/07/2019 0857   ALT 23 03/07/2019 0857   ALKPHOS 84 03/07/2019 0857   BILITOT 0.5 03/07/2019 0857   BILITOT 0.5 12/09/2018 1043   BILIDIR 0.1 02/19/2015 0847      Component Value Date/Time   TSH 1.75 09/03/2018 1452   TSH 1.95  06/17/2018 1722   TSH 1.71 07/02/2017 1109     Ref. Range 03/30/2019 09:47  Vitamin D, 25-Hydroxy Latest Ref Range: 30.0 - 100.0 ng/mL 50.2    I, Doreene Nest, am acting as Location manager for Abby Potash, PA-C I, Abby Potash, PA-C have reviewed above note and agree with its content

## 2019-04-12 ENCOUNTER — Encounter (INDEPENDENT_AMBULATORY_CARE_PROVIDER_SITE_OTHER): Payer: Self-pay | Admitting: Physician Assistant

## 2019-04-20 ENCOUNTER — Other Ambulatory Visit: Payer: Self-pay

## 2019-04-20 ENCOUNTER — Ambulatory Visit (INDEPENDENT_AMBULATORY_CARE_PROVIDER_SITE_OTHER): Payer: Medicare HMO | Admitting: Physician Assistant

## 2019-04-20 DIAGNOSIS — E7849 Other hyperlipidemia: Secondary | ICD-10-CM

## 2019-04-20 DIAGNOSIS — Z6830 Body mass index (BMI) 30.0-30.9, adult: Secondary | ICD-10-CM | POA: Diagnosis not present

## 2019-04-20 DIAGNOSIS — E669 Obesity, unspecified: Secondary | ICD-10-CM | POA: Diagnosis not present

## 2019-04-20 NOTE — Progress Notes (Signed)
Office: [phone]  /  Fax: [phone] TeleHealth Visit:  Cindy Velasquez has verbally consented to this TeleHealth visit today. The patient is located at home, the provider is located at the [employer] [facility] office. The participants in this visit include the listed provider and patient. The visit was conducted today via FaceTime.  HPI:   Chief Complaint: OBESITY Cindy Velasquez is here to discuss her progress with her obesity treatment plan. She is on the Category 2 plan and is following her eating plan approximately 80% of the time. She states she is walking 1.5-2 miles 6 times per week. Cindy Velasquez reports that her weight today is 143 lbs. She has been baking more with her grandsons in her house. We were unable to weigh the patient today for this TeleHealth visit. She states her weight today is 143 lbs. She has lost 14 lbs since starting treatment with [place].  Hyperlipidemia Cindy Velasquez has hyperlipidemia and has been trying to improve her cholesterol levels with intensive lifestyle modification including a low saturated fat diet, exercise and weight loss. She is on Crestor and denies any chest pain.  ASSESSMENT AND PLAN:  Other hyperlipidemia  Class 1 obesity with serious comorbidity and body mass index (BMI) of 30.0 to 30.9 in adult, unspecified obesity type - Starting BMI greater then 30  PLAN:  Hyperlipidemia Cindy Velasquez was informed of the American Heart Association Guidelines emphasizing intensive lifestyle modifications as the first line treatment for hyperlipidemia. We discussed many lifestyle modifications today in depth, and Cindy Velasquez will continue to work on decreasing saturated fats such as fatty red meat, butter and many fried foods. She will continue Crestor, increase vegetables and lean protein in her diet, and continue to work on exercise and weight loss efforts.  Obesity Cindy Velasquez is currently in the action stage of change. As such, her goal is to continue with weight loss efforts. She  has agreed to follow the Category 2 plan. Cindy Velasquez has been instructed to work up to a goal of 150 minutes of combined cardio and strengthening exercise per week for weight loss and overall health benefits. We discussed the following Behavioral Modification Strategies today: work on meal planning, easy cooking plans, and keeping healthy foods in the home.  Cindy Velasquez has agreed to follow-up with our clinic in 2 weeks. She was informed of the importance of frequent follow-up visits to maximize her success with intensive lifestyle modifications for her multiple health conditions.  ALLERGIES: No Known Allergies  MEDICATIONS: Current Outpatient Medications on File Prior to Visit  Medication Sig Dispense Refill  . Cholecalciferol (VITAMIN D3) 125 MCG (5000 UT) CAPS Take 1 capsule (5,000 Units total) by mouth daily. 30 capsule 0  . colestipol (COLESTID) 1 g tablet Take 2 tablets (2 g total) by mouth 2 (two) times daily. 120 tablet 1  . diphenhydramine-acetaminophen (TYLENOL PM) 25-500 MG TABS tablet Take 1 tablet by mouth at bedtime.    . hydrochlorothiazide (HYDRODIURIL) 25 MG tablet TAKE 1/2 TABLET BY MOUTH EVERY MORNING 45 tablet 1  . lisinopril (PRINIVIL,ZESTRIL) 20 MG tablet Take 1 tablet (20 mg total) by mouth daily. 90 tablet 0  . Multiple Vitamins-Minerals (ICAPS AREDS 2) CAPS Take 2 capsules by mouth daily.     . rosuvastatin (CRESTOR) 20 MG tablet TAKE 1 TABLET BY MOUTH EVERY DAY 90 tablet 1   No current facility-administered medications on file prior to visit.     PAST MEDICAL HISTORY: Past Medical History:  Diagnosis Date  . Adenomatous colon polyp   .  Cancer ([place]) 03/2015   BCC R tibia   . Diverticulosis of colon   . GERD (gastroesophageal reflux disease)   . Hip pain   . Hyperlipidemia   . Hyperplastic colon polyp   . Hypertension   . Lactose intolerance   . Loose stools   . Lower back pain   . Osteoporosis   . Palpitations     PAST SURGICAL HISTORY: Past Surgical History:   Procedure Laterality Date  . EYE SURGERY  2012   B/L 01/2011  02/2011  . HAMMER TOE SURGERY     Bilateral  . ROTATOR CUFF REPAIR     Left  . TUBAL LIGATION      SOCIAL HISTORY: Social History   Tobacco Use  . Smoking status: Former Smoker    Packs/day: 0.50    Years: 30.00    Pack years: 15.00    Types: Cigarettes    Last attempt to quit: 06/02/1991    Years since quitting: 27.9  . Smokeless tobacco: Never Used  Substance Use Topics  . Alcohol use: Yes    Alcohol/week: 5.0 standard drinks    Types: 5 Glasses of wine per week  . Drug use: No    FAMILY HISTORY: Family History  Problem Relation Age of Onset  . Hypertension Mother   . Cancer Mother 69       ?  Marland Kitchen Hyperlipidemia Mother   . Obesity Mother   . Hypertension Father   . Stroke Father   . Heart disease Father   . Hypertension Brother   . Testicular cancer Brother 38  . Hypertension Brother   . Heart disease Brother        quadruple bypass  . Colon cancer Neg Hx   . Stomach cancer Neg Hx   . Throat cancer Neg Hx    ROS: Review of Systems  Cardiovascular: Negative for chest pain.   PHYSICAL EXAM: Pt in no acute distress  RECENT LABS AND TESTS: BMET    Component Value Date/Time   NA 143 03/07/2019 0857   NA 143 12/09/2018 1043   K 3.5 03/07/2019 0857   CL 102 03/07/2019 0857   CO2 33 (H) 03/07/2019 0857   GLUCOSE 88 03/07/2019 0857   BUN 20 03/07/2019 0857   BUN 15 12/09/2018 1043   CREATININE 0.63 03/07/2019 0857   CREATININE 0.63 09/03/2018 1452   CALCIUM 9.3 03/07/2019 0857   GFRNONAA 76 12/09/2018 1043   GFRAA 88 12/09/2018 1043   Lab Results  Component Value Date   HGBA1C 5.7 (H) 03/30/2019   HGBA1C 5.7 (H) 12/09/2018   HGBA1C 5.8 (H) 09/03/2018   Lab Results  Component Value Date   INSULIN 12.2 03/30/2019   INSULIN 7.3 12/09/2018   CBC    Component Value Date/Time   WBC 6.5 03/07/2019 0857   RBC 4.51 03/07/2019 0857   HGB 13.6 03/07/2019 0857   HCT 40.6 03/07/2019 0857    PLT 270.0 03/07/2019 0857   MCV 89.9 03/07/2019 0857   MCH 29.2 09/03/2018 1452   MCHC 33.6 03/07/2019 0857   RDW 14.7 03/07/2019 0857   LYMPHSABS 2.1 03/07/2019 0857   MONOABS 0.7 03/07/2019 0857   EOSABS 0.3 03/07/2019 0857   BASOSABS 0.1 03/07/2019 0857   Iron/TIBC/Ferritin/ %Sat No results found for: IRON, TIBC, FERRITIN, IRONPCTSAT Lipid Panel     Component Value Date/Time   CHOL 141 03/07/2019 0857   CHOL 165 12/09/2018 1043   TRIG 133.0 03/07/2019 0857   HDL  38.30 (L) 03/07/2019 0857   HDL 41 12/09/2018 1043   CHOLHDL 4 03/07/2019 0857   VLDL 26.6 03/07/2019 0857   LDLCALC 76 03/07/2019 0857   LDLCALC 90 12/09/2018 1043   LDLCALC 106 (H) 09/03/2018 1452   LDLDIRECT 129.1 08/23/2007 0913   Hepatic Function Panel     Component Value Date/Time   PROT 6.7 03/07/2019 0857   PROT 7.5 12/09/2018 1043   ALBUMIN 3.8 03/07/2019 0857   ALBUMIN 4.4 12/09/2018 1043   AST 22 03/07/2019 0857   ALT 23 03/07/2019 0857   ALKPHOS 84 03/07/2019 0857   BILITOT 0.5 03/07/2019 0857   BILITOT 0.5 12/09/2018 1043   BILIDIR 0.1 02/19/2015 0847      Component Value Date/Time   TSH 1.75 09/03/2018 1452   TSH 1.95 06/17/2018 1722   TSH 1.71 07/02/2017 1109   Results for CATE, ORAVEC A (MRN [id number]) as of 04/20/2019 15:07  Ref. Range 03/30/2019 09:47  Vitamin D, 25-Hydroxy Latest Ref Range: 30.0 - 100.0 ng/mL 50.2    I, Michaelene Song, am acting as Location manager for [employer], PA-C I, Abby Potash, PA-C have reviewed above note and agree with its content

## 2019-05-02 ENCOUNTER — Encounter (INDEPENDENT_AMBULATORY_CARE_PROVIDER_SITE_OTHER): Payer: Self-pay | Admitting: Physician Assistant

## 2019-05-05 ENCOUNTER — Ambulatory Visit (INDEPENDENT_AMBULATORY_CARE_PROVIDER_SITE_OTHER): Payer: Medicare HMO | Admitting: Physician Assistant

## 2019-05-05 ENCOUNTER — Other Ambulatory Visit: Payer: Self-pay

## 2019-05-05 ENCOUNTER — Encounter (INDEPENDENT_AMBULATORY_CARE_PROVIDER_SITE_OTHER): Payer: Self-pay | Admitting: Physician Assistant

## 2019-05-05 DIAGNOSIS — E559 Vitamin D deficiency, unspecified: Secondary | ICD-10-CM | POA: Diagnosis not present

## 2019-05-05 DIAGNOSIS — E669 Obesity, unspecified: Secondary | ICD-10-CM | POA: Diagnosis not present

## 2019-05-05 DIAGNOSIS — Z6830 Body mass index (BMI) 30.0-30.9, adult: Secondary | ICD-10-CM

## 2019-05-05 NOTE — Progress Notes (Signed)
Office: [phone]  /  Fax: [phone] TeleHealth Visit:  Cindy Velasquez has verbally consented to this TeleHealth visit today. The patient is located at home, the provider is located at the [employer] [facility] office. The participants in this visit include the listed provider and patient. The visit was conducted today via FaceTime (25 minutes).  HPI:   Chief Complaint: OBESITY Cindy Velasquez is here to discuss her progress with her obesity treatment plan. She is on the Category 2 plan and is following her eating plan approximately 80-85% of the time. She states she is walking 2 miles 7 times per week. Cindy Velasquez reports her weight to be 141 lbs. She counted her snack calories for a few days and realized she was going over on her calories. She was able to reduce her snacks and lost 2 lbs. We were unable to weigh the patient today for this TeleHealth visit. She states her weight was 141 lbs on 05/04/2019. She has lost 14 lbs since starting treatment with [place].  Vitamin D deficiency Cindy Velasquez has a diagnosis of Vitamin D deficiency. She is currently taking OTC Vit D and denies nausea, vomiting or muscle weakness.  ASSESSMENT AND PLAN:  Vitamin D deficiency  Class 1 obesity with serious comorbidity and body mass index (BMI) of 30.0 to 30.9 in adult, unspecified obesity type - Starting BMI greater then 30  PLAN:  Vitamin D Deficiency Cindy Velasquez was informed that low Vitamin D levels contributes to fatigue and are associated with obesity, breast, and colon cancer. She agrees to continue taking OTC Vit D and will follow-up for routine testing of Vitamin D, at least 2-3 times per year. She was informed of the risk of over-replacement of Vitamin D and agrees to not increase her dose unless she discusses this with [place] first. Cindy Velasquez agrees to follow-up with our clinic in 2 weeks.  Obesity Cindy Velasquez is currently in the action stage of change. As such, her goal is to continue with weight loss efforts. She has  agreed to follow the Category 2 plan and journal 300-400 calories + 35 grams of protein at supper. Cindy Velasquez has been instructed to work up to a goal of 150 minutes of combined cardio and strengthening exercise per week for weight loss and overall health benefits. We discussed the following Behavioral Modification Strategies today: work on meal planning, easy cooking plans, and keeping healthy foods in the home.  Cindy Velasquez has agreed to follow-up with our clinic in 2 weeks. She was informed of the importance of frequent follow-up visits to maximize her success with intensive lifestyle modifications for her multiple health conditions.  ALLERGIES: No Known Allergies  MEDICATIONS: Current Outpatient Medications on File Prior to Visit  Medication Sig Dispense Refill  . Cholecalciferol (VITAMIN D3) 125 MCG (5000 UT) CAPS Take 1 capsule (5,000 Units total) by mouth daily. 30 capsule 0  . colestipol (COLESTID) 1 g tablet Take 2 tablets (2 g total) by mouth 2 (two) times daily. 120 tablet 1  . diphenhydramine-acetaminophen (TYLENOL PM) 25-500 MG TABS tablet Take 1 tablet by mouth at bedtime.    . hydrochlorothiazide (HYDRODIURIL) 25 MG tablet TAKE 1/2 TABLET BY MOUTH EVERY MORNING 45 tablet 1  . lisinopril (PRINIVIL,ZESTRIL) 20 MG tablet Take 1 tablet (20 mg total) by mouth daily. 90 tablet 0  . Multiple Vitamins-Minerals (ICAPS AREDS 2) CAPS Take 2 capsules by mouth daily.     . rosuvastatin (CRESTOR) 20 MG tablet TAKE 1 TABLET BY MOUTH EVERY DAY 90 tablet 1  No current facility-administered medications on file prior to visit.     PAST MEDICAL HISTORY: Past Medical History:  Diagnosis Date  . Adenomatous colon polyp   . Cancer ([place]) 03/2015   BCC R tibia   . Diverticulosis of colon   . GERD (gastroesophageal reflux disease)   . Hip pain   . Hyperlipidemia   . Hyperplastic colon polyp   . Hypertension   . Lactose intolerance   . Loose stools   . Lower back pain   . Osteoporosis   .  Palpitations     PAST SURGICAL HISTORY: Past Surgical History:  Procedure Laterality Date  . EYE SURGERY  2012   B/L 01/2011  02/2011  . HAMMER TOE SURGERY     Bilateral  . ROTATOR CUFF REPAIR     Left  . TUBAL LIGATION      SOCIAL HISTORY: Social History   Tobacco Use  . Smoking status: Former Smoker    Packs/day: 0.50    Years: 30.00    Pack years: 15.00    Types: Cigarettes    Last attempt to quit: 06/02/1991    Years since quitting: 27.9  . Smokeless tobacco: Never Used  Substance Use Topics  . Alcohol use: Yes    Alcohol/week: 5.0 standard drinks    Types: 5 Glasses of wine per week  . Drug use: No    FAMILY HISTORY: Family History  Problem Relation Age of Onset  . Hypertension Mother   . Cancer Mother 45       ?  Marland Kitchen Hyperlipidemia Mother   . Obesity Mother   . Hypertension Father   . Stroke Father   . Heart disease Father   . Hypertension Brother   . Testicular cancer Brother 21  . Hypertension Brother   . Heart disease Brother        quadruple bypass  . Colon cancer Neg Hx   . Stomach cancer Neg Hx   . Throat cancer Neg Hx    ROS: Review of Systems  Gastrointestinal: Negative for nausea and vomiting.  Musculoskeletal:       Negative for muscle weakness.   PHYSICAL EXAM: Pt in no acute distress  RECENT LABS AND TESTS: BMET    Component Value Date/Time   NA 143 03/07/2019 0857   NA 143 12/09/2018 1043   K 3.5 03/07/2019 0857   CL 102 03/07/2019 0857   CO2 33 (H) 03/07/2019 0857   GLUCOSE 88 03/07/2019 0857   BUN 20 03/07/2019 0857   BUN 15 12/09/2018 1043   CREATININE 0.63 03/07/2019 0857   CREATININE 0.63 09/03/2018 1452   CALCIUM 9.3 03/07/2019 0857   GFRNONAA 76 12/09/2018 1043   GFRAA 88 12/09/2018 1043   Lab Results  Component Value Date   HGBA1C 5.7 (H) 03/30/2019   HGBA1C 5.7 (H) 12/09/2018   HGBA1C 5.8 (H) 09/03/2018   Lab Results  Component Value Date   INSULIN 12.2 03/30/2019   INSULIN 7.3 12/09/2018   CBC     Component Value Date/Time   WBC 6.5 03/07/2019 0857   RBC 4.51 03/07/2019 0857   HGB 13.6 03/07/2019 0857   HCT 40.6 03/07/2019 0857   PLT 270.0 03/07/2019 0857   MCV 89.9 03/07/2019 0857   MCH 29.2 09/03/2018 1452   MCHC 33.6 03/07/2019 0857   RDW 14.7 03/07/2019 0857   LYMPHSABS 2.1 03/07/2019 0857   MONOABS 0.7 03/07/2019 0857   EOSABS 0.3 03/07/2019 0857   BASOSABS 0.1 03/07/2019  0857   Iron/TIBC/Ferritin/ %Sat No results found for: IRON, TIBC, FERRITIN, IRONPCTSAT Lipid Panel     Component Value Date/Time   CHOL 141 03/07/2019 0857   CHOL 165 12/09/2018 1043   TRIG 133.0 03/07/2019 0857   HDL 38.30 (L) 03/07/2019 0857   HDL 41 12/09/2018 1043   CHOLHDL 4 03/07/2019 0857   VLDL 26.6 03/07/2019 0857   LDLCALC 76 03/07/2019 0857   LDLCALC 90 12/09/2018 1043   LDLCALC 106 (H) 09/03/2018 1452   LDLDIRECT 129.1 08/23/2007 0913   Hepatic Function Panel     Component Value Date/Time   PROT 6.7 03/07/2019 0857   PROT 7.5 12/09/2018 1043   ALBUMIN 3.8 03/07/2019 0857   ALBUMIN 4.4 12/09/2018 1043   AST 22 03/07/2019 0857   ALT 23 03/07/2019 0857   ALKPHOS 84 03/07/2019 0857   BILITOT 0.5 03/07/2019 0857   BILITOT 0.5 12/09/2018 1043   BILIDIR 0.1 02/19/2015 0847      Component Value Date/Time   TSH 1.75 09/03/2018 1452   TSH 1.95 06/17/2018 1722   TSH 1.71 07/02/2017 1109   Results for TAKELIA, URIETA A (MRN [id number]) as of 05/05/2019 11:25  Ref. Range 03/30/2019 09:47  Vitamin D, 25-Hydroxy Latest Ref Range: 30.0 - 100.0 ng/mL 50.2    I, Michaelene Song, am acting as Location manager for [employer], PA-C I, Abby Potash, PA-C have reviewed above note and agree with its content

## 2019-05-15 ENCOUNTER — Other Ambulatory Visit: Payer: Self-pay | Admitting: Internal Medicine

## 2019-05-18 ENCOUNTER — Encounter (INDEPENDENT_AMBULATORY_CARE_PROVIDER_SITE_OTHER): Payer: Self-pay | Admitting: Physician Assistant

## 2019-05-18 ENCOUNTER — Ambulatory Visit (INDEPENDENT_AMBULATORY_CARE_PROVIDER_SITE_OTHER): Payer: Medicare HMO | Admitting: Physician Assistant

## 2019-05-18 ENCOUNTER — Other Ambulatory Visit: Payer: Self-pay

## 2019-05-18 DIAGNOSIS — E559 Vitamin D deficiency, unspecified: Secondary | ICD-10-CM | POA: Diagnosis not present

## 2019-05-18 DIAGNOSIS — Z6830 Body mass index (BMI) 30.0-30.9, adult: Secondary | ICD-10-CM | POA: Diagnosis not present

## 2019-05-18 DIAGNOSIS — E669 Obesity, unspecified: Secondary | ICD-10-CM

## 2019-05-19 NOTE — Progress Notes (Signed)
Office: [phone]  /  Fax: [phone] TeleHealth Visit:  Cindy Velasquez has verbally consented to this TeleHealth visit today. The patient is located at home, the provider is located at the [employer] [facility] office. The participants in this visit include the listed provider and patient and any and all parties involved. The visit was conducted today via FaceTime.  HPI:   Chief Complaint: OBESITY Cindy Velasquez is here to discuss her progress with her obesity treatment plan. She is on the Category 2 plan and is following her eating plan approximately 60 % of the time. She states she is walking 2 miles 7 times per week. Cindy Velasquez's most recent weight is 141 pounds. Her GI doctor put her on a BRAT diet for five days, due to continued diarrhea; therefore she did not get protein in on the plan. She has and in-office appointment with them in 2 weeks for follow up. We were unable to weigh the patient today for this TeleHealth visit. She feels as if she has maintained weight since her last visit. She has lost 17 lbs since starting treatment with [place].  Vitamin D deficiency Cindy Velasquez has a diagnosis of vitamin D deficiency. She is currently taking OTC vit D and denies nausea, vomiting or muscle weakness.  ASSESSMENT AND PLAN:  Vitamin D deficiency  Class 1 obesity with serious comorbidity and body mass index (BMI) of 30.0 to 30.9 in adult, unspecified obesity type  PLAN:  Vitamin D Deficiency Cindy Velasquez was informed that low vitamin D levels contributes to fatigue and are associated with obesity, breast, and colon cancer. She will continue to take OTC Vitamin D and will follow up for routine testing of vitamin D, at least 2-3 times per year. She was informed of the risk of over-replacement of vitamin D and agrees to not increase her dose unless she discusses this with [place] first.  Obesity Cindy Velasquez is currently in the action stage of change. As such, her goal is to continue with weight loss efforts She has  agreed to keep a food journal with 300 to 400 calories and 35 grams of protein at supper daily and follow the Category 2 plan Cindy Velasquez has been instructed to work up to a goal of 150 minutes of combined cardio and strengthening exercise per week for weight loss and overall health benefits. We discussed the following Behavioral Modification Strategies today: increasing lean protein intake and work on meal planning and easy cooking plans  Cindy Velasquez has agreed to follow up with our clinic in 3 weeks. She was informed of the importance of frequent follow up visits to maximize her success with intensive lifestyle modifications for her multiple health conditions.  I spent > than 50% of the 25 minute visit on counseling as documented in the note.   ALLERGIES: No Known Allergies  MEDICATIONS: Current Outpatient Medications on File Prior to Visit  Medication Sig Dispense Refill   Cholecalciferol (VITAMIN D3) 125 MCG (5000 UT) CAPS Take 1 capsule (5,000 Units total) by mouth daily. 30 capsule 0   colestipol (COLESTID) 1 g tablet Take 1 tablet (1 g total) by mouth 2 (two) times daily. 60 tablet 3   diphenhydramine-acetaminophen (TYLENOL PM) 25-500 MG TABS tablet Take 1 tablet by mouth at bedtime.     hydrochlorothiazide (HYDRODIURIL) 25 MG tablet TAKE 1/2 TABLET BY MOUTH EVERY MORNING 45 tablet 1   lisinopril (PRINIVIL,ZESTRIL) 20 MG tablet Take 1 tablet (20 mg total) by mouth daily. 90 tablet 0   Multiple Vitamins-Minerals (ICAPS  AREDS 2) CAPS Take 2 capsules by mouth daily.      rosuvastatin (CRESTOR) 20 MG tablet TAKE 1 TABLET BY MOUTH EVERY DAY 90 tablet 1   No current facility-administered medications on file prior to visit.     PAST MEDICAL HISTORY: Past Medical History:  Diagnosis Date   Adenomatous colon polyp    Cancer ([place]) 03/2015   BCC R tibia    Diverticulosis of colon    GERD (gastroesophageal reflux disease)    Hip pain    Hyperlipidemia    Hyperplastic colon polyp      Hypertension    Lactose intolerance    Loose stools    Lower back pain    Osteoporosis    Palpitations     PAST SURGICAL HISTORY: Past Surgical History:  Procedure Laterality Date   EYE SURGERY  2012   B/L 01/2011  02/2011   HAMMER TOE SURGERY     Bilateral   ROTATOR CUFF REPAIR     Left   TUBAL LIGATION      SOCIAL HISTORY: Social History   Tobacco Use   Smoking status: Former Smoker    Packs/day: 0.50    Years: 30.00    Pack years: 15.00    Types: Cigarettes    Quit date: 06/02/1991    Years since quitting: 27.9   Smokeless tobacco: Never Used  Substance Use Topics   Alcohol use: Yes    Alcohol/week: 5.0 standard drinks    Types: 5 Glasses of wine per week   Drug use: No    FAMILY HISTORY: Family History  Problem Relation Age of Onset   Hypertension Mother    Cancer Mother 4       ?   Hyperlipidemia Mother    Obesity Mother    Hypertension Father    Stroke Father    Heart disease Father    Hypertension Brother    Testicular cancer Brother 109   Hypertension Brother    Heart disease Brother        quadruple bypass   Colon cancer Neg Hx    Stomach cancer Neg Hx    Throat cancer Neg Hx     ROS: Review of Systems  Constitutional: Negative for weight loss.  Gastrointestinal: Negative for nausea and vomiting.  Musculoskeletal:       Negative for muscle weakness    PHYSICAL EXAM: Pt in no acute distress  RECENT LABS AND TESTS: BMET    Component Value Date/Time   NA 143 03/07/2019 0857   NA 143 12/09/2018 1043   K 3.5 03/07/2019 0857   CL 102 03/07/2019 0857   CO2 33 (H) 03/07/2019 0857   GLUCOSE 88 03/07/2019 0857   BUN 20 03/07/2019 0857   BUN 15 12/09/2018 1043   CREATININE 0.63 03/07/2019 0857   CREATININE 0.63 09/03/2018 1452   CALCIUM 9.3 03/07/2019 0857   GFRNONAA 76 12/09/2018 1043   GFRAA 88 12/09/2018 1043   Lab Results  Component Value Date   HGBA1C 5.7 (H) 03/30/2019   HGBA1C 5.7 (H)  12/09/2018   HGBA1C 5.8 (H) 09/03/2018   Lab Results  Component Value Date   INSULIN 12.2 03/30/2019   INSULIN 7.3 12/09/2018   CBC    Component Value Date/Time   WBC 6.5 03/07/2019 0857   RBC 4.51 03/07/2019 0857   HGB 13.6 03/07/2019 0857   HCT 40.6 03/07/2019 0857   PLT 270.0 03/07/2019 0857   MCV 89.9 03/07/2019 0857   MCH  29.2 09/03/2018 1452   MCHC 33.6 03/07/2019 0857   RDW 14.7 03/07/2019 0857   LYMPHSABS 2.1 03/07/2019 0857   MONOABS 0.7 03/07/2019 0857   EOSABS 0.3 03/07/2019 0857   BASOSABS 0.1 03/07/2019 0857   Iron/TIBC/Ferritin/ %Sat No results found for: IRON, TIBC, FERRITIN, IRONPCTSAT Lipid Panel     Component Value Date/Time   CHOL 141 03/07/2019 0857   CHOL 165 12/09/2018 1043   TRIG 133.0 03/07/2019 0857   HDL 38.30 (L) 03/07/2019 0857   HDL 41 12/09/2018 1043   CHOLHDL 4 03/07/2019 0857   VLDL 26.6 03/07/2019 0857   LDLCALC 76 03/07/2019 0857   LDLCALC 90 12/09/2018 1043   LDLCALC 106 (H) 09/03/2018 1452   LDLDIRECT 129.1 08/23/2007 0913   Hepatic Function Panel     Component Value Date/Time   PROT 6.7 03/07/2019 0857   PROT 7.5 12/09/2018 1043   ALBUMIN 3.8 03/07/2019 0857   ALBUMIN 4.4 12/09/2018 1043   AST 22 03/07/2019 0857   ALT 23 03/07/2019 0857   ALKPHOS 84 03/07/2019 0857   BILITOT 0.5 03/07/2019 0857   BILITOT 0.5 12/09/2018 1043   BILIDIR 0.1 02/19/2015 0847      Component Value Date/Time   TSH 1.75 09/03/2018 1452   TSH 1.95 06/17/2018 1722   TSH 1.71 07/02/2017 1109     Ref. Range 03/30/2019 09:47  Vitamin D, 25-Hydroxy Latest Ref Range: 30.0 - 100.0 ng/mL 50.2    I, Doreene Nest, am acting as Location manager for Abby Potash, PA-C I, Abby Potash, PA-C have reviewed above note and agree with its conten

## 2019-06-02 ENCOUNTER — Encounter: Payer: Self-pay | Admitting: *Deleted

## 2019-06-03 ENCOUNTER — Encounter: Payer: Self-pay | Admitting: Internal Medicine

## 2019-06-03 ENCOUNTER — Ambulatory Visit (INDEPENDENT_AMBULATORY_CARE_PROVIDER_SITE_OTHER): Payer: Medicare HMO | Admitting: Internal Medicine

## 2019-06-03 VITALS — Ht 60.0 in | Wt 141.0 lb

## 2019-06-03 DIAGNOSIS — R197 Diarrhea, unspecified: Secondary | ICD-10-CM

## 2019-06-03 DIAGNOSIS — R152 Fecal urgency: Secondary | ICD-10-CM | POA: Diagnosis not present

## 2019-06-03 MED ORDER — METRONIDAZOLE 250 MG PO TABS: 250.0000 mg | ORAL_TABLET | Freq: Three times a day (TID) | ORAL | 0 refills | Status: DC

## 2019-06-03 NOTE — Patient Instructions (Signed)
You will need to go to the basement for labs at [facility]  ( 7:30am - 5pm)  We have sent in your prescription to your pharmacy  Contact the office in 3-4 weeks with an update of your condition   Thank you for choosing [facility]  Scarlette Shorts, MD

## 2019-06-03 NOTE — Addendum Note (Signed)
Addended by: Oda Kilts on: 06/03/2019 03:08 PM   Modules accepted: Orders

## 2019-06-03 NOTE — Progress Notes (Signed)
HISTORY OF PRESENT ILLNESS:  Cindy Velasquez is a 78 y.o. female, native of [place], who presents today for follow-up regarding problems with chronic diarrhea which began approximately 1 year ago.  She was last seen in the office January 18, 2019 after having undergone colonoscopy with biopsies.  She was found to have incidental diverticulosis.  Random colon biopsies were unremarkable.  At the time of her last visit she initiated Colestid 2 g twice daily.  She tells me that since that time problems with postprandial diarrhea have improved though incompletely.  Previously going 12 times per day.  Currently about 6 times per day.  She continues with occasional nocturnal symptoms.  Typically has problems with loose bowels 30 minutes to 2 hours after eating.  Often urgency.  Rarely has abdominal cramping.  This can affect her social life.  She denies ever having had difficulties with her bowels prior to last year.  No new medications.  She does not use sugar substitutes.  Rarely has diet drinks.  She has tried different dietary items without success.  Blood work from March 07, 2019 finds unremarkable comprehensive metabolic panel.  Normal liver tests.  Normal CBC.  Normal TSH last year.  REVIEW OF SYSTEMS:  All non-GI ROS negative unless otherwise stated in the HPI except for arthritis  Past Medical History:  Diagnosis Date  . Adenomatous colon polyp   . Cancer ([place]) 03/2015   BCC R tibia   . Diverticulosis of colon   . GERD (gastroesophageal reflux disease)   . Hip pain   . Hyperlipidemia   . Hyperplastic colon polyp   . Hypertension   . Lactose intolerance   . Loose stools   . Lower back pain   . Osteoporosis   . Palpitations     Past Surgical History:  Procedure Laterality Date  . EYE SURGERY  2012   B/L 01/2011  02/2011  . HAMMER TOE SURGERY     Bilateral  . ROTATOR CUFF REPAIR     Left  . TUBAL LIGATION      Social History Cindy Velasquez  reports that she quit smoking  about 28 years ago. Her smoking use included cigarettes. She has a 15.00 pack-year smoking history. She has never used smokeless tobacco. She reports current alcohol use of about 5.0 standard drinks of alcohol per week. She reports that she does not use drugs.  family history includes Cancer (age of onset: 67) in her mother; Heart disease in her brother and father; Hyperlipidemia in her mother; Hypertension in her brother, brother, father, and mother; Obesity in her mother; Stroke in her father; Testicular cancer (age of onset: 12) in her brother.  No Known Allergies     PHYSICAL EXAMINATION: No physical examination with telehealth visit   ASSESSMENT:  1.  1 year history of chronic postprandial diarrhea.  Colonoscopy with biopsies unrevealing.  Colestid therapy helping nonspecifically, though incompletely.  Other considerations at this point include celiac disease, bacterial overgrowth, occult infection such as Giardia.  Also consider pancreatic insufficiency. 2.  Recent colonoscopy as described  PLAN:  1.  OBTAIN laboratories tissue transglutaminase antibody IgA, serum IgA level 2.  OBTAIN stool for fecal elastase 3.  PRESCRIBE metronidazole 250 mg 3 times daily; #30; no refills 4.  I have asked patient to contact the office in 3 to 4 weeks with an update on her condition.  If she is continuing to have symptoms then consider empiric therapy such as pancreatic enzyme supplements  and/or antidiarrheal such as Imodium or Lomotil.  This telehealth medicine visit was initiated by and consented for by the patient.  She was in her home and I was in my office during the encounter.  She understands her may be associated professional charge for this comprehensive service which totaled approximately 25 minutes

## 2019-06-06 ENCOUNTER — Other Ambulatory Visit: Payer: Self-pay

## 2019-06-06 ENCOUNTER — Telehealth (INDEPENDENT_AMBULATORY_CARE_PROVIDER_SITE_OTHER): Payer: Medicare HMO | Admitting: Physician Assistant

## 2019-06-06 ENCOUNTER — Encounter (INDEPENDENT_AMBULATORY_CARE_PROVIDER_SITE_OTHER): Payer: Self-pay | Admitting: Physician Assistant

## 2019-06-06 DIAGNOSIS — E559 Vitamin D deficiency, unspecified: Secondary | ICD-10-CM | POA: Diagnosis not present

## 2019-06-06 DIAGNOSIS — Z6830 Body mass index (BMI) 30.0-30.9, adult: Secondary | ICD-10-CM | POA: Diagnosis not present

## 2019-06-06 DIAGNOSIS — E669 Obesity, unspecified: Secondary | ICD-10-CM

## 2019-06-06 NOTE — Progress Notes (Signed)
Office: [phone]  /  Fax: [phone] TeleHealth Visit:  Cindy Velasquez has verbally consented to this TeleHealth visit today. The patient is located at home, the provider is located at the [facility] office. The participants in this visit include the listed provider and patient. The visit was conducted today via FaceTime (25 minutes).  HPI:   Chief Complaint: OBESITY Cindy Velasquez is here to discuss her progress with her obesity treatment plan. She is on the Category 2 plan and is following her eating plan approximately 75-80% of the time. She states she is walking 2 miles 7 days a week for 40 minutes.  Cindy Velasquez reports that she continues with diarrhea. Her GI doctor started her on metronidazole. She is doing a [employer] a few days a week. We were unable to weigh the patient today for this TeleHealth visit. She feels as if she has maintained her weight since her last visit. She has lost 14 lbs since starting treatment with [place].  Vitamin D deficiency Cindy Velasquez has a diagnosis of Vitamin D deficiency. She is currently taking OTC Vit D and denies nausea, vomiting or muscle weakness.  ASSESSMENT AND PLAN:  Vitamin D deficiency  Class 1 obesity with serious comorbidity and body mass index (BMI) of 30.0 to 30.9 in adult, unspecified obesity type  PLAN:  Vitamin D Deficiency Cindy Velasquez was informed that low Vitamin D levels contributes to fatigue and are associated with obesity, breast, and colon cancer. She agrees to continue taking OTC Vit D and will follow-up for routine testing of Vitamin D, at least 2-3 times per year. She was informed of the risk of over-replacement of Vitamin D and agrees to not increase her dose unless she discusses this with [place] first. Cindy Velasquez agrees to follow-up with our clinic in 2 weeks.  Obesity Cindy Velasquez is currently in the action stage of change. As such, her goal is to continue with weight loss efforts. She has agreed to follow the Category 2 plan. Cindy Velasquez has  been instructed to work up to a goal of 150 minutes of combined cardio and strengthening exercise per week for weight loss and overall health benefits. We discussed the following Behavioral Modification Strategies today: increasing lean protein intake, work on meal planning and easy cooking plans.  Cindy Velasquez has agreed to follow-up with our clinic in 2 weeks. She was informed of the importance of frequent follow-up visits to maximize her success with intensive lifestyle modifications for her multiple health conditions.  I spent > than 50% of the 25 minute visit on counseling as documented in the note.   ALLERGIES: No Known Allergies  MEDICATIONS: Current Outpatient Medications on File Prior to Visit  Medication Sig Dispense Refill   Cholecalciferol (VITAMIN D3) 125 MCG (5000 UT) CAPS Take 1 capsule (5,000 Units total) by mouth daily. 30 capsule 0   colestipol (COLESTID) 1 g tablet Take 1 tablet (1 g total) by mouth 2 (two) times daily. 60 tablet 3   diphenhydramine-acetaminophen (TYLENOL PM) 25-500 MG TABS tablet Take 1 tablet by mouth at bedtime.     hydrochlorothiazide (HYDRODIURIL) 25 MG tablet TAKE 1/2 TABLET BY MOUTH EVERY MORNING 45 tablet 1   lisinopril (PRINIVIL,ZESTRIL) 20 MG tablet Take 1 tablet (20 mg total) by mouth daily. 90 tablet 0   metroNIDAZOLE (FLAGYL) 250 MG tablet Take 1 tablet (250 mg total) by mouth 3 (three) times daily. 30 tablet 0   Multiple Vitamins-Minerals (ICAPS AREDS 2) CAPS Take 2 capsules by mouth daily.  rosuvastatin (CRESTOR) 20 MG tablet TAKE 1 TABLET BY MOUTH EVERY DAY 90 tablet 1   No current facility-administered medications on file prior to visit.     PAST MEDICAL HISTORY: Past Medical History:  Diagnosis Date   Adenomatous colon polyp    Cancer ([place]) 03/2015   BCC R tibia    Diverticulosis of colon    GERD (gastroesophageal reflux disease)    Hip pain    Hyperlipidemia    Hyperplastic colon polyp    Hypertension     Lactose intolerance    Loose stools    Lower back pain    Osteoporosis    Palpitations     PAST SURGICAL HISTORY: Past Surgical History:  Procedure Laterality Date   EYE SURGERY  2012   B/L 01/2011  02/2011   HAMMER TOE SURGERY     Bilateral   ROTATOR CUFF REPAIR     Left   TUBAL LIGATION      SOCIAL HISTORY: Social History   Tobacco Use   Smoking status: Former Smoker    Packs/day: 0.50    Years: 30.00    Pack years: 15.00    Types: Cigarettes    Quit date: 06/02/1991    Years since quitting: 28.0   Smokeless tobacco: Never Used  Substance Use Topics   Alcohol use: Yes    Alcohol/week: 5.0 standard drinks    Types: 5 Glasses of wine per week   Drug use: No    FAMILY HISTORY: Family History  Problem Relation Age of Onset   Hypertension Mother    Cancer Mother 67       ?   Hyperlipidemia Mother    Obesity Mother    Hypertension Father    Stroke Father    Heart disease Father    Hypertension Brother    Testicular cancer Brother 73   Hypertension Brother    Heart disease Brother        quadruple bypass   Colon cancer Neg Hx    Stomach cancer Neg Hx    Throat cancer Neg Hx    ROS: Review of Systems  Gastrointestinal: Negative for nausea and vomiting.  Musculoskeletal:       Negative for muscle weakness.   PHYSICAL EXAM: Pt in no acute distress  RECENT LABS AND TESTS: BMET    Component Value Date/Time   NA 143 03/07/2019 0857   NA 143 12/09/2018 1043   K 3.5 03/07/2019 0857   CL 102 03/07/2019 0857   CO2 33 (H) 03/07/2019 0857   GLUCOSE 88 03/07/2019 0857   BUN 20 03/07/2019 0857   BUN 15 12/09/2018 1043   CREATININE 0.63 03/07/2019 0857   CREATININE 0.63 09/03/2018 1452   CALCIUM 9.3 03/07/2019 0857   GFRNONAA 76 12/09/2018 1043   GFRAA 88 12/09/2018 1043   Lab Results  Component Value Date   HGBA1C 5.7 (H) 03/30/2019   HGBA1C 5.7 (H) 12/09/2018   HGBA1C 5.8 (H) 09/03/2018   Lab Results  Component Value  Date   INSULIN 12.2 03/30/2019   INSULIN 7.3 12/09/2018   CBC    Component Value Date/Time   WBC 6.5 03/07/2019 0857   RBC 4.51 03/07/2019 0857   HGB 13.6 03/07/2019 0857   HCT 40.6 03/07/2019 0857   PLT 270.0 03/07/2019 0857   MCV 89.9 03/07/2019 0857   MCH 29.2 09/03/2018 1452   MCHC 33.6 03/07/2019 0857   RDW 14.7 03/07/2019 0857   LYMPHSABS 2.1 03/07/2019 0857  MONOABS 0.7 03/07/2019 0857   EOSABS 0.3 03/07/2019 0857   BASOSABS 0.1 03/07/2019 0857   Iron/TIBC/Ferritin/ %Sat No results found for: IRON, TIBC, FERRITIN, IRONPCTSAT Lipid Panel     Component Value Date/Time   CHOL 141 03/07/2019 0857   CHOL 165 12/09/2018 1043   TRIG 133.0 03/07/2019 0857   HDL 38.30 (L) 03/07/2019 0857   HDL 41 12/09/2018 1043   CHOLHDL 4 03/07/2019 0857   VLDL 26.6 03/07/2019 0857   LDLCALC 76 03/07/2019 0857   LDLCALC 90 12/09/2018 1043   LDLCALC 106 (H) 09/03/2018 1452   LDLDIRECT 129.1 08/23/2007 0913   Hepatic Function Panel     Component Value Date/Time   PROT 6.7 03/07/2019 0857   PROT 7.5 12/09/2018 1043   ALBUMIN 3.8 03/07/2019 0857   ALBUMIN 4.4 12/09/2018 1043   AST 22 03/07/2019 0857   ALT 23 03/07/2019 0857   ALKPHOS 84 03/07/2019 0857   BILITOT 0.5 03/07/2019 0857   BILITOT 0.5 12/09/2018 1043   BILIDIR 0.1 02/19/2015 0847      Component Value Date/Time   TSH 1.75 09/03/2018 1452   TSH 1.95 06/17/2018 1722   TSH 1.71 07/02/2017 1109   Results for BRIDNEY, GUADARRAMA A (MRN [id number]) as of 06/06/2019 15:58  Ref. Range 03/30/2019 09:47  Vitamin D, 25-Hydroxy Latest Ref Range: 30.0 - 100.0 ng/mL 50.2    I, Michaelene Song, am acting as Location manager for [employer], PA-C

## 2019-06-08 ENCOUNTER — Telehealth: Payer: Self-pay | Admitting: Internal Medicine

## 2019-06-08 NOTE — Telephone Encounter (Signed)
Lm on vm.  If patient calls back, I talked to [facility] and they cannot draw her labs there.  She either needs to come here or go to [facility] where her primary care doctor is located (Dr. Etter Sjogren)

## 2019-06-08 NOTE — Telephone Encounter (Signed)
Patient called in wanting to speak with the nurse. She stated that she is schedule to have an IGA test done and she tried to schedule it at [facility] however they are needing something from [place] so that she can have it done there. Please call and advise.

## 2019-06-08 NOTE — Telephone Encounter (Signed)
Patient called back and I advised. She understood.

## 2019-06-09 ENCOUNTER — Other Ambulatory Visit (INDEPENDENT_AMBULATORY_CARE_PROVIDER_SITE_OTHER): Payer: Medicare HMO

## 2019-06-09 DIAGNOSIS — R152 Fecal urgency: Secondary | ICD-10-CM

## 2019-06-09 DIAGNOSIS — R197 Diarrhea, unspecified: Secondary | ICD-10-CM

## 2019-06-09 LAB — IGA: IgA: 179 mg/dL (ref 68–378)

## 2019-06-13 ENCOUNTER — Other Ambulatory Visit: Payer: Medicare HMO

## 2019-06-13 DIAGNOSIS — R197 Diarrhea, unspecified: Secondary | ICD-10-CM | POA: Diagnosis not present

## 2019-06-13 DIAGNOSIS — R152 Fecal urgency: Secondary | ICD-10-CM

## 2019-06-13 LAB — TISSUE TRANSGLUTAMINASE, IGA: (tTG) Ab, IgA: 2 U/mL

## 2019-06-17 LAB — PANCREATIC ELASTASE, FECAL: Pancreatic Elastase-1, Stool: 289 mcg/g

## 2019-06-20 ENCOUNTER — Telehealth (INDEPENDENT_AMBULATORY_CARE_PROVIDER_SITE_OTHER): Payer: Medicare HMO | Admitting: Physician Assistant

## 2019-06-20 ENCOUNTER — Other Ambulatory Visit: Payer: Self-pay

## 2019-06-20 DIAGNOSIS — E669 Obesity, unspecified: Secondary | ICD-10-CM | POA: Diagnosis not present

## 2019-06-20 DIAGNOSIS — Z6830 Body mass index (BMI) 30.0-30.9, adult: Secondary | ICD-10-CM | POA: Diagnosis not present

## 2019-06-20 DIAGNOSIS — E559 Vitamin D deficiency, unspecified: Secondary | ICD-10-CM

## 2019-06-20 NOTE — Progress Notes (Signed)
Office: [phone]  /  Fax: [phone] TeleHealth Visit:  Cindy Velasquez has verbally consented to this TeleHealth visit today. The patient is located at home, the provider is located at the [facility] office. The participants in this visit include the listed provider and patient. The visit was conducted today via FaceTime (25 minutes).  HPI:   Chief Complaint: OBESITY Cindy Velasquez is here to discuss her progress with her obesity treatment plan. She is on the Category 2 plan and is following her eating plan approximately 80% of the time. She states she is walking 2 miles daily. Cindy Velasquez states her weight today is 141 lbs. She reports that she finished her course of metronidazole from her GI doctor and her diarrhea episodes have decreased to 2-3 a day. She is back to eating on plan for the last 4 days. We were unable to weigh the patient today for this TeleHealth visit. She feels as if she has lost 1.5 lbs since her last visit. She has lost 14 lbs since starting treatment with [place].  Vitamin D deficiency Cindy Velasquez has a diagnosis of Vitamin D deficiency. She is currently taking OTC Vit D and denies nausea, vomiting or muscle weakness.  ASSESSMENT AND PLAN:  Vitamin D deficiency  Class 1 obesity with serious comorbidity and body mass index (BMI) of 30.0 to 30.9 in adult, unspecified obesity type  PLAN:  Vitamin D Deficiency Cindy Velasquez was informed that low Vitamin D levels contributes to fatigue and are associated with obesity, breast, and colon cancer. She agrees to continue taking OTC Vit D and will follow-up for routine testing of Vitamin D, at least 2-3 times per year. She was informed of the risk of over-replacement of Vitamin D and agrees to not increase her dose unless she discusses this with [place] first. Cindy Velasquez agrees to follow-up with our clinic in 3 weeks.  Obesity Cindy Velasquez is currently in the action stage of change. As such, her goal is to continue with weight loss efforts. She  has agreed to follow the Category 2 plan. Cindy Velasquez has been instructed to work up to a goal of 150 minutes of combined cardio and strengthening exercise per week for weight loss and overall health benefits. We discussed the following Behavioral Modification Strategies today: work on meal planning and easy cooking plans, and ways to avoid boredom eating.  Cindy Velasquez has agreed to follow-up with our clinic in 3 weeks. She was informed of the importance of frequent follow-up visits to maximize her success with intensive lifestyle modifications for her multiple health conditions.  I spent > than 50% of the 25 minute visit on counseling as documented in the note.   ALLERGIES: No Known Allergies  MEDICATIONS: Current Outpatient Medications on File Prior to Visit  Medication Sig Dispense Refill   Cholecalciferol (VITAMIN D3) 125 MCG (5000 UT) CAPS Take 1 capsule (5,000 Units total) by mouth daily. 30 capsule 0   colestipol (COLESTID) 1 g tablet Take 1 tablet (1 g total) by mouth 2 (two) times daily. 60 tablet 3   diphenhydramine-acetaminophen (TYLENOL PM) 25-500 MG TABS tablet Take 1 tablet by mouth at bedtime.     hydrochlorothiazide (HYDRODIURIL) 25 MG tablet TAKE 1/2 TABLET BY MOUTH EVERY MORNING 45 tablet 1   lisinopril (PRINIVIL,ZESTRIL) 20 MG tablet Take 1 tablet (20 mg total) by mouth daily. 90 tablet 0   metroNIDAZOLE (FLAGYL) 250 MG tablet Take 1 tablet (250 mg total) by mouth 3 (three) times daily. 30 tablet 0   Multiple  Vitamins-Minerals (ICAPS AREDS 2) CAPS Take 2 capsules by mouth daily.      rosuvastatin (CRESTOR) 20 MG tablet TAKE 1 TABLET BY MOUTH EVERY DAY 90 tablet 1   No current facility-administered medications on file prior to visit.     PAST MEDICAL HISTORY: Past Medical History:  Diagnosis Date   Adenomatous colon polyp    Cancer ([place]) 03/2015   BCC R tibia    Diverticulosis of colon    GERD (gastroesophageal reflux disease)    Hip pain    Hyperlipidemia      Hyperplastic colon polyp    Hypertension    Lactose intolerance    Loose stools    Lower back pain    Osteoporosis    Palpitations     PAST SURGICAL HISTORY: Past Surgical History:  Procedure Laterality Date   EYE SURGERY  2012   B/L 01/2011  02/2011   HAMMER TOE SURGERY     Bilateral   ROTATOR CUFF REPAIR     Left   TUBAL LIGATION      SOCIAL HISTORY: Social History   Tobacco Use   Smoking status: Former Smoker    Packs/day: 0.50    Years: 30.00    Pack years: 15.00    Types: Cigarettes    Quit date: 06/02/1991    Years since quitting: 28.0   Smokeless tobacco: Never Used  Substance Use Topics   Alcohol use: Yes    Alcohol/week: 5.0 standard drinks    Types: 5 Glasses of wine per week   Drug use: No    FAMILY HISTORY: Family History  Problem Relation Age of Onset   Hypertension Mother    Cancer Mother 22       ?   Hyperlipidemia Mother    Obesity Mother    Hypertension Father    Stroke Father    Heart disease Father    Hypertension Brother    Testicular cancer Brother 50   Hypertension Brother    Heart disease Brother        quadruple bypass   Colon cancer Neg Hx    Stomach cancer Neg Hx    Throat cancer Neg Hx    ROS: Review of Systems  Gastrointestinal: Negative for nausea and vomiting.  Musculoskeletal:       Negative for muscle weakness.   PHYSICAL EXAM: Pt in no acute distress  RECENT LABS AND TESTS: BMET    Component Value Date/Time   NA 143 03/07/2019 0857   NA 143 12/09/2018 1043   K 3.5 03/07/2019 0857   CL 102 03/07/2019 0857   CO2 33 (H) 03/07/2019 0857   GLUCOSE 88 03/07/2019 0857   BUN 20 03/07/2019 0857   BUN 15 12/09/2018 1043   CREATININE 0.63 03/07/2019 0857   CREATININE 0.63 09/03/2018 1452   CALCIUM 9.3 03/07/2019 0857   GFRNONAA 76 12/09/2018 1043   GFRAA 88 12/09/2018 1043   Lab Results  Component Value Date   HGBA1C 5.7 (H) 03/30/2019   HGBA1C 5.7 (H) 12/09/2018   HGBA1C 5.8  (H) 09/03/2018   Lab Results  Component Value Date   INSULIN 12.2 03/30/2019   INSULIN 7.3 12/09/2018   CBC    Component Value Date/Time   WBC 6.5 03/07/2019 0857   RBC 4.51 03/07/2019 0857   HGB 13.6 03/07/2019 0857   HCT 40.6 03/07/2019 0857   PLT 270.0 03/07/2019 0857   MCV 89.9 03/07/2019 0857   MCH 29.2 09/03/2018 1452   MCHC  33.6 03/07/2019 0857   RDW 14.7 03/07/2019 0857   LYMPHSABS 2.1 03/07/2019 0857   MONOABS 0.7 03/07/2019 0857   EOSABS 0.3 03/07/2019 0857   BASOSABS 0.1 03/07/2019 0857   Iron/TIBC/Ferritin/ %Sat No results found for: IRON, TIBC, FERRITIN, IRONPCTSAT Lipid Panel     Component Value Date/Time   CHOL 141 03/07/2019 0857   CHOL 165 12/09/2018 1043   TRIG 133.0 03/07/2019 0857   HDL 38.30 (L) 03/07/2019 0857   HDL 41 12/09/2018 1043   CHOLHDL 4 03/07/2019 0857   VLDL 26.6 03/07/2019 0857   LDLCALC 76 03/07/2019 0857   LDLCALC 90 12/09/2018 1043   LDLCALC 106 (H) 09/03/2018 1452   LDLDIRECT 129.1 08/23/2007 0913   Hepatic Function Panel     Component Value Date/Time   PROT 6.7 03/07/2019 0857   PROT 7.5 12/09/2018 1043   ALBUMIN 3.8 03/07/2019 0857   ALBUMIN 4.4 12/09/2018 1043   AST 22 03/07/2019 0857   ALT 23 03/07/2019 0857   ALKPHOS 84 03/07/2019 0857   BILITOT 0.5 03/07/2019 0857   BILITOT 0.5 12/09/2018 1043   BILIDIR 0.1 02/19/2015 0847      Component Value Date/Time   TSH 1.75 09/03/2018 1452   TSH 1.95 06/17/2018 1722   TSH 1.71 07/02/2017 1109   Results for ASHYAH, QUIZON A (MRN [id number]) as of 06/20/2019 16:12  Ref. Range 03/30/2019 09:47  Vitamin D, 25-Hydroxy Latest Ref Range: 30.0 - 100.0 ng/mL 50.2   I, Michaelene Song, am acting as Location manager for [employer], PA-C I, Abby Potash, PA-C have reviewed above note and agree with its content

## 2019-07-06 DIAGNOSIS — Z961 Presence of intraocular lens: Secondary | ICD-10-CM | POA: Diagnosis not present

## 2019-07-06 DIAGNOSIS — H0102B Squamous blepharitis left eye, upper and lower eyelids: Secondary | ICD-10-CM | POA: Diagnosis not present

## 2019-07-06 DIAGNOSIS — H04223 Epiphora due to insufficient drainage, bilateral lacrimal glands: Secondary | ICD-10-CM | POA: Diagnosis not present

## 2019-07-06 DIAGNOSIS — H26493 Other secondary cataract, bilateral: Secondary | ICD-10-CM | POA: Diagnosis not present

## 2019-07-06 DIAGNOSIS — H353132 Nonexudative age-related macular degeneration, bilateral, intermediate dry stage: Secondary | ICD-10-CM | POA: Diagnosis not present

## 2019-07-06 DIAGNOSIS — H0102A Squamous blepharitis right eye, upper and lower eyelids: Secondary | ICD-10-CM | POA: Diagnosis not present

## 2019-07-06 DIAGNOSIS — H10413 Chronic giant papillary conjunctivitis, bilateral: Secondary | ICD-10-CM | POA: Diagnosis not present

## 2019-07-10 ENCOUNTER — Other Ambulatory Visit: Payer: Self-pay | Admitting: Family Medicine

## 2019-07-11 ENCOUNTER — Ambulatory Visit (INDEPENDENT_AMBULATORY_CARE_PROVIDER_SITE_OTHER): Payer: Medicare HMO | Admitting: Internal Medicine

## 2019-07-11 ENCOUNTER — Encounter: Payer: Self-pay | Admitting: Internal Medicine

## 2019-07-11 DIAGNOSIS — R197 Diarrhea, unspecified: Secondary | ICD-10-CM

## 2019-07-11 NOTE — Progress Notes (Signed)
HISTORY OF PRESENT ILLNESS:  Cindy Velasquez is a 78 y.o. female presents today via telehealth medicine during the coronavirus pandemic for follow-up of chronic postprandial diarrhea.  She was last evaluated June 03, 2019.  See that dictation for details.  Previous colonoscopy with random biopsies was unremarkable.  Testing for celiac disease was negative.  Stool fecal elastase was normal.  She had been treated empirically with Colestid which improved her situation though incompletely.  She was prescribed a 10-day course of metronidazole which she completed.  After completing her course of antibiotics her problems with diarrhea promptly resolved.  She is no longer requiring Colestid for the other medication for diarrhea.  She describes her bowel habits as back to normal.  No new problems or issues.  She does have questions regarding potential causes and why metronidazole may have been helpful.  REVIEW OF SYSTEMS:  All non-GI ROS negative unless otherwise stated in the HPI except for arthritis  Past Medical History:  Diagnosis Date  . Adenomatous colon polyp   . Cancer ([place]) 03/2015   BCC R tibia   . Diverticulosis of colon   . GERD (gastroesophageal reflux disease)   . Hip pain   . Hyperlipidemia   . Hyperplastic colon polyp   . Hypertension   . Lactose intolerance   . Loose stools   . Lower back pain   . Osteoporosis   . Palpitations     Past Surgical History:  Procedure Laterality Date  . EYE SURGERY  2012   B/L 01/2011  02/2011  . HAMMER TOE SURGERY     Bilateral  . ROTATOR CUFF REPAIR     Left  . TUBAL LIGATION      Social History Cindy Velasquez  reports that she quit smoking about 28 years ago. Her smoking use included cigarettes. She has a 15.00 pack-year smoking history. She has never used smokeless tobacco. She reports current alcohol use of about 5.0 standard drinks of alcohol per week. She reports that she does not use drugs.  family history includes Cancer (age of onset:  20) in her mother; Heart disease in her brother and father; Hyperlipidemia in her mother; Hypertension in her brother, brother, father, and mother; Obesity in her mother; Stroke in her father; Testicular cancer (age of onset: 4) in her brother.  No Known Allergies     PHYSICAL EXAMINATION: No physical exam with telehealth medicine visit  ASSESSMENT:  1.  1 year history of chronic postprandial diarrhea improved after course of metronidazole.  Suspect bacterial overgrowth versus occult infection such as giardiasis.  Evaluation for other etiologies as discussed was negative   PLAN:  1.  At this point patient will be followed expectantly.  She will return to the care of her primary provider.  She knows to contact this office for questions or problems. This telehealth audio only visit was initiated by consented for by the patient.  She was in her home and I was in my office.  She understands her may be an associated professional charge for this service which totaled 15 minutes.

## 2019-07-14 ENCOUNTER — Ambulatory Visit (INDEPENDENT_AMBULATORY_CARE_PROVIDER_SITE_OTHER): Payer: Medicare HMO | Admitting: Bariatrics

## 2019-07-14 ENCOUNTER — Other Ambulatory Visit: Payer: Self-pay

## 2019-07-14 ENCOUNTER — Encounter (INDEPENDENT_AMBULATORY_CARE_PROVIDER_SITE_OTHER): Payer: Self-pay | Admitting: Bariatrics

## 2019-07-14 DIAGNOSIS — Z6830 Body mass index (BMI) 30.0-30.9, adult: Secondary | ICD-10-CM

## 2019-07-14 DIAGNOSIS — I10 Essential (primary) hypertension: Secondary | ICD-10-CM

## 2019-07-14 DIAGNOSIS — E669 Obesity, unspecified: Secondary | ICD-10-CM

## 2019-07-14 DIAGNOSIS — E785 Hyperlipidemia, unspecified: Secondary | ICD-10-CM | POA: Diagnosis not present

## 2019-07-18 ENCOUNTER — Encounter (INDEPENDENT_AMBULATORY_CARE_PROVIDER_SITE_OTHER): Payer: Self-pay | Admitting: Bariatrics

## 2019-07-18 ENCOUNTER — Other Ambulatory Visit: Payer: Self-pay | Admitting: *Deleted

## 2019-07-18 DIAGNOSIS — I10 Essential (primary) hypertension: Secondary | ICD-10-CM

## 2019-07-18 MED ORDER — LISINOPRIL 20 MG PO TABS: 20.0000 mg | ORAL_TABLET | Freq: Every day | ORAL | 1 refills | Status: DC

## 2019-07-18 NOTE — Progress Notes (Signed)
Office: [phone]  /  Fax: [phone] TeleHealth Visit:  Cindy Velasquez has verbally consented to this TeleHealth visit today. The patient is located at home, the provider is located at the [facility] office. The participants in this visit include the listed provider and patient. The visit was conducted today via FaceTime.  HPI:   Chief Complaint: OBESITY Cindy Velasquez is here to discuss her progress with her obesity treatment plan. She is on the Category 2 plan and is following her eating plan approximately 75-80% of the time. She states she is walking 2-3 miles 7 times per week and doing chair yoga 20 minutes 2 times per week. Cindy Velasquez states that she has gained 1 lb. She also states that her diarrhea problem has resolved after 1 year (antibiotics for 10 days). She normally sees Abby Potash, PA-C. We were unable to weigh the patient today for this TeleHealth visit. She feels as if she has gained 1 lb since her last visit. She has lost 14 lbs since starting treatment with [place].  Hypertension Cindy Velasquez is a 78 y.o. female with hypertension and is taking HCTZ and lisinopril.  Cindy Velasquez denies chest pain or shortness of breath on exertion. She is working weight loss to help control her blood pressure with the goal of decreasing her risk of heart attack and stroke. Cindy Velasquez's blood pressure is well controlled at 130's/70's.  Hyperlipidemia Cindy Velasquez has hyperlipidemia and has been trying to improve her cholesterol levels with intensive lifestyle modification including a low saturated fat diet, exercise and weight loss. She is taking Crestor and denies any myalgias.  ASSESSMENT AND PLAN:  Essential hypertension  Hyperlipidemia LDL goal <100  Class 1 obesity with serious comorbidity and body mass index (BMI) of 30.0 to 30.9 in adult, unspecified obesity type  PLAN:  Hypertension We discussed sodium restriction, working on healthy weight loss, and a regular exercise program  as the means to achieve improved blood pressure control. Cindy Velasquez agreed with this plan and agreed to follow up as directed. We will continue to monitor her blood pressure as well as her progress with the above lifestyle modifications. She will continue her medications as prescribed and will watch for signs of hypotension as she continues her lifestyle modifications.  Hyperlipidemia Cindy Velasquez was informed of the American Heart Association Guidelines emphasizing intensive lifestyle modifications as the first line treatment for hyperlipidemia. We discussed many lifestyle modifications today in depth, and Cindy Velasquez will continue to work on decreasing saturated fats such as fatty red meat, butter and many fried foods. She will continue Crestor, increase vegetables and lean protein in her diet, and continue to work on exercise and weight loss efforts.  Obesity Cindy Velasquez is currently in the action stage of change. As such, her goal is to continue with weight loss efforts. She has agreed to follow the Category 2 plan. Cindy Velasquez will work on meal planning and intentional eating. She will resume Category 2 and will weigh hereself. Cindy Velasquez has been instructed to work up to a goal of 150 minutes of combined cardio and strengthening exercise per week for weight loss and overall health benefits. We discussed the following Behavioral Modification Stratagies today: increasing lean protein intake, decreasing simple carbohydrates, increasing vegetables, increase H20 intake, decrease eating out, no skipping meals, work on meal planning and easy cooking plans, and keeping healthy foods in the home.  Cindy Velasquez has agreed to follow-up with our clinic in 2 weeks. She was informed of the importance of frequent follow-up  visits to maximize her success with intensive lifestyle modifications for her multiple health conditions.  ALLERGIES: No Known Allergies  MEDICATIONS: Current Outpatient Medications on File Prior to Visit  Medication Sig  Dispense Refill  . Cholecalciferol (VITAMIN D3) 125 MCG (5000 UT) CAPS Take 1 capsule (5,000 Units total) by mouth daily. 30 capsule 0  . colestipol (COLESTID) 1 g tablet Take 1 tablet (1 g total) by mouth 2 (two) times daily. 60 tablet 3  . diphenhydramine-acetaminophen (TYLENOL PM) 25-500 MG TABS tablet Take 1 tablet by mouth at bedtime.    . hydrochlorothiazide (HYDRODIURIL) 25 MG tablet TAKE 1/2 TABLET BY MOUTH EVERY MORNING 45 tablet 1  . lisinopril (PRINIVIL,ZESTRIL) 20 MG tablet Take 1 tablet (20 mg total) by mouth daily. 90 tablet 0  . metroNIDAZOLE (FLAGYL) 250 MG tablet Take 1 tablet (250 mg total) by mouth 3 (three) times daily. 30 tablet 0  . Multiple Vitamins-Minerals (ICAPS AREDS 2) CAPS Take 2 capsules by mouth daily.     . rosuvastatin (CRESTOR) 20 MG tablet TAKE 1 TABLET BY MOUTH EVERY DAY 90 tablet 1   No current facility-administered medications on file prior to visit.     PAST MEDICAL HISTORY: Past Medical History:  Diagnosis Date  . Adenomatous colon polyp   . Cancer ([place]) 03/2015   BCC R tibia   . Diverticulosis of colon   . GERD (gastroesophageal reflux disease)   . Hip pain   . Hyperlipidemia   . Hyperplastic colon polyp   . Hypertension   . Lactose intolerance   . Loose stools   . Lower back pain   . Osteoporosis   . Palpitations     PAST SURGICAL HISTORY: Past Surgical History:  Procedure Laterality Date  . EYE SURGERY  2012   B/L 01/2011  02/2011  . HAMMER TOE SURGERY     Bilateral  . ROTATOR CUFF REPAIR     Left  . TUBAL LIGATION      SOCIAL HISTORY: Social History   Tobacco Use  . Smoking status: Former Smoker    Packs/day: 0.50    Years: 30.00    Pack years: 15.00    Types: Cigarettes    Quit date: 06/02/1991    Years since quitting: 28.1  . Smokeless tobacco: Never Used  Substance Use Topics  . Alcohol use: Yes    Alcohol/week: 5.0 standard drinks    Types: 5 Glasses of wine per week  . Drug use: No    FAMILY HISTORY: Family  History  Problem Relation Age of Onset  . Hypertension Mother   . Cancer Mother 36       ?  Marland Kitchen Hyperlipidemia Mother   . Obesity Mother   . Hypertension Father   . Stroke Father   . Heart disease Father   . Hypertension Brother   . Testicular cancer Brother 20  . Hypertension Brother   . Heart disease Brother        quadruple bypass  . Colon cancer Neg Hx   . Stomach cancer Neg Hx   . Throat cancer Neg Hx    ROS: Review of Systems  Musculoskeletal: Negative for myalgias.   PHYSICAL EXAM: Pt in no acute distress  RECENT LABS AND TESTS: BMET    Component Value Date/Time   NA 143 03/07/2019 0857   NA 143 12/09/2018 1043   K 3.5 03/07/2019 0857   CL 102 03/07/2019 0857   CO2 33 (H) 03/07/2019 0857   GLUCOSE 88  03/07/2019 0857   BUN 20 03/07/2019 0857   BUN 15 12/09/2018 1043   CREATININE 0.63 03/07/2019 0857   CREATININE 0.63 09/03/2018 1452   CALCIUM 9.3 03/07/2019 0857   GFRNONAA 76 12/09/2018 1043   GFRAA 88 12/09/2018 1043   Lab Results  Component Value Date   HGBA1C 5.7 (H) 03/30/2019   HGBA1C 5.7 (H) 12/09/2018   HGBA1C 5.8 (H) 09/03/2018   Lab Results  Component Value Date   INSULIN 12.2 03/30/2019   INSULIN 7.3 12/09/2018   CBC    Component Value Date/Time   WBC 6.5 03/07/2019 0857   RBC 4.51 03/07/2019 0857   HGB 13.6 03/07/2019 0857   HCT 40.6 03/07/2019 0857   PLT 270.0 03/07/2019 0857   MCV 89.9 03/07/2019 0857   MCH 29.2 09/03/2018 1452   MCHC 33.6 03/07/2019 0857   RDW 14.7 03/07/2019 0857   LYMPHSABS 2.1 03/07/2019 0857   MONOABS 0.7 03/07/2019 0857   EOSABS 0.3 03/07/2019 0857   BASOSABS 0.1 03/07/2019 0857   Iron/TIBC/Ferritin/ %Sat No results found for: IRON, TIBC, FERRITIN, IRONPCTSAT Lipid Panel     Component Value Date/Time   CHOL 141 03/07/2019 0857   CHOL 165 12/09/2018 1043   TRIG 133.0 03/07/2019 0857   HDL 38.30 (L) 03/07/2019 0857   HDL 41 12/09/2018 1043   CHOLHDL 4 03/07/2019 0857   VLDL 26.6 03/07/2019 0857    LDLCALC 76 03/07/2019 0857   LDLCALC 90 12/09/2018 1043   LDLCALC 106 (H) 09/03/2018 1452   LDLDIRECT 129.1 08/23/2007 0913   Hepatic Function Panel     Component Value Date/Time   PROT 6.7 03/07/2019 0857   PROT 7.5 12/09/2018 1043   ALBUMIN 3.8 03/07/2019 0857   ALBUMIN 4.4 12/09/2018 1043   AST 22 03/07/2019 0857   ALT 23 03/07/2019 0857   ALKPHOS 84 03/07/2019 0857   BILITOT 0.5 03/07/2019 0857   BILITOT 0.5 12/09/2018 1043   BILIDIR 0.1 02/19/2015 0847      Component Value Date/Time   TSH 1.75 09/03/2018 1452   TSH 1.95 06/17/2018 1722   TSH 1.71 07/02/2017 1109   Results for TAYTEN, HEBER A (MRN [id number]) as of 07/18/2019 12:18  Ref. Range 03/30/2019 09:47  Vitamin D, 25-Hydroxy Latest Ref Range: 30.0 - 100.0 ng/mL 50.2   I, Michaelene Song, am acting as Location manager for [employer], DO  I have reviewed the above documentation for accuracy and completeness, and I agree with the above. -Jearld Lesch, DO

## 2019-07-28 ENCOUNTER — Ambulatory Visit (INDEPENDENT_AMBULATORY_CARE_PROVIDER_SITE_OTHER): Payer: Medicare HMO | Admitting: Family Medicine

## 2019-08-02 ENCOUNTER — Encounter (INDEPENDENT_AMBULATORY_CARE_PROVIDER_SITE_OTHER): Payer: Self-pay | Admitting: Physician Assistant

## 2019-08-02 ENCOUNTER — Other Ambulatory Visit: Payer: Self-pay

## 2019-08-02 ENCOUNTER — Ambulatory Visit (INDEPENDENT_AMBULATORY_CARE_PROVIDER_SITE_OTHER): Payer: Medicare HMO | Admitting: Physician Assistant

## 2019-08-02 VITALS — BP 110/69 | HR 72 | Temp 98.3°F | Ht 60.0 in | Wt 139.0 lb

## 2019-08-02 DIAGNOSIS — E559 Vitamin D deficiency, unspecified: Secondary | ICD-10-CM | POA: Diagnosis not present

## 2019-08-02 DIAGNOSIS — E669 Obesity, unspecified: Secondary | ICD-10-CM

## 2019-08-02 DIAGNOSIS — Z6830 Body mass index (BMI) 30.0-30.9, adult: Secondary | ICD-10-CM

## 2019-08-03 NOTE — Progress Notes (Signed)
Office: [phone]  /  Fax: [phone]   HPI:   Chief Complaint: OBESITY Cindy Velasquez is here to discuss her progress with her obesity treatment plan. She is on the Category 2 plan and is following her eating plan approximately 90 % of the time. She states she is walking 2 to 3 miles 7 times per week. Cindy Velasquez reports that she is slightly bored with lunch and dinner, and she is looking for other options. She is almost at her maintenance weight. Her weight is 139 lb (63 kg) today and has had a weight loss of 5 pounds since her last in-office visit. She has lost 19 lbs since starting treatment with [place].  Vitamin D deficiency Cindy Velasquez has a diagnosis of vitamin D deficiency. She is currently taking vit D and denies nausea, vomiting or muscle weakness.  ASSESSMENT AND PLAN:  Vitamin D deficiency  Class 1 obesity with serious comorbidity and body mass index (BMI) of 30.0 to 30.9 in adult, unspecified obesity type - BMI greater than 30 at start of progrm  PLAN:  Vitamin D Deficiency Cindy Velasquez was informed that low vitamin D levels contributes to fatigue and are associated with obesity, breast, and colon cancer. She agrees to continue to taking OTC vitamin D and will follow up for routine testing of vitamin D, at least 2-3 times per year. She was informed of the risk of over-replacement of vitamin D and agrees to not increase her dose unless she discusses this with [place] first.  Obesity Cindy Velasquez is currently in the action stage of change. As such, her goal is to continue with weight loss efforts She has agreed to change to the [place] has been instructed to work up to a goal of 150 minutes of combined cardio and strengthening exercise per week for weight loss and overall health benefits. We discussed the following Behavioral Modification Strategies today: keeping healthy foods in the home and work on meal planning and easy cooking plans  Cindy Velasquez has agreed to follow up with our  clinic in 2 weeks. She was informed of the importance of frequent follow up visits to maximize her success with intensive lifestyle modifications for her multiple health conditions.  ALLERGIES: No Known Allergies  MEDICATIONS: Current Outpatient Medications on File Prior to Visit  Medication Sig Dispense Refill   Cholecalciferol (VITAMIN D3) 125 MCG (5000 UT) CAPS Take 1 capsule (5,000 Units total) by mouth daily. 30 capsule 0   colestipol (COLESTID) 1 g tablet Take 1 tablet (1 g total) by mouth 2 (two) times daily. 60 tablet 3   diphenhydramine-acetaminophen (TYLENOL PM) 25-500 MG TABS tablet Take 1 tablet by mouth at bedtime.     hydrochlorothiazide (HYDRODIURIL) 25 MG tablet TAKE 1/2 TABLET BY MOUTH EVERY MORNING 45 tablet 1   lisinopril (ZESTRIL) 20 MG tablet Take 1 tablet (20 mg total) by mouth daily. 90 tablet 1   metroNIDAZOLE (FLAGYL) 250 MG tablet Take 1 tablet (250 mg total) by mouth 3 (three) times daily. 30 tablet 0   Multiple Vitamins-Minerals (ICAPS AREDS 2) CAPS Take 2 capsules by mouth daily.      rosuvastatin (CRESTOR) 20 MG tablet TAKE 1 TABLET BY MOUTH EVERY DAY 90 tablet 1   No current facility-administered medications on file prior to visit.     PAST MEDICAL HISTORY: Past Medical History:  Diagnosis Date   Adenomatous colon polyp    Cancer ([place]) 03/2015   BCC R tibia    Diverticulosis of colon  GERD (gastroesophageal reflux disease)    Hip pain    Hyperlipidemia    Hyperplastic colon polyp    Hypertension    Lactose intolerance    Loose stools    Lower back pain    Osteoporosis    Palpitations     PAST SURGICAL HISTORY: Past Surgical History:  Procedure Laterality Date   EYE SURGERY  2012   B/L 01/2011  02/2011   HAMMER TOE SURGERY     Bilateral   ROTATOR CUFF REPAIR     Left   TUBAL LIGATION      SOCIAL HISTORY: Social History   Tobacco Use   Smoking status: Former Smoker    Packs/day: 0.50    Years: 30.00     Pack years: 15.00    Types: Cigarettes    Quit date: 06/02/1991    Years since quitting: 28.1   Smokeless tobacco: Never Used  Substance Use Topics   Alcohol use: Yes    Alcohol/week: 5.0 standard drinks    Types: 5 Glasses of wine per week   Drug use: No    FAMILY HISTORY: Family History  Problem Relation Age of Onset   Hypertension Mother    Cancer Mother 48       ?   Hyperlipidemia Mother    Obesity Mother    Hypertension Father    Stroke Father    Heart disease Father    Hypertension Brother    Testicular cancer Brother 34   Hypertension Brother    Heart disease Brother        quadruple bypass   Colon cancer Neg Hx    Stomach cancer Neg Hx    Throat cancer Neg Hx     ROS: Review of Systems  Constitutional: Positive for weight loss.  Gastrointestinal: Negative for nausea and vomiting.  Musculoskeletal:       Negative for muscle weakness    PHYSICAL EXAM: Blood pressure 110/69, pulse 72, temperature 98.3 F (36.8 C), temperature source Oral, height 5' (1.524 m), weight 139 lb (63 kg), SpO2 96 %. Body mass index is 27.15 kg/m. Physical Exam Vitals signs reviewed.  Constitutional:      Appearance: Normal appearance. She is well-developed. She is obese.  Cardiovascular:     Rate and Rhythm: Normal rate.  Pulmonary:     Effort: Pulmonary effort is normal.  Musculoskeletal: Normal range of motion.  Skin:    General: Skin is warm and dry.  Neurological:     Mental Status: She is alert and oriented to person, place, and time.  Psychiatric:        Mood and Affect: Mood normal.        Behavior: Behavior normal.     RECENT LABS AND TESTS: BMET    Component Value Date/Time   NA 143 03/07/2019 0857   NA 143 12/09/2018 1043   K 3.5 03/07/2019 0857   CL 102 03/07/2019 0857   CO2 33 (H) 03/07/2019 0857   GLUCOSE 88 03/07/2019 0857   BUN 20 03/07/2019 0857   BUN 15 12/09/2018 1043   CREATININE 0.63 03/07/2019 0857   CREATININE 0.63  09/03/2018 1452   CALCIUM 9.3 03/07/2019 0857   GFRNONAA 76 12/09/2018 1043   GFRAA 88 12/09/2018 1043   Lab Results  Component Value Date   HGBA1C 5.7 (H) 03/30/2019   HGBA1C 5.7 (H) 12/09/2018   HGBA1C 5.8 (H) 09/03/2018   Lab Results  Component Value Date   INSULIN 12.2 03/30/2019  INSULIN 7.3 12/09/2018   CBC    Component Value Date/Time   WBC 6.5 03/07/2019 0857   RBC 4.51 03/07/2019 0857   HGB 13.6 03/07/2019 0857   HCT 40.6 03/07/2019 0857   PLT 270.0 03/07/2019 0857   MCV 89.9 03/07/2019 0857   MCH 29.2 09/03/2018 1452   MCHC 33.6 03/07/2019 0857   RDW 14.7 03/07/2019 0857   LYMPHSABS 2.1 03/07/2019 0857   MONOABS 0.7 03/07/2019 0857   EOSABS 0.3 03/07/2019 0857   BASOSABS 0.1 03/07/2019 0857   Iron/TIBC/Ferritin/ %Sat No results found for: IRON, TIBC, FERRITIN, IRONPCTSAT Lipid Panel     Component Value Date/Time   CHOL 141 03/07/2019 0857   CHOL 165 12/09/2018 1043   TRIG 133.0 03/07/2019 0857   HDL 38.30 (L) 03/07/2019 0857   HDL 41 12/09/2018 1043   CHOLHDL 4 03/07/2019 0857   VLDL 26.6 03/07/2019 0857   LDLCALC 76 03/07/2019 0857   LDLCALC 90 12/09/2018 1043   LDLCALC 106 (H) 09/03/2018 1452   LDLDIRECT 129.1 08/23/2007 0913   Hepatic Function Panel     Component Value Date/Time   PROT 6.7 03/07/2019 0857   PROT 7.5 12/09/2018 1043   ALBUMIN 3.8 03/07/2019 0857   ALBUMIN 4.4 12/09/2018 1043   AST 22 03/07/2019 0857   ALT 23 03/07/2019 0857   ALKPHOS 84 03/07/2019 0857   BILITOT 0.5 03/07/2019 0857   BILITOT 0.5 12/09/2018 1043   BILIDIR 0.1 02/19/2015 0847      Component Value Date/Time   TSH 1.75 09/03/2018 1452   TSH 1.95 06/17/2018 1722   TSH 1.71 07/02/2017 1109     Ref. Range 03/30/2019 09:47  Vitamin D, 25-Hydroxy Latest Ref Range: 30.0 - 100.0 ng/mL 50.2    OBESITY BEHAVIORAL INTERVENTION VISIT  Today's visit was # 20   Starting weight: 158 lbs Starting date: 09/06/2018 Today's weight : 139 lbs  Today's date:  08/02/2019 Total lbs lost to date: 19    08/02/2019  Height 5' (1.524 m)  Weight 139 lb (63 kg)  BMI (Calculated) 27.15  BLOOD PRESSURE - SYSTOLIC [id number]  BLOOD PRESSURE - DIASTOLIC 69   Body Fat % 0000000 %  Total Body Water (lbs) 57.6 lbs    ASK: We discussed the diagnosis of obesity with Cindy Velasquez today and Cindy Velasquez agreed to give [place] permission to discuss obesity behavioral modification therapy today.  ASSESS: Cindy Velasquez has the diagnosis of obesity and her BMI today is 27.15 Cindy Velasquez is in the action stage of change   ADVISE: Cindy Velasquez was educated on the multiple health risks of obesity as well as the benefit of weight loss to improve her health. She was advised of the need for long term treatment and the importance of lifestyle modifications to improve her current health and to decrease her risk of future health problems.  AGREE: Multiple dietary modification options and treatment options were discussed and  Cindy Velasquez agreed to follow the recommendations documented in the above note.  ARRANGE: Cindy Velasquez was educated on the importance of frequent visits to treat obesity as outlined per CMS and USPSTF guidelines and agreed to schedule her next follow up appointment today.  Corey Skains, am acting as transcriptionist for Abby Potash, PA-C I, Abby Potash, PA-C have reviewed above note and agree with its content

## 2019-08-09 ENCOUNTER — Other Ambulatory Visit: Payer: Self-pay | Admitting: Family Medicine

## 2019-08-16 ENCOUNTER — Other Ambulatory Visit: Payer: Self-pay

## 2019-08-16 ENCOUNTER — Ambulatory Visit (INDEPENDENT_AMBULATORY_CARE_PROVIDER_SITE_OTHER): Payer: Medicare HMO | Admitting: Physician Assistant

## 2019-08-16 VITALS — BP 114/69 | HR 68 | Temp 98.2°F | Ht 60.0 in | Wt 139.2 lb

## 2019-08-16 DIAGNOSIS — E669 Obesity, unspecified: Secondary | ICD-10-CM

## 2019-08-16 DIAGNOSIS — Z6830 Body mass index (BMI) 30.0-30.9, adult: Secondary | ICD-10-CM | POA: Diagnosis not present

## 2019-08-16 DIAGNOSIS — E7849 Other hyperlipidemia: Secondary | ICD-10-CM

## 2019-08-17 NOTE — Progress Notes (Signed)
Office: [phone]  /  Fax: [phone]   HPI:   TIME: 25 minutes  Chief Complaint: OBESITY Cindy Velasquez is here to discuss her progress with her obesity treatment plan. She is on the Category 2 plan and is following her eating plan approximately 85% of the time. She states she is exercising 0 minutes 0 times per week. Cindy Velasquez reports that her diarrhea has returned. She spoke with her GI doctor who asked her to restart her Colestid. She reports she is continuing to eat on the plan. Her weight is 139 lb 3.2 oz (63.1 kg) today and has not lost weight since her last visit. She has lost 19 lbs since starting treatment with [place].  Hyperlipidemia Cindy Velasquez has hyperlipidemia and has been trying to improve her cholesterol levels with intensive lifestyle modification including a low saturated fat diet, exercise and weight loss. She is on Crestor and denies any chest pain.  ASSESSMENT AND PLAN:  Other hyperlipidemia  Class 1 obesity with serious comorbidity and body mass index (BMI) of 30.0 to 30.9 in adult, unspecified obesity type  PLAN:  Hyperlipidemia Cindy Velasquez was informed of the American Heart Association Guidelines emphasizing intensive lifestyle modifications as the first line treatment for hyperlipidemia. We discussed many lifestyle modifications today in depth, and Cindy Velasquez will continue to work on decreasing saturated fats such as fatty red meat, butter and many fried foods. She will continue Crestor, increase vegetables and lean protein in her diet, and continue to work on exercise and weight loss efforts.  Obesity Cindy Velasquez is currently in the action stage of change. As such, her goal is to continue with weight loss efforts. She has agreed to follow the Category 2 plan. Cindy Velasquez has been instructed to work up to a goal of 150 minutes of combined cardio and strengthening exercise per week for weight loss and overall health benefits. We discussed the following Behavioral Modification Strategies  today: work on meal planning and easy cooking plans, and planning for success.  Cindy Velasquez has agreed to follow-up with our clinic in 2-3 weeks. She was informed of the importance of frequent follow-up visits to maximize her success with intensive lifestyle modifications for her multiple health conditions.  I spent > than 50% of the 25 minute visit on counseling as documented in the note.    ALLERGIES: No Known Allergies  MEDICATIONS: Current Outpatient Medications on File Prior to Visit  Medication Sig Dispense Refill   Cholecalciferol (VITAMIN D3) 125 MCG (5000 UT) CAPS Take 1 capsule (5,000 Units total) by mouth daily. 30 capsule 0   colestipol (COLESTID) 1 g tablet Take 1 tablet (1 g total) by mouth 2 (two) times daily. 60 tablet 3   diphenhydramine-acetaminophen (TYLENOL PM) 25-500 MG TABS tablet Take 1 tablet by mouth at bedtime.     hydrochlorothiazide (HYDRODIURIL) 25 MG tablet TAKE 1/2 TABLET BY MOUTH EVERY MORNING 45 tablet 1   lisinopril (ZESTRIL) 20 MG tablet Take 1 tablet (20 mg total) by mouth daily. 90 tablet 1   Multiple Vitamins-Minerals (ICAPS AREDS 2) CAPS Take 2 capsules by mouth daily.      rosuvastatin (CRESTOR) 20 MG tablet TAKE 1 TABLET BY MOUTH EVERY DAY 90 tablet 1   No current facility-administered medications on file prior to visit.     PAST MEDICAL HISTORY: Past Medical History:  Diagnosis Date   Adenomatous colon polyp    Cancer ([place]) 03/2015   BCC R tibia    Diverticulosis of colon    GERD (gastroesophageal reflux disease)  Hip pain    Hyperlipidemia    Hyperplastic colon polyp    Hypertension    Lactose intolerance    Loose stools    Lower back pain    Osteoporosis    Palpitations     PAST SURGICAL HISTORY: Past Surgical History:  Procedure Laterality Date   EYE SURGERY  2012   B/L 01/2011  02/2011   HAMMER TOE SURGERY     Bilateral   ROTATOR CUFF REPAIR     Left   TUBAL LIGATION      SOCIAL HISTORY: Social  History   Tobacco Use   Smoking status: Former Smoker    Packs/day: 0.50    Years: 30.00    Pack years: 15.00    Types: Cigarettes    Quit date: 06/02/1991    Years since quitting: 28.2   Smokeless tobacco: Never Used  Substance Use Topics   Alcohol use: Yes    Alcohol/week: 5.0 standard drinks    Types: 5 Glasses of wine per week   Drug use: No    FAMILY HISTORY: Family History  Problem Relation Age of Onset   Hypertension Mother    Cancer Mother 3       ?   Hyperlipidemia Mother    Obesity Mother    Hypertension Father    Stroke Father    Heart disease Father    Hypertension Brother    Testicular cancer Brother 37   Hypertension Brother    Heart disease Brother        quadruple bypass   Colon cancer Neg Hx    Stomach cancer Neg Hx    Throat cancer Neg Hx    ROS: Review of Systems  Cardiovascular: Negative for chest pain.   PHYSICAL EXAM: Blood pressure 114/69, pulse 68, temperature 98.2 F (36.8 C), temperature source Oral, height 5' (1.524 m), weight 139 lb 3.2 oz (63.1 kg), SpO2 96 %. Body mass index is 27.19 kg/m. Physical Exam Vitals signs reviewed.  Constitutional:      Appearance: Normal appearance. She is obese.  Cardiovascular:     Rate and Rhythm: Normal rate.     Pulses: Normal pulses.  Pulmonary:     Effort: Pulmonary effort is normal.     Breath sounds: Normal breath sounds.  Musculoskeletal: Normal range of motion.  Skin:    General: Skin is warm and dry.  Neurological:     Mental Status: She is alert and oriented to person, place, and time.  Psychiatric:        Behavior: Behavior normal.   RECENT LABS AND TESTS: BMET    Component Value Date/Time   NA 143 03/07/2019 0857   NA 143 12/09/2018 1043   K 3.5 03/07/2019 0857   CL 102 03/07/2019 0857   CO2 33 (H) 03/07/2019 0857   GLUCOSE 88 03/07/2019 0857   BUN 20 03/07/2019 0857   BUN 15 12/09/2018 1043   CREATININE 0.63 03/07/2019 0857   CREATININE 0.63  09/03/2018 1452   CALCIUM 9.3 03/07/2019 0857   GFRNONAA 76 12/09/2018 1043   GFRAA 88 12/09/2018 1043   Lab Results  Component Value Date   HGBA1C 5.7 (H) 03/30/2019   HGBA1C 5.7 (H) 12/09/2018   HGBA1C 5.8 (H) 09/03/2018   Lab Results  Component Value Date   INSULIN 12.2 03/30/2019   INSULIN 7.3 12/09/2018   CBC    Component Value Date/Time   WBC 6.5 03/07/2019 0857   RBC 4.51 03/07/2019 0857  HGB 13.6 03/07/2019 0857   HCT 40.6 03/07/2019 0857   PLT 270.0 03/07/2019 0857   MCV 89.9 03/07/2019 0857   MCH 29.2 09/03/2018 1452   MCHC 33.6 03/07/2019 0857   RDW 14.7 03/07/2019 0857   LYMPHSABS 2.1 03/07/2019 0857   MONOABS 0.7 03/07/2019 0857   EOSABS 0.3 03/07/2019 0857   BASOSABS 0.1 03/07/2019 0857   Iron/TIBC/Ferritin/ %Sat No results found for: IRON, TIBC, FERRITIN, IRONPCTSAT Lipid Panel     Component Value Date/Time   CHOL 141 03/07/2019 0857   CHOL 165 12/09/2018 1043   TRIG 133.0 03/07/2019 0857   HDL 38.30 (L) 03/07/2019 0857   HDL 41 12/09/2018 1043   CHOLHDL 4 03/07/2019 0857   VLDL 26.6 03/07/2019 0857   LDLCALC 76 03/07/2019 0857   LDLCALC 90 12/09/2018 1043   LDLCALC 106 (H) 09/03/2018 1452   LDLDIRECT 129.1 08/23/2007 0913   Hepatic Function Panel     Component Value Date/Time   PROT 6.7 03/07/2019 0857   PROT 7.5 12/09/2018 1043   ALBUMIN 3.8 03/07/2019 0857   ALBUMIN 4.4 12/09/2018 1043   AST 22 03/07/2019 0857   ALT 23 03/07/2019 0857   ALKPHOS 84 03/07/2019 0857   BILITOT 0.5 03/07/2019 0857   BILITOT 0.5 12/09/2018 1043   BILIDIR 0.1 02/19/2015 0847      Component Value Date/Time   TSH 1.75 09/03/2018 1452   TSH 1.95 06/17/2018 1722   TSH 1.71 07/02/2017 1109   Results for BREONCA, ANNETT A (MRN [id number]) as of 08/17/2019 08:45  Ref. Range 03/30/2019 09:47  Vitamin D, 25-Hydroxy Latest Ref Range: 30.0 - 100.0 ng/mL 50.2   OBESITY BEHAVIORAL INTERVENTION VISIT  Today's visit was #21   Starting weight: 158 lbs Starting date:  09/06/2018 Today's weight: 139 lbs Today's date: 08/16/2019 Total lbs lost to date: 19 At least 15 minutes were spent on discussing the following behavioral intervention visit.    08/16/2019  Height 5' (1.524 m)  Weight 139 lb 3.2 oz (63.1 kg)  BMI (Calculated) 27.19  BLOOD PRESSURE - SYSTOLIC 99991111  BLOOD PRESSURE - DIASTOLIC 69   Body Fat % 41 %  Total Body Water (lbs) 54.4 lbs   ASK: We discussed the diagnosis of obesity with Cindy Velasquez today and Cindy Velasquez agreed to give [place] permission to discuss obesity behavioral modification therapy today.  ASSESS: Cindy Velasquez has the diagnosis of obesity and her BMI today is 27.2. Zlaty is in the action stage of change.   ADVISE: Cindy Velasquez was educated on the multiple health risks of obesity as well as the benefit of weight loss to improve her health. She was advised of the need for long term treatment and the importance of lifestyle modifications to improve her current health and to decrease her risk of future health problems.  AGREE: Multiple dietary modification options and treatment options were discussed and  Rikesha agreed to follow the recommendations documented in the above note.  ARRANGE: Ashyla was educated on the importance of frequent visits to treat obesity as outlined per CMS and USPSTF guidelines and agreed to schedule her next follow up appointment today.  Migdalia Dk, am acting as transcriptionist for Abby Potash, PA-C I, Abby Potash, PA-C have reviewed above note and agree with its content

## 2019-09-02 DIAGNOSIS — Z20828 Contact with and (suspected) exposure to other viral communicable diseases: Secondary | ICD-10-CM | POA: Diagnosis not present

## 2019-09-05 ENCOUNTER — Ambulatory Visit (INDEPENDENT_AMBULATORY_CARE_PROVIDER_SITE_OTHER): Payer: Medicare HMO | Admitting: Physician Assistant

## 2019-09-05 ENCOUNTER — Telehealth: Payer: Self-pay | Admitting: Internal Medicine

## 2019-09-06 ENCOUNTER — Ambulatory Visit (INDEPENDENT_AMBULATORY_CARE_PROVIDER_SITE_OTHER): Payer: Medicare HMO | Admitting: Physician Assistant

## 2019-09-07 NOTE — Telephone Encounter (Signed)
Prior authorization for Linzess approved

## 2019-09-09 NOTE — Telephone Encounter (Signed)
Prior authorization for Colestipol initiated.

## 2019-09-13 ENCOUNTER — Other Ambulatory Visit: Payer: Self-pay | Admitting: Internal Medicine

## 2019-09-14 NOTE — Progress Notes (Signed)
Subjective:   Cindy Velasquez is a 78 y.o. female who presents for Medicare Annual (Subsequent) preventive examination.  Review of Systems:    Home Safety/Smoke Alarms: Feels safe in home. Smoke alarms in place.  Lives w/ husband in 1 story home.    Female:    Mammo- ordered        Dexa scan- ordered        [facility]- 12/16/18 Eye- Dr.Graot every 6 months. UTD per pt.    Objective:     Vitals: BP 132/62 (BP Location: Left Arm, Patient Position: Sitting, Cuff Size: Normal)   Pulse 70   Temp 97.7 F (36.5 C) (Temporal)   Ht 5' (1.524 m)   Wt 143 lb 6.4 oz (65 kg)   SpO2 97%   BMI 28.01 kg/m   Body mass index is 28.01 kg/m.  Advanced Directives 09/15/2019 09/03/2018 09/03/2017 08/05/2016  Does Patient Have a Medical Advance Directive? No;Yes Yes Yes Yes  Type of Paramedic of [place];Living will [place];Living will [place];Living will [place];Living will  Does patient want to make changes to medical advance directive? No - Patient declined - - -  Copy of [place] in Chart? No - copy requested No - copy requested No - copy requested No - copy requested    Tobacco Social History   Tobacco Use  Smoking Status Former Smoker  . Packs/day: 0.50  . Years: 30.00  . Pack years: 15.00  . Types: Cigarettes  . Quit date: 06/02/1991  . Years since quitting: 28.3  Smokeless Tobacco Never Used     Counseling given: Not Answered   Clinical Intake:     Pain : No/denies pain                 Past Medical History:  Diagnosis Date  . Adenomatous colon polyp   . Cancer ([place]) 03/2015   BCC R tibia   . Diverticulosis of colon   . GERD (gastroesophageal reflux disease)   . Hip pain   . Hyperlipidemia   . Hyperplastic colon polyp   . Hypertension   . Lactose intolerance   . Loose stools   . Lower back pain   . Osteoporosis   . Palpitations    Past Surgical History:   Procedure Laterality Date  . EYE SURGERY  2012   B/L 01/2011  02/2011  . HAMMER TOE SURGERY     Bilateral  . ROTATOR CUFF REPAIR     Left  . TUBAL LIGATION     Family History  Problem Relation Age of Onset  . Hypertension Mother   . Cancer Mother 72       ?  Marland Kitchen Hyperlipidemia Mother   . Obesity Mother   . Hypertension Father   . Stroke Father   . Heart disease Father   . Hypertension Brother   . Testicular cancer Brother 74  . Hypertension Brother   . Heart disease Brother        quadruple bypass  . Colon cancer Neg Hx   . Stomach cancer Neg Hx   . Throat cancer Neg Hx    Social History   Socioeconomic History  . Marital status: Married    Spouse name: Cindy Velasquez  . Number of children: 4  . Years of education: Not on file  . Highest education level: Not on file  Occupational History  . Occupation: retired--[employer]  Social Needs  . Financial resource strain: Not on file  . Food insecurity    Worry: Not on file    Inability: Not on file  . Transportation needs    Medical: Not on file    Non-medical: Not on file  Tobacco Use  . Smoking status: Former Smoker    Packs/day: 0.50    Years: 30.00    Pack years: 15.00    Types: Cigarettes    Quit date: 06/02/1991    Years since quitting: 28.3  . Smokeless tobacco: Never Used  Substance and Sexual Activity  . Alcohol use: Yes    Alcohol/week: 5.0 standard drinks    Types: 5 Glasses of wine per week  . Drug use: No  . Sexual activity: Not Currently    Partners: Male  Lifestyle  . Physical activity    Days per week: Not on file    Minutes per session: Not on file  . Stress: Not on file  Relationships  . Social Herbalist on phone: Not on file    Gets together: Not on file    Attends religious service: Not on file    Active member of club or organization: Not on file    Attends meetings of clubs or organizations: Not on file    Relationship status: Not on file  Other  Topics Concern  . Not on file  Social History Narrative   Exercise--  [employer]--yoga and cardio 3x a week    Outpatient Encounter Medications as of 09/15/2019  Medication Sig  . Cholecalciferol (VITAMIN D3) 125 MCG (5000 UT) CAPS Take 1 capsule (5,000 Units total) by mouth daily.  . diphenhydramine-acetaminophen (TYLENOL PM) 25-500 MG TABS tablet Take 1 tablet by mouth at bedtime.  . hydrochlorothiazide (HYDRODIURIL) 25 MG tablet TAKE 1/2 TABLET BY MOUTH EVERY MORNING  . lisinopril (ZESTRIL) 20 MG tablet Take 1 tablet (20 mg total) by mouth daily.  . Multiple Vitamins-Minerals (ICAPS AREDS 2) CAPS Take 2 capsules by mouth daily.   . rosuvastatin (CRESTOR) 20 MG tablet TAKE 1 TABLET BY MOUTH EVERY DAY  . colestipol (COLESTID) 1 g tablet TAKE 1 TABLET(1 GRAM) BY MOUTH TWICE DAILY (Patient taking differently: 2 g 2 (two) times daily. )   No facility-administered encounter medications on file as of 09/15/2019.     Activities of Daily Living In your present state of health, do you have any difficulty performing the following activities: 09/15/2019  Hearing? N  Vision? Y  Difficulty concentrating or making decisions? N  Walking or climbing stairs? N  Dressing or bathing? N  Doing errands, shopping? N  Preparing Food and eating ? N  Using the Toilet? N  In the past six months, have you accidently leaked urine? N  Do you have problems with loss of bowel control? N  Managing your Medications? N  Managing your Finances? N  Housekeeping or managing your Housekeeping? N  Some recent data might be hidden    Patient Care Team: Carollee Herter, Alferd Apa, DO as PCP - General Clent Jacks, MD as Consulting Physician (Ophthalmology)    Assessment:   This is a routine wellness examination for [place]. Physical assessment deferred to PCP.  Exercise Activities and Dietary recommendations Current Exercise Habits: Home exercise routine, Type of exercise: walking, Time (Minutes): 60, Frequency  (Times/Week): 5, Weekly Exercise (Minutes/Week): 300, Intensity: Mild, Exercise limited by: None identified Diet (meal preparation, eat out, water intake, caffeinated beverages, dairy products, fruits and vegetables): in  general, a "healthy" diet  , well balanced   Goals    . Increase physical activity    . Increase water intake       Fall Risk Fall Risk  09/15/2019 09/03/2018 09/03/2017 08/05/2016 02/07/2016  Falls in the past year? 0 No No No No  Number falls in past yr: 0 - - - -  Injury with Fall? 0 - - - -    Depression Screen PHQ 2/9 Scores 09/15/2019 09/06/2018 09/03/2018 09/03/2017  PHQ - 2 Score 0 2 0 0  PHQ- 9 Score - 7 - -     Cognitive Function Ad8 score reviewed for issues:  Issues making decisions:no  Less interest in hobbies / activities:no  Repeats questions, stories (family complaining):no  Trouble using ordinary gadgets (microwave, computer, phone):no  Forgets the month or year: no  Mismanaging finances: no  Remembering appts:no  Daily problems with thinking and/or memory:no Ad8 score is=0   MMSE - Mini Mental State Exam 08/05/2016  Orientation to time 5  Orientation to Place 5  Registration 3  Attention/ Calculation 5  Recall 3  Language- name 2 objects 2  Language- repeat 1  Language- follow 3 step command 3  Language- read & follow direction 1  Write a sentence 1  Copy design 0  Total score 29        Immunization History  Administered Date(s) Administered  . H1N1 12/28/2008  . Influenza Whole 10/20/2007, 10/18/2008, 11/15/2012  . Influenza, High Dose Seasonal PF 09/03/2018  . Influenza-Unspecified 12/20/2013, 11/16/2016  . Pneumococcal Conjugate-13 04/23/2015  . Pneumococcal Polysaccharide-23 12/28/2008  . Tdap 08/01/2013    Screening Tests Health Maintenance  Topic Date Due  . MAMMOGRAM  05/29/2018  . INFLUENZA VACCINE  07/09/2019  . TETANUS/TDAP  08/02/2023  . DEXA SCAN  Completed  . PNA vac Low Risk Adult  Completed        Plan:   See you next year!  Continue to eat heart healthy diet (full of fruits, vegetables, whole grains, lean protein, water--limit salt, fat, and sugar intake) and increase physical activity as tolerated.  Continue doing brain stimulating activities (puzzles, reading, adult coloring books, staying active) to keep memory sharp.   I have ordered you mammogram and bone density. Please schedule.  I have personally reviewed and noted the following in the patient's chart:   . Medical and social history . Use of alcohol, tobacco or illicit drugs  . Current medications and supplements . Functional ability and status . Nutritional status . Physical activity . Advanced directives . List of other physicians . Hospitalizations, surgeries, and ER visits in previous 12 months . Vitals . Screenings to include cognitive, depression, and falls . Referrals and appointments  In addition, I have reviewed and discussed with patient certain preventive protocols, quality metrics, and best practice recommendations. A written personalized care plan for preventive services as well as general preventive health recommendations were provided to patient.     Shela Nevin, [place]  09/15/2019

## 2019-09-15 ENCOUNTER — Ambulatory Visit (INDEPENDENT_AMBULATORY_CARE_PROVIDER_SITE_OTHER): Payer: Medicare HMO | Admitting: *Deleted

## 2019-09-15 ENCOUNTER — Encounter: Payer: Self-pay | Admitting: *Deleted

## 2019-09-15 ENCOUNTER — Ambulatory Visit (INDEPENDENT_AMBULATORY_CARE_PROVIDER_SITE_OTHER): Payer: Medicare HMO | Admitting: Family Medicine

## 2019-09-15 ENCOUNTER — Other Ambulatory Visit: Payer: Self-pay

## 2019-09-15 ENCOUNTER — Encounter: Payer: Self-pay | Admitting: Family Medicine

## 2019-09-15 VITALS — BP 132/62 | HR 70 | Temp 97.7°F | Ht 60.0 in | Wt 143.4 lb

## 2019-09-15 VITALS — BP 132/62 | HR 70 | Temp 97.7°F | Resp 18 | Ht 60.0 in | Wt 143.7 lb

## 2019-09-15 DIAGNOSIS — Z780 Asymptomatic menopausal state: Secondary | ICD-10-CM

## 2019-09-15 DIAGNOSIS — Z0000 Encounter for general adult medical examination without abnormal findings: Secondary | ICD-10-CM

## 2019-09-15 DIAGNOSIS — E785 Hyperlipidemia, unspecified: Secondary | ICD-10-CM

## 2019-09-15 DIAGNOSIS — I10 Essential (primary) hypertension: Secondary | ICD-10-CM | POA: Diagnosis not present

## 2019-09-15 DIAGNOSIS — E2839 Other primary ovarian failure: Secondary | ICD-10-CM | POA: Diagnosis not present

## 2019-09-15 DIAGNOSIS — R197 Diarrhea, unspecified: Secondary | ICD-10-CM | POA: Diagnosis not present

## 2019-09-15 DIAGNOSIS — A0472 Enterocolitis due to Clostridium difficile, not specified as recurrent: Secondary | ICD-10-CM

## 2019-09-15 DIAGNOSIS — Z1231 Encounter for screening mammogram for malignant neoplasm of breast: Secondary | ICD-10-CM

## 2019-09-15 NOTE — Telephone Encounter (Signed)
Colestid went through at the pharmacy; prior auth not required

## 2019-09-15 NOTE — Progress Notes (Signed)
Subjective:     Cindy Velasquez is a 78 y.o. female and is here for a comprehensive physical exam. The patient reports inc problem with diarrhea -- she has a f/u with Gi but is frustrated that this is a con't problem.  She admits to not telling GI it is ongoing .   No fever or blood  No NV  Social History   Socioeconomic History  . Marital status: Married    Spouse name: Mablene Lottman  . Number of children: 4  . Years of education: Not on file  . Highest education level: Not on file  Occupational History  . Occupation: retired--[employer]  Social Needs  . Financial resource strain: Not on file  . Food insecurity    Worry: Not on file    Inability: Not on file  . Transportation needs    Medical: Not on file    Non-medical: Not on file  Tobacco Use  . Smoking status: Former Smoker    Packs/day: 0.50    Years: 30.00    Pack years: 15.00    Types: Cigarettes    Quit date: 06/02/1991    Years since quitting: 28.3  . Smokeless tobacco: Never Used  Substance and Sexual Activity  . Alcohol use: Yes    Alcohol/week: 5.0 standard drinks    Types: 5 Glasses of wine per week  . Drug use: No  . Sexual activity: Not Currently    Partners: Male  Lifestyle  . Physical activity    Days per week: Not on file    Minutes per session: Not on file  . Stress: Not on file  Relationships  . Social Herbalist on phone: Not on file    Gets together: Not on file    Attends religious service: Not on file    Active member of club or organization: Not on file    Attends meetings of clubs or organizations: Not on file    Relationship status: Not on file  . Intimate partner violence    Fear of current or ex partner: Not on file    Emotionally abused: Not on file    Physically abused: Not on file    Forced sexual activity: Not on file  Other Topics Concern  . Not on file  Social History Narrative   Exercise--  [employer]--yoga and cardio 3x a week   Health  Maintenance  Topic Date Due  . MAMMOGRAM  05/29/2018  . INFLUENZA VACCINE  10/31/2019 (Originally 07/09/2019)  . TETANUS/TDAP  08/02/2023  . DEXA SCAN  Completed  . PNA vac Low Risk Adult  Completed    The following portions of the patient's history were reviewed and updated as appropriate: She  has a past medical history of Adenomatous colon polyp, Cancer ([place]) (03/2015), Diverticulosis of colon, GERD (gastroesophageal reflux disease), Hip pain, Hyperlipidemia, Hyperplastic colon polyp, Hypertension, Lactose intolerance, Loose stools, Lower back pain, Osteoporosis, and Palpitations. She does not have any pertinent problems on file. She  has a past surgical history that includes Tubal ligation; Hammer toe surgery; Rotator cuff repair; and Eye surgery (2012). Her family history includes Cancer (age of onset: 29) in her mother; Heart disease in her brother and father; Hyperlipidemia in her mother; Hypertension in her brother, brother, father, and mother; Obesity in her mother; Stroke in her father; Testicular cancer (age of onset: 44) in her brother. She  reports that she quit smoking about 28 years ago. Her smoking use  included cigarettes. She has a 15.00 pack-year smoking history. She has never used smokeless tobacco. She reports current alcohol use of about 5.0 standard drinks of alcohol per week. She reports that she does not use drugs. She has a current medication list which includes the following prescription(s): vitamin d3, colestipol, diphenhydramine-acetaminophen, hydrochlorothiazide, lisinopril, icaps areds 2, and rosuvastatin. Current Outpatient Medications on File Prior to Visit  Medication Sig Dispense Refill  . Cholecalciferol (VITAMIN D3) 125 MCG (5000 UT) CAPS Take 1 capsule (5,000 Units total) by mouth daily. 30 capsule 0  . colestipol (COLESTID) 1 g tablet TAKE 1 TABLET(1 GRAM) BY MOUTH TWICE DAILY (Patient taking differently: 2 g 2 (two) times daily. ) 60 tablet 3  .  diphenhydramine-acetaminophen (TYLENOL PM) 25-500 MG TABS tablet Take 1 tablet by mouth at bedtime.    . hydrochlorothiazide (HYDRODIURIL) 25 MG tablet TAKE 1/2 TABLET BY MOUTH EVERY MORNING 45 tablet 1  . lisinopril (ZESTRIL) 20 MG tablet Take 1 tablet (20 mg total) by mouth daily. 90 tablet 1  . Multiple Vitamins-Minerals (ICAPS AREDS 2) CAPS Take 2 capsules by mouth daily.     . rosuvastatin (CRESTOR) 20 MG tablet TAKE 1 TABLET BY MOUTH EVERY DAY 90 tablet 1   No current facility-administered medications on file prior to visit.    She has No Known Allergies..  Review of Systems Review of Systems  Constitutional: Negative for activity change, appetite change and fatigue.  HENT: Negative for hearing loss, congestion, tinnitus and ear discharge.  dentist q16m Eyes: Negative for visual disturbance (see optho q1y -- vision corrected to 20/20 with glasses).  Respiratory: Negative for cough, chest tightness and shortness of breath.   Cardiovascular: Negative for chest pain, palpitations and leg swelling.  Gastrointestinal: Negative for abdominal pain, diarrhea, constipation and abdominal distention.  Genitourinary: Negative for urgency, frequency, decreased urine volume and difficulty urinating.  Musculoskeletal: Negative for back pain, arthralgias and gait problem.  Skin: Negative for color change, pallor and rash.  Neurological: Negative for dizziness, light-headedness, numbness and headaches.  Hematological: Negative for adenopathy. Does not bruise/bleed easily.  Psychiatric/Behavioral: Negative for suicidal ideas, confusion, sleep disturbance, self-injury, dysphoric mood, decreased concentration and agitation.       Objective:    BP 132/62   Pulse 70   Temp 97.7 F (36.5 C) (Temporal)   Resp 18   Ht 5' (1.524 m)   Wt 143 lb 11.2 oz (65.2 kg)   SpO2 97%   BMI 28.06 kg/m  General appearance: alert, cooperative, appears stated age and no distress Head: Normocephalic, without  obvious abnormality, atraumatic Eyes: conjunctivae/corneas clear. PERRL, EOM's intact. Fundi benign. Ears: normal TM's and external ear canals both ears Nose: Nares normal. Septum midline. Mucosa normal. No drainage or sinus tenderness. Throat: lips, mucosa, and tongue normal; teeth and gums normal Neck: no adenopathy, no carotid bruit, no JVD, supple, symmetrical, trachea midline and thyroid not enlarged, symmetric, no tenderness/mass/nodules Back: symmetric, no curvature. ROM normal. No CVA tenderness. Lungs: clear to auscultation bilaterally Breasts: normal appearance, no masses or tenderness Heart: regular rate and rhythm, S1, S2 normal, no murmur, click, rub or gallop Abdomen: soft, non-tender; bowel sounds normal; no masses,  no organomegaly Pelvic: not indicated; post-menopausal, no abnormal Pap smears in past Extremities: extremities normal, atraumatic, no cyanosis or edema Pulses: 2+ and symmetric Skin: Skin color, texture, turgor normal. No rashes or lesions Lymph nodes: Cervical, supraclavicular, and axillary nodes normal. Neurologic: Alert and oriented X 3, normal strength and tone. Normal symmetric  reflexes. Normal coordination and gait    Assessment:    Healthy female exam.      Plan:    ghm utd Check labs  See After Visit Summary for Counseling Recommendations    1. Hyperlipidemia, unspecified hyperlipidemia type Tolerating statin, encouraged heart healthy diet, avoid trans fats, minimize simple carbs and saturated fats. Increase exercise as tolerated - Lipid panel - Comprehensive metabolic panel  2. Essential hypertension Well controlled, no changes to meds. Encouraged heart healthy diet such as the DASH diet and exercise as tolerated.  - Lipid panel - Comprehensive metabolic panel  3. Estrogen deficiency   - DG Bone Density; Future  4. Diarrhea, unspecified type Worsening---- pt to f/u with GI  - Fecal occult blood, imunochemical; Future - Stool  Culture - TSH - CBC with Differential/Platelet - C. difficile GDH and Toxin A/B

## 2019-09-15 NOTE — Patient Instructions (Signed)
Preventive Care 78 Years and Older, Female Preventive care refers to lifestyle choices and visits with your health care provider that can promote health and wellness. This includes:  A yearly physical exam. This is also called an annual well check.  Regular dental and eye exams.  Immunizations.  Screening for certain conditions.  Healthy lifestyle choices, such as diet and exercise. What can I expect for my preventive care visit? Physical exam Your health care provider will check:  Height and weight. These may be used to calculate body mass index (BMI), which is a measurement that tells if you are at a healthy weight.  Heart rate and blood pressure.  Your skin for abnormal spots. Counseling Your health care provider may ask you questions about:  Alcohol, tobacco, and drug use.  Emotional well-being.  Home and relationship well-being.  Sexual activity.  Eating habits.  History of falls.  Memory and ability to understand (cognition).  Work and work Statistician.  Pregnancy and menstrual history. What immunizations do I need?  Influenza (flu) vaccine  This is recommended every year. Tetanus, diphtheria, and pertussis (Tdap) vaccine  You may need a Td booster every 10 years. Varicella (chickenpox) vaccine  You may need this vaccine if you have not already been vaccinated. Zoster (shingles) vaccine  You may need this after age 78. Pneumococcal conjugate (PCV13) vaccine  One dose is recommended after age 78. Pneumococcal polysaccharide (PPSV23) vaccine  One dose is recommended after age 72. Measles, mumps, and rubella (MMR) vaccine  You may need at least one dose of MMR if you were born in 1957 or later. You may also need a second dose. Meningococcal conjugate (MenACWY) vaccine  You may need this if you have certain conditions. Hepatitis A vaccine  You may need this if you have certain conditions or if you travel or work in places where you may be exposed  to hepatitis A. Hepatitis B vaccine  You may need this if you have certain conditions or if you travel or work in places where you may be exposed to hepatitis B. Haemophilus influenzae type b (Hib) vaccine  You may need this if you have certain conditions. You may receive vaccines as individual doses or as more than one vaccine together in one shot (combination vaccines). Talk with your health care provider about the risks and benefits of combination vaccines. What tests do I need? Blood tests  Lipid and cholesterol levels. These may be checked every 5 years, or more frequently depending on your overall health.  Hepatitis C test.  Hepatitis B test. Screening  Lung cancer screening. You may have this screening every year starting at age 78 if you have a 30-pack-year history of smoking and currently smoke or have quit within the past 15 years.  Colorectal cancer screening. All adults should have this screening starting at age 78 and continuing until age 15. Your health care provider may recommend screening at age 23 if you are at increased risk. You will have tests every 1-10 years, depending on your results and the type of screening test.  Diabetes screening. This is done by checking your blood sugar (glucose) after you have not eaten for a while (fasting). You may have this done every 1-3 years.  Mammogram. This may be done every 1-2 years. Talk with your health care provider about how often you should have regular mammograms.  BRCA-related cancer screening. This may be done if you have a family history of breast, ovarian, tubal, or peritoneal cancers.  Other tests  Sexually transmitted disease (STD) testing.  Bone density scan. This is done to screen for osteoporosis. You may have this done starting at age 78. Follow these instructions at home: Eating and drinking  Eat a diet that includes fresh fruits and vegetables, whole grains, lean protein, and low-fat dairy products. Limit  your intake of foods with high amounts of sugar, saturated fats, and salt.  Take vitamin and mineral supplements as recommended by your health care provider.  Do not drink alcohol if your health care provider tells you not to drink.  If you drink alcohol: ? Limit how much you have to 0-1 drink a day. ? Be aware of how much alcohol is in your drink. In the [place] one drink equals one 12 oz bottle of beer (355 mL), one 5 oz glass of wine (148 mL), or one 1 oz glass of hard liquor (44 mL). Lifestyle  Take daily care of your teeth and gums.  Stay active. Exercise for at least 30 minutes on 5 or more days each week.  Do not use any products that contain nicotine or tobacco, such as cigarettes, e-cigarettes, and chewing tobacco. If you need help quitting, ask your health care provider.  If you are sexually active, practice safe sex. Use a condom or other form of protection in order to prevent STIs (sexually transmitted infections).  Talk with your health care provider about taking a low-dose aspirin or statin. What's next?  Go to your health care provider once a year for a well check visit.  Ask your health care provider how often you should have your eyes and teeth checked.  Stay up to date on all vaccines. This information is not intended to replace advice given to you by your health care provider. Make sure you discuss any questions you have with your health care provider. Document Released: 12/21/2015 Document Revised: 11/18/2018 Document Reviewed: 11/18/2018 Elsevier Patient Education  2020 [employer].

## 2019-09-15 NOTE — Patient Instructions (Signed)
See you next year!  Continue to eat heart healthy diet (full of fruits, vegetables, whole grains, lean protein, water--limit salt, fat, and sugar intake) and increase physical activity as tolerated.  Continue doing brain stimulating activities (puzzles, reading, adult coloring books, staying active) to keep memory sharp.   I have ordered you mammogram and bone density. Please schedule.   Cindy Velasquez , Thank you for taking time to come for your Medicare Wellness Visit. I appreciate your ongoing commitment to your health goals. Please review the following plan we discussed and let me know if I can assist you in the future.   These are the goals we discussed: Goals    . Increase physical activity    . Increase water intake       This is a list of the screening recommended for you and due dates:  Health Maintenance  Topic Date Due  . Mammogram  05/29/2018  . Flu Shot  10/31/2019*  . Tetanus Vaccine  08/02/2023  . DEXA scan (bone density measurement)  Completed  . Pneumonia vaccines  Completed  *Topic was postponed. The date shown is not the original due date.    Health Maintenance After Age 11 After age 80, you are at a higher risk for certain long-term diseases and infections as well as injuries from falls. Falls are a major cause of broken bones and head injuries in people who are older than age 62. Getting regular preventive care can help to keep you healthy and well. Preventive care includes getting regular testing and making lifestyle changes as recommended by your health care provider. Talk with your health care provider about:  Which screenings and tests you should have. A screening is a test that checks for a disease when you have no symptoms.  A diet and exercise plan that is right for you. What should I know about screenings and tests to prevent falls? Screening and testing are the best ways to find a health problem early. Early diagnosis and treatment give you the best chance  of managing medical conditions that are common after age 65. Certain conditions and lifestyle choices may make you more likely to have a fall. Your health care provider may recommend:  Regular vision checks. Poor vision and conditions such as cataracts can make you more likely to have a fall. If you wear glasses, make sure to get your prescription updated if your vision changes.  Medicine review. Work with your health care provider to regularly review all of the medicines you are taking, including over-the-counter medicines. Ask your health care provider about any side effects that may make you more likely to have a fall. Tell your health care provider if any medicines that you take make you feel dizzy or sleepy.  Osteoporosis screening. Osteoporosis is a condition that causes the bones to get weaker. This can make the bones weak and cause them to break more easily.  Blood pressure screening. Blood pressure changes and medicines to control blood pressure can make you feel dizzy.  Strength and balance checks. Your health care provider may recommend certain tests to check your strength and balance while standing, walking, or changing positions.  Foot health exam. Foot pain and numbness, as well as not wearing proper footwear, can make you more likely to have a fall.  Depression screening. You may be more likely to have a fall if you have a fear of falling, feel emotionally low, or feel unable to do activities that you used to  do.  Alcohol use screening. Using too much alcohol can affect your balance and may make you more likely to have a fall. What actions can I take to lower my risk of falls? General instructions  Talk with your health care provider about your risks for falling. Tell your health care provider if: ? You fall. Be sure to tell your health care provider about all falls, even ones that seem minor. ? You feel dizzy, sleepy, or off-balance.  Take over-the-counter and prescription  medicines only as told by your health care provider. These include any supplements.  Eat a healthy diet and maintain a healthy weight. A healthy diet includes low-fat dairy products, low-fat (lean) meats, and fiber from whole grains, beans, and lots of fruits and vegetables. Home safety  Remove any tripping hazards, such as rugs, cords, and clutter.  Install safety equipment such as grab bars in bathrooms and safety rails on stairs.  Keep rooms and walkways well-lit. Activity   Follow a regular exercise program to stay fit. This will help you maintain your balance. Ask your health care provider what types of exercise are appropriate for you.  If you need a cane or walker, use it as recommended by your health care provider.  Wear supportive shoes that have nonskid soles. Lifestyle  Do not drink alcohol if your health care provider tells you not to drink.  If you drink alcohol, limit how much you have: ? 0-1 drink a day for women. ? 0-2 drinks a day for men.  Be aware of how much alcohol is in your drink. In the [place] one drink equals one typical bottle of beer (12 oz), one-half glass of wine (5 oz), or one shot of hard liquor (1 oz).  Do not use any products that contain nicotine or tobacco, such as cigarettes and e-cigarettes. If you need help quitting, ask your health care provider. Summary  Having a healthy lifestyle and getting preventive care can help to protect your health and wellness after age 62.  Screening and testing are the best way to find a health problem early and help you avoid having a fall. Early diagnosis and treatment give you the best chance for managing medical conditions that are more common for people who are older than age 22.  Falls are a major cause of broken bones and head injuries in people who are older than age 16. Take precautions to prevent a fall at home.  Work with your health care provider to learn what changes you can make to improve your  health and wellness and to prevent falls. This information is not intended to replace advice given to you by your health care provider. Make sure you discuss any questions you have with your health care provider. Document Released: 10/07/2017 Document Revised: 03/17/2019 Document Reviewed: 10/07/2017 Elsevier Patient Education  2020 [employer].

## 2019-09-16 LAB — COMPREHENSIVE METABOLIC PANEL
ALT: 20 U/L (ref 0–35)
AST: 18 U/L (ref 0–37)
Albumin: 3.8 g/dL (ref 3.5–5.2)
Alkaline Phosphatase: 69 U/L (ref 39–117)
BUN: 17 mg/dL (ref 6–23)
CO2: 32 mEq/L (ref 19–32)
Calcium: 9.3 mg/dL (ref 8.4–10.5)
Chloride: 101 mEq/L (ref 96–112)
Creatinine, Ser: 0.64 mg/dL (ref 0.40–1.20)
GFR: 89.71 mL/min (ref >60.00)
Glucose, Bld: 77 mg/dL (ref 70–99)
Potassium: 3.3 mEq/L — ABNORMAL LOW (ref 3.5–5.1)
Sodium: 141 mEq/L (ref 135–145)
Total Bilirubin: 0.5 mg/dL (ref 0.2–1.2)
Total Protein: 6.7 g/dL (ref 6.0–8.3)

## 2019-09-16 LAB — CBC WITH DIFFERENTIAL/PLATELET
Basophils Absolute: 0.1 10*3/uL (ref 0.0–0.1)
Basophils Relative: 1 % (ref 0.0–3.0)
Eosinophils Absolute: 0.2 10*3/uL (ref 0.0–0.7)
Eosinophils Relative: 2.8 % (ref 0.0–5.0)
HCT: 40 % (ref 36.0–46.0)
Hemoglobin: 13.2 g/dL (ref 12.0–15.0)
Lymphocytes Relative: 35.3 % (ref 12.0–46.0)
Lymphs Abs: 2.6 10*3/uL (ref 0.7–4.0)
MCHC: 33 g/dL (ref 30.0–36.0)
MCV: 89.9 fl (ref 78.0–100.0)
Monocytes Absolute: 0.7 10*3/uL (ref 0.1–1.0)
Monocytes Relative: 10.1 % (ref 3.0–12.0)
Neutro Abs: 3.7 10*3/uL (ref 1.4–7.7)
Neutrophils Relative %: 50.8 % (ref 43.0–77.0)
Platelets: 245 10*3/uL (ref 150.0–400.0)
RBC: 4.45 Mil/uL (ref 3.87–5.11)
RDW: 15.1 % (ref 11.5–15.5)
WBC: 7.3 10*3/uL (ref 4.0–10.5)

## 2019-09-16 LAB — LIPID PANEL
Cholesterol: 141 mg/dL (ref 0–200)
HDL: 43.4 mg/dL (ref >39.00)
LDL Cholesterol: 58 mg/dL (ref 0–99)
NonHDL: 97.54
Total CHOL/HDL Ratio: 3
Triglycerides: 198 mg/dL — ABNORMAL HIGH (ref 0.0–149.0)
VLDL: 39.6 mg/dL (ref 0.0–40.0)

## 2019-09-16 LAB — TSH: TSH: 1.29 u[IU]/mL (ref 0.35–4.50)

## 2019-09-19 ENCOUNTER — Other Ambulatory Visit: Payer: Medicare HMO

## 2019-09-19 ENCOUNTER — Other Ambulatory Visit: Payer: Self-pay

## 2019-09-19 DIAGNOSIS — A0472 Enterocolitis due to Clostridium difficile, not specified as recurrent: Secondary | ICD-10-CM | POA: Diagnosis not present

## 2019-09-19 DIAGNOSIS — R197 Diarrhea, unspecified: Secondary | ICD-10-CM | POA: Diagnosis not present

## 2019-09-19 NOTE — Addendum Note (Signed)
Addended by: Caffie Pinto on: 09/19/2019 11:16 AM   Modules accepted: Orders

## 2019-09-19 NOTE — Addendum Note (Signed)
Addended by: Caffie Pinto on: 09/19/2019 11:17 AM   Modules accepted: Orders

## 2019-09-20 ENCOUNTER — Ambulatory Visit (HOSPITAL_BASED_OUTPATIENT_CLINIC_OR_DEPARTMENT_OTHER)
Admission: RE | Admit: 2019-09-20 | Discharge: 2019-09-20 | Disposition: A | Discharge status: Home / Self Care | Payer: Medicare HMO | Source: Ambulatory Visit | Attending: Family Medicine | Admitting: Family Medicine

## 2019-09-20 ENCOUNTER — Other Ambulatory Visit (HOSPITAL_BASED_OUTPATIENT_CLINIC_OR_DEPARTMENT_OTHER): Payer: Medicare HMO

## 2019-09-20 ENCOUNTER — Other Ambulatory Visit (INDEPENDENT_AMBULATORY_CARE_PROVIDER_SITE_OTHER): Payer: Medicare HMO

## 2019-09-20 DIAGNOSIS — R197 Diarrhea, unspecified: Secondary | ICD-10-CM

## 2019-09-20 DIAGNOSIS — Z1231 Encounter for screening mammogram for malignant neoplasm of breast: Secondary | ICD-10-CM | POA: Insufficient documentation

## 2019-09-20 LAB — C. DIFFICILE GDH AND TOXIN A/B
GDH ANTIGEN: DETECTED
MICRO NUMBER:: 979308
SPECIMEN QUALITY:: ADEQUATE
TOXIN A AND B: NOT DETECTED

## 2019-09-20 LAB — CLOSTRIDIUM DIFFICILE TOXIN B, QUALITATIVE, REAL-TIME PCR: Toxigenic C. Difficile by PCR: NOT DETECTED

## 2019-09-20 LAB — FECAL OCCULT BLOOD, IMMUNOCHEMICAL: Fecal Occult Bld: NEGATIVE

## 2019-09-21 ENCOUNTER — Other Ambulatory Visit: Payer: Self-pay

## 2019-09-21 MED ORDER — POTASSIUM CHLORIDE CRYS ER 20 MEQ PO TBCR: 20.0000 meq | EXTENDED_RELEASE_TABLET | Freq: Every day | ORAL | 3 refills | Status: DC

## 2019-09-23 LAB — STOOL CULTURE
MICRO NUMBER:: 979038
MICRO NUMBER:: 979039
MICRO NUMBER:: 979040
SHIGA RESULT:: NOT DETECTED
SPECIMEN QUALITY:: ADEQUATE
SPECIMEN QUALITY:: ADEQUATE
SPECIMEN QUALITY:: ADEQUATE

## 2019-10-07 ENCOUNTER — Ambulatory Visit: Payer: Medicare HMO | Admitting: Internal Medicine

## 2019-10-07 ENCOUNTER — Encounter: Payer: Self-pay | Admitting: Internal Medicine

## 2019-10-07 VITALS — BP 130/80 | HR 64 | Temp 98.0°F | Ht 59.5 in | Wt 147.1 lb

## 2019-10-07 DIAGNOSIS — R197 Diarrhea, unspecified: Secondary | ICD-10-CM | POA: Diagnosis not present

## 2019-10-07 MED ORDER — DIPHENOXYLATE-ATROPINE 2.5-0.025 MG PO TABS: 1.0000 | ORAL_TABLET | Freq: Three times a day (TID) | ORAL | 2 refills | Status: DC

## 2019-10-07 MED ORDER — METRONIDAZOLE 250 MG PO TABS: 250.0000 mg | ORAL_TABLET | Freq: Three times a day (TID) | ORAL | 1 refills | Status: AC

## 2019-10-07 NOTE — Patient Instructions (Signed)
If you are age 78 or older, your body mass index should be between 23-30. Your Body mass index is 29.22 kg/m. If this is out of the aforementioned range listed, please consider follow up with your Primary Care Provider.  If you are age 53 or younger, your body mass index should be between 19-25. Your Body mass index is 29.22 kg/m. If this is out of the aformentioned range listed, please consider follow up with your Primary Care Provider.   We have sent the following medications to your pharmacy for you to pick up at your convenience:  Flagyl 250mg , take 1 tablet 3 times daily for 14 days with 1 refill  Lomotil take 1 tablet 3 times daily, 2 refills   Follow up in 3 months.   Thank you for choosing me and [facility]  Dr Henrene Pastor

## 2019-10-07 NOTE — Progress Notes (Signed)
HISTORY OF PRESENT ILLNESS:  Cindy Velasquez is a 78 y.o. female, native of [place], who presents today for follow-up regarding recurrent diarrhea.  The patient has had problems with diarrhea for greater than 1 year.  She has undergone extensive work-up including colonoscopy with negative random biopsies in January 2020.  Testing for celiac disease and pancreatic insufficiency were both negative.  Colestid was unsatisfactorily helpful.  However she was treated with a 10-day course of metronidazole which promptly resolved her issues with diarrhea.  She did well for about 2 months and now has developed recurrent diarrhea.  Describing 5 or 6 bowel movements which are loose daily.  Mostly in the morning.  Often after meals.  She has had weight loss which is by intent.  No new problems.  She does report rare occasions of right lower quadrant discomfort which is short-lived and tolerable.  She recently contacted the office and requested a refill of Colestid.  She has been taking 4 g daily without success.  She has not use antidiarrheals.  No incontinence though it does affect her quality of life and ability to get out of the house for extended periods of time.  REVIEW OF SYSTEMS:  All non-GI ROS negative unless otherwise stated in the HPI except for urinary leakage  Past Medical History:  Diagnosis Date  . Adenomatous colon polyp   . Cancer ([place]) 03/2015   BCC R tibia   . Diverticulosis of colon   . GERD (gastroesophageal reflux disease)   . Hip pain   . Hyperlipidemia   . Hyperplastic colon polyp   . Hypertension   . Lactose intolerance   . Loose stools   . Lower back pain   . Osteoporosis   . Palpitations     Past Surgical History:  Procedure Laterality Date  . EYE SURGERY  2012   B/L 01/2011  02/2011  . HAMMER TOE SURGERY     Bilateral  . ROTATOR CUFF REPAIR     Left  . TUBAL LIGATION      Social History Cindy Velasquez  reports that she quit smoking about 28 years ago. Her  smoking use included cigarettes. She has a 15.00 pack-year smoking history. She has never used smokeless tobacco. She reports current alcohol use of about 5.0 standard drinks of alcohol per week. She reports that she does not use drugs.  family history includes Cancer (age of onset: 69) in her mother; Heart disease in her brother and father; Hyperlipidemia in her mother; Hypertension in her brother, brother, father, and mother; Obesity in her mother; Stroke in her father; Testicular cancer (age of onset: 22) in her brother.  No Known Allergies     PHYSICAL EXAMINATION: Vital signs: BP 130/80 (BP Location: Left Arm, Patient Position: Sitting, Cuff Size: Normal)   Pulse 64 Comment: irregular  Temp 98 F (36.7 C)   Ht 4' 11.5" (1.511 m) Comment: height measured without shoes  Wt 147 lb 2 oz (66.7 kg)   BMI 29.22 kg/m   Constitutional: generally well-appearing, no acute distress Psychiatric: alert and oriented x3, cooperative Eyes: extraocular movements intact, anicteric, conjunctiva pink Mouth: oral pharynx moist, no lesions Neck: supple no lymphadenopathy Cardiovascular: heart regular rate and rhythm, no murmur Lungs: clear to auscultation bilaterally Abdomen: soft, nontender, nondistended, no obvious ascites, no peritoneal signs, normal bowel sounds, no organomegaly Rectal: Omitted Extremities: no clubbing, cyanosis, or lower extremity edema bilaterally Skin: no lesions on visible extremities Neuro: No focal deficits.  Cranial  nerves intact  ASSESSMENT:  1.  Recurrent diarrhea.  Previously responded to metronidazole raising the question of bacterial overgrowth or possibly Giardia.  Now with recurrence after having done well for several months.  No new features or alarm features.   PLAN:  1.  Prescribe metronidazole 250 mg 3 times daily for 14 days.  1 refill provided.  Medication effects, side effects, and risks reviewed 2.  Prescribe Lomotil 1 3 times daily as needed for  diarrhea.  Refills.  Medication effects, side effects, and risks reviewed 3.  Routine office follow-up 3 months.  I have asked her to contact the office in the interim for updates. 25 minutes spent face-to-face with the patient.  Greater than 50% of the time used for counseling regarding her severe diarrhea issues.

## 2019-12-19 ENCOUNTER — Ambulatory Visit: Payer: Medicare HMO

## 2019-12-19 ENCOUNTER — Ambulatory Visit: Payer: Medicare Other | Attending: Internal Medicine

## 2019-12-19 DIAGNOSIS — Z23 Encounter for immunization: Secondary | ICD-10-CM | POA: Insufficient documentation

## 2019-12-19 NOTE — Progress Notes (Signed)
   Covid-19 Vaccination Clinic  Name:  IVIS LAWWILL    MRN: [id number] DOB: Nov 13, 1941  12/19/2019  Ms. Lablanc was observed post Covid-19 immunization for 30 minutes based on pre-vaccination screening without incidence. She was provided with Vaccine Information Sheet and instruction to access the V-Safe system.   Ms. Bania was instructed to call 911 with any severe reactions post vaccine: Marland Kitchen Difficulty breathing  . Swelling of your face and throat  . A fast heartbeat  . A bad rash all over your body  . Dizziness and weakness

## 2020-01-02 DIAGNOSIS — H26493 Other secondary cataract, bilateral: Secondary | ICD-10-CM | POA: Diagnosis not present

## 2020-01-02 DIAGNOSIS — H10413 Chronic giant papillary conjunctivitis, bilateral: Secondary | ICD-10-CM | POA: Diagnosis not present

## 2020-01-02 DIAGNOSIS — Z961 Presence of intraocular lens: Secondary | ICD-10-CM | POA: Diagnosis not present

## 2020-01-02 DIAGNOSIS — H0102A Squamous blepharitis right eye, upper and lower eyelids: Secondary | ICD-10-CM | POA: Diagnosis not present

## 2020-01-02 DIAGNOSIS — H04223 Epiphora due to insufficient drainage, bilateral lacrimal glands: Secondary | ICD-10-CM | POA: Diagnosis not present

## 2020-01-02 DIAGNOSIS — H0102B Squamous blepharitis left eye, upper and lower eyelids: Secondary | ICD-10-CM | POA: Diagnosis not present

## 2020-01-02 DIAGNOSIS — H353132 Nonexudative age-related macular degeneration, bilateral, intermediate dry stage: Secondary | ICD-10-CM | POA: Diagnosis not present

## 2020-01-06 ENCOUNTER — Other Ambulatory Visit: Payer: Self-pay | Admitting: Family Medicine

## 2020-01-08 ENCOUNTER — Ambulatory Visit: Payer: Medicare HMO | Attending: Internal Medicine

## 2020-01-08 DIAGNOSIS — Z23 Encounter for immunization: Secondary | ICD-10-CM | POA: Insufficient documentation

## 2020-01-08 NOTE — Progress Notes (Signed)
   Covid-19 Vaccination Clinic  Name:  Cindy Velasquez    MRN: [id number] DOB: 1941/01/28  01/08/2020  Cindy Velasquez was observed post Covid-19 immunization for 15 minutes without incidence. She was provided with Vaccine Information Sheet and instruction to access the V-Safe system.   Cindy Velasquez was instructed to call 911 with any severe reactions post vaccine: Marland Kitchen Difficulty breathing  . Swelling of your face and throat  . A fast heartbeat  . A bad rash all over your body  . Dizziness and weakness    Immunizations Administered    Name Date Dose VIS Date Route   Pfizer COVID-19 Vaccine 01/08/2020  9:42 AM 0.3 mL 11/18/2019 Intramuscular   Manufacturer: [place]   Lot: [id number]   [place]: [id number]

## 2020-02-07 ENCOUNTER — Other Ambulatory Visit: Payer: Self-pay | Admitting: *Deleted

## 2020-02-07 MED ORDER — HYDROCHLOROTHIAZIDE 25 MG PO TABS: 12.5000 mg | ORAL_TABLET | Freq: Every morning | ORAL | 1 refills | Status: DC

## 2020-02-16 ENCOUNTER — Encounter (HOSPITAL_BASED_OUTPATIENT_CLINIC_OR_DEPARTMENT_OTHER): Payer: Self-pay

## 2020-02-16 ENCOUNTER — Emergency Department (HOSPITAL_BASED_OUTPATIENT_CLINIC_OR_DEPARTMENT_OTHER)
Admission: EM | Admit: 2020-02-16 | Discharge: 2020-02-16 | Disposition: A | Discharge status: Home / Self Care | Payer: Medicare HMO | Attending: Emergency Medicine | Admitting: Emergency Medicine

## 2020-02-16 ENCOUNTER — Emergency Department (HOSPITAL_BASED_OUTPATIENT_CLINIC_OR_DEPARTMENT_OTHER): Payer: Medicare HMO

## 2020-02-16 ENCOUNTER — Other Ambulatory Visit: Payer: Self-pay

## 2020-02-16 DIAGNOSIS — M25562 Pain in left knee: Secondary | ICD-10-CM

## 2020-02-16 DIAGNOSIS — E785 Hyperlipidemia, unspecified: Secondary | ICD-10-CM | POA: Insufficient documentation

## 2020-02-16 DIAGNOSIS — M25462 Effusion, left knee: Secondary | ICD-10-CM | POA: Diagnosis not present

## 2020-02-16 DIAGNOSIS — S79912A Unspecified injury of left hip, initial encounter: Secondary | ICD-10-CM | POA: Diagnosis not present

## 2020-02-16 DIAGNOSIS — I10 Essential (primary) hypertension: Secondary | ICD-10-CM | POA: Insufficient documentation

## 2020-02-16 DIAGNOSIS — Z79899 Other long term (current) drug therapy: Secondary | ICD-10-CM | POA: Insufficient documentation

## 2020-02-16 DIAGNOSIS — Z87891 Personal history of nicotine dependence: Secondary | ICD-10-CM | POA: Insufficient documentation

## 2020-02-16 DIAGNOSIS — R7303 Prediabetes: Secondary | ICD-10-CM | POA: Diagnosis not present

## 2020-02-16 DIAGNOSIS — S8012XA Contusion of left lower leg, initial encounter: Secondary | ICD-10-CM | POA: Diagnosis not present

## 2020-02-16 DIAGNOSIS — W19XXXA Unspecified fall, initial encounter: Secondary | ICD-10-CM

## 2020-02-16 MED ORDER — NAPROXEN 250 MG PO TABS
500.0000 mg | ORAL_TABLET | Freq: Once | ORAL | Status: AC
  Administered 2020-02-16: 500 mg via ORAL
  Filled 2020-02-16: qty 2

## 2020-02-16 NOTE — Discharge Instructions (Signed)
Your x-ray today show a small fracture to the tibial spine.  We have placed your left knee on a immobilizer, please keep this on until further follow-up with orthopedics.  You may take some over-the-counter anti-inflammatories to help with your pain.

## 2020-02-16 NOTE — ED Provider Notes (Signed)
[facility] EMERGENCY DEPARTMENT Provider Note   CSN: [id number] Arrival date & time: 02/16/20  1740     History Chief Complaint  Patient presents with  . Leg Injury    Cindy Velasquez is a 79 y.o. female.  79 y.o female with a PMH presents to the ED status post fall while at home.  Patient reports was trying to place the rug when she suddenly slipped with her "acute issues", landing on the left knee.  She reports pain along the inner aspect of the knee, states she was not able to ambulate after the incident without pain.  She has not taken any medication for improvement in her symptoms.  Pain is worse with weightbearing.  He is currently not on any blood thinners, denies any other injury or complaint.  The history is provided by the patient and medical records.       Past Medical History:  Diagnosis Date  . Adenomatous colon polyp   . Cancer ([place]) 03/2015   BCC R tibia   . Diverticulosis of colon   . GERD (gastroesophageal reflux disease)   . Hip pain   . Hyperlipidemia   . Hyperplastic colon polyp   . Hypertension   . Lactose intolerance   . Loose stools   . Lower back pain   . Osteoporosis   . Palpitations     Patient Active Problem List   Diagnosis Date Noted  . Prediabetes 12/13/2018  . Vitamin D deficiency 12/13/2018  . Overweight (BMI 25.0-29.9) 12/13/2018  . Preventative health care 09/15/2018  . Bilateral cold feet 06/17/2018  . Idiopathic peripheral neuropathy 06/17/2018  . Lower extremity edema 06/17/2018  . Right shoulder pain 08/20/2016  . Knee pain, bilateral 04/23/2015  . Urticaria 10/13/2014  . Neuropathy 10/13/2014  . Fatigue 10/13/2014  . Abrasion of face 08/01/2013  . Rib pain on left side 07/25/2013  . Tinea cruris 06/22/2012  . Eustachian tube dysfunction 04/07/2012  . Cerumen impaction 04/07/2012  . URI 10/30/2010  . CHANGE IN BOWELS 04/03/2010  . FECAL OCCULT BLOOD 04/03/2010  . PERSONAL HX COLONIC POLYPS 04/03/2010  .  DYSPEPSIA 03/04/2010  . GUAIAC POSITIVE STOOL 03/04/2010  . HEMATURIA UNSPECIFIED 06/28/2009  . DYSURIA 06/28/2009  . HAMMER TOE 12/28/2008  . POSTMENOPAUSAL STATUS 12/28/2008  . BACK PAIN, LUMBAR 07/11/2008  . BENIGN NEOPLASM OF SKIN SITE UNSPECIFIED 02/28/2008  . Hyperlipidemia 04/15/2007  . Essential hypertension 04/15/2007  . DIVERTICULOSIS, COLON 04/15/2007  . THUMB PAIN, RIGHT 04/15/2007  . OSTEOPOROSIS 04/15/2007    Past Surgical History:  Procedure Laterality Date  . EYE SURGERY  2012   B/L 01/2011  02/2011  . HAMMER TOE SURGERY     Bilateral  . ROTATOR CUFF REPAIR     Left  . TUBAL LIGATION       OB History    Gravida  4   Para      Term      Preterm      AB      Living  4     SAB      TAB      Ectopic      Multiple      Live Births              Family History  Problem Relation Age of Onset  . Hypertension Mother   . Cancer Mother 36       ?  Marland Kitchen Hyperlipidemia Mother   . Obesity Mother   .  Hypertension Father   . Stroke Father   . Heart disease Father   . Hypertension Brother   . Testicular cancer Brother 22  . Hypertension Brother   . Heart disease Brother        quadruple bypass  . Colon cancer Neg Hx   . Stomach cancer Neg Hx   . Throat cancer Neg Hx     Social History   Tobacco Use  . Smoking status: Former Smoker    Packs/day: 0.50    Years: 30.00    Pack years: 15.00    Types: Cigarettes    Quit date: 06/02/1991    Years since quitting: 28.7  . Smokeless tobacco: Never Used  Substance Use Topics  . Alcohol use: Yes    Alcohol/week: 5.0 standard drinks    Types: 5 Glasses of wine per week  . Drug use: No    Home Medications Prior to Admission medications   Medication Sig Start Date End Date Taking? Authorizing Provider  Cholecalciferol (VITAMIN D3) 125 MCG (5000 UT) CAPS Take 1 capsule (5,000 Units total) by mouth daily. 04/05/19  Yes Abby Potash, PA-C  diphenhydramine-acetaminophen (TYLENOL PM) 25-500 MG  TABS tablet Take 1 tablet by mouth at bedtime.   Yes [provider]  hydrochlorothiazide (HYDRODIURIL) 25 MG tablet Take 0.5 tablets (12.5 mg total) by mouth every morning. 02/07/20  Yes Roma Schanz R, DO  lisinopril (ZESTRIL) 20 MG tablet Take 1 tablet (20 mg total) by mouth daily. 07/18/19  Yes Roma Schanz R, DO  Multiple Vitamins-Minerals (ICAPS AREDS 2) CAPS Take 2 capsules by mouth daily.    Yes [provider]  rosuvastatin (CRESTOR) 20 MG tablet TAKE 1 TABLET BY MOUTH EVERY DAY 01/06/20  Yes Roma Schanz R, DO  colestipol (COLESTID) 1 g tablet TAKE 1 TABLET(1 GRAM) BY MOUTH TWICE DAILY Patient taking differently: 2 g 2 (two) times daily.  09/13/19   Irene Shipper, MD  diphenoxylate-atropine (LOMOTIL) 2.5-0.025 MG tablet Take 1 tablet by mouth 3 (three) times daily. 10/07/19   Irene Shipper, MD  potassium chloride SA (KLOR-CON) 20 MEQ tablet Take 1 tablet (20 mEq total) by mouth daily. Patient not taking: Reported on 10/07/2019 09/21/19   Ann Held, DO    Allergies    Patient has no known allergies.  Review of Systems   Review of Systems  Constitutional: Negative for fever.  Gastrointestinal: Negative for abdominal pain and nausea.  Musculoskeletal: Positive for arthralgias and myalgias. Negative for back pain.    Physical Exam Updated Vital Signs BP (!) 146/57 (BP Location: Right Arm)   Pulse 80   Temp 98.9 F (37.2 C)   Resp 18   Ht 4\' 11"  (1.499 m)   Wt 68 kg   SpO2 96%   BMI 30.30 kg/m   Physical Exam Vitals and nursing note reviewed.  Constitutional:      Appearance: Normal appearance.  HENT:     Head: Normocephalic and atraumatic.     Nose: Nose normal.     Mouth/Throat:     Mouth: Mucous membranes are moist.  Eyes:     Pupils: Pupils are equal, round, and reactive to light.  Cardiovascular:     Rate and Rhythm: Normal rate.  Pulmonary:     Effort: Pulmonary effort is normal.  Abdominal:     General:  Abdomen is flat.  Musculoskeletal:     Cervical back: Normal range of motion and neck supple.  Left knee: Effusion present. No swelling, deformity or erythema. Normal range of motion. Tenderness present over the medial joint line. No patellar tendon tenderness.       Legs:     Comments: Pulses present, full ROM with pain. TTP along the   Skin:    General: Skin is warm and dry.  Neurological:     Mental Status: She is alert and oriented to person, place, and time.     ED Results / Procedures / Treatments   Labs (all labs ordered are listed, but only abnormal results are displayed) Labs Reviewed - No data to display  EKG None  Radiology DG Tibia/Fibula Left  Result Date: 02/16/2020 CLINICAL DATA:  Left lower leg pain after fall today. Bruising. EXAM: LEFT TIBIA AND FIBULA - 2 VIEW COMPARISON:  None. FINDINGS: Lucency through the mid tibial spines. Cortical margins of the tibia otherwise intact. No evidence of fibular fracture. Cannot assess for joint effusion, knee joint not included in the field of view. Soft tissues are unremarkable. IMPRESSION: Lucency through the mid tibial spines may be a nondisplaced fracture. Recommend dedicated knee radiographs. No other fracture of the left lower leg. Electronically Signed   By: Keith Rake M.D.   On: 02/16/2020 19:29   DG Knee Complete 4 Views Left  Result Date: 02/16/2020 CLINICAL DATA:  Fall today with left knee pain. Lucency of the tibial spines on tib fib radiograph. EXAM: LEFT KNEE - COMPLETE 4+ VIEW COMPARISON:  Tibia-fibula radiograph earlier today. FINDINGS: Suspected avulsion fracture involving the medial tibial spine which is minimally displaced. No other fracture. Mild peripheral spurring. There is a small knee joint effusion. Quadriceps and patellar tendon enthesophytes. IMPRESSION: Suspected minimally displaced avulsion fracture involving the medial tibial spine. Small joint effusion. Electronically Signed   By: Keith Rake M.D.   On: 02/16/2020 20:17   DG HIP UNILAT WITH PELVIS 2-3 VIEWS LEFT  Result Date: 02/16/2020 CLINICAL DATA:  Left hip pain after fall today EXAM: DG HIP (WITH OR WITHOUT PELVIS) 2-3V LEFT COMPARISON:  None. FINDINGS: The cortical margins of the bony pelvis and left hip are intact. No fracture. Pubic symphysis and sacroiliac joints are congruent. Both femoral heads are well-seated in the respective acetabula. IMPRESSION: Negative radiographs of the pelvis and left hip. Electronically Signed   By: Keith Rake M.D.   On: 02/16/2020 19:31    Procedures Procedures (including critical care time)  Medications Ordered in ED Medications  naproxen (NAPROSYN) tablet 500 mg (500 mg Oral Given 02/16/20 1958)    ED Course  I have reviewed the triage vital signs and the nursing notes.  Pertinent labs & imaging results that were available during my care of the patient were reviewed by me and considered in my medical decision making (see chart for details).    MDM Rules/Calculators/A&P    Patient with no pertinent past medical history presents to the ED s/p chemical fall while at home.  She is currently not on any blood thinners, reports pain along to the left knee, more so on the medial aspect of the.  Reports his pain is worse with weightbearing along with ambulation.  Xray of the left hip showed no fracture or dislocation. DG tibi fib with a questionable fracture.Isolated knee xrays obtained which showed: Suspected minimally displaced avulsion fracture involving the medial  tibial spine. Small joint effusion.     During evaluation neurovascularly intact, vitals are within normal limits.  Patient was placed on a knee immobilizer along  with provided with crutches and orthopedic follow-up.  Portions of this note were generated with Lobbyist. Dictation errors may occur despite best attempts at proofreading.  Final Clinical Impression(s) / ED Diagnoses Final diagnoses:   Fall, initial encounter  Acute pain of left knee    Rx / DC Orders ED Discharge Orders    None       Janeece Fitting, PA-C 02/16/20 2056    Sherwood Gambler, MD 02/16/20 2314

## 2020-02-16 NOTE — ED Triage Notes (Signed)
Pt states she fell today-c/o pain to entire left LE when she attempts to bear weight-NAD-to triage in w/c

## 2020-02-16 NOTE — ED Notes (Signed)
Pt reports pain with ambulation left leg, s/p fall today, unable to pinpoint specific area of pain, states "whole leg"

## 2020-02-20 ENCOUNTER — Other Ambulatory Visit: Payer: Self-pay

## 2020-02-20 ENCOUNTER — Ambulatory Visit: Payer: Medicare HMO | Admitting: Orthopaedic Surgery

## 2020-02-20 ENCOUNTER — Encounter: Payer: Self-pay | Admitting: Orthopaedic Surgery

## 2020-02-20 DIAGNOSIS — S82115A Nondisplaced fracture of left tibial spine, initial encounter for closed fracture: Secondary | ICD-10-CM | POA: Diagnosis not present

## 2020-02-20 NOTE — Progress Notes (Signed)
Office Visit Note   Patient: Cindy Velasquez           Date of Birth: 05-Jul-1941           MRN: [id number] Visit Date: 02/20/2020              Requested by: [address], [place], [place] [address] [place],  [place] [address] PCP: Carollee Herter, Alferd Apa, DO   Assessment & Plan: Visit Diagnoses:  1. Closed nondisplaced fracture of spine of left tibia, initial encounter     Plan: I went over the patient's x-rays with her as well as the knee model showing and described in the fracture that she has.  This is a stable fracture pattern and should do well with time.  She can attempt full weightbearing as tolerated.  We will try hinged knee brace instead of the knee immobilizer.  I did give her a prescription for a rolling walker with a seat and instructions to listen to her body to not increase her activities too much if she is hurting too much.  All question concerns were answered and addressed.  I did talk to her daughter on the phone as well.  We will see her back in 4 weeks with a repeat AP and lateral of the left knee.  This can be done supine.  Follow-Up Instructions: Return in about 4 weeks (around 03/19/2020).   Orders:  No orders of the defined types were placed in this encounter.  No orders of the defined types were placed in this encounter.     Procedures: No procedures performed   Clinical Data: No additional findings.   Subjective: Chief Complaint  Patient presents with  . Left Knee - Pain  The patient is a very pleasant 79 year old female who is referred from the emergency room at [facility] after she slipped on a rug and fell onto her left knee.  She went to the emergency room was found to have a small avulsion fracture of the tibial eminence over the tibial spine of her left knee.  She has never injured this knee before.  She is placed appropriately in a knee immobilizer and told to be nonweightbearing until she came to see orthopedics in  follow-up.  She has never had surgery on her left knee before nor injured her knee.  She is active.  Her husband removing into this new house when she excellently fell and this happened.  HPI  Review of Systems She currently denies any headache, chest pain, shortness of breath, fever, chills, nausea, vomiting  Objective: Vital Signs: There were no vitals taken for this visit.  Physical Exam She is alert and orient x3 and in no acute distress Ortho Exam Examination of her left knee does show a mild to moderate effusion comparing left and right knees and there is some bruising.  Her extensor mechanism is intact.  Her knee feels ligamentously stable. Specialty Comments:  No specialty comments available.  Imaging: No results found. X-rays of the left knee reviewed independently that were done at the time of injury on 02/16/2020 shows a nondisplaced fracture of the left knee tibial spine.  This only goes to the medial aspect.  The remainder of the knee x-rays are normal.  PMFS History: Patient Active Problem List   Diagnosis Date Noted  . Prediabetes 12/13/2018  . Vitamin D deficiency 12/13/2018  . Overweight (BMI 25.0-29.9) 12/13/2018  . Preventative health care 09/15/2018  .  Bilateral cold feet 06/17/2018  . Idiopathic peripheral neuropathy 06/17/2018  . Lower extremity edema 06/17/2018  . Right shoulder pain 08/20/2016  . Knee pain, bilateral 04/23/2015  . Urticaria 10/13/2014  . Neuropathy 10/13/2014  . Fatigue 10/13/2014  . Abrasion of face 08/01/2013  . Rib pain on left side 07/25/2013  . Tinea cruris 06/22/2012  . Eustachian tube dysfunction 04/07/2012  . Cerumen impaction 04/07/2012  . URI 10/30/2010  . CHANGE IN BOWELS 04/03/2010  . FECAL OCCULT BLOOD 04/03/2010  . PERSONAL HX COLONIC POLYPS 04/03/2010  . DYSPEPSIA 03/04/2010  . GUAIAC POSITIVE STOOL 03/04/2010  . HEMATURIA UNSPECIFIED 06/28/2009  . DYSURIA 06/28/2009  . HAMMER TOE 12/28/2008  . POSTMENOPAUSAL  STATUS 12/28/2008  . BACK PAIN, LUMBAR 07/11/2008  . BENIGN NEOPLASM OF SKIN SITE UNSPECIFIED 02/28/2008  . Hyperlipidemia 04/15/2007  . Essential hypertension 04/15/2007  . DIVERTICULOSIS, COLON 04/15/2007  . THUMB PAIN, RIGHT 04/15/2007  . OSTEOPOROSIS 04/15/2007   Past Medical History:  Diagnosis Date  . Adenomatous colon polyp   . Cancer ([place]) 03/2015   BCC R tibia   . Diverticulosis of colon   . GERD (gastroesophageal reflux disease)   . Hip pain   . Hyperlipidemia   . Hyperplastic colon polyp   . Hypertension   . Lactose intolerance   . Loose stools   . Lower back pain   . Osteoporosis   . Palpitations     Family History  Problem Relation Age of Onset  . Hypertension Mother   . Cancer Mother 26       ?  Marland Kitchen Hyperlipidemia Mother   . Obesity Mother   . Hypertension Father   . Stroke Father   . Heart disease Father   . Hypertension Brother   . Testicular cancer Brother 12  . Hypertension Brother   . Heart disease Brother        quadruple bypass  . Colon cancer Neg Hx   . Stomach cancer Neg Hx   . Throat cancer Neg Hx     Past Surgical History:  Procedure Laterality Date  . EYE SURGERY  2012   B/L 01/2011  02/2011  . HAMMER TOE SURGERY     Bilateral  . ROTATOR CUFF REPAIR     Left  . TUBAL LIGATION     Social History   Occupational History  . Occupation: retired--[employer]  Tobacco Use  . Smoking status: Former Smoker    Packs/day: 0.50    Years: 30.00    Pack years: 15.00    Types: Cigarettes    Quit date: 06/02/1991    Years since quitting: 28.7  . Smokeless tobacco: Never Used  Substance and Sexual Activity  . Alcohol use: Yes    Alcohol/week: 5.0 standard drinks    Types: 5 Glasses of wine per week  . Drug use: No  . Sexual activity: Not Currently    Partners: Male

## 2020-02-29 NOTE — ED Provider Notes (Addendum)
I received call from home health requesting a prescription for a wheelchair.  Due to patient's diagnosis: LEFT TIBIAL SPINE FRACTURE, she is unable to use crutches or a walker.  She will need a wheelchair to get around her home, this is not to be propelled by her. Patient will need wheelchair to perform her ADLs.    Portions of this note were generated with Lobbyist. Dictation errors may occur despite best attempts at proofreading.     Cindy Fitting, PA-C 02/29/20 [place], Onalee Steinbach, PA-C 02/29/20 [place], Rudy, DO 02/29/20 1750

## 2020-03-19 ENCOUNTER — Ambulatory Visit (INDEPENDENT_AMBULATORY_CARE_PROVIDER_SITE_OTHER): Payer: Medicare HMO

## 2020-03-19 ENCOUNTER — Other Ambulatory Visit: Payer: Self-pay

## 2020-03-19 ENCOUNTER — Encounter: Payer: Self-pay | Admitting: Orthopaedic Surgery

## 2020-03-19 ENCOUNTER — Ambulatory Visit: Payer: Medicare HMO | Admitting: Orthopaedic Surgery

## 2020-03-19 DIAGNOSIS — S82115A Nondisplaced fracture of left tibial spine, initial encounter for closed fracture: Secondary | ICD-10-CM

## 2020-03-19 NOTE — Progress Notes (Signed)
The patient is 4 weeks status post an injury to her left knee in which she sustained a nondisplaced fracture of the tibial eminence.  She has been nonweightbearing on the left knee wearing a knee brace.  She is an active 79 year old female.  On examination of her left knee there is only mild effusion at this standpoint.  Her extensor mechanism is intact and she can flex her knee to about 90 degrees.  It is painful though.  2 views of the left knee are obtained and show that the fracture line is still slightly visible but it remains nondisplaced.  At this point I will have her try straight leg raises and work on flexing her knee further.  She can try just a knee sleeve at this standpoint and can attempt some weightbearing as tolerated.  I would like to see her back in 4 weeks with a final AP and lateral of the left knee.  All questions and concerns were answered and addressed.

## 2020-04-16 ENCOUNTER — Ambulatory Visit (INDEPENDENT_AMBULATORY_CARE_PROVIDER_SITE_OTHER): Payer: Medicare HMO

## 2020-04-16 ENCOUNTER — Other Ambulatory Visit: Payer: Self-pay

## 2020-04-16 ENCOUNTER — Encounter: Payer: Self-pay | Admitting: Orthopaedic Surgery

## 2020-04-16 ENCOUNTER — Ambulatory Visit: Payer: Medicare HMO | Admitting: Orthopaedic Surgery

## 2020-04-16 DIAGNOSIS — S82115A Nondisplaced fracture of left tibial spine, initial encounter for closed fracture: Secondary | ICD-10-CM | POA: Diagnosis not present

## 2020-04-16 NOTE — Progress Notes (Signed)
The patient comes in today now 8 weeks out from a nondisplaced tibial eminence fracture of her left knee.  She is 79 years old.  She has been just wearing a knee sleeve.  She has been hesitant to put weight on her left knee.  She has had swelling in her left foot and ankle but does dissipate when she is off of it.  She denies any calf pain.  On examination of her left knee her extensor mechanism is intact.  She can easily flex and extend her knee with minimal discomfort.  Her calf is soft on the left side.  When I hold her leg up above the heart level the swelling and color changes in her ankle subside.  2 views of the left knee show the fracture looks like it is healed of the tibial eminence.  There is no knee joint effusion or malalignment of the knee.  This point I gave her reassurance that she is doing well.  I recommended compressive hose.  She can weight-bear as tolerated.  I did give her a prescription for a rolling walker if needed.  We can see her back in 4 weeks to see how she is doing overall but no x-rays are needed.  If she would like to have formal physical therapy set up to work on her balance and coordination gait training she will let us know.

## 2020-04-17 ENCOUNTER — Telehealth: Payer: Self-pay | Admitting: Orthopaedic Surgery

## 2020-04-17 NOTE — Telephone Encounter (Signed)
Patient called requesting new rolling walker. Patient states she would like the walker with the seat. Patient states she would like to walk and then be able to sit. Patient states she would like to build herself back up to walking as exercise but might need rest in between stops. Patient has [place] ( [facility]) might be new name script can be faxed to if approved and new script written. Fax number is [phone]. Patient asked for a call back at [phone].

## 2020-04-18 ENCOUNTER — Telehealth: Payer: Self-pay | Admitting: Orthopaedic Surgery

## 2020-04-18 NOTE — Telephone Encounter (Signed)
LMOM for patient letting her know I faxed Rx to the number she left me

## 2020-04-18 NOTE — Telephone Encounter (Signed)
Patient called again.   Refer to last telephone encounter. She is still waiting to hear from [place].   Call back: [phone]

## 2020-04-26 DIAGNOSIS — S82201A Unspecified fracture of shaft of right tibia, initial encounter for closed fracture: Secondary | ICD-10-CM | POA: Diagnosis not present

## 2020-05-14 ENCOUNTER — Ambulatory Visit: Payer: Medicare HMO | Admitting: Orthopaedic Surgery

## 2020-05-15 ENCOUNTER — Telehealth: Payer: Self-pay

## 2020-05-15 DIAGNOSIS — I10 Essential (primary) hypertension: Secondary | ICD-10-CM

## 2020-05-15 MED ORDER — LISINOPRIL 20 MG PO TABS: 20.0000 mg | ORAL_TABLET | Freq: Every day | ORAL | 0 refills | Status: DC

## 2020-05-15 NOTE — Telephone Encounter (Signed)
Patient called in to get a prescription refill for lisinopril (ZESTRIL) 20 MG tablet [[id number]]    Please send it to [id number] [address]  [place], [place] [address]  Phone:  [phone] Fax:  [phone]  DEA #:  [id number]

## 2020-05-15 NOTE — Telephone Encounter (Signed)
Refill sent.

## 2020-05-30 ENCOUNTER — Telehealth: Payer: Self-pay | Admitting: Orthopaedic Surgery

## 2020-05-31 ENCOUNTER — Other Ambulatory Visit: Payer: Self-pay

## 2020-05-31 ENCOUNTER — Telehealth: Payer: Self-pay | Admitting: Orthopaedic Surgery

## 2020-05-31 DIAGNOSIS — S82115A Nondisplaced fracture of left tibial spine, initial encounter for closed fracture: Secondary | ICD-10-CM

## 2020-05-31 NOTE — Telephone Encounter (Signed)
Referral sent 

## 2020-05-31 NOTE — Telephone Encounter (Signed)
Patient called asked if she can be referred to PT. Patient want to be referred to [facility])  The number to contact Mr. Riki Sheer is [phone]   The number to contact patient is [phone]

## 2020-06-04 NOTE — Telephone Encounter (Signed)
E 

## 2020-06-13 ENCOUNTER — Ambulatory Visit: Payer: Medicare HMO | Attending: Orthopaedic Surgery | Admitting: Physical Therapy

## 2020-06-13 ENCOUNTER — Encounter: Payer: Self-pay | Admitting: Physical Therapy

## 2020-06-13 ENCOUNTER — Other Ambulatory Visit: Payer: Self-pay

## 2020-06-13 DIAGNOSIS — M25562 Pain in left knee: Secondary | ICD-10-CM

## 2020-06-13 DIAGNOSIS — R262 Difficulty in walking, not elsewhere classified: Secondary | ICD-10-CM | POA: Insufficient documentation

## 2020-06-13 DIAGNOSIS — M25662 Stiffness of left knee, not elsewhere classified: Secondary | ICD-10-CM | POA: Diagnosis not present

## 2020-06-13 NOTE — Therapy (Signed)
[place], [place], [address] Phone: [phone]   Fax:  [phone]  Physical Therapy Evaluation  Patient Details  Name: Cindy Velasquez MRN: [id number] Date of Birth: Sep 13, 1941 Referring Provider (PT): Ninfa Linden   Encounter Date: 06/13/2020   PT End of Session - 06/13/20 1745    Visit Number 1    Date for PT Re-Evaluation 07/25/20    Authorization Type [employer]    PT Start Time 1520    PT Stop Time 1600    PT Time Calculation (min) 40 min    Activity Tolerance Patient tolerated treatment well    Behavior During Therapy [facility] for tasks assessed/performed           Past Medical History:  Diagnosis Date  . Adenomatous colon polyp   . Cancer ([place]) 03/2015   BCC R tibia   . Diverticulosis of colon   . GERD (gastroesophageal reflux disease)   . Hip pain   . Hyperlipidemia   . Hyperplastic colon polyp   . Hypertension   . Lactose intolerance   . Loose stools   . Lower back pain   . Osteoporosis   . Palpitations     Past Surgical History:  Procedure Laterality Date  . EYE SURGERY  2012   B/L 01/2011  02/2011  . HAMMER TOE SURGERY     Bilateral  . ROTATOR CUFF REPAIR     Left  . TUBAL LIGATION      There were no vitals filed for this visit.    Subjective Assessment - 06/13/20 1526    Subjective Patient had a fall and sustained a left tibial tubercle fracture February 16, 2020.  She was placed in a brace and was in a w/c for about 5 weeks.  She started using the walker in May and was allowed to bear weight  She reports she went on a vacation in June, she feels like she really has not progressed well.  She reports that she still has pain, is not walking well and feels off balance.    Limitations Lifting;Standing;Walking;House hold activities    Patient Stated Goals have less pain, walk better without device, feel more stable.    Currently in Pain? Yes    Pain Score 1     Pain Location Knee     Pain Orientation Left;Medial    Pain Descriptors / Indicators Tightness;Sore    Pain Type Acute pain    Pain Onset More than a month ago    Pain Frequency Constant    Aggravating Factors  patient is unsure of what causes the pain , reports more activity will increase the pain pain up to 7/10    Pain Relieving Factors rest  pain at best a 1-2/10    Effect of Pain on Daily Activities reports that  she is afraid to walk much, difficulty with standing              [facility] PT Assessment - 06/13/20 0001      Assessment   Medical Diagnosis s/p left tibia fracture    Referring Provider (PT) Ninfa Linden    Onset Date/Surgical Date 02/16/20    Prior Therapy no      Precautions   Precautions None      Balance Screen   Has the patient fallen in the past 6 months Yes    How many times? 1    Has the patient had a decrease  in activity level because of a fear of falling?  No    Is the patient reluctant to leave their home because of a fear of falling?  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, wome walking    Leisure going to the [employer] 2-3 x/week, chair yoga and other classes      ROM / Strength   AROM / PROM / Strength AROM;Strength      AROM   AROM Assessment Site Knee    Right/Left Knee Left    Left Knee Extension 0    Left Knee Flexion 95      Strength   Strength Assessment Site Knee    Right/Left Knee Left    Left Knee Flexion 4-/5    Left Knee Extension 4-/5      Palpation   Palpation comment some crepitus around the patells, non tender      Ambulation/Gait   Gait Comments uses a FWW for most distancs, reports if she is in the home she uses furntiture due to feeling unsteady, left toe is turned out some, does have antalgic gait with mid stance and at toe off       Standardized Balance Assessment   Standardized Balance Assessment Timed Up and Go Test      Timed Up and Go Test   Normal TUG (seconds) 26                      Objective measurements completed on examination: See above findings.       [facility] Adult PT Treatment/Exercise - 06/13/20 0001      Exercises   Exercises Knee/Hip      Knee/Hip Exercises: Aerobic   Nustep level 3 x 5 minutes                    PT Short Term Goals - 06/13/20 1750      PT SHORT TERM GOAL #1   Title independent with initial HEP    Time 2    Period Weeks    Status New             PT Long Term Goals - 06/13/20 1750      PT LONG TERM GOAL #1   Title walk without device with minimal to no deviation    Time 8    Period Weeks    Status New      PT LONG TERM GOAL #2   Title increase left knee AROM to 115 degrees flexion    Time 8    Period Weeks    Status New      PT LONG TERM GOAL #3   Title decrease pain 50%    Time 8    Period Weeks    Status New      PT LONG TERM GOAL #4   Title go up and down steps step over step    Time 8    Period Weeks    Status New                  Plan - 06/13/20 1746    Clinical Impression Statement Patient fell and sustained a left tibial plateau fracture in March,  she was put in a brace and in a w/c and was NWB for about 5 weeks, she reports that she had some home PT for a few visits and did "okay", she reports that she feels tha tshe is not able to walk, get up from sitting or bend the knee well, she does have some limitations in her ROM, the biggest issue is her gait, she is using a walker, toe out gait, and a stiff leg, she does not bend the knee at toe off just hikes the hip some and with the ER she dies not have to circumduct, she does report pain with weight bearing a 5-6/10    Stability/Clinical Decision Making Evolving/Moderate complexity    Clinical Decision Making Moderate    Rehab Potential Good    PT Frequency 2x /  week    PT Duration 8 weeks    PT Treatment/Interventions ADLs/Self Care Home Management;Cryotherapy;Electrical Stimulation;Gait training;Stair training;Functional mobility training;Therapeutic activities;Therapeutic exercise;Balance training;Neuromuscular re-education;Manual techniques;Vasopneumatic Device;Patient/family education    PT Next Visit Plan start gym activities, work on gait    Consulted and Agree with Plan of Care Patient           Patient will benefit from skilled therapeutic intervention in order to improve the following deficits and impairments:  Abnormal gait, Decreased range of motion, Difficulty walking, Cardiopulmonary status limiting activity, Decreased activity tolerance, Pain, Decreased balance, Impaired flexibility, Decreased strength, Decreased mobility  Visit Diagnosis: Acute pain of left knee - Plan: PT plan of care cert/re-cert  Stiffness of left knee, not elsewhere classified - Plan: PT plan of care cert/re-cert  Difficulty in walking, not elsewhere classified - Plan: PT plan of care cert/re-cert     Problem List Patient Active Problem List   Diagnosis Date Noted  . Prediabetes 12/13/2018  . Vitamin D deficiency 12/13/2018  . Overweight (BMI 25.0-29.9) 12/13/2018  . Preventative health care 09/15/2018  . Bilateral cold feet 06/17/2018  . Idiopathic peripheral neuropathy 06/17/2018  . Lower extremity edema 06/17/2018  . Right shoulder pain 08/20/2016  . Knee pain, bilateral 04/23/2015  . Urticaria 10/13/2014  . Neuropathy 10/13/2014  . Fatigue 10/13/2014  . Abrasion of face 08/01/2013  . Rib pain on left side 07/25/2013  . Tinea cruris 06/22/2012  . Eustachian tube dysfunction 04/07/2012  . Cerumen impaction 04/07/2012  . URI 10/30/2010  . CHANGE IN BOWELS 04/03/2010  . FECAL OCCULT BLOOD 04/03/2010  . PERSONAL HX COLONIC POLYPS 04/03/2010  . DYSPEPSIA 03/04/2010  . GUAIAC POSITIVE STOOL 03/04/2010  . HEMATURIA UNSPECIFIED 06/28/2009  .  DYSURIA 06/28/2009  . HAMMER TOE 12/28/2008  . POSTMENOPAUSAL STATUS 12/28/2008  . BACK PAIN, LUMBAR 07/11/2008  . BENIGN NEOPLASM OF SKIN SITE UNSPECIFIED 02/28/2008  . Hyperlipidemia 04/15/2007  . Essential hypertension 04/15/2007  . DIVERTICULOSIS, COLON 04/15/2007  . THUMB PAIN, RIGHT 04/15/2007  . OSTEOPOROSIS 04/15/2007    Sumner Boast., PT 06/13/2020, 6:00 PM  [place], [place], [address] Phone: [phone]   Fax:  [phone]  Name: GISELLE BRUTUS MRN: [id number] Date of Birth: [birth year]

## 2020-06-20 ENCOUNTER — Ambulatory Visit: Payer: Medicare HMO | Admitting: Physical Therapy

## 2020-06-20 ENCOUNTER — Encounter: Payer: Self-pay | Admitting: Physical Therapy

## 2020-06-20 ENCOUNTER — Other Ambulatory Visit: Payer: Self-pay

## 2020-06-20 DIAGNOSIS — M25562 Pain in left knee: Secondary | ICD-10-CM

## 2020-06-20 DIAGNOSIS — R262 Difficulty in walking, not elsewhere classified: Secondary | ICD-10-CM

## 2020-06-20 DIAGNOSIS — M25662 Stiffness of left knee, not elsewhere classified: Secondary | ICD-10-CM

## 2020-06-20 NOTE — Therapy (Signed)
[place], [place], [address] Phone: [phone]   Fax:  [phone]  Physical Therapy Treatment  Patient Details  Name: Cindy Velasquez MRN: [id number] Date of Birth: [birth year] Referring Provider (PT): Ninfa Linden   Encounter Date: 06/20/2020   PT End of Session - 06/20/20 1308    Visit Number 2    Date for PT Re-Evaluation 07/25/20    Authorization Type [employer]    PT Start Time 1100    PT Stop Time 1145    PT Time Calculation (min) 45 min    Activity Tolerance Patient tolerated treatment well    Behavior During Therapy [facility] for tasks assessed/performed           Past Medical History:  Diagnosis Date  . Adenomatous colon polyp   . Cancer ([place]) 03/2015   BCC R tibia   . Diverticulosis of colon   . GERD (gastroesophageal reflux disease)   . Hip pain   . Hyperlipidemia   . Hyperplastic colon polyp   . Hypertension   . Lactose intolerance   . Loose stools   . Lower back pain   . Osteoporosis   . Palpitations     Past Surgical History:  Procedure Laterality Date  . EYE SURGERY  2012   B/L 01/2011  02/2011  . HAMMER TOE SURGERY     Bilateral  . ROTATOR CUFF REPAIR     Left  . TUBAL LIGATION      There were no vitals filed for this visit.   Subjective Assessment - 06/20/20 1055    Subjective Pt reports she is doing well today; has been working on L heel strike. Reports she is a little sore after a very busy day yesterday.    Currently in Pain? Yes    Pain Score 3     Pain Location Knee    Pain Orientation Left;Medial                             [facility] Adult PT Treatment/Exercise - 06/20/20 0001      Ambulation/Gait   Gait Comments gait x400' with and without RW      Knee/Hip Exercises: Aerobic   Recumbent Bike x 6 min    Nustep L5 x 6 min      Knee/Hip Exercises: Standing   Heel Raises Both;1 set;15 reps    Knee Flexion Both;1 set;10 reps    Knee  Flexion Limitations 2#    Hip Flexion Both;1 set;10 reps    Hip Flexion Limitations 2#    Hip Abduction Both;1 set;10 reps    Abduction Limitations 2#    Hip Extension Both;1 set;10 reps    Extension Limitations 2#    Lateral Step Up Both;1 set;10 reps;Hand Hold: 1;Step Height: 4"    Forward Step Up Both;1 set;10 reps;Hand Hold: 1;Step Height: 4"    Other Standing Knee Exercises alt step taps to 4" stair 1x10 HH +1, 1x10 no HH with CGA    Other Standing Knee Exercises sidestepping and fwd/bkwd stepping x1 min each with occasional HH assist      Knee/Hip Exercises: Seated   Sit to Sand 10 reps;without UE support   with RLE elevated                   PT Short Term Goals - 06/13/20 1750      PT SHORT  TERM GOAL #1   Title independent with initial HEP    Time 2    Period Weeks    Status New             PT Long Term Goals - 06/13/20 1750      PT LONG TERM GOAL #1   Title walk without device with minimal to no deviation    Time 8    Period Weeks    Status New      PT LONG TERM GOAL #2   Title increase left knee AROM to 115 degrees flexion    Time 8    Period Weeks    Status New      PT LONG TERM GOAL #3   Title decrease pain 50%    Time 8    Period Weeks    Status New      PT LONG TERM GOAL #4   Title go up and down steps step over step    Time 8    Period Weeks    Status New                 Plan - 06/20/20 1309    Clinical Impression Statement Pt tolerated intitial progression to TE well; mild complaints of increased L knee pain with weightbearing exercises on L knee and STS to chair. Gait with and without RW; gait with RW much improved with more equal WB. Gait without RW cues for knee flexion, equal WB, and reciprocal arm swing. Continue to progress.    PT Treatment/Interventions ADLs/Self Care Home Management;Cryotherapy;Electrical Stimulation;Gait training;Stair training;Functional mobility training;Therapeutic activities;Therapeutic  exercise;Balance training;Neuromuscular re-education;Manual techniques;Vasopneumatic Device;Patient/family education    PT Next Visit Plan start gym activities, work on gait    Consulted and Agree with Plan of Care Patient           Patient will benefit from skilled therapeutic intervention in order to improve the following deficits and impairments:  Abnormal gait, Decreased range of motion, Difficulty walking, Cardiopulmonary status limiting activity, Decreased activity tolerance, Pain, Decreased balance, Impaired flexibility, Decreased strength, Decreased mobility  Visit Diagnosis: Acute pain of left knee  Stiffness of left knee, not elsewhere classified  Difficulty in walking, not elsewhere classified     Problem List Patient Active Problem List   Diagnosis Date Noted  . Prediabetes 12/13/2018  . Vitamin D deficiency 12/13/2018  . Overweight (BMI 25.0-29.9) 12/13/2018  . Preventative health care 09/15/2018  . Bilateral cold feet 06/17/2018  . Idiopathic peripheral neuropathy 06/17/2018  . Lower extremity edema 06/17/2018  . Right shoulder pain 08/20/2016  . Knee pain, bilateral 04/23/2015  . Urticaria 10/13/2014  . Neuropathy 10/13/2014  . Fatigue 10/13/2014  . Abrasion of face 08/01/2013  . Rib pain on left side 07/25/2013  . Tinea cruris 06/22/2012  . Eustachian tube dysfunction 04/07/2012  . Cerumen impaction 04/07/2012  . URI 10/30/2010  . CHANGE IN BOWELS 04/03/2010  . FECAL OCCULT BLOOD 04/03/2010  . PERSONAL HX COLONIC POLYPS 04/03/2010  . DYSPEPSIA 03/04/2010  . GUAIAC POSITIVE STOOL 03/04/2010  . HEMATURIA UNSPECIFIED 06/28/2009  . DYSURIA 06/28/2009  . HAMMER TOE 12/28/2008  . POSTMENOPAUSAL STATUS 12/28/2008  . BACK PAIN, LUMBAR 07/11/2008  . BENIGN NEOPLASM OF SKIN SITE UNSPECIFIED 02/28/2008  . Hyperlipidemia 04/15/2007  . Essential hypertension 04/15/2007  . DIVERTICULOSIS, COLON 04/15/2007  . THUMB PAIN, RIGHT 04/15/2007  . OSTEOPOROSIS  04/15/2007   Amador Cunas, PT, DPT Donald Prose Jessikah Dicker 06/20/2020, 1:11 PM  [facility]  [place], [place], [address] Phone: [phone]   Fax:  [phone]  Name: Cindy Velasquez MRN: [id number] Date of Birth: 29-May-1941

## 2020-06-26 ENCOUNTER — Ambulatory Visit: Payer: Medicare HMO | Admitting: Physical Therapy

## 2020-06-26 ENCOUNTER — Other Ambulatory Visit: Payer: Self-pay

## 2020-06-26 ENCOUNTER — Encounter: Payer: Self-pay | Admitting: Physical Therapy

## 2020-06-26 DIAGNOSIS — M25562 Pain in left knee: Secondary | ICD-10-CM | POA: Diagnosis not present

## 2020-06-26 DIAGNOSIS — R262 Difficulty in walking, not elsewhere classified: Secondary | ICD-10-CM

## 2020-06-26 DIAGNOSIS — M25662 Stiffness of left knee, not elsewhere classified: Secondary | ICD-10-CM

## 2020-06-26 NOTE — Therapy (Signed)
[place], [place], [address] Phone: [phone]   Fax:  [phone]  Physical Therapy Treatment  Patient Details  Name: Cindy Velasquez MRN: [id number] Date of Birth: Nov 18, 1941 Referring Provider (PT): Ninfa Linden   Encounter Date: 06/26/2020   PT End of Session - 06/26/20 1148    Visit Number 3    Date for PT Re-Evaluation 07/25/20    Authorization Type [employer]    PT Start Time 1100    PT Stop Time 1144    PT Time Calculation (min) 44 min    Activity Tolerance Patient tolerated treatment well    Behavior During Therapy [facility] for tasks assessed/performed           Past Medical History:  Diagnosis Date  . Adenomatous colon polyp   . Cancer ([place]) 03/2015   BCC R tibia   . Diverticulosis of colon   . GERD (gastroesophageal reflux disease)   . Hip pain   . Hyperlipidemia   . Hyperplastic colon polyp   . Hypertension   . Lactose intolerance   . Loose stools   . Lower back pain   . Osteoporosis   . Palpitations     Past Surgical History:  Procedure Laterality Date  . EYE SURGERY  2012   B/L 01/2011  02/2011  . HAMMER TOE SURGERY     Bilateral  . ROTATOR CUFF REPAIR     Left  . TUBAL LIGATION      There were no vitals filed for this visit.   Subjective Assessment - 06/26/20 1101    Subjective Pt ambulated into clinic with [facility] today; wants to learn how to properly use it and adjust height. States pain is much better today.    Currently in Pain? Yes    Pain Score 3     Pain Location Knee    Pain Orientation Left                             [facility] Adult PT Treatment/Exercise - 06/26/20 0001      Ambulation/Gait   Stairs Yes    Stairs Assistance 6: Modified independent (Device/Increase time)    Stair Management Technique One rail Left;Step to pattern;Alternating pattern   step to descending/alternating ascending   Number of Stairs 13    Height of Stairs 6     Gait Comments gait >1070ft outside over level/unlevel surfaces and curbs       Knee/Hip Exercises: Stretches   Gastroc Stretch Both;1 rep;30 seconds      Knee/Hip Exercises: Aerobic   Recumbent Bike x 6 min    Nustep L5 x 6 min      Knee/Hip Exercises: Machines for Strengthening   Cybex Knee Extension 5# 2x10    Cybex Knee Flexion 10# 2x10      Knee/Hip Exercises: Standing   Heel Raises Both;1 set;15 reps                    PT Short Term Goals - 06/26/20 1151      PT SHORT TERM GOAL #1   Title independent with initial HEP    Time 2    Period Weeks    Status Achieved             PT Long Term Goals - 06/13/20 1750      PT LONG TERM GOAL #1   Title  walk without device with minimal to no deviation    Time 8    Period Weeks    Status New      PT LONG TERM GOAL #2   Title increase left knee AROM to 115 degrees flexion    Time 8    Period Weeks    Status New      PT LONG TERM GOAL #3   Title decrease pain 50%    Time 8    Period Weeks    Status New      PT LONG TERM GOAL #4   Title go up and down steps step over step    Time 8    Period Weeks    Status New                 Plan - 06/26/20 1148    Clinical Impression Statement Pt tolerated progression of TE well; no complaints of increased L knee pain with exercise. Ambulation outside over level/unlevel surfaces, up/down hill, curbs; cuing for knee flexion and equal WB. Pt able to navigate with no LOB; educated pt on transitioning to [facility] and using RW for longer distances. Pt able to complete stairs with step to descending and step over step ascending with supervision assist.    PT Treatment/Interventions ADLs/Self Care Home Management;Cryotherapy;Electrical Stimulation;Gait training;Stair training;Functional mobility training;Therapeutic activities;Therapeutic exercise;Balance training;Neuromuscular re-education;Manual techniques;Vasopneumatic Device;Patient/family education    PT Next Visit Plan  start gym activities, work on gait    Consulted and Agree with Plan of Care Patient           Patient will benefit from skilled therapeutic intervention in order to improve the following deficits and impairments:  Abnormal gait, Decreased range of motion, Difficulty walking, Cardiopulmonary status limiting activity, Decreased activity tolerance, Pain, Decreased balance, Impaired flexibility, Decreased strength, Decreased mobility  Visit Diagnosis: Acute pain of left knee  Stiffness of left knee, not elsewhere classified  Difficulty in walking, not elsewhere classified     Problem List Patient Active Problem List   Diagnosis Date Noted  . Prediabetes 12/13/2018  . Vitamin D deficiency 12/13/2018  . Overweight (BMI 25.0-29.9) 12/13/2018  . Preventative health care 09/15/2018  . Bilateral cold feet 06/17/2018  . Idiopathic peripheral neuropathy 06/17/2018  . Lower extremity edema 06/17/2018  . Right shoulder pain 08/20/2016  . Knee pain, bilateral 04/23/2015  . Urticaria 10/13/2014  . Neuropathy 10/13/2014  . Fatigue 10/13/2014  . Abrasion of face 08/01/2013  . Rib pain on left side 07/25/2013  . Tinea cruris 06/22/2012  . Eustachian tube dysfunction 04/07/2012  . Cerumen impaction 04/07/2012  . URI 10/30/2010  . CHANGE IN BOWELS 04/03/2010  . FECAL OCCULT BLOOD 04/03/2010  . PERSONAL HX COLONIC POLYPS 04/03/2010  . DYSPEPSIA 03/04/2010  . GUAIAC POSITIVE STOOL 03/04/2010  . HEMATURIA UNSPECIFIED 06/28/2009  . DYSURIA 06/28/2009  . HAMMER TOE 12/28/2008  . POSTMENOPAUSAL STATUS 12/28/2008  . BACK PAIN, LUMBAR 07/11/2008  . BENIGN NEOPLASM OF SKIN SITE UNSPECIFIED 02/28/2008  . Hyperlipidemia 04/15/2007  . Essential hypertension 04/15/2007  . DIVERTICULOSIS, COLON 04/15/2007  . THUMB PAIN, RIGHT 04/15/2007  . OSTEOPOROSIS 04/15/2007   Amador Cunas, PT, DPT Donald Prose Sallie Maker 06/26/2020, 11:52 AM  [place], [place], [address] Phone: [phone]   Fax:  [phone]  Name: Cindy Velasquez MRN: [id number] Date of Birth: [birth year]

## 2020-06-28 ENCOUNTER — Telehealth: Payer: Self-pay | Admitting: *Deleted

## 2020-06-28 ENCOUNTER — Encounter: Payer: Self-pay | Admitting: Physical Therapy

## 2020-06-28 ENCOUNTER — Ambulatory Visit: Payer: Medicare HMO | Admitting: Physical Therapy

## 2020-06-28 ENCOUNTER — Other Ambulatory Visit: Payer: Self-pay

## 2020-06-28 DIAGNOSIS — R262 Difficulty in walking, not elsewhere classified: Secondary | ICD-10-CM | POA: Diagnosis not present

## 2020-06-28 DIAGNOSIS — M25562 Pain in left knee: Secondary | ICD-10-CM | POA: Diagnosis not present

## 2020-06-28 DIAGNOSIS — I10 Essential (primary) hypertension: Secondary | ICD-10-CM

## 2020-06-28 DIAGNOSIS — M25662 Stiffness of left knee, not elsewhere classified: Secondary | ICD-10-CM

## 2020-06-28 MED ORDER — LISINOPRIL 20 MG PO TABS: 20.0000 mg | ORAL_TABLET | Freq: Every day | ORAL | 3 refills | Status: DC

## 2020-06-28 NOTE — Therapy (Signed)
[place], [place], [address] Phone: [phone]   Fax:  [phone]  Physical Therapy Treatment  Patient Details  Name: Cindy Velasquez MRN: [id number] Date of Birth: 1941/06/08 Referring Provider (PT): Ninfa Linden   Encounter Date: 06/28/2020   PT End of Session - 06/28/20 1153    Visit Number 4    Date for PT Re-Evaluation 07/25/20    Authorization Type [employer]    PT Start Time 1055    PT Stop Time 1142    PT Time Calculation (min) 47 min    Activity Tolerance Patient tolerated treatment well    Behavior During Therapy [facility] for tasks assessed/performed           Past Medical History:  Diagnosis Date  . Adenomatous colon polyp   . Cancer ([place]) 03/2015   BCC R tibia   . Diverticulosis of colon   . GERD (gastroesophageal reflux disease)   . Hip pain   . Hyperlipidemia   . Hyperplastic colon polyp   . Hypertension   . Lactose intolerance   . Loose stools   . Lower back pain   . Osteoporosis   . Palpitations     Past Surgical History:  Procedure Laterality Date  . EYE SURGERY  2012   B/L 01/2011  02/2011  . HAMMER TOE SURGERY     Bilateral  . ROTATOR CUFF REPAIR     Left  . TUBAL LIGATION      There were no vitals filed for this visit.   Subjective Assessment - 06/28/20 1100    Subjective Patient reports that she is doing well with the cane, she has increased neck and back pain but reports that she has had this for years    Currently in Pain? Yes    Pain Score 2     Pain Location Knee    Pain Orientation Left    Aggravating Factors  bending and weight bearing              [facility] PT Assessment - 06/28/20 0001      AROM   Left Knee Extension 0    Left Knee Flexion 108                         [facility] Adult PT Treatment/Exercise - 06/28/20 0001      Ambulation/Gait   Gait Comments gait down stairs working in step over step with CGA, with SPC, then outside,  some cues to keep toes going straight and some cues for bend, overall so much better.      Knee/Hip Exercises: Aerobic   Recumbent Bike x 6 min    Nustep L5 x 6 min      Knee/Hip Exercises: Machines for Strengthening   Cybex Knee Extension 5# 2x10    Cybex Knee Flexion 10# 2x10    Cybex Leg Press 20# 2x10, no weight as low as she can both legs 2x10, then left only                    PT Short Term Goals - 06/26/20 1151      PT SHORT TERM GOAL #1   Title independent with initial HEP    Time 2    Period Weeks    Status Achieved             PT Long Term Goals - 06/28/20 1156  PT LONG TERM GOAL #1   Title walk without device with minimal to no deviation    Status On-going      PT LONG TERM GOAL #2   Title increase left knee AROM to 115 degrees flexion    Status Partially Met                 Plan - 06/28/20 1153    Clinical Impression Statement Patient doing great, great increase in AROM, walking with SPC, with good bend and much better stance phase still with some ER at the hip and at time will hike hip, she had difficulty going down stairs but with CGA was able to do with c/o tightness    PT Next Visit Plan continue to work on gait and functional strength    Consulted and Agree with Plan of Care Patient           Patient will benefit from skilled therapeutic intervention in order to improve the following deficits and impairments:  Abnormal gait, Decreased range of motion, Difficulty walking, Cardiopulmonary status limiting activity, Decreased activity tolerance, Pain, Decreased balance, Impaired flexibility, Decreased strength, Decreased mobility  Visit Diagnosis: Acute pain of left knee  Stiffness of left knee, not elsewhere classified  Difficulty in walking, not elsewhere classified     Problem List Patient Active Problem List   Diagnosis Date Noted  . Prediabetes 12/13/2018  . Vitamin D deficiency 12/13/2018  . Overweight (BMI  25.0-29.9) 12/13/2018  . Preventative health care 09/15/2018  . Bilateral cold feet 06/17/2018  . Idiopathic peripheral neuropathy 06/17/2018  . Lower extremity edema 06/17/2018  . Right shoulder pain 08/20/2016  . Knee pain, bilateral 04/23/2015  . Urticaria 10/13/2014  . Neuropathy 10/13/2014  . Fatigue 10/13/2014  . Abrasion of face 08/01/2013  . Rib pain on left side 07/25/2013  . Tinea cruris 06/22/2012  . Eustachian tube dysfunction 04/07/2012  . Cerumen impaction 04/07/2012  . URI 10/30/2010  . CHANGE IN BOWELS 04/03/2010  . FECAL OCCULT BLOOD 04/03/2010  . PERSONAL HX COLONIC POLYPS 04/03/2010  . DYSPEPSIA 03/04/2010  . GUAIAC POSITIVE STOOL 03/04/2010  . HEMATURIA UNSPECIFIED 06/28/2009  . DYSURIA 06/28/2009  . HAMMER TOE 12/28/2008  . POSTMENOPAUSAL STATUS 12/28/2008  . BACK PAIN, LUMBAR 07/11/2008  . BENIGN NEOPLASM OF SKIN SITE UNSPECIFIED 02/28/2008  . Hyperlipidemia 04/15/2007  . Essential hypertension 04/15/2007  . DIVERTICULOSIS, COLON 04/15/2007  . THUMB PAIN, RIGHT 04/15/2007  . OSTEOPOROSIS 04/15/2007    Sumner Boast., PT 06/28/2020, 11:57 AM  [place], [place], [address] Phone: [phone]   Fax:  [phone]  Name: Cindy Velasquez MRN: [id number] Date of Birth: 10-25-1941

## 2020-06-28 NOTE — Telephone Encounter (Signed)
Received fax from [facility] for lisinopril 20mg .  Refills sent.

## 2020-07-02 DIAGNOSIS — H10413 Chronic giant papillary conjunctivitis, bilateral: Secondary | ICD-10-CM | POA: Diagnosis not present

## 2020-07-02 DIAGNOSIS — H0102B Squamous blepharitis left eye, upper and lower eyelids: Secondary | ICD-10-CM | POA: Diagnosis not present

## 2020-07-02 DIAGNOSIS — H26493 Other secondary cataract, bilateral: Secondary | ICD-10-CM | POA: Diagnosis not present

## 2020-07-02 DIAGNOSIS — Z961 Presence of intraocular lens: Secondary | ICD-10-CM | POA: Diagnosis not present

## 2020-07-02 DIAGNOSIS — H353132 Nonexudative age-related macular degeneration, bilateral, intermediate dry stage: Secondary | ICD-10-CM | POA: Diagnosis not present

## 2020-07-02 DIAGNOSIS — H43812 Vitreous degeneration, left eye: Secondary | ICD-10-CM | POA: Diagnosis not present

## 2020-07-02 DIAGNOSIS — H0102A Squamous blepharitis right eye, upper and lower eyelids: Secondary | ICD-10-CM | POA: Diagnosis not present

## 2020-07-04 ENCOUNTER — Other Ambulatory Visit: Payer: Self-pay

## 2020-07-04 ENCOUNTER — Encounter: Payer: Self-pay | Admitting: Physical Therapy

## 2020-07-04 ENCOUNTER — Other Ambulatory Visit: Payer: Self-pay | Admitting: Family Medicine

## 2020-07-04 ENCOUNTER — Ambulatory Visit: Payer: Medicare HMO | Admitting: Physical Therapy

## 2020-07-04 DIAGNOSIS — M25562 Pain in left knee: Secondary | ICD-10-CM | POA: Diagnosis not present

## 2020-07-04 DIAGNOSIS — M25662 Stiffness of left knee, not elsewhere classified: Secondary | ICD-10-CM | POA: Diagnosis not present

## 2020-07-04 DIAGNOSIS — R262 Difficulty in walking, not elsewhere classified: Secondary | ICD-10-CM

## 2020-07-04 NOTE — Therapy (Signed)
[place], [place], [address] Phone: [phone]   Fax:  [phone]  Physical Therapy Treatment  Patient Details  Name: Cindy Velasquez MRN: [id number] Date of Birth: 06-Jul-1941 Referring Provider (PT): Ninfa Linden   Encounter Date: 07/04/2020   PT End of Session - 07/04/20 1011    Visit Number 5    Date for PT Re-Evaluation 07/25/20    Authorization Type [employer]    PT Start Time 0931    PT Stop Time 6945    PT Time Calculation (min) 43 min    Activity Tolerance Patient tolerated treatment well    Behavior During Therapy [facility] for tasks assessed/performed           Past Medical History:  Diagnosis Date   Adenomatous colon polyp    Cancer ([place]) 03/2015   BCC R tibia    Diverticulosis of colon    GERD (gastroesophageal reflux disease)    Hip pain    Hyperlipidemia    Hyperplastic colon polyp    Hypertension    Lactose intolerance    Loose stools    Lower back pain    Osteoporosis    Palpitations     Past Surgical History:  Procedure Laterality Date   EYE SURGERY  2012   B/L 01/2011  02/2011   HAMMER TOE SURGERY     Bilateral   ROTATOR CUFF REPAIR     Left   TUBAL LIGATION      There were no vitals filed for this visit.   Subjective Assessment - 07/04/20 0931    Subjective Patient reports that after the last session she has had increased LBP, she thinks it is the new exercises that we did on the machines, also c/o neck pain    Currently in Pain? Yes    Pain Score 7     Pain Location Back    Pain Orientation Lower    Pain Descriptors / Indicators Sore;Tightness    Aggravating Factors  thinks the weight machines increased the low back pain                             [facility] Adult PT Treatment/Exercise - 07/04/20 0001      Ambulation/Gait   Gait Comments gait in the home now is without device, all outside the home walking is with a SPC,       Knee/Hip Exercises: Aerobic   Nustep L5 x 6 min      Knee/Hip Exercises: Supine   Short Arc Quad Sets Left;3 sets;10 reps    Short Arc Quad Sets Limitations 3#    Bridges with [employer] Squeeze Both;2 sets;10 reps    Bridges with Clamshell Both;2 sets;10 reps    Other Supine Knee/Hip Exercises left ankle red tband all motions 2x10      Modalities   Modalities Electrical Stimulation;Moist Heat      Moist Heat Therapy   Number Minutes Moist Heat 15 Minutes    Moist Heat Location Lumbar Spine;Cervical      Electrical Stimulation   Electrical Stimulation Location cervical and lumbar    Electrical Stimulation Action premod    Electrical Stimulation Parameters supine    Electrical Stimulation Goals Pain                    PT Short Term Goals - 06/26/20 1151      PT  SHORT TERM GOAL #1   Title independent with initial HEP    Time 2    Period Weeks    Status Achieved             PT Long Term Goals - 07/04/20 1017      PT LONG TERM GOAL #1   Title walk without device with minimal to no deviation    Status Partially Met      PT LONG TERM GOAL #2   Title increase left knee AROM to 115 degrees flexion    Status Partially Met                 Plan - 07/04/20 1011    Clinical Impression Statement Patient had increased LBP and neck pain after the last treatment, the only different treatment that we did was the stairs, and the leg press, she felt it was the leg press, reports that she has DDD in the neck and lumbar areas, I did most treatment with her lying on the heat and stim since her pain level was a 7/10, tried to continue to increase strength in the knee and the ankle    PT Next Visit Plan be very careful of back and neck, she reports that she will be doing a lot of sitting in the car over the next week and does not want her back hurting.    Consulted and Agree with Plan of Care Patient           Patient will benefit from skilled therapeutic intervention in  order to improve the following deficits and impairments:  Abnormal gait, Decreased range of motion, Difficulty walking, Cardiopulmonary status limiting activity, Decreased activity tolerance, Pain, Decreased balance, Impaired flexibility, Decreased strength, Decreased mobility  Visit Diagnosis: Acute pain of left knee  Stiffness of left knee, not elsewhere classified  Difficulty in walking, not elsewhere classified     Problem List Patient Active Problem List   Diagnosis Date Noted   Prediabetes 12/13/2018   Vitamin D deficiency 12/13/2018   Overweight (BMI 25.0-29.9) 12/13/2018   Preventative health care 09/15/2018   Bilateral cold feet 06/17/2018   Idiopathic peripheral neuropathy 06/17/2018   Lower extremity edema 06/17/2018   Right shoulder pain 08/20/2016   Knee pain, bilateral 04/23/2015   Urticaria 10/13/2014   Neuropathy 10/13/2014   Fatigue 10/13/2014   Abrasion of face 08/01/2013   Rib pain on left side 07/25/2013   Tinea cruris 06/22/2012   Eustachian tube dysfunction 04/07/2012   Cerumen impaction 04/07/2012   URI 10/30/2010   CHANGE IN BOWELS 04/03/2010   FECAL OCCULT BLOOD 04/03/2010   PERSONAL HX COLONIC POLYPS 04/03/2010   DYSPEPSIA 03/04/2010   GUAIAC POSITIVE STOOL 03/04/2010   HEMATURIA UNSPECIFIED 06/28/2009   DYSURIA 06/28/2009   HAMMER TOE 12/28/2008   POSTMENOPAUSAL STATUS 12/28/2008   BACK PAIN, LUMBAR 07/11/2008   BENIGN NEOPLASM OF SKIN SITE UNSPECIFIED 02/28/2008   Hyperlipidemia 04/15/2007   Essential hypertension 04/15/2007   DIVERTICULOSIS, COLON 04/15/2007   THUMB PAIN, RIGHT 04/15/2007   OSTEOPOROSIS 04/15/2007    Sumner Boast., PT 07/04/2020, 10:18 AM  [place], [place], [address] Phone: [phone]   Fax:  [phone]  Name: DAMIAH MCDONALD MRN: [id number] Date of Birth: [birth year]

## 2020-07-06 ENCOUNTER — Encounter: Payer: Self-pay | Admitting: Physical Therapy

## 2020-07-06 ENCOUNTER — Ambulatory Visit: Payer: Medicare HMO | Admitting: Physical Therapy

## 2020-07-06 ENCOUNTER — Other Ambulatory Visit: Payer: Self-pay

## 2020-07-06 DIAGNOSIS — R262 Difficulty in walking, not elsewhere classified: Secondary | ICD-10-CM

## 2020-07-06 DIAGNOSIS — M25562 Pain in left knee: Secondary | ICD-10-CM

## 2020-07-06 DIAGNOSIS — M25662 Stiffness of left knee, not elsewhere classified: Secondary | ICD-10-CM

## 2020-07-06 NOTE — Therapy (Signed)
[place], [place], [address] Phone: [phone]   Fax:  [phone]  Physical Therapy Treatment  Patient Details  Name: Cindy Velasquez MRN: [id number] Date of Birth: 1941-10-09 Referring Provider (PT): Ninfa Linden   Encounter Date: 07/06/2020   PT End of Session - 07/06/20 1133    Visit Number 6    Date for PT Re-Evaluation 07/25/20    Authorization Type [employer]    PT Start Time 1100    PT Stop Time 1146    PT Time Calculation (min) 46 min    Activity Tolerance Patient tolerated treatment well    Behavior During Therapy [facility] for tasks assessed/performed           Past Medical History:  Diagnosis Date  . Adenomatous colon polyp   . Cancer ([place]) 03/2015   BCC R tibia   . Diverticulosis of colon   . GERD (gastroesophageal reflux disease)   . Hip pain   . Hyperlipidemia   . Hyperplastic colon polyp   . Hypertension   . Lactose intolerance   . Loose stools   . Lower back pain   . Osteoporosis   . Palpitations     Past Surgical History:  Procedure Laterality Date  . EYE SURGERY  2012   B/L 01/2011  02/2011  . HAMMER TOE SURGERY     Bilateral  . ROTATOR CUFF REPAIR     Left  . TUBAL LIGATION      There were no vitals filed for this visit.   Subjective Assessment - 07/06/20 1102    Subjective Pt reports that she is still having increased LBP; would like to avoid machine interventions today    Currently in Pain? Yes    Pain Score 6     Pain Location Back                             [facility] Adult PT Treatment/Exercise - 07/06/20 0001      Knee/Hip Exercises: Aerobic   Recumbent Bike L1 x 6 min      Knee/Hip Exercises: Standing   Other Standing Knee Exercises STS with yellow ball chest press x10    Other Standing Knee Exercises sidestepping with red TB; hip ext 2x10 red TB      Knee/Hip Exercises: Supine   Bridges with Ball Squeeze Both;2 sets;10 reps       Moist Heat Therapy   Number Minutes Moist Heat 15 Minutes    Moist Heat Location Lumbar Spine;Cervical      Electrical Stimulation   Electrical Stimulation Location cervical and lumbar    Electrical Stimulation Action IFC    Electrical Stimulation Parameters supine    Electrical Stimulation Goals Pain                    PT Short Term Goals - 06/26/20 1151      PT SHORT TERM GOAL #1   Title independent with initial HEP    Time 2    Period Weeks    Status Achieved             PT Long Term Goals - 07/04/20 1017      PT LONG TERM GOAL #1   Title walk without device with minimal to no deviation    Status Partially Met      PT LONG TERM GOAL #2   Title  increase left knee AROM to 115 degrees flexion    Status Partially Met                 Plan - 07/06/20 1134    Clinical Impression Statement Pt reports continuing LBP and cervical pain; slightly better from last rx. Avoided machine interventions today. Required cues to avoid compensations with standing hip ex's. Pt reported relief with heat and estim.    PT Treatment/Interventions ADLs/Self Care Home Management;Cryotherapy;Electrical Stimulation;Gait training;Stair training;Functional mobility training;Therapeutic activities;Therapeutic exercise;Balance training;Neuromuscular re-education;Manual techniques;Vasopneumatic Device;Patient/family education    PT Next Visit Plan be very careful of back and neck, she reports that she will be doing a lot of sitting in the car over the next week and does not want her back hurting.    Consulted and Agree with Plan of Care Patient           Patient will benefit from skilled therapeutic intervention in order to improve the following deficits and impairments:  Abnormal gait, Decreased range of motion, Difficulty walking, Cardiopulmonary status limiting activity, Decreased activity tolerance, Pain, Decreased balance, Impaired flexibility, Decreased strength, Decreased  mobility  Visit Diagnosis: Acute pain of left knee  Stiffness of left knee, not elsewhere classified  Difficulty in walking, not elsewhere classified     Problem List Patient Active Problem List   Diagnosis Date Noted  . Prediabetes 12/13/2018  . Vitamin D deficiency 12/13/2018  . Overweight (BMI 25.0-29.9) 12/13/2018  . Preventative health care 09/15/2018  . Bilateral cold feet 06/17/2018  . Idiopathic peripheral neuropathy 06/17/2018  . Lower extremity edema 06/17/2018  . Right shoulder pain 08/20/2016  . Knee pain, bilateral 04/23/2015  . Urticaria 10/13/2014  . Neuropathy 10/13/2014  . Fatigue 10/13/2014  . Abrasion of face 08/01/2013  . Rib pain on left side 07/25/2013  . Tinea cruris 06/22/2012  . Eustachian tube dysfunction 04/07/2012  . Cerumen impaction 04/07/2012  . URI 10/30/2010  . CHANGE IN BOWELS 04/03/2010  . FECAL OCCULT BLOOD 04/03/2010  . PERSONAL HX COLONIC POLYPS 04/03/2010  . DYSPEPSIA 03/04/2010  . GUAIAC POSITIVE STOOL 03/04/2010  . HEMATURIA UNSPECIFIED 06/28/2009  . DYSURIA 06/28/2009  . HAMMER TOE 12/28/2008  . POSTMENOPAUSAL STATUS 12/28/2008  . BACK PAIN, LUMBAR 07/11/2008  . BENIGN NEOPLASM OF SKIN SITE UNSPECIFIED 02/28/2008  . Hyperlipidemia 04/15/2007  . Essential hypertension 04/15/2007  . DIVERTICULOSIS, COLON 04/15/2007  . THUMB PAIN, RIGHT 04/15/2007  . OSTEOPOROSIS 04/15/2007   Amador Cunas, PT, DPT Donald Prose Harvin Konicek 07/06/2020, 11:36 AM  [place], [place], [address] Phone: [phone]   Fax:  [phone]  Name: Cindy Velasquez MRN: [id number] Date of Birth: 1941/04/30

## 2020-07-10 ENCOUNTER — Encounter: Payer: Self-pay | Admitting: Physical Therapy

## 2020-07-10 ENCOUNTER — Other Ambulatory Visit: Payer: Self-pay

## 2020-07-10 ENCOUNTER — Ambulatory Visit: Payer: Medicare HMO | Attending: Orthopaedic Surgery | Admitting: Physical Therapy

## 2020-07-10 DIAGNOSIS — R262 Difficulty in walking, not elsewhere classified: Secondary | ICD-10-CM

## 2020-07-10 DIAGNOSIS — G8929 Other chronic pain: Secondary | ICD-10-CM | POA: Insufficient documentation

## 2020-07-10 DIAGNOSIS — M25562 Pain in left knee: Secondary | ICD-10-CM | POA: Diagnosis not present

## 2020-07-10 DIAGNOSIS — M545 Low back pain: Secondary | ICD-10-CM | POA: Diagnosis not present

## 2020-07-10 DIAGNOSIS — M25662 Stiffness of left knee, not elsewhere classified: Secondary | ICD-10-CM | POA: Insufficient documentation

## 2020-07-10 NOTE — Therapy (Signed)
[place], [place], [address] Phone: [phone]   Fax:  [phone]  Physical Therapy Treatment  Patient Details  Name: Cindy Velasquez MRN: [id number] Date of Birth: 1941/07/03 Referring Provider (PT): Ninfa Linden   Encounter Date: 07/10/2020   PT End of Session - 07/10/20 1231    Visit Number 7    Date for PT Re-Evaluation 07/25/20    Authorization Type [employer]    PT Start Time 1015    PT Stop Time 1110    PT Time Calculation (min) 55 min    Activity Tolerance Patient tolerated treatment well    Behavior During Therapy [facility] for tasks assessed/performed           Past Medical History:  Diagnosis Date  . Adenomatous colon polyp   . Cancer ([place]) 03/2015   BCC R tibia   . Diverticulosis of colon   . GERD (gastroesophageal reflux disease)   . Hip pain   . Hyperlipidemia   . Hyperplastic colon polyp   . Hypertension   . Lactose intolerance   . Loose stools   . Lower back pain   . Osteoporosis   . Palpitations     Past Surgical History:  Procedure Laterality Date  . EYE SURGERY  2012   B/L 01/2011  02/2011  . HAMMER TOE SURGERY     Bilateral  . ROTATOR CUFF REPAIR     Left  . TUBAL LIGATION      There were no vitals filed for this visit.   Subjective Assessment - 07/10/20 1014    Subjective Patient reports that she is still having some LBP, feels better than last week but still there    Currently in Pain? Yes    Pain Score 4     Pain Location Back    Pain Orientation Lower    Pain Descriptors / Indicators Sore    Aggravating Factors  weight machines increase LBP                             [facility] Adult PT Treatment/Exercise - 07/10/20 0001      Ambulation/Gait   Gait Comments stairs one at a time and then some step over step up and down      High Level Balance   High Level Balance Comments on airex marching, head turns and eyes closed, she struggles with  this      Knee/Hip Exercises: Aerobic   Nustep L5 x 6 min      Knee/Hip Exercises: Standing   Other Standing Knee Exercises resisted gait all directions      Knee/Hip Exercises: Seated   Long Arc Quad Left;3 sets;10 reps    Long Arc Quad Weight 3 lbs.      Knee/Hip Exercises: Supine   Short Arc Quad Sets Left;3 sets;10 reps    Short Arc Quad Sets Limitations 3#    Bridges with [employer]iona F[employer] Squeeze Both;2 sets;10 reps    Bridges with Clamshell Both;2 sets;10 reps    Other Supine Knee/Hip Exercises left ankle red tband all motions 2x10      Moist Heat Therapy   Number Minutes Moist Heat 12 Minutes    Moist Heat Location Lumbar Spine;Cervical      Electrical Stimulation   Electrical Stimulation Location cervical and lumbar    Electrical Stimulation Action IFC    Electrical Stimulation Parameters supine  Electrical Stimulation Goals Pain                    PT Short Term Goals - 06/26/20 1151      PT SHORT TERM GOAL #1   Title independent with initial HEP    Time 2    Period Weeks    Status Achieved             PT Long Term Goals - 07/10/20 1238      PT LONG TERM GOAL #1   Title walk without device with minimal to no deviation    Status Partially Met      PT LONG TERM GOAL #2   Title increase left knee AROM to 115 degrees flexion    Status Partially Met                 Plan - 07/10/20 1232    Clinical Impression Statement Patient is still have some of the LBP, she says it is long standing but was made worse recently by the weight machines.  She is walking better, made increaes in knee flexion.  Still at times is stiff, struggled ont he stairs, had difficulty witht he airex    PT Next Visit Plan be very careful of back and neck, she reports that she will be doing a lot of sitting in the car over the next week and does not want her back hurting.    Consulted and Agree with Plan of Care Patient           Patient will benefit from skilled therapeutic  intervention in order to improve the following deficits and impairments:  Abnormal gait, Decreased range of motion, Difficulty walking, Cardiopulmonary status limiting activity, Decreased activity tolerance, Pain, Decreased balance, Impaired flexibility, Decreased strength, Decreased mobility  Visit Diagnosis: Acute pain of left knee  Stiffness of left knee, not elsewhere classified  Difficulty in walking, not elsewhere classified  Chronic bilateral low back pain without sciatica     Problem List Patient Active Problem List   Diagnosis Date Noted  . Prediabetes 12/13/2018  . Vitamin D deficiency 12/13/2018  . Overweight (BMI 25.0-29.9) 12/13/2018  . Preventative health care 09/15/2018  . Bilateral cold feet 06/17/2018  . Idiopathic peripheral neuropathy 06/17/2018  . Lower extremity edema 06/17/2018  . Right shoulder pain 08/20/2016  . Knee pain, bilateral 04/23/2015  . Urticaria 10/13/2014  . Neuropathy 10/13/2014  . Fatigue 10/13/2014  . Abrasion of face 08/01/2013  . Rib pain on left side 07/25/2013  . Tinea cruris 06/22/2012  . Eustachian tube dysfunction 04/07/2012  . Cerumen impaction 04/07/2012  . URI 10/30/2010  . CHANGE IN BOWELS 04/03/2010  . FECAL OCCULT BLOOD 04/03/2010  . PERSONAL HX COLONIC POLYPS 04/03/2010  . DYSPEPSIA 03/04/2010  . GUAIAC POSITIVE STOOL 03/04/2010  . HEMATURIA UNSPECIFIED 06/28/2009  . DYSURIA 06/28/2009  . HAMMER TOE 12/28/2008  . POSTMENOPAUSAL STATUS 12/28/2008  . BACK PAIN, LUMBAR 07/11/2008  . BENIGN NEOPLASM OF SKIN SITE UNSPECIFIED 02/28/2008  . Hyperlipidemia 04/15/2007  . Essential hypertension 04/15/2007  . DIVERTICULOSIS, COLON 04/15/2007  . THUMB PAIN, RIGHT 04/15/2007  . OSTEOPOROSIS 04/15/2007    Sumner Boast., PT 07/10/2020, 12:39 PM  [place], [place], [address] Phone: [phone]   Fax:  [phone]  Name: Cindy Velasquez MRN: [id number] Date of Birth: [birth year]

## 2020-07-12 ENCOUNTER — Encounter: Payer: Self-pay | Admitting: Physical Therapy

## 2020-07-12 ENCOUNTER — Ambulatory Visit: Payer: Medicare HMO | Admitting: Physical Therapy

## 2020-07-12 ENCOUNTER — Other Ambulatory Visit: Payer: Self-pay

## 2020-07-12 DIAGNOSIS — M545 Low back pain: Secondary | ICD-10-CM | POA: Diagnosis not present

## 2020-07-12 DIAGNOSIS — M25562 Pain in left knee: Secondary | ICD-10-CM

## 2020-07-12 DIAGNOSIS — R262 Difficulty in walking, not elsewhere classified: Secondary | ICD-10-CM

## 2020-07-12 DIAGNOSIS — M25662 Stiffness of left knee, not elsewhere classified: Secondary | ICD-10-CM

## 2020-07-12 DIAGNOSIS — G8929 Other chronic pain: Secondary | ICD-10-CM | POA: Diagnosis not present

## 2020-07-12 NOTE — Therapy (Signed)
[place], [place], [address] Phone: [phone]   Fax:  [phone]  Physical Therapy Treatment  Patient Details  Name: Cindy Velasquez MRN: [id number] Date of Birth: 1941-06-24 Referring Provider (PT): Ninfa Linden   Encounter Date: 07/12/2020   PT End of Session - 07/12/20 1148    Visit Number 8    Date for PT Re-Evaluation 07/25/20    Authorization Type [employer]    PT Start Time 1103    PT Stop Time 1145    PT Time Calculation (min) 42 min    Activity Tolerance Patient tolerated treatment well    Behavior During Therapy [facility] for tasks assessed/performed           Past Medical History:  Diagnosis Date  . Adenomatous colon polyp   . Cancer ([place]) 03/2015   BCC R tibia   . Diverticulosis of colon   . GERD (gastroesophageal reflux disease)   . Hip pain   . Hyperlipidemia   . Hyperplastic colon polyp   . Hypertension   . Lactose intolerance   . Loose stools   . Lower back pain   . Osteoporosis   . Palpitations     Past Surgical History:  Procedure Laterality Date  . EYE SURGERY  2012   B/L 01/2011  02/2011  . HAMMER TOE SURGERY     Bilateral  . ROTATOR CUFF REPAIR     Left  . TUBAL LIGATION      There were no vitals filed for this visit.   Subjective Assessment - 07/12/20 1108    Subjective Pt reports feeling good today; wants to go over HEP to make sure she is doing ex's correctly    Currently in Pain? No/denies    Pain Score 0-No pain    Pain Location Back                             [facility] Adult PT Treatment/Exercise - 07/12/20 0001      Ambulation/Gait   Gait Comments stairs one at a time and then some step over step up and down      High Level Balance   High Level Balance Activities Side stepping;Backward walking;Marching forwards      Knee/Hip Exercises: Aerobic   Recumbent Bike L1 x 6 min    Nustep L5 x 5 min      Knee/Hip Exercises: Standing    Heel Raises Both;1 set;15 reps    Knee Flexion Both;1 set;10 reps    Knee Flexion Limitations 3    Hip Flexion Both;2 sets;10 reps    Hip Flexion Limitations once with cane; once with no AD    Hip Abduction Both;1 set;10 reps    Abduction Limitations 3    Hip Extension Both;1 set;10 reps    Extension Limitations 3      Knee/Hip Exercises: Seated   Long Arc Quad Both;3 sets;10 reps    Long Arc Quad Weight 3 lbs.    Marching Both;2 sets;10 reps    Marching Limitations 3                    PT Short Term Goals - 06/26/20 1151      PT SHORT TERM GOAL #1   Title independent with initial HEP    Time 2    Period Weeks    Status Achieved  PT Long Term Goals - 07/10/20 1238      PT LONG TERM GOAL #1   Title walk without device with minimal to no deviation    Status Partially Met      PT LONG TERM GOAL #2   Title increase left knee AROM to 115 degrees flexion    Status Partially Met                 Plan - 07/12/20 1149    Clinical Impression Statement Pt did well with progression of tx today. Cues for longer step length with backwards walking and for knee flexion with gait. Pt wanted to build confidence for stairs for trip this weekend; reinforced stair education from last rx. Pt required cuing for AD/LE sequencing and struggled with step over step, but was able to demo safe step to pattern up/down stairs.    PT Treatment/Interventions ADLs/Self Care Home Management;Cryotherapy;Electrical Stimulation;Gait training;Stair training;Functional mobility training;Therapeutic activities;Therapeutic exercise;Balance training;Neuromuscular re-education;Manual techniques;Vasopneumatic Device;Patient/family education    PT Next Visit Plan be very careful of back and neck, she reports that she will be doing a lot of sitting in the car over the next week and does not want her back hurting.    Consulted and Agree with Plan of Care Patient           Patient will  benefit from skilled therapeutic intervention in order to improve the following deficits and impairments:  Abnormal gait, Decreased range of motion, Difficulty walking, Cardiopulmonary status limiting activity, Decreased activity tolerance, Pain, Decreased balance, Impaired flexibility, Decreased strength, Decreased mobility  Visit Diagnosis: Acute pain of left knee  Stiffness of left knee, not elsewhere classified  Difficulty in walking, not elsewhere classified  Chronic bilateral low back pain without sciatica     Problem List Patient Active Problem List   Diagnosis Date Noted  . Prediabetes 12/13/2018  . Vitamin D deficiency 12/13/2018  . Overweight (BMI 25.0-29.9) 12/13/2018  . Preventative health care 09/15/2018  . Bilateral cold feet 06/17/2018  . Idiopathic peripheral neuropathy 06/17/2018  . Lower extremity edema 06/17/2018  . Right shoulder pain 08/20/2016  . Knee pain, bilateral 04/23/2015  . Urticaria 10/13/2014  . Neuropathy 10/13/2014  . Fatigue 10/13/2014  . Abrasion of face 08/01/2013  . Rib pain on left side 07/25/2013  . Tinea cruris 06/22/2012  . Eustachian tube dysfunction 04/07/2012  . Cerumen impaction 04/07/2012  . URI 10/30/2010  . CHANGE IN BOWELS 04/03/2010  . FECAL OCCULT BLOOD 04/03/2010  . PERSONAL HX COLONIC POLYPS 04/03/2010  . DYSPEPSIA 03/04/2010  . GUAIAC POSITIVE STOOL 03/04/2010  . HEMATURIA UNSPECIFIED 06/28/2009  . DYSURIA 06/28/2009  . HAMMER TOE 12/28/2008  . POSTMENOPAUSAL STATUS 12/28/2008  . BACK PAIN, LUMBAR 07/11/2008  . BENIGN NEOPLASM OF SKIN SITE UNSPECIFIED 02/28/2008  . Hyperlipidemia 04/15/2007  . Essential hypertension 04/15/2007  . DIVERTICULOSIS, COLON 04/15/2007  . THUMB PAIN, RIGHT 04/15/2007  . OSTEOPOROSIS 04/15/2007   Amador Cunas, PT, DPT Donald Prose Juanda Luba 07/12/2020, 11:54 AM  [place], [place], [address] Phone:  [phone]   Fax:  [phone]  Name: RHYLEI MCQUAIG MRN: [id number] Date of Birth: 05-17-1941

## 2020-07-24 ENCOUNTER — Encounter: Payer: Self-pay | Admitting: Physical Therapy

## 2020-07-24 ENCOUNTER — Other Ambulatory Visit: Payer: Self-pay

## 2020-07-24 ENCOUNTER — Ambulatory Visit: Payer: Medicare HMO | Admitting: Physical Therapy

## 2020-07-24 DIAGNOSIS — M25662 Stiffness of left knee, not elsewhere classified: Secondary | ICD-10-CM

## 2020-07-24 DIAGNOSIS — M545 Low back pain: Secondary | ICD-10-CM | POA: Diagnosis not present

## 2020-07-24 DIAGNOSIS — G8929 Other chronic pain: Secondary | ICD-10-CM | POA: Diagnosis not present

## 2020-07-24 DIAGNOSIS — R262 Difficulty in walking, not elsewhere classified: Secondary | ICD-10-CM

## 2020-07-24 DIAGNOSIS — M25562 Pain in left knee: Secondary | ICD-10-CM

## 2020-07-24 NOTE — Therapy (Signed)
[place], [place], [address] Phone: [phone]   Fax:  [phone]  Physical Therapy Treatment  Patient Details  Name: Cindy Velasquez MRN: [id number] Date of Birth: 1941/05/28 Referring Provider (PT): Ninfa Linden   Encounter Date: 07/24/2020   PT End of Session - 07/24/20 1148    Visit Number 9    Date for PT Re-Evaluation 07/25/20    Authorization Type [employer]    PT Start Time 1102    PT Stop Time 1146    PT Time Calculation (min) 44 min    Activity Tolerance Patient tolerated treatment well    Behavior During Therapy [facility] for tasks assessed/performed           Past Medical History:  Diagnosis Date  . Adenomatous colon polyp   . Cancer ([place]) 03/2015   BCC R tibia   . Diverticulosis of colon   . GERD (gastroesophageal reflux disease)   . Hip pain   . Hyperlipidemia   . Hyperplastic colon polyp   . Hypertension   . Lactose intolerance   . Loose stools   . Lower back pain   . Osteoporosis   . Palpitations     Past Surgical History:  Procedure Laterality Date  . EYE SURGERY  2012   B/L 01/2011  02/2011  . HAMMER TOE SURGERY     Bilateral  . ROTATOR CUFF REPAIR     Left  . TUBAL LIGATION      There were no vitals filed for this visit.   Subjective Assessment - 07/24/20 1107    Subjective Patient was out of town last week for a funeral, reports that the knee is doing well, used a cane and did the stretches that we have given, reports that she is having more back pain.  Do to the new [employer] I may try to do a renewal on Thursday and include the low back in the treatment, it seems that maybe the compensation with walking has started to exacerbate this, x-rays of the back from 2 years ago shows the DDD and stenosis    Currently in Pain? Yes    Pain Score 4     Pain Location Back    Pain Orientation Lower    Pain Descriptors / Indicators Dull    Pain Type Acute pain     Aggravating Factors  patietn reports that the weight machines and compensating with walking have increaesd the low back pain                             [facility] Adult PT Treatment/Exercise - 07/24/20 0001      Ambulation/Gait   Gait Comments practiced stairs step over step up and down with handrail and CGA      Knee/Hip Exercises: Aerobic   Recumbent Bike L1 x 6 min    Nustep L5 x 5 min      Knee/Hip Exercises: Standing   Heel Raises Both;1 set;15 reps    Knee Flexion Both;1 set;10 reps    Knee Flexion Limitations 3    Hip Flexion Both;2 sets;10 reps    Hip Abduction Both;2 sets;10 reps    Abduction Limitations 3    Hip Extension Both;2 sets;10 reps    Extension Limitations 3    Other Standing Knee Exercises resisted gait all directions      Knee/Hip Exercises: Seated   Long  [employer] Both;3 sets;10 reps    [employer] Weight 3 lbs.    Marching Both;2 sets;10 reps    Marching Limitations 3      Knee/Hip Exercises: Supine   Bridges with Ball Squeeze Both;2 sets;10 reps   small motions   Other Supine Knee/Hip Exercises left ankle red tband all motions 2x10                    PT Short Term Goals - 06/26/20 1151      PT SHORT TERM GOAL #1   Title independent with initial HEP    Time 2    Period Weeks    Status Achieved             PT Long Term Goals - 07/10/20 1238      PT LONG TERM GOAL #1   Title walk without device with minimal to no deviation    Status Partially Met      PT LONG TERM GOAL #2   Title increase left knee AROM to 115 degrees flexion    Status Partially Met                 Plan - 07/24/20 1148    Clinical Impression Statement Patient reports that her knee is doing well, she feels tha tshe does compensate some and feels that this is causing increase in neck and back pain.  Due to the [facility] I suggested that we see her this thurday for the knee, then she will need a script for neck and back pain and we  can renew but will add or evaluate for neck and back pain, she will be out of town and we will do this after Labor day    PT Next Visit Plan She is to see if she can see her MD about the back and the neck    Consulted and Agree with Plan of Care Patient           Patient will benefit from skilled therapeutic intervention in order to improve the following deficits and impairments:  Abnormal gait, Decreased range of motion, Difficulty walking, Cardiopulmonary status limiting activity, Decreased activity tolerance, Pain, Decreased balance, Impaired flexibility, Decreased strength, Decreased mobility  Visit Diagnosis: Acute pain of left knee  Stiffness of left knee, not elsewhere classified  Difficulty in walking, not elsewhere classified  Chronic bilateral low back pain without sciatica     Problem List Patient Active Problem List   Diagnosis Date Noted  . Prediabetes 12/13/2018  . Vitamin D deficiency 12/13/2018  . Overweight (BMI 25.0-29.9) 12/13/2018  . Preventative health care 09/15/2018  . Bilateral cold feet 06/17/2018  . Idiopathic peripheral neuropathy 06/17/2018  . Lower extremity edema 06/17/2018  . Right shoulder pain 08/20/2016  . Knee pain, bilateral 04/23/2015  . Urticaria 10/13/2014  . Neuropathy 10/13/2014  . Fatigue 10/13/2014  . Abrasion of face 08/01/2013  . Rib pain on left side 07/25/2013  . Tinea cruris 06/22/2012  . Eustachian tube dysfunction 04/07/2012  . Cerumen impaction 04/07/2012  . URI 10/30/2010  . CHANGE IN BOWELS 04/03/2010  . FECAL OCCULT BLOOD 04/03/2010  . PERSONAL HX COLONIC POLYPS 04/03/2010  . DYSPEPSIA 03/04/2010  . GUAIAC POSITIVE STOOL 03/04/2010  . HEMATURIA UNSPECIFIED 06/28/2009  . DYSURIA 06/28/2009  . HAMMER TOE 12/28/2008  . POSTMENOPAUSAL STATUS 12/28/2008  . BACK PAIN, LUMBAR 07/11/2008  . BENIGN NEOPLASM OF SKIN SITE UNSPECIFIED 02/28/2008  . Hyperlipidemia 04/15/2007  .  Essential hypertension 04/15/2007  .  DIVERTICULOSIS, COLON 04/15/2007  . THUMB PAIN, RIGHT 04/15/2007  . OSTEOPOROSIS 04/15/2007    Sumner Boast., PT 07/24/2020, 11:52 AM  [place], [place], [address] Phone: [phone]   Fax:  [phone]  Name: Cindy Velasquez MRN: [id number] Date of Birth: [birth year]

## 2020-07-26 ENCOUNTER — Ambulatory Visit: Payer: Medicare HMO | Admitting: Physical Therapy

## 2020-07-26 ENCOUNTER — Other Ambulatory Visit: Payer: Self-pay

## 2020-07-26 ENCOUNTER — Encounter: Payer: Self-pay | Admitting: Physical Therapy

## 2020-07-26 DIAGNOSIS — R262 Difficulty in walking, not elsewhere classified: Secondary | ICD-10-CM

## 2020-07-26 DIAGNOSIS — M545 Low back pain, unspecified: Secondary | ICD-10-CM

## 2020-07-26 DIAGNOSIS — M25562 Pain in left knee: Secondary | ICD-10-CM | POA: Diagnosis not present

## 2020-07-26 DIAGNOSIS — G8929 Other chronic pain: Secondary | ICD-10-CM | POA: Diagnosis not present

## 2020-07-26 DIAGNOSIS — M25662 Stiffness of left knee, not elsewhere classified: Secondary | ICD-10-CM

## 2020-07-26 NOTE — Therapy (Addendum)
[place] [address] [place], [place], [address] Phone: [phone]   Fax:  [phone] Progress Note Reporting Period 06/13/20 to 07/26/20 for the first 10 visits  See note below for Objective Data and Assessment of Progress/Goals.      Physical Therapy Treatment  Patient Details  Name: Cindy Velasquez MRN: [id number] Date of Birth: 09-12-1941 Referring Provider (PT): Hedy Camara Date: 07/26/2020   PT End of Session - 07/26/20 1652    Visit Number 10    PT Start Time 4132    PT Stop Time 1610    PT Time Calculation (min) 47 min    Activity Tolerance Patient tolerated treatment well    Behavior During Therapy [facility] for tasks assessed/performed           Past Medical History:  Diagnosis Date  . Adenomatous colon polyp   . Cancer ([place]) 03/2015   BCC R tibia   . Diverticulosis of colon   . GERD (gastroesophageal reflux disease)   . Hip pain   . Hyperlipidemia   . Hyperplastic colon polyp   . Hypertension   . Lactose intolerance   . Loose stools   . Lower back pain   . Osteoporosis   . Palpitations     Past Surgical History:  Procedure Laterality Date  . EYE SURGERY  2012   B/L 01/2011  02/2011  . HAMMER TOE SURGERY     Bilateral  . ROTATOR CUFF REPAIR     Left  . TUBAL LIGATION      There were no vitals filed for this visit.   Subjective Assessment - 07/26/20 1531    Subjective Patient is going to see her primary for the back referral to PT.  She reports that she is doing well with the knee, she thinks that the back is from some compensation from the knee issue,    Currently in Pain? Yes    Pain Score 4     Pain Location Back    Pain Orientation Lower    Aggravating Factors  activity                             [facility] Adult PT Treatment/Exercise - 07/26/20 0001      Ambulation/Gait   Gait Comments practiced stairs step over step up and down with handrail and CGA,  tandem walking, worked on faster walking, the issue with this was her ankle, she had ankle pain, c/o stifness      Knee/Hip Exercises: Stretches   Press photographer Both;2 reps;20 seconds    Soleus Stretch Both;3 reps;10 seconds      Knee/Hip Exercises: Aerobic   Recumbent Bike L1 x 6 min    Nustep L5 x 5 min      Knee/Hip Exercises: Standing   Knee Flexion Both;2 sets;10 reps    Knee Flexion Limitations 3    Hip Abduction Both;2 sets;10 reps    Abduction Limitations 3    Hip Extension Both;2 sets;10 reps    Extension Limitations 3      Knee/Hip Exercises: Seated   Long Arc Quad Both;3 sets;10 reps    Long Arc Quad Weight 3 lbs.    Marching Both;2 sets;10 reps    Marching Limitations 3    Hamstring Curl Left;2 sets;10 reps     Hamstring Weights 3 lbs.  PT Education - 07/26/20 1651    Education Details reviewed HEP for the knee, gave HEP for WMs flexion    Person(s) Educated Patient    Methods Explanation;Demonstration    Comprehension Verbalized understanding            PT Short Term Goals - 06/26/20 1151      PT SHORT TERM GOAL #1   Title independent with initial HEP    Time 2    Period Weeks    Status Achieved             PT Long Term Goals - 07/26/20 1657      PT LONG TERM GOAL #1   Title walk without device with minimal to no deviation    Status Partially Met      PT LONG TERM GOAL #2   Title increase left knee AROM to 115 degrees flexion    Status Partially Met      PT LONG TERM GOAL #3   Title decrease pain 50%    Status Achieved      PT LONG TERM GOAL #4   Title go up and down steps step over step    Status On-going                 Plan - 07/26/20 1654    Clinical Impression Statement Patient is still doing well with the knee, she has some ankle pain and this seems to limit her ability to walk, she is compensating more due to ankle than the knee, she still has neck and back pain, possibly from compensation  patterns.  Her insurance is one that I have to get pre authorization to do any intervention, so I asked her to see her MD so we could get an order for the neck and back and we could then ask for the preauthorization to treat this as this seems to be her biggest complaint now.    PT Next Visit Plan She is to see if she can see her MD about the back and the neck, she is on vacation the next two weeks, if okay with MD when she returns we will start and ask for inusracnce to allow treatment for the neck and the back    Consulted and Agree with Plan of Care Patient           Patient will benefit from skilled therapeutic intervention in order to improve the following deficits and impairments:  Abnormal gait, Decreased range of motion, Difficulty walking, Cardiopulmonary status limiting activity, Decreased activity tolerance, Pain, Decreased balance, Impaired flexibility, Decreased strength, Decreased mobility  Visit Diagnosis: Acute pain of left knee  Stiffness of left knee, not elsewhere classified  Difficulty in walking, not elsewhere classified  Chronic bilateral low back pain without sciatica     Problem List Patient Active Problem List   Diagnosis Date Noted  . Prediabetes 12/13/2018  . Vitamin D deficiency 12/13/2018  . Overweight (BMI 25.0-29.9) 12/13/2018  . Preventative health care 09/15/2018  . Bilateral cold feet 06/17/2018  . Idiopathic peripheral neuropathy 06/17/2018  . Lower extremity edema 06/17/2018  . Right shoulder pain 08/20/2016  . Knee pain, bilateral 04/23/2015  . Urticaria 10/13/2014  . Neuropathy 10/13/2014  . Fatigue 10/13/2014  . Abrasion of face 08/01/2013  . Rib pain on left side 07/25/2013  . Tinea cruris 06/22/2012  . Eustachian tube dysfunction 04/07/2012  . Cerumen impaction 04/07/2012  . URI 10/30/2010  . CHANGE IN BOWELS 04/03/2010  .  FECAL OCCULT BLOOD 04/03/2010  . PERSONAL HX COLONIC POLYPS 04/03/2010  . DYSPEPSIA 03/04/2010  . GUAIAC  POSITIVE STOOL 03/04/2010  . HEMATURIA UNSPECIFIED 06/28/2009  . DYSURIA 06/28/2009  . HAMMER TOE 12/28/2008  . POSTMENOPAUSAL STATUS 12/28/2008  . BACK PAIN, LUMBAR 07/11/2008  . BENIGN NEOPLASM OF SKIN SITE UNSPECIFIED 02/28/2008  . Hyperlipidemia 04/15/2007  . Essential hypertension 04/15/2007  . DIVERTICULOSIS, COLON 04/15/2007  . THUMB PAIN, RIGHT 04/15/2007  . OSTEOPOROSIS 04/15/2007    Sumner Boast., PT 07/26/2020, 6:05 PM  [place], [place], [address] Phone: [phone]   Fax:  [phone]  Name: ZYLEE MARCHIANO MRN: [id number] Date of Birth: 22-Apr-1941

## 2020-07-27 ENCOUNTER — Encounter: Payer: Self-pay | Admitting: Family Medicine

## 2020-07-27 ENCOUNTER — Ambulatory Visit (INDEPENDENT_AMBULATORY_CARE_PROVIDER_SITE_OTHER): Payer: Medicare HMO | Admitting: Family Medicine

## 2020-07-27 VITALS — BP 120/60 | HR 79 | Temp 97.9°F | Resp 18 | Ht 59.0 in | Wt 153.4 lb

## 2020-07-27 DIAGNOSIS — I10 Essential (primary) hypertension: Secondary | ICD-10-CM | POA: Diagnosis not present

## 2020-07-27 DIAGNOSIS — E785 Hyperlipidemia, unspecified: Secondary | ICD-10-CM | POA: Diagnosis not present

## 2020-07-27 DIAGNOSIS — M4804 Spinal stenosis, thoracic region: Secondary | ICD-10-CM

## 2020-07-27 LAB — LIPID PANEL
Cholesterol: 139 mg/dL (ref 0–200)
HDL: 41.4 mg/dL (ref >39.00)
LDL Cholesterol: 68 mg/dL (ref 0–99)
NonHDL: 97.19
Total CHOL/HDL Ratio: 3
Triglycerides: 147 mg/dL (ref 0.0–149.0)
VLDL: 29.4 mg/dL (ref 0.0–40.0)

## 2020-07-27 LAB — COMPREHENSIVE METABOLIC PANEL
ALT: 24 U/L (ref 0–35)
AST: 20 U/L (ref 0–37)
Albumin: 3.8 g/dL (ref 3.5–5.2)
Alkaline Phosphatase: 91 U/L (ref 39–117)
BUN: 19 mg/dL (ref 6–23)
CO2: 30 mEq/L (ref 19–32)
Calcium: 9.4 mg/dL (ref 8.4–10.5)
Chloride: 104 mEq/L (ref 96–112)
Creatinine, Ser: 0.71 mg/dL (ref 0.40–1.20)
GFR: 79.41 mL/min (ref >60.00)
Glucose, Bld: 131 mg/dL — ABNORMAL HIGH (ref 70–99)
Potassium: 3.9 mEq/L (ref 3.5–5.1)
Sodium: 141 mEq/L (ref 135–145)
Total Bilirubin: 0.5 mg/dL (ref 0.2–1.2)
Total Protein: 6.7 g/dL (ref 6.0–8.3)

## 2020-07-27 NOTE — Assessment & Plan Note (Signed)
Well controlled, no changes to meds. Encouraged heart healthy diet such as the DASH diet and exercise as tolerated.  °

## 2020-07-27 NOTE — Progress Notes (Signed)
Patient ID: Cindy Velasquez, female    DOB: [birth year]  Age: 79 y.o. MRN: [id number]    Subjective:  Subjective  HPI Cindy Velasquez presents for referral for upper back pain.  She just finished bp for her knee and leg fracture and pt said he could help with her upper back pain as well   MRI T spine done 2019 did show disc protrusion T5-6    Review of Systems  Constitutional: Negative for appetite change, diaphoresis, fatigue and unexpected weight change.  Eyes: Negative for pain, redness and visual disturbance.  Respiratory: Negative for cough, chest tightness, shortness of breath and wheezing.   Cardiovascular: Negative for chest pain, palpitations and leg swelling.  Endocrine: Negative for cold intolerance, heat intolerance, polydipsia, polyphagia and polyuria.  Genitourinary: Negative for difficulty urinating, dysuria and frequency.  Musculoskeletal: Positive for back pain. Negative for neck pain and neck stiffness.  Neurological: Negative for dizziness, light-headedness, numbness and headaches.    History Past Medical History:  Diagnosis Date  . Adenomatous colon polyp   . Cancer ([place]) 03/2015   BCC R tibia   . Diverticulosis of colon   . GERD (gastroesophageal reflux disease)   . Hip pain   . Hyperlipidemia   . Hyperplastic colon polyp   . Hypertension   . Lactose intolerance   . Loose stools   . Lower back pain   . Osteoporosis   . Palpitations     She has a past surgical history that includes Tubal ligation; Hammer toe surgery; Rotator cuff repair; and Eye surgery (2012).   Her family history includes Cancer (age of onset: 12) in her mother; Heart disease in her brother and father; Hyperlipidemia in her mother; Hypertension in her brother, brother, father, and mother; Obesity in her mother; Stroke in her father; Testicular cancer (age of onset: 85) in her brother.She reports that she quit smoking about 29 years ago. Her smoking use included cigarettes. She has a 15.00  pack-year smoking history. She has never used smokeless tobacco. She reports current alcohol use of about 5.0 standard drinks of alcohol per week. She reports that she does not use drugs.  Current Outpatient Medications on File Prior to Visit  Medication Sig Dispense Refill  . Cholecalciferol (VITAMIN D3) 125 MCG (5000 UT) CAPS Take 1 capsule (5,000 Units total) by mouth daily. 30 capsule 0  . diphenhydramine-acetaminophen (TYLENOL PM) 25-500 MG TABS tablet Take 1 tablet by mouth at bedtime.    . hydrochlorothiazide (HYDRODIURIL) 25 MG tablet Take 0.5 tablets (12.5 mg total) by mouth every morning. 45 tablet 1  . lisinopril (ZESTRIL) 20 MG tablet Take 1 tablet (20 mg total) by mouth daily. Pt needs OV for further refills 30 tablet 3  . Multiple Vitamins-Minerals (ICAPS AREDS 2) CAPS Take 2 capsules by mouth daily.     . rosuvastatin (CRESTOR) 20 MG tablet TAKE 1 TABLET BY MOUTH EVERY DAY 90 tablet 1  . colestipol (COLESTID) 1 g tablet TAKE 1 TABLET(1 GRAM) BY MOUTH TWICE DAILY (Patient not taking: Reported on 07/27/2020) 60 tablet 3  . diphenoxylate-atropine (LOMOTIL) 2.5-0.025 MG tablet Take 1 tablet by mouth 3 (three) times daily. (Patient not taking: Reported on 07/27/2020) 60 tablet 2  . potassium chloride SA (KLOR-CON) 20 MEQ tablet Take 1 tablet (20 mEq total) by mouth daily. (Patient not taking: Reported on 07/27/2020) 30 tablet 3   No current facility-administered medications on file prior to visit.     Objective:  Objective  Physical Exam  Vitals and nursing note reviewed.  Constitutional:      Appearance: She is well-developed.  HENT:     Head: Normocephalic and atraumatic.  Eyes:     Conjunctiva/sclera: Conjunctivae normal.  Neck:     Thyroid: No thyromegaly.     Vascular: No carotid bruit or JVD.  Cardiovascular:     Rate and Rhythm: Normal rate and regular rhythm.     Heart sounds: Normal heart sounds. No murmur heard.   Pulmonary:     Effort: Pulmonary effort is normal.  No respiratory distress.     Breath sounds: Normal breath sounds. No wheezing or rales.  Chest:     Chest wall: No tenderness.  Musculoskeletal:        General: Normal range of motion.     Cervical back: Normal range of motion and neck supple.     Comments: Mid back pain  No numbness tingling / weakness  Neurological:     Mental Status: She is alert and oriented to person, place, and time.    BP 120/60 (BP Location: Left Arm, Patient Position: Sitting, Cuff Size: Normal)   Pulse 79   Temp 97.9 F (36.6 C) (Oral)   Resp 18   Ht 4\' 11"  (1.499 m)   Wt 153 lb 6.4 oz (69.6 kg)   SpO2 97%   BMI 30.98 kg/m  Wt Readings from Last 3 Encounters:  07/27/20 153 lb 6.4 oz (69.6 kg)  02/16/20 150 lb (68 kg)  10/07/19 147 lb 2 oz (66.7 kg)     Lab Results  Component Value Date   WBC 7.3 09/15/2019   HGB 13.2 09/15/2019   HCT 40.0 09/15/2019   PLT 245.0 09/15/2019   GLUCOSE 77 09/15/2019   CHOL 141 09/15/2019   TRIG 198.0 (H) 09/15/2019   HDL 43.40 09/15/2019   LDLDIRECT 129.1 08/23/2007   LDLCALC 58 09/15/2019   ALT 20 09/15/2019   AST 18 09/15/2019   NA 141 09/15/2019   K 3.3 (L) 09/15/2019   CL 101 09/15/2019   CREATININE 0.64 09/15/2019   BUN 17 09/15/2019   CO2 32 09/15/2019   TSH 1.29 09/15/2019   HGBA1C 5.7 (H) 03/30/2019    DG Tibia/Fibula Left  Result Date: 02/16/2020 CLINICAL DATA:  Left lower leg pain after fall today. Bruising. EXAM: LEFT TIBIA AND FIBULA - 2 VIEW COMPARISON:  None. FINDINGS: Lucency through the mid tibial spines. Cortical margins of the tibia otherwise intact. No evidence of fibular fracture. Cannot assess for joint effusion, knee joint not included in the field of view. Soft tissues are unremarkable. IMPRESSION: Lucency through the mid tibial spines may be a nondisplaced fracture. Recommend dedicated knee radiographs. No other fracture of the left lower leg. Electronically Signed   By: Keith Rake M.D.   On: 02/16/2020 19:29   DG Knee  Complete 4 Views Left  Result Date: 02/16/2020 CLINICAL DATA:  Fall today with left knee pain. Lucency of the tibial spines on tib fib radiograph. EXAM: LEFT KNEE - COMPLETE 4+ VIEW COMPARISON:  Tibia-fibula radiograph earlier today. FINDINGS: Suspected avulsion fracture involving the medial tibial spine which is minimally displaced. No other fracture. Mild peripheral spurring. There is a small knee joint effusion. Quadriceps and patellar tendon enthesophytes. IMPRESSION: Suspected minimally displaced avulsion fracture involving the medial tibial spine. Small joint effusion. Electronically Signed   By: Keith Rake M.D.   On: 02/16/2020 20:17   DG HIP UNILAT WITH PELVIS 2-3 VIEWS LEFT  Result Date: 02/16/2020  CLINICAL DATA:  Left hip pain after fall today EXAM: DG HIP (WITH OR WITHOUT PELVIS) 2-3V LEFT COMPARISON:  None. FINDINGS: The cortical margins of the bony pelvis and left hip are intact. No fracture. Pubic symphysis and sacroiliac joints are congruent. Both femoral heads are well-seated in the respective acetabula. IMPRESSION: Negative radiographs of the pelvis and left hip. Electronically Signed   By: Keith Rake M.D.   On: 02/16/2020 19:31     Assessment & Plan:  Plan  I am having Cindy Velasquez maintain her ICaps Areds 2, diphenhydramine-acetaminophen, Vitamin D3, colestipol, potassium chloride SA, diphenoxylate-atropine, hydrochlorothiazide, lisinopril, and rosuvastatin.  No orders of the defined types were placed in this encounter.   Problem List Items Addressed This Visit      Unprioritized   Dyslipidemia    Tolerating statin, encouraged heart healthy diet, avoid trans fats, minimize simple carbs and saturated fats. Increase exercise as tolerated      Relevant Orders   Lipid panel   Comprehensive metabolic panel   Essential hypertension    Well controlled, no changes to meds. Encouraged heart healthy diet such as the DASH diet and exercise as tolerated.         Relevant Orders   Lipid panel   Comprehensive metabolic panel   Thoracic stenosis - Primary    Mri 2019 reviewed  Referral to PT  rto prn  Pt has cpe in oct       Relevant Orders   Ambulatory referral to Physical Therapy      Follow-up: Return if symptoms worsen or fail to improve.  Ann Held, DO

## 2020-07-27 NOTE — Patient Instructions (Signed)

## 2020-07-27 NOTE — Assessment & Plan Note (Signed)
Mri 2019 reviewed  Referral to PT  rto prn  Pt has cpe in oct

## 2020-07-27 NOTE — Assessment & Plan Note (Signed)
Tolerating statin, encouraged heart healthy diet, avoid trans fats, minimize simple carbs and saturated fats. Increase exercise as tolerated 

## 2020-07-29 ENCOUNTER — Encounter (HOSPITAL_BASED_OUTPATIENT_CLINIC_OR_DEPARTMENT_OTHER): Payer: Self-pay | Admitting: Emergency Medicine

## 2020-07-29 ENCOUNTER — Other Ambulatory Visit: Payer: Self-pay | Admitting: Family Medicine

## 2020-07-29 ENCOUNTER — Emergency Department (HOSPITAL_BASED_OUTPATIENT_CLINIC_OR_DEPARTMENT_OTHER): Payer: Medicare HMO

## 2020-07-29 ENCOUNTER — Emergency Department (HOSPITAL_BASED_OUTPATIENT_CLINIC_OR_DEPARTMENT_OTHER)
Admission: EM | Admit: 2020-07-29 | Discharge: 2020-07-29 | Disposition: A | Discharge status: Home / Self Care | Payer: Medicare HMO | Attending: Emergency Medicine | Admitting: Emergency Medicine

## 2020-07-29 ENCOUNTER — Other Ambulatory Visit: Payer: Self-pay

## 2020-07-29 DIAGNOSIS — H6123 Impacted cerumen, bilateral: Secondary | ICD-10-CM | POA: Diagnosis not present

## 2020-07-29 DIAGNOSIS — E785 Hyperlipidemia, unspecified: Secondary | ICD-10-CM | POA: Diagnosis not present

## 2020-07-29 DIAGNOSIS — R739 Hyperglycemia, unspecified: Secondary | ICD-10-CM

## 2020-07-29 DIAGNOSIS — Z8249 Family history of ischemic heart disease and other diseases of the circulatory system: Secondary | ICD-10-CM | POA: Diagnosis not present

## 2020-07-29 DIAGNOSIS — I10 Essential (primary) hypertension: Secondary | ICD-10-CM | POA: Insufficient documentation

## 2020-07-29 DIAGNOSIS — Z79899 Other long term (current) drug therapy: Secondary | ICD-10-CM | POA: Diagnosis not present

## 2020-07-29 DIAGNOSIS — Z87891 Personal history of nicotine dependence: Secondary | ICD-10-CM | POA: Diagnosis not present

## 2020-07-29 DIAGNOSIS — I6521 Occlusion and stenosis of right carotid artery: Secondary | ICD-10-CM | POA: Diagnosis not present

## 2020-07-29 DIAGNOSIS — R42 Dizziness and giddiness: Secondary | ICD-10-CM

## 2020-07-29 DIAGNOSIS — M542 Cervicalgia: Secondary | ICD-10-CM | POA: Insufficient documentation

## 2020-07-29 DIAGNOSIS — I700 Atherosclerosis of aorta: Secondary | ICD-10-CM | POA: Diagnosis not present

## 2020-07-29 DIAGNOSIS — R7303 Prediabetes: Secondary | ICD-10-CM | POA: Insufficient documentation

## 2020-07-29 DIAGNOSIS — E669 Obesity, unspecified: Secondary | ICD-10-CM | POA: Diagnosis not present

## 2020-07-29 DIAGNOSIS — I6782 Cerebral ischemia: Secondary | ICD-10-CM | POA: Insufficient documentation

## 2020-07-29 LAB — COMPREHENSIVE METABOLIC PANEL
ALT: 26 U/L (ref 0–44)
AST: 26 U/L (ref 15–41)
Albumin: 3.8 g/dL (ref 3.5–5.0)
Alkaline Phosphatase: 88 U/L (ref 38–126)
Anion gap: 9 (ref 5–15)
BUN: 16 mg/dL (ref 8–23)
CO2: 27 mmol/L (ref 22–32)
Calcium: 9.3 mg/dL (ref 8.9–10.3)
Chloride: 103 mmol/L (ref 98–111)
Creatinine, Ser: 0.66 mg/dL (ref 0.44–1.00)
GFR calc Af Amer: >60 mL/min (ref >60)
GFR calc non Af Amer: >60 mL/min (ref >60)
Glucose, Bld: 164 mg/dL — ABNORMAL HIGH (ref 70–99)
Potassium: 3.6 mmol/L (ref 3.5–5.1)
Sodium: 139 mmol/L (ref 135–145)
Total Bilirubin: 0.5 mg/dL (ref 0.3–1.2)
Total Protein: 7.8 g/dL (ref 6.5–8.1)

## 2020-07-29 LAB — DIFFERENTIAL
Abs Immature Granulocytes: 0.01 10*3/uL (ref 0.00–0.07)
Basophils Absolute: 0.1 10*3/uL (ref 0.0–0.1)
Basophils Relative: 1 %
Eosinophils Absolute: 0.3 10*3/uL (ref 0.0–0.5)
Eosinophils Relative: 6 %
Immature Granulocytes: 0 %
Lymphocytes Relative: 28 %
Lymphs Abs: 1.6 10*3/uL (ref 0.7–4.0)
Monocytes Absolute: 0.5 10*3/uL (ref 0.1–1.0)
Monocytes Relative: 8 %
Neutro Abs: 3.3 10*3/uL (ref 1.7–7.7)
Neutrophils Relative %: 57 %

## 2020-07-29 LAB — CBC
HCT: 45.2 % (ref 36.0–46.0)
Hemoglobin: 14.4 g/dL (ref 12.0–15.0)
MCH: 29 pg (ref 26.0–34.0)
MCHC: 31.9 g/dL (ref 30.0–36.0)
MCV: 91.1 fL (ref 80.0–100.0)
Platelets: 273 10*3/uL (ref 150–400)
RBC: 4.96 MIL/uL (ref 3.87–5.11)
RDW: 14.6 % (ref 11.5–15.5)
WBC: 5.7 10*3/uL (ref 4.0–10.5)
nRBC: 0 % (ref 0.0–0.2)

## 2020-07-29 LAB — PROTIME-INR
INR: 1 (ref 0.8–1.2)
Prothrombin Time: 12.9 seconds (ref 11.4–15.2)

## 2020-07-29 LAB — CBG MONITORING, ED: Glucose-Capillary: 146 mg/dL — ABNORMAL HIGH (ref 70–99)

## 2020-07-29 LAB — APTT: aPTT: 31 seconds (ref 24–36)

## 2020-07-29 MED ORDER — SODIUM CHLORIDE 0.9% FLUSH
3.0000 mL | Freq: Once | INTRAVENOUS | Status: DC
  Filled 2020-07-29: qty 3

## 2020-07-29 MED ORDER — MECLIZINE HCL 25 MG PO TABS
25.0000 mg | ORAL_TABLET | Freq: Once | ORAL | Status: AC
  Administered 2020-07-29: 25 mg via ORAL
  Filled 2020-07-29: qty 1

## 2020-07-29 MED ORDER — IOHEXOL 350 MG/ML SOLN
100.0000 mL | Freq: Once | INTRAVENOUS | Status: AC | PRN
  Administered 2020-07-29: 100 mL via INTRAVENOUS

## 2020-07-29 MED ORDER — MECLIZINE HCL 25 MG PO TABS: 25.0000 mg | ORAL_TABLET | Freq: Three times a day (TID) | ORAL | 0 refills | Status: DC | PRN

## 2020-07-29 NOTE — ED Provider Notes (Signed)
[facility] EMERGENCY DEPARTMENT Provider Note   CSN: [id number] Arrival date & time: 07/29/20  1127     History Chief Complaint  Patient presents with  . Dizziness    Cindy Velasquez is a 79 y.o. female.  The history is provided by the patient.  Dizziness Quality:  Vertigo Severity:  Moderate Onset quality:  Sudden Duration:  1 day Timing:  Constant Progression:  Waxing and waning Chronicity:  New Context comment:  Seems to be constant all the time but does seem worse if she bends over and sometimes worse with walking Relieved by:  Nothing Ineffective treatments:  None tried Associated symptoms: no chest pain, no diarrhea, no headaches, no nausea, no palpitations, no shortness of breath, no syncope, no tinnitus, no vision changes, no vomiting and no weakness   Associated symptoms comment:  Neck pain that has been present now for months Risk factors comment:  Prediabetes, HTN, dyslipidemia      Past Medical History:  Diagnosis Date  . Adenomatous colon polyp   . Cancer ([place]) 03/2015   BCC R tibia   . Diverticulosis of colon   . GERD (gastroesophageal reflux disease)   . Hip pain   . Hyperlipidemia   . Hyperplastic colon polyp   . Hypertension   . Lactose intolerance   . Loose stools   . Lower back pain   . Osteoporosis   . Palpitations     Patient Active Problem List   Diagnosis Date Noted  . Thoracic stenosis 07/27/2020  . Dyslipidemia 07/27/2020  . Prediabetes 12/13/2018  . Vitamin D deficiency 12/13/2018  . Overweight (BMI 25.0-29.9) 12/13/2018  . Preventative health care 09/15/2018  . Bilateral cold feet 06/17/2018  . Idiopathic peripheral neuropathy 06/17/2018  . Lower extremity edema 06/17/2018  . Right shoulder pain 08/20/2016  . Knee pain, bilateral 04/23/2015  . Urticaria 10/13/2014  . Neuropathy 10/13/2014  . Fatigue 10/13/2014  . Abrasion of face 08/01/2013  . Rib pain on left side 07/25/2013  . Tinea cruris 06/22/2012  .  Eustachian tube dysfunction 04/07/2012  . Cerumen impaction 04/07/2012  . URI 10/30/2010  . CHANGE IN BOWELS 04/03/2010  . FECAL OCCULT BLOOD 04/03/2010  . PERSONAL HX COLONIC POLYPS 04/03/2010  . DYSPEPSIA 03/04/2010  . GUAIAC POSITIVE STOOL 03/04/2010  . HEMATURIA UNSPECIFIED 06/28/2009  . DYSURIA 06/28/2009  . HAMMER TOE 12/28/2008  . POSTMENOPAUSAL STATUS 12/28/2008  . BACK PAIN, LUMBAR 07/11/2008  . BENIGN NEOPLASM OF SKIN SITE UNSPECIFIED 02/28/2008  . Hyperlipidemia 04/15/2007  . Essential hypertension 04/15/2007  . DIVERTICULOSIS, COLON 04/15/2007  . THUMB PAIN, RIGHT 04/15/2007  . OSTEOPOROSIS 04/15/2007    Past Surgical History:  Procedure Laterality Date  . EYE SURGERY  2012   B/L 01/2011  02/2011  . HAMMER TOE SURGERY     Bilateral  . ROTATOR CUFF REPAIR     Left  . TUBAL LIGATION       OB History    Gravida  4   Para      Term      Preterm      AB      Living  4     SAB      TAB      Ectopic      Multiple      Live Births              Family History  Problem Relation Age of Onset  . Hypertension Mother   . Cancer  Mother 32       ?  Marland Kitchen Hyperlipidemia Mother   . Obesity Mother   . Hypertension Father   . Stroke Father   . Heart disease Father   . Hypertension Brother   . Testicular cancer Brother 75  . Hypertension Brother   . Heart disease Brother        quadruple bypass  . Colon cancer Neg Hx   . Stomach cancer Neg Hx   . Throat cancer Neg Hx     Social History   Tobacco Use  . Smoking status: Former Smoker    Packs/day: 0.50    Years: 30.00    Pack years: 15.00    Types: Cigarettes    Quit date: 06/02/1991    Years since quitting: 29.1  . Smokeless tobacco: Never Used  Vaping Use  . Vaping Use: Never used  Substance Use Topics  . Alcohol use: Yes    Alcohol/week: 5.0 standard drinks    Types: 5 Glasses of wine per week  . Drug use: No    Home Medications Prior to Admission medications   Medication Sig  Start Date End Date Taking? Authorizing Provider  Cholecalciferol (VITAMIN D3) 125 MCG (5000 UT) CAPS Take 1 capsule (5,000 Units total) by mouth daily. 04/05/19   Abby Potash, PA-C  colestipol (COLESTID) 1 g tablet TAKE 1 TABLET(1 GRAM) BY MOUTH TWICE DAILY Patient not taking: Reported on 07/27/2020 09/13/19   Irene Shipper, MD  diphenhydramine-acetaminophen (TYLENOL PM) 25-500 MG TABS tablet Take 1 tablet by mouth at bedtime.    [provider]  diphenoxylate-atropine (LOMOTIL) 2.5-0.025 MG tablet Take 1 tablet by mouth 3 (three) times daily. Patient not taking: Reported on 07/27/2020 10/07/19   Irene Shipper, MD  hydrochlorothiazide (HYDRODIURIL) 25 MG tablet Take 0.5 tablets (12.5 mg total) by mouth every morning. 02/07/20   Roma Schanz R, DO  lisinopril (ZESTRIL) 20 MG tablet Take 1 tablet (20 mg total) by mouth daily. Pt needs OV for further refills 06/28/20   Carollee Herter, Kendrick Fries R, DO  Multiple Vitamins-Minerals (ICAPS AREDS 2) CAPS Take 2 capsules by mouth daily.     [provider]  potassium chloride SA (KLOR-CON) 20 MEQ tablet Take 1 tablet (20 mEq total) by mouth daily. Patient not taking: Reported on 07/27/2020 09/21/19   Carollee Herter, Kendrick Fries R, DO  rosuvastatin (CRESTOR) 20 MG tablet TAKE 1 TABLET BY MOUTH EVERY DAY 07/04/20   Ann Held, DO    Allergies    Patient has no known allergies.  Review of Systems   Review of Systems  HENT: Negative for tinnitus.   Respiratory: Negative for shortness of breath.   Cardiovascular: Negative for chest pain, palpitations and syncope.  Gastrointestinal: Negative for diarrhea, nausea and vomiting.  Neurological: Positive for dizziness. Negative for weakness and headaches.  All other systems reviewed and are negative.   Physical Exam Updated Vital Signs BP (!) 163/58 (BP Location: Right Arm)   Pulse 73   Temp 98.3 F (36.8 C) (Oral)   Resp 18   Ht 4\' 11"  (1.499 m)   Wt 69.6 kg   SpO2 98%   BMI 30.98  kg/m   Physical Exam Vitals and nursing note reviewed.  Constitutional:      General: She is not in acute distress.    Appearance: Normal appearance. She is well-developed. She is obese.  HENT:     Head: Normocephalic and atraumatic.     Ears:  Comments: Cerumen impaction bilaterally Eyes:     Conjunctiva/sclera: Conjunctivae normal.     Pupils: Pupils are equal, round, and reactive to light.  Neck:     Comments: Mild midline neck tenderness Cardiovascular:     Rate and Rhythm: Normal rate and regular rhythm.     Heart sounds: No murmur heard.   Pulmonary:     Effort: Pulmonary effort is normal. No respiratory distress.     Breath sounds: Normal breath sounds. No wheezing or rales.  Abdominal:     General: There is no distension.     Palpations: Abdomen is soft.     Tenderness: There is no abdominal tenderness. There is no guarding or rebound.  Musculoskeletal:        General: Normal range of motion.     Cervical back: Normal range of motion and neck supple. Tenderness present.     Right lower leg: No edema.     Left lower leg: No edema.  Skin:    General: Skin is warm and dry.     Capillary Refill: Capillary refill takes less than 2 seconds.     Findings: No erythema or rash.  Neurological:     Mental Status: She is alert and oriented to person, place, and time.     Cranial Nerves: Facial asymmetry present.     Sensory: Sensation is intact.     Motor: Motor function is intact. No weakness or pronator drift.     Coordination: Coordination is intact. Finger-Nose-Finger Test and Heel to [place]a Test normal.     Gait: Gait is intact.     Comments: Mild droop of the left eyelid (chronic per pt)  Psychiatric:        Mood and Affect: Mood normal.        Behavior: Behavior normal.        Thought Content: Thought content normal.     ED Results / Procedures / Treatments   Labs (all labs ordered are listed, but only abnormal results are displayed) Labs Reviewed    COMPREHENSIVE METABOLIC PANEL - Abnormal; Notable for the following components:      Result Value   Glucose, Bld 164 (*)    All other components within normal limits  CBG MONITORING, ED - Abnormal; Notable for the following components:   Glucose-Capillary 146 (*)    All other components within normal limits  PROTIME-INR  APTT  CBC  DIFFERENTIAL    EKG EKG Interpretation  Date/Time:  Sunday July 29 2020 16:04:29 EDT Ventricular Rate:  76 PR Interval:    QRS Duration: 91 QT Interval:  416 QTC Calculation: 468 R Axis:   70 Text Interpretation: Sinus rhythm Normal ECG Confirmed by Blanchie Dessert [phone]) on 07/29/2020 4:23:14 PM   Radiology CT Angio Head W or Wo Contrast  Result Date: 07/29/2020 CLINICAL DATA:  Lightheaded and dizzy since yesterday. EXAM: CT ANGIOGRAPHY HEAD AND NECK TECHNIQUE: Multidetector CT imaging of the head and neck was performed using the standard protocol during bolus administration of intravenous contrast. Multiplanar CT image reconstructions and MIPs were obtained to evaluate the vascular anatomy. Carotid stenosis measurements (when applicable) are obtained utilizing NASCET criteria, using the distal internal carotid diameter as the denominator. CONTRAST:  140mL OMNIPAQUE IOHEXOL 350 MG/ML SOLN COMPARISON:  None. FINDINGS: CT HEAD FINDINGS Brain: Mild age related volume loss. Mild chronic appearing small vessel ischemic change of the cerebral hemispheric white matter. No sign of acute infarction, mass lesion, hemorrhage, hydrocephalus or extra-axial collection. Vascular:  Minimal atherosclerotic calcification of the vessels at the base of the brain. Skull: Negative Sinuses: Clear Orbits: Normal Review of the MIP images confirms the above findings CTA NECK FINDINGS Aortic arch: Aortic atherosclerotic calcification. Branching pattern is normal without origin stenosis. Right carotid system: Common carotid artery widely patent to the bifurcation. Calcified plaque  at the carotid bifurcation but no stenosis. Cervical ICA widely patent. Left carotid system: Common carotid artery widely patent to the bifurcation. Carotid bifurcation is normal without soft or calcified plaque. No stenosis. Cervical ICA widely patent. Vertebral arteries: No flow limiting subclavian disease. Both vertebral artery origins are widely patent. The right vertebral artery is dominant. Both vertebral arteries are patent through the cervical region to the foramen magnum. Skeleton: Ordinary cervical spondylosis. Some degree of spinal stenosis at C5-6. Chronic fusion at C4-5. Other neck: No mass or lymphadenopathy. Upper chest: Normal Review of the MIP images confirms the above findings CTA HEAD FINDINGS Anterior circulation: Both internal carotid arteries are patent through the skull base and siphon regions. No siphon stenosis. The anterior and middle cerebral vessels are patent without proximal stenosis, aneurysm or vascular malformation. No large or medium vessel occlusion. Fetal origin of the right PCA. Posterior circulation: Both vertebral arteries are widely patent through the foramen magnum to the basilar. No basilar stenosis. Posterior circulation branch vessels are normal. Venous sinuses: Patent and normal. Anatomic variants: None significant. Review of the MIP images confirms the above findings IMPRESSION: No acute head CT finding. Chronic small-vessel ischemic changes of the cerebral hemispheric white matter. No acute large or medium vessel occlusion. Aortic atherosclerosis. Minimal carotid bifurcation atherosclerosis on the right. No stenosis on either side. No vertebral artery or other posterior circulation pathology. Electronically Signed   By: Nelson Chimes M.D.   On: 07/29/2020 16:35   CT Angio Neck W and/or Wo Contrast  Result Date: 07/29/2020 CLINICAL DATA:  Lightheaded and dizzy since yesterday. EXAM: CT ANGIOGRAPHY HEAD AND NECK TECHNIQUE: Multidetector CT imaging of the head and neck  was performed using the standard protocol during bolus administration of intravenous contrast. Multiplanar CT image reconstructions and MIPs were obtained to evaluate the vascular anatomy. Carotid stenosis measurements (when applicable) are obtained utilizing NASCET criteria, using the distal internal carotid diameter as the denominator. CONTRAST:  160mL OMNIPAQUE IOHEXOL 350 MG/ML SOLN COMPARISON:  None. FINDINGS: CT HEAD FINDINGS Brain: Mild age related volume loss. Mild chronic appearing small vessel ischemic change of the cerebral hemispheric white matter. No sign of acute infarction, mass lesion, hemorrhage, hydrocephalus or extra-axial collection. Vascular: Minimal atherosclerotic calcification of the vessels at the base of the brain. Skull: Negative Sinuses: Clear Orbits: Normal Review of the MIP images confirms the above findings CTA NECK FINDINGS Aortic arch: Aortic atherosclerotic calcification. Branching pattern is normal without origin stenosis. Right carotid system: Common carotid artery widely patent to the bifurcation. Calcified plaque at the carotid bifurcation but no stenosis. Cervical ICA widely patent. Left carotid system: Common carotid artery widely patent to the bifurcation. Carotid bifurcation is normal without soft or calcified plaque. No stenosis. Cervical ICA widely patent. Vertebral arteries: No flow limiting subclavian disease. Both vertebral artery origins are widely patent. The right vertebral artery is dominant. Both vertebral arteries are patent through the cervical region to the foramen magnum. Skeleton: Ordinary cervical spondylosis. Some degree of spinal stenosis at C5-6. Chronic fusion at C4-5. Other neck: No mass or lymphadenopathy. Upper chest: Normal Review of the MIP images confirms the above findings CTA HEAD FINDINGS Anterior circulation: Both  internal carotid arteries are patent through the skull base and siphon regions. No siphon stenosis. The anterior and middle cerebral  vessels are patent without proximal stenosis, aneurysm or vascular malformation. No large or medium vessel occlusion. Fetal origin of the right PCA. Posterior circulation: Both vertebral arteries are widely patent through the foramen magnum to the basilar. No basilar stenosis. Posterior circulation branch vessels are normal. Venous sinuses: Patent and normal. Anatomic variants: None significant. Review of the MIP images confirms the above findings IMPRESSION: No acute head CT finding. Chronic small-vessel ischemic changes of the cerebral hemispheric white matter. No acute large or medium vessel occlusion. Aortic atherosclerosis. Minimal carotid bifurcation atherosclerosis on the right. No stenosis on either side. No vertebral artery or other posterior circulation pathology. Electronically Signed   By: Nelson Chimes M.D.   On: 07/29/2020 16:35    Procedures Procedures (including critical care time)  Medications Ordered in ED Medications  sodium chloride flush (NS) 0.9 % injection 3 mL (3 mLs Intravenous Not Given 07/29/20 1610)  meclizine (ANTIVERT) tablet 25 mg (has no administration in time range)  iohexol (OMNIPAQUE) 350 MG/ML injection 100 mL (100 mLs Intravenous Contrast Given 07/29/20 1609)    ED Course  I have reviewed the triage vital signs and the nursing notes.  Pertinent labs & imaging results that were available during my care of the patient were reviewed by me and considered in my medical decision making (see chart for details).    MDM Rules/Calculators/A&P                          Pt with sx that are peripheral vs central vertigo.  No systemic or infectious sx.  Normal neuro exam without weakness, ataxia or cerebellar findings on exam.  Normal vision.  Sx are not worse with head and she is having neck pain but seems more chronic.  No hx of Stroke but multiple risk factors.  Mild hypertension here but labs including CBC, CMP, coags and EKG without acute findings.   Will treat for  peripheral vertigo and flush ears due to cerumen impaction.  Will get CTA of head and neck given ongoing neck pain and re-eval.  Pt is now >24hrs into symptoms and not a code stroke.  5:53 PM CTA of head and neck showed no acute findings.  There is some chronic small vessel ischemic changes but no evidence of large vessel occlusion.  There is minimal carotid bifurcation atherosclerosis on the right but no stenosis on either side.  Vertebral artery and posterior circulation are normal.  On reevaluation patient feels better after having her ears flushed and cerumen removed as well as getting meclizine.  She is able to stand and states she does not feel dizzy at this time.  Suspect this is most likely peripheral vertigo.  Patient was given strict return precautions if she develops any ataxia, unilateral weakness numbness, speech issues or other strokelike symptoms.  Patient voices understanding.  She is comfortable with discharge home.  She will follow up with her doctor if the dizziness does not resolve.  MDM Number of Diagnoses or Management Options   Amount and/or Complexity of Data Reviewed Clinical lab tests: ordered and reviewed Tests in the radiology section of CPT: ordered and reviewed Tests in the medicine section of CPT: ordered and reviewed Decide to obtain previous medical records or to obtain history from someone other than the patient: yes Obtain history from someone other than the patient:  no Review and summarize past medical records: yes Discuss the patient with other providers: no Independent visualization of images, tracings, or specimens: yes  Risk of Complications, Morbidity, and/or Mortality Presenting problems: moderate Diagnostic procedures: low Management options: low  Patient Progress Patient progress: stable     Final Clinical Impression(s) / ED Diagnoses Final diagnoses:  Vertigo  Bilateral impacted cerumen    Rx / DC Orders ED Discharge Orders          Ordered    meclizine (ANTIVERT) 25 MG tablet  3 times daily PRN        07/29/20 1718           Blanchie Dessert, MD 07/29/20 1755

## 2020-07-29 NOTE — ED Notes (Signed)
Patient transported to CT 

## 2020-07-29 NOTE — Discharge Instructions (Signed)
CAT scan of the neck and head today show no signs of stroke or blood vessel problems.  The dizziness is most likely related to an inner ear problem however if you develop weakness on 1 side, trouble speaking or walking you need to seek care immediately.

## 2020-07-29 NOTE — ED Triage Notes (Signed)
Pt c/o lightheaded and dizziness onset yesterday.

## 2020-07-31 ENCOUNTER — Encounter: Payer: Medicare HMO | Admitting: Physical Therapy

## 2020-08-02 ENCOUNTER — Encounter: Payer: Medicare HMO | Admitting: Physical Therapy

## 2020-08-02 ENCOUNTER — Other Ambulatory Visit (INDEPENDENT_AMBULATORY_CARE_PROVIDER_SITE_OTHER): Payer: Medicare HMO

## 2020-08-02 ENCOUNTER — Other Ambulatory Visit: Payer: Self-pay

## 2020-08-02 DIAGNOSIS — R739 Hyperglycemia, unspecified: Secondary | ICD-10-CM

## 2020-08-03 ENCOUNTER — Other Ambulatory Visit: Payer: Self-pay | Admitting: Family Medicine

## 2020-08-03 LAB — BASIC METABOLIC PANEL
BUN: 16 mg/dL (ref 7–25)
CO2: 31 mmol/L (ref 20–32)
Calcium: 9.5 mg/dL (ref 8.6–10.4)
Chloride: 102 mmol/L (ref 98–110)
Creat: 0.73 mg/dL (ref 0.60–0.93)
Glucose, Bld: 105 mg/dL — ABNORMAL HIGH (ref 65–99)
Potassium: 3.7 mmol/L (ref 3.5–5.3)
Sodium: 142 mmol/L (ref 135–146)

## 2020-08-03 LAB — HEMOGLOBIN A1C
Hgb A1c MFr Bld: 5.9 % of total Hgb — ABNORMAL HIGH (ref <5.7)
Mean Plasma Glucose: 123 (calc)
eAG (mmol/L): 6.8 (calc)

## 2020-08-21 ENCOUNTER — Ambulatory Visit: Payer: Medicare HMO | Attending: Orthopaedic Surgery | Admitting: Physical Therapy

## 2020-08-21 ENCOUNTER — Other Ambulatory Visit: Payer: Self-pay

## 2020-08-21 ENCOUNTER — Encounter: Payer: Self-pay | Admitting: Physical Therapy

## 2020-08-21 DIAGNOSIS — M545 Low back pain, unspecified: Secondary | ICD-10-CM

## 2020-08-21 DIAGNOSIS — M546 Pain in thoracic spine: Secondary | ICD-10-CM | POA: Diagnosis not present

## 2020-08-21 DIAGNOSIS — M542 Cervicalgia: Secondary | ICD-10-CM

## 2020-08-21 NOTE — Therapy (Signed)
[place], [place], [address] Phone: [phone]   Fax:  [phone]  Physical Therapy Evaluation  Patient Details  Name: Cindy Velasquez MRN: [id number] Date of Birth: 01-28-1941 Referring Provider (PT): Etter Sjogren   Encounter Date: 08/21/2020   PT End of Session - 08/21/20 1127    Visit Number 1    Date for PT Re-Evaluation 10/21/20    Authorization Type [employer]    PT Start Time 1055    PT Stop Time 1150    PT Time Calculation (min) 55 min    Activity Tolerance Patient tolerated treatment well    Behavior During Therapy [facility] for tasks assessed/performed           Past Medical History:  Diagnosis Date  . Adenomatous colon polyp   . Cancer ([place]) 03/2015   BCC R tibia   . Diverticulosis of colon   . GERD (gastroesophageal reflux disease)   . Hip pain   . Hyperlipidemia   . Hyperplastic colon polyp   . Hypertension   . Lactose intolerance   . Loose stools   . Lower back pain   . Osteoporosis   . Palpitations     Past Surgical History:  Procedure Laterality Date  . EYE SURGERY  2012   B/L 01/2011  02/2011  . HAMMER TOE SURGERY     Bilateral  . ROTATOR CUFF REPAIR     Left  . TUBAL LIGATION      There were no vitals filed for this visit.    Subjective Assessment - 08/21/20 1104    Subjective Patient was seen here earlier in the year for left tibial fracture.  We saw her and she got much better and is reporting no issues.  She returns today with MD order to eval and treat the neck and low back, she reports that she has had issues for years but she had to change her walking and that has caused her increase neck and back pain, imaging from 2019 shows narrowing and degenerative changes    Limitations Lifting;House hold activities    Patient Stated Goals have less pain    Currently in Pain? Yes    Pain Score 6     Pain Location Back   neck   Pain Orientation Lower;Upper;Mid    Pain  Descriptors / Indicators Aching;Sharp;Tightness    Pain Type Acute pain    Pain Radiating Towards denies    Pain Onset More than a month ago    Pain Frequency Constant    Aggravating Factors  activity, standing on hard surfaces, reports just really it is always there, difficulty sleeping, pain up to 8/10    Pain Relieving Factors take OTC pain meds, heat at best pain a 2-3/10    Effect of Pain on Daily Activities difficulty sleeping, just always hurts              [facility] PT Assessment - 08/21/20 0001      Assessment   Medical Diagnosis neck and back pain    Referring Provider (PT) Etter Sjogren    Onset Date/Surgical Date 07/21/20    Prior Therapy for the leg fracture earlier this year      Precautions   Precautions None      Balance Screen   Has the patient fallen in the past 6 months Yes    How many times? 1    Has the patient had a decrease in  activity level because of a fear of falling?  No    Is the patient reluctant to leave their home because of a fear of falling?  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, wome walking    Leisure going to the [employer] 2-3 x/week, chair yoga and other classes, has not returned      Posture/Postural Control   Posture Comments mild forward head, and rounded shoulders, decreased lordosis      ROM / Strength   AROM / PROM / Strength AROM;Strength      AROM   Overall AROM Comments cervical ROM is decreased 50% for all motions except extensiona nd side bending is decreased 75%, Lumbar flexion WNL's, extension and side benidng is decreased 75% with upper/mid back pain      Strength   Overall Strength Comments Shoulders 4-/5, hips 4-/5 with mild pain in the upper back      Palpation   Palpation comment she is very tight in the upper traps, the  neck and the rhomboid area, mild tightness in the lumbar area                      Objective measurements completed on examination: See above findings.       [facility] PT Treatment/Exercise - 08/21/20 0001      Modalities   Modalities Electrical Stimulation;Moist Heat      Moist Heat Therapy   Number Minutes Moist Heat 12 Minutes    Moist Heat Location Lumbar Spine;Cervical      Electrical Stimulation   Electrical Stimulation Location upper back/cervical    Electrical Stimulation Action IFC    Electrical Stimulation Parameters supine    Electrical Stimulation Goals Pain                    PT Short Term Goals - 08/21/20 1132      PT SHORT TERM GOAL #1   Title independent with initial HEP    Time 2    Period Weeks             PT Long Term Goals - 08/21/20 1132      PT LONG TERM GOAL #1   Title increase cervical ROM 25% for easier backing up of the car    Time 8    Period Weeks    Status New      PT LONG TERM GOAL #2   Title increase lumbar ROM 25%    Time 8    Period Weeks    Status New      PT LONG TERM GOAL #3   Title decrease pain 50%    Time 8    Period Weeks    Status New      PT LONG TERM GOAL #4   Title report no difficulty sleeping    Time 8    Period Weeks    Status New                  Plan - 08/21/20 1127    Clinical Impression Statement Patient was seen here earlier in the year for a tibia fracture and difficulty walking, we have this resolved and she is doing well  with the leg, she reports that she has had many years of neck and back pain, x-rays in 2019 show DDD and narrowing in the cervical and lumbar spines, she thinks that the way she had to walk with the tibia fracture exacerbated the neck and back pain.  She has very limited ROM in the cervical spine, and is limited with extension and side bending with the lumbar spine, the mms of the neck and back are very tight and tender.    Stability/Clinical  Decision Making Stable/Uncomplicated    Clinical Decision Making Low    Rehab Potential Good    PT Frequency 2x / week    PT Duration 8 weeks    PT Treatment/Interventions ADLs/Self Care Home Management;Electrical Stimulation;Therapeutic activities;Therapeutic exercise;Neuromuscular re-education;Manual techniques;Patient/family education;Moist Heat;Traction;Dry needling    PT Next Visit Plan Begin treatment of the cervical and lumbar spines    Consulted and Agree with Plan of Care Patient           Patient will benefit from skilled therapeutic intervention in order to improve the following deficits and impairments:  Decreased range of motion, Decreased activity tolerance, Pain, Impaired flexibility, Decreased strength, Decreased mobility, Decreased balance, Postural dysfunction, Improper body mechanics, Increased muscle spasms  Visit Diagnosis: Cervicalgia - Plan: PT plan of care cert/re-cert  Pain in thoracic spine - Plan: PT plan of care cert/re-cert  Acute bilateral low back pain without sciatica - Plan: PT plan of care cert/re-cert     Problem List Patient Active Problem List   Diagnosis Date Noted  . Thoracic stenosis 07/27/2020  . Dyslipidemia 07/27/2020  . Prediabetes 12/13/2018  . Vitamin D deficiency 12/13/2018  . Overweight (BMI 25.0-29.9) 12/13/2018  . Preventative health care 09/15/2018  . Bilateral cold feet 06/17/2018  . Idiopathic peripheral neuropathy 06/17/2018  . Lower extremity edema 06/17/2018  . Right shoulder pain 08/20/2016  . Knee pain, bilateral 04/23/2015  . Urticaria 10/13/2014  . Neuropathy 10/13/2014  . Fatigue 10/13/2014  . Abrasion of face 08/01/2013  . Rib pain on left side 07/25/2013  . Tinea cruris 06/22/2012  . Eustachian tube dysfunction 04/07/2012  . Cerumen impaction 04/07/2012  . URI 10/30/2010  . CHANGE IN BOWELS 04/03/2010  . FECAL OCCULT BLOOD 04/03/2010  . PERSONAL HX COLONIC POLYPS 04/03/2010  . DYSPEPSIA 03/04/2010  .  GUAIAC POSITIVE STOOL 03/04/2010  . HEMATURIA UNSPECIFIED 06/28/2009  . DYSURIA 06/28/2009  . HAMMER TOE 12/28/2008  . POSTMENOPAUSAL STATUS 12/28/2008  . BACK PAIN, LUMBAR 07/11/2008  . BENIGN NEOPLASM OF SKIN SITE UNSPECIFIED 02/28/2008  . Hyperlipidemia 04/15/2007  . Essential hypertension 04/15/2007  . DIVERTICULOSIS, COLON 04/15/2007  . THUMB PAIN, RIGHT 04/15/2007  . OSTEOPOROSIS 04/15/2007    Sumner Boast., PT 08/21/2020, 11:35 AM  [place], [place], [address] Phone: [phone]   Fax:  [phone]  Name: Cindy Velasquez MRN: [id number] Date of Birth: [birth year]

## 2020-08-21 NOTE — Patient Instructions (Signed)
Access Code: [email or web address] URL: [email or web address] Date: 08/21/2020 Prepared by: Lum Babe  Exercises Supine Lower Trunk Rotation - 2 x daily - 7 x weekly - 1 sets - 10 reps - 10 hold Supine Piriformis Stretch with Foot on Ground - 2 x daily - 7 x weekly - 1 sets - 10 reps - 10 hold Seated Shoulder Shrugs - 2 x daily - 7 x weekly - 1 sets - 10 reps - 3 hold Seated Scapular Retraction - 2 x daily - 7 x weekly - 1 sets - 10 reps - 3 hold

## 2020-08-23 ENCOUNTER — Telehealth: Payer: Self-pay | Admitting: Family Medicine

## 2020-08-23 NOTE — Progress Notes (Signed)
°  Chronic Care Management   Note  08/23/2020 Name: Cindy Velasquez MRN: [id number] DOB: 1941/09/29  Cindy Velasquez is a 79 y.o. year old female who is a primary care patient of Ann Held, DO. I reached out to [employer] by phone today in response to a referral sent by Ms. Carmon Ginsberg Mcshan's PCP, Ann Held, DO.   Ms. Foucher was given information about Chronic Care Management services today including:  1. CCM service includes personalized support from designated clinical staff supervised by her physician, including individualized plan of care and coordination with other care providers 2. 24/7 contact phone numbers for assistance for urgent and routine care needs. 3. Service will only be billed when office clinical staff spend 20 minutes or more in a month to coordinate care. 4. Only one practitioner may furnish and bill the service in a calendar month. 5. The patient may stop CCM services at any time (effective at the end of the month) by phone call to the office staff.   Patient agreed to services and verbal consent obtained.   Follow up plan:   Carley Perdue [employer] Scheduler

## 2020-08-24 ENCOUNTER — Ambulatory Visit: Payer: Medicare HMO | Admitting: Physical Therapy

## 2020-08-24 ENCOUNTER — Other Ambulatory Visit: Payer: Self-pay

## 2020-08-24 ENCOUNTER — Encounter: Payer: Self-pay | Admitting: Physical Therapy

## 2020-08-24 DIAGNOSIS — M545 Low back pain, unspecified: Secondary | ICD-10-CM

## 2020-08-24 DIAGNOSIS — M542 Cervicalgia: Secondary | ICD-10-CM

## 2020-08-24 DIAGNOSIS — M546 Pain in thoracic spine: Secondary | ICD-10-CM

## 2020-08-24 NOTE — Therapy (Signed)
[place] [place], [place], [address] Phone: [phone]   Fax:  [phone]  Physical Therapy Treatment  Patient Details  Name: Cindy Velasquez MRN: [id number] Date of Birth: January 02, 1941 Referring Provider (PT): Etter Sjogren   Encounter Date: 08/24/2020   PT End of Session - 08/24/20 1141    Visit Number 2    Date for PT Re-Evaluation 10/21/20    Authorization Type [employer]    PT Start Time 1050    PT Stop Time 1145    PT Time Calculation (min) 55 min    Activity Tolerance Patient tolerated treatment well    Behavior During Therapy [facility] for tasks assessed/performed           Past Medical History:  Diagnosis Date  . Adenomatous colon polyp   . Cancer ([place]) 03/2015   BCC R tibia   . Diverticulosis of colon   . GERD (gastroesophageal reflux disease)   . Hip pain   . Hyperlipidemia   . Hyperplastic colon polyp   . Hypertension   . Lactose intolerance   . Loose stools   . Lower back pain   . Osteoporosis   . Palpitations     Past Surgical History:  Procedure Laterality Date  . EYE SURGERY  2012   B/L 01/2011  02/2011  . HAMMER TOE SURGERY     Bilateral  . ROTATOR CUFF REPAIR     Left  . TUBAL LIGATION      There were no vitals filed for this visit.   Subjective Assessment - 08/24/20 1051    Subjective I am feeling a little better    Currently in Pain? Yes    Pain Score 5     Pain Location Back                             [facility] Adult PT Treatment/Exercise - 08/24/20 0001      Knee/Hip Exercises: Stretches   Passive Hamstring Stretch Both;4 reps;20 seconds    Piriformis Stretch Both;4 reps;20 seconds      Knee/Hip Exercises: Aerobic   Nustep L5 x 5 min      Knee/Hip Exercises: Machines for Strengthening   Other Machine lats 15#, seated row 10# 2x10      Knee/Hip Exercises: Standing   Other Standing Knee Exercises red tband shoulder ER 2x10      Knee/Hip Exercises: Supine    Other Supine Knee/Hip Exercises fet on ball K2C, rotaiton, small bridges and isometric abs 2x10      Modalities   Modalities Electrical Stimulation;Moist Heat      Moist Heat Therapy   Number Minutes Moist Heat 12 Minutes    Moist Heat Location Lumbar Spine;Cervical      Electrical Stimulation   Electrical Stimulation Location upper back/cervical    Electrical Stimulation Action IFC    Electrical Stimulation Parameters supine    Electrical Stimulation Goals Pain                    PT Short Term Goals - 08/21/20 1132      PT SHORT TERM GOAL #1   Title independent with initial HEP    Time 2    Period Weeks             PT Long Term Goals - 08/21/20 1132      PT LONG TERM GOAL #1   Title  increase cervical ROM 25% for easier backing up of the car    Time 8    Period Weeks    Status New      PT LONG TERM GOAL #2   Title increase lumbar ROM 25%    Time 8    Period Weeks    Status New      PT LONG TERM GOAL #3   Title decrease pain 50%    Time 8    Period Weeks    Status New      PT LONG TERM GOAL #4   Title report no difficulty sleeping    Time 8    Period Weeks    Status New                 Plan - 08/24/20 1148    Clinical Impression Statement Patient doing well, she tolerated the exercises without issue, does need cues for form and to get the core engaged.  Pain is mostly in the upper back area    PT Next Visit Plan Begin treatment of the cervical and lumbar spines    Consulted and Agree with Plan of Care Patient           Patient will benefit from skilled therapeutic intervention in order to improve the following deficits and impairments:  Decreased range of motion, Decreased activity tolerance, Pain, Impaired flexibility, Decreased strength, Decreased mobility, Decreased balance, Postural dysfunction, Improper body mechanics, Increased muscle spasms  Visit Diagnosis: Cervicalgia  Pain in thoracic spine  Acute bilateral low back  pain without sciatica     Problem List Patient Active Problem List   Diagnosis Date Noted  . Thoracic stenosis 07/27/2020  . Dyslipidemia 07/27/2020  . Prediabetes 12/13/2018  . Vitamin D deficiency 12/13/2018  . Overweight (BMI 25.0-29.9) 12/13/2018  . Preventative health care 09/15/2018  . Bilateral cold feet 06/17/2018  . Idiopathic peripheral neuropathy 06/17/2018  . Lower extremity edema 06/17/2018  . Right shoulder pain 08/20/2016  . Knee pain, bilateral 04/23/2015  . Urticaria 10/13/2014  . Neuropathy 10/13/2014  . Fatigue 10/13/2014  . Abrasion of face 08/01/2013  . Rib pain on left side 07/25/2013  . Tinea cruris 06/22/2012  . Eustachian tube dysfunction 04/07/2012  . Cerumen impaction 04/07/2012  . URI 10/30/2010  . CHANGE IN BOWELS 04/03/2010  . FECAL OCCULT BLOOD 04/03/2010  . PERSONAL HX COLONIC POLYPS 04/03/2010  . DYSPEPSIA 03/04/2010  . GUAIAC POSITIVE STOOL 03/04/2010  . HEMATURIA UNSPECIFIED 06/28/2009  . DYSURIA 06/28/2009  . HAMMER TOE 12/28/2008  . POSTMENOPAUSAL STATUS 12/28/2008  . BACK PAIN, LUMBAR 07/11/2008  . BENIGN NEOPLASM OF SKIN SITE UNSPECIFIED 02/28/2008  . Hyperlipidemia 04/15/2007  . Essential hypertension 04/15/2007  . DIVERTICULOSIS, COLON 04/15/2007  . THUMB PAIN, RIGHT 04/15/2007  . OSTEOPOROSIS 04/15/2007    Sumner Boast., PT 08/24/2020, 11:51 AM  [place]. [place], [place], [address] Phone: [phone]   Fax:  [phone]  Name: Cindy Velasquez MRN: [id number] Date of Birth: 02-Dec-1941

## 2020-08-28 ENCOUNTER — Ambulatory Visit: Payer: Medicare HMO | Admitting: Physical Therapy

## 2020-08-29 ENCOUNTER — Ambulatory Visit: Payer: Medicare HMO | Admitting: Physical Therapy

## 2020-08-29 ENCOUNTER — Encounter: Payer: Self-pay | Admitting: Physical Therapy

## 2020-08-29 ENCOUNTER — Other Ambulatory Visit: Payer: Self-pay

## 2020-08-29 DIAGNOSIS — M546 Pain in thoracic spine: Secondary | ICD-10-CM | POA: Diagnosis not present

## 2020-08-29 DIAGNOSIS — M542 Cervicalgia: Secondary | ICD-10-CM | POA: Diagnosis not present

## 2020-08-29 DIAGNOSIS — M545 Low back pain, unspecified: Secondary | ICD-10-CM

## 2020-08-29 NOTE — Therapy (Signed)
[place] [place], [place], [address] Phone: [phone]   Fax:  [phone]  Physical Therapy Treatment  Patient Details  Name: Cindy Velasquez MRN: [id number] Date of Birth: [birth year] Referring Provider (PT): Etter Sjogren   Encounter Date: 08/29/2020   PT End of Session - 08/29/20 1337    Visit Number 3    Date for PT Re-Evaluation 10/21/20    Authorization Type [employer]    PT Start Time 6720    PT Stop Time 1350    PT Time Calculation (min) 52 min    Activity Tolerance Patient tolerated treatment well    Behavior During Therapy [facility] for tasks assessed/performed           Past Medical History:  Diagnosis Date  . Adenomatous colon polyp   . Cancer ([place]) 03/2015   BCC R tibia   . Diverticulosis of colon   . GERD (gastroesophageal reflux disease)   . Hip pain   . Hyperlipidemia   . Hyperplastic colon polyp   . Hypertension   . Lactose intolerance   . Loose stools   . Lower back pain   . Osteoporosis   . Palpitations     Past Surgical History:  Procedure Laterality Date  . EYE SURGERY  2012   B/L 01/2011  02/2011  . HAMMER TOE SURGERY     Bilateral  . ROTATOR CUFF REPAIR     Left  . TUBAL LIGATION      There were no vitals filed for this visit.   Subjective Assessment - 08/29/20 1259    Subjective feeling better some pain in the neck    Currently in Pain? Yes    Pain Score 5     Pain Location Neck                             [facility] Adult PT Treatment/Exercise - 08/29/20 0001      Knee/Hip Exercises: Stretches   Passive Hamstring Stretch Both;4 reps;20 seconds    Piriformis Stretch Both;4 reps;20 seconds      Knee/Hip Exercises: Aerobic   Nustep L4 x4 min     Other Aerobic UBE L2 x 2 min each       Knee/Hip Exercises: Machines for Strengthening   Other Machine lats 15#, seated row 10# 2x10      Knee/Hip Exercises: Standing   Other Standing Knee Exercises red tband  shoulder ER 2x10      Knee/Hip Exercises: Seated   Other Seated Knee/Hip Exercises neck retrctions 2x10       Knee/Hip Exercises: Supine   Other Supine Knee/Hip Exercises fet on ball K2C, rotaiton, small bridges and isometric abs 2x10      Modalities   Modalities Electrical Stimulation;Moist Heat      Moist Heat Therapy   Number Minutes Moist Heat 12 Minutes    Moist Heat Location Lumbar Spine;Cervical      Electrical Stimulation   Electrical Stimulation Location upper back/cervical    Electrical Stimulation Action premod    Electrical Stimulation Parameters supine    Electrical Stimulation Goals Pain                    PT Short Term Goals - 08/21/20 1132      PT SHORT TERM GOAL #1   Title independent with initial HEP    Time 2    Period Weeks  PT Long Term Goals - 08/21/20 1132      PT LONG TERM GOAL #1   Title increase cervical ROM 25% for easier backing up of the car    Time 8    Period Weeks    Status New      PT LONG TERM GOAL #2   Title increase lumbar ROM 25%    Time 8    Period Weeks    Status New      PT LONG TERM GOAL #3   Title decrease pain 50%    Time 8    Period Weeks    Status New      PT LONG TERM GOAL #4   Title report no difficulty sleeping    Time 8    Period Weeks    Status New                 Plan - 08/29/20 1338    Clinical Impression Statement Good carryover form previous treatment. Some RUE weakness noted with external rotation. Cues needed for core engagement with seated rows. Some difficulty with neck retractions getting the correct motion down. RLE HS tightness noted with passive stretching.    Stability/Clinical Decision Making Stable/Uncomplicated    Rehab Potential Good    PT Frequency 2x / week    PT Duration 8 weeks    PT Treatment/Interventions ADLs/Self Care Home Management;Electrical Stimulation;Therapeutic activities;Therapeutic exercise;Neuromuscular re-education;Manual  techniques;Patient/family education;Moist Heat;Traction;Dry needling    PT Next Visit Plan treatment of the cervical and lumbar spines           Patient will benefit from skilled therapeutic intervention in order to improve the following deficits and impairments:  Decreased range of motion, Decreased activity tolerance, Pain, Impaired flexibility, Decreased strength, Decreased mobility, Decreased balance, Postural dysfunction, Improper body mechanics, Increased muscle spasms  Visit Diagnosis: Cervicalgia  Pain in thoracic spine  Acute bilateral low back pain without sciatica     Problem List Patient Active Problem List   Diagnosis Date Noted  . Thoracic stenosis 07/27/2020  . Dyslipidemia 07/27/2020  . Prediabetes 12/13/2018  . Vitamin D deficiency 12/13/2018  . Overweight (BMI 25.0-29.9) 12/13/2018  . Preventative health care 09/15/2018  . Bilateral cold feet 06/17/2018  . Idiopathic peripheral neuropathy 06/17/2018  . Lower extremity edema 06/17/2018  . Right shoulder pain 08/20/2016  . Knee pain, bilateral 04/23/2015  . Urticaria 10/13/2014  . Neuropathy 10/13/2014  . Fatigue 10/13/2014  . Abrasion of face 08/01/2013  . Rib pain on left side 07/25/2013  . Tinea cruris 06/22/2012  . Eustachian tube dysfunction 04/07/2012  . Cerumen impaction 04/07/2012  . URI 10/30/2010  . CHANGE IN BOWELS 04/03/2010  . FECAL OCCULT BLOOD 04/03/2010  . PERSONAL HX COLONIC POLYPS 04/03/2010  . DYSPEPSIA 03/04/2010  . GUAIAC POSITIVE STOOL 03/04/2010  . HEMATURIA UNSPECIFIED 06/28/2009  . DYSURIA 06/28/2009  . HAMMER TOE 12/28/2008  . POSTMENOPAUSAL STATUS 12/28/2008  . BACK PAIN, LUMBAR 07/11/2008  . BENIGN NEOPLASM OF SKIN SITE UNSPECIFIED 02/28/2008  . Hyperlipidemia 04/15/2007  . Essential hypertension 04/15/2007  . DIVERTICULOSIS, COLON 04/15/2007  . THUMB PAIN, RIGHT 04/15/2007  . OSTEOPOROSIS 04/15/2007    Scot Jun, PTA 08/29/2020, 1:40 PM  [place] [place], [place], [address] Phone: [phone]   Fax:  [phone]  Name: LUCIE FRIEDLANDER MRN: [id number] Date of Birth: 1940-12-16

## 2020-08-30 ENCOUNTER — Encounter: Payer: Self-pay | Admitting: Physical Therapy

## 2020-08-30 ENCOUNTER — Ambulatory Visit: Payer: Medicare HMO | Admitting: Physical Therapy

## 2020-08-30 DIAGNOSIS — M542 Cervicalgia: Secondary | ICD-10-CM

## 2020-08-30 DIAGNOSIS — M545 Low back pain, unspecified: Secondary | ICD-10-CM

## 2020-08-30 DIAGNOSIS — M546 Pain in thoracic spine: Secondary | ICD-10-CM | POA: Diagnosis not present

## 2020-08-30 NOTE — Therapy (Signed)
[place]. [place], [place], [address] Phone: [phone]   Fax:  [phone]  Physical Therapy Treatment  Patient Details  Name: Cindy Velasquez MRN: [id number] Date of Birth: 02-Oct-1941 Referring Provider (PT): Etter Sjogren   Encounter Date: 08/30/2020   PT End of Session - 08/30/20 1217    Visit Number 4    Date for PT Re-Evaluation 10/21/20    Authorization Type [employer]    PT Start Time 1143    PT Stop Time 1235    PT Time Calculation (min) 52 min    Activity Tolerance Patient tolerated treatment well    Behavior During Therapy [facility] for tasks assessed/performed           Past Medical History:  Diagnosis Date  . Adenomatous colon polyp   . Cancer ([place]) 03/2015   BCC R tibia   . Diverticulosis of colon   . GERD (gastroesophageal reflux disease)   . Hip pain   . Hyperlipidemia   . Hyperplastic colon polyp   . Hypertension   . Lactose intolerance   . Loose stools   . Lower back pain   . Osteoporosis   . Palpitations     Past Surgical History:  Procedure Laterality Date  . EYE SURGERY  2012   B/L 01/2011  02/2011  . HAMMER TOE SURGERY     Bilateral  . ROTATOR CUFF REPAIR     Left  . TUBAL LIGATION      There were no vitals filed for this visit.   Subjective Assessment - 08/30/20 1143    Subjective Feeling well, no back pain at all. Some neck pain    Currently in Pain? Yes    Pain Score 5     Pain Location Neck                             [facility] Adult PT Treatment/Exercise - 08/30/20 0001      Knee/Hip Exercises: Aerobic   Nustep L4 x5 min     Other Aerobic UBE L2 x 2 min each       Knee/Hip Exercises: Machines for Strengthening   Other Machine lats 20#, seated row 20# 2x10, Shoulder Ext 5lb 2x10       Knee/Hip Exercises: Standing   Other Standing Knee Exercises red tband shoulder ER 2x10      Knee/Hip Exercises: Seated   Other Seated Knee/Hip Exercises neck retrctions  2x10       Modalities   Modalities Electrical Stimulation;Moist Heat      Moist Heat Therapy   Number Minutes Moist Heat 12 Minutes    Moist Heat Location Lumbar Spine;Cervical      Electrical Stimulation   Electrical Stimulation Location upper back/cervical    Electrical Stimulation Action pre mod    Electrical Stimulation Parameters supine    Electrical Stimulation Goals Pain                    PT Short Term Goals - 08/30/20 1223      PT SHORT TERM GOAL #1   Title independent with initial HEP    Status Achieved             PT Long Term Goals - 08/30/20 1223      PT LONG TERM GOAL #1   Title increase cervical ROM 25% for easier backing up of the car  Status On-going      PT LONG TERM GOAL #2   Title increase lumbar ROM 25%    Status On-going      PT LONG TERM GOAL #3   Title decrease pain 50%    Status Partially Met      PT LONG TERM GOAL #4   Title report no difficulty sleeping    Status Partially Met                 Plan - 08/30/20 1220    Clinical Impression Statement Pt did well overall today. Postural cues required with standing shoulder extensions. No reports of increase pain. She tolerated increase weight with seated rows and at pull downs. She has some bilateral upper trap tightness. Positive response to MT and modality.    Stability/Clinical Decision Making Stable/Uncomplicated    Rehab Potential Good    PT Frequency 2x / week    PT Duration 8 weeks    PT Treatment/Interventions ADLs/Self Care Home Management;Electrical Stimulation;Therapeutic activities;Therapeutic exercise;Neuromuscular re-education;Manual techniques;Patient/family education;Moist Heat;Traction;Dry needling    PT Next Visit Plan treatment of the cervical and lumbar spines           Patient will benefit from skilled therapeutic intervention in order to improve the following deficits and impairments:  Decreased range of motion, Decreased activity tolerance,  Pain, Impaired flexibility, Decreased strength, Decreased mobility, Decreased balance, Postural dysfunction, Improper body mechanics, Increased muscle spasms  Visit Diagnosis: Acute bilateral low back pain without sciatica  Pain in thoracic spine  Cervicalgia     Problem List Patient Active Problem List   Diagnosis Date Noted  . Thoracic stenosis 07/27/2020  . Dyslipidemia 07/27/2020  . Prediabetes 12/13/2018  . Vitamin D deficiency 12/13/2018  . Overweight (BMI 25.0-29.9) 12/13/2018  . Preventative health care 09/15/2018  . Bilateral cold feet 06/17/2018  . Idiopathic peripheral neuropathy 06/17/2018  . Lower extremity edema 06/17/2018  . Right shoulder pain 08/20/2016  . Knee pain, bilateral 04/23/2015  . Urticaria 10/13/2014  . Neuropathy 10/13/2014  . Fatigue 10/13/2014  . Abrasion of face 08/01/2013  . Rib pain on left side 07/25/2013  . Tinea cruris 06/22/2012  . Eustachian tube dysfunction 04/07/2012  . Cerumen impaction 04/07/2012  . URI 10/30/2010  . CHANGE IN BOWELS 04/03/2010  . FECAL OCCULT BLOOD 04/03/2010  . PERSONAL HX COLONIC POLYPS 04/03/2010  . DYSPEPSIA 03/04/2010  . GUAIAC POSITIVE STOOL 03/04/2010  . HEMATURIA UNSPECIFIED 06/28/2009  . DYSURIA 06/28/2009  . HAMMER TOE 12/28/2008  . POSTMENOPAUSAL STATUS 12/28/2008  . BACK PAIN, LUMBAR 07/11/2008  . BENIGN NEOPLASM OF SKIN SITE UNSPECIFIED 02/28/2008  . Hyperlipidemia 04/15/2007  . Essential hypertension 04/15/2007  . DIVERTICULOSIS, COLON 04/15/2007  . THUMB PAIN, RIGHT 04/15/2007  . OSTEOPOROSIS 04/15/2007    Scot Jun, PTA 08/30/2020, 12:23 PM  [place]. [place], [place], [address] Phone: [phone]   Fax:  [phone]  Name: Cindy Velasquez MRN: [id number] Date of Birth: 29-Nov-1941

## 2020-09-03 ENCOUNTER — Encounter: Payer: Medicare HMO | Admitting: Physical Therapy

## 2020-09-05 ENCOUNTER — Ambulatory Visit: Payer: Medicare HMO | Admitting: Physical Therapy

## 2020-09-05 ENCOUNTER — Other Ambulatory Visit: Payer: Self-pay

## 2020-09-05 ENCOUNTER — Encounter: Payer: Self-pay | Admitting: Physical Therapy

## 2020-09-05 DIAGNOSIS — M545 Low back pain, unspecified: Secondary | ICD-10-CM

## 2020-09-05 DIAGNOSIS — M546 Pain in thoracic spine: Secondary | ICD-10-CM | POA: Diagnosis not present

## 2020-09-05 DIAGNOSIS — M542 Cervicalgia: Secondary | ICD-10-CM

## 2020-09-05 NOTE — Therapy (Signed)
[place]. [place], [place], [address] Phone: [phone]   Fax:  [phone]  Physical Therapy Treatment  Patient Details  Name: Cindy Velasquez MRN: [id number] Date of Birth: Jun 29, 1941 Referring Provider (PT): Etter Sjogren   Encounter Date: 09/05/2020   PT End of Session - 09/05/20 1339    Visit Number 5    Date for PT Re-Evaluation 10/21/20    Authorization Type [employer]    PT Start Time 1300    PT Stop Time 2706    PT Time Calculation (min) 55 min    Activity Tolerance Patient tolerated treatment well    Behavior During Therapy [facility] for tasks assessed/performed           Past Medical History:  Diagnosis Date  . Adenomatous colon polyp   . Cancer ([place]) 03/2015   BCC R tibia   . Diverticulosis of colon   . GERD (gastroesophageal reflux disease)   . Hip pain   . Hyperlipidemia   . Hyperplastic colon polyp   . Hypertension   . Lactose intolerance   . Loose stools   . Lower back pain   . Osteoporosis   . Palpitations     Past Surgical History:  Procedure Laterality Date  . EYE SURGERY  2012   B/L 01/2011  02/2011  . HAMMER TOE SURGERY     Bilateral  . ROTATOR CUFF REPAIR     Left  . TUBAL LIGATION      There were no vitals filed for this visit.   Subjective Assessment - 09/05/20 1308    Subjective I have been feeling really well.  but today had some increaesd neck pain    Currently in Pain? Yes    Pain Score 5     Pain Location Neck    Aggravating Factors  maybe sleeping wrong                             [facility] Adult PT Treatment/Exercise - 09/05/20 0001      Knee/Hip Exercises: Stretches   Passive Hamstring Stretch Both;4 reps;20 seconds    Other Knee/Hip Stretches gentle upper trap and levator stretches      Knee/Hip Exercises: Aerobic   Nustep L4 x5 min     Other Aerobic UBE L2 x 2 min each       Knee/Hip Exercises: Machines for Strengthening   Other Machine lats 20#,  seated row 20# 2x10, Shoulder Ext 5lb 2x10       Knee/Hip Exercises: Standing   Other Standing Knee Exercises red tband shoulder ER 2x10      Knee/Hip Exercises: Supine   Other Supine Knee/Hip Exercises fet on ball K2C, rotaiton, small bridges and isometric abs 2x10      Modalities   Modalities Electrical Stimulation;Moist Heat      Moist Heat Therapy   Number Minutes Moist Heat 10 Minutes    Moist Heat Location Lumbar Spine;Cervical      Electrical Stimulation   Electrical Stimulation Location upper back/cervical    Electrical Stimulation Action IFC    Electrical Stimulation Parameters supine    Electrical Stimulation Goals Pain      Manual Therapy   Manual Therapy Soft tissue mobilization    Soft tissue mobilization bilateral upper trap and cervical area                    PT Short  Term Goals - 08/30/20 1223      PT SHORT TERM GOAL #1   Title independent with initial HEP    Status Achieved             PT Long Term Goals - 09/05/20 1341      PT LONG TERM GOAL #1   Title increase cervical ROM 25% for easier backing up of the car    Status Partially Met      PT LONG TERM GOAL #2   Title increase lumbar ROM 25%    Status Partially Met                 Plan - 09/05/20 1340    Clinical Impression Statement Patient is still very tight in the upper traps, reports less issues with the low back, she will be going to see a great grand child next weekend.  We talked some about posture and body mechanics and being careful with lifting    PT Next Visit Plan continue to work on strnegth to help her quality of life    Consulted and Agree with Plan of Care Patient           Patient will benefit from skilled therapeutic intervention in order to improve the following deficits and impairments:  Decreased range of motion, Decreased activity tolerance, Pain, Impaired flexibility, Decreased strength, Decreased mobility, Decreased balance, Postural dysfunction,  Improper body mechanics, Increased muscle spasms  Visit Diagnosis: Acute bilateral low back pain without sciatica  Pain in thoracic spine  Cervicalgia     Problem List Patient Active Problem List   Diagnosis Date Noted  . Thoracic stenosis 07/27/2020  . Dyslipidemia 07/27/2020  . Prediabetes 12/13/2018  . Vitamin D deficiency 12/13/2018  . Overweight (BMI 25.0-29.9) 12/13/2018  . Preventative health care 09/15/2018  . Bilateral cold feet 06/17/2018  . Idiopathic peripheral neuropathy 06/17/2018  . Lower extremity edema 06/17/2018  . Right shoulder pain 08/20/2016  . Knee pain, bilateral 04/23/2015  . Urticaria 10/13/2014  . Neuropathy 10/13/2014  . Fatigue 10/13/2014  . Abrasion of face 08/01/2013  . Rib pain on left side 07/25/2013  . Tinea cruris 06/22/2012  . Eustachian tube dysfunction 04/07/2012  . Cerumen impaction 04/07/2012  . URI 10/30/2010  . CHANGE IN BOWELS 04/03/2010  . FECAL OCCULT BLOOD 04/03/2010  . PERSONAL HX COLONIC POLYPS 04/03/2010  . DYSPEPSIA 03/04/2010  . GUAIAC POSITIVE STOOL 03/04/2010  . HEMATURIA UNSPECIFIED 06/28/2009  . DYSURIA 06/28/2009  . HAMMER TOE 12/28/2008  . POSTMENOPAUSAL STATUS 12/28/2008  . BACK PAIN, LUMBAR 07/11/2008  . BENIGN NEOPLASM OF SKIN SITE UNSPECIFIED 02/28/2008  . Hyperlipidemia 04/15/2007  . Essential hypertension 04/15/2007  . DIVERTICULOSIS, COLON 04/15/2007  . THUMB PAIN, RIGHT 04/15/2007  . OSTEOPOROSIS 04/15/2007    Sumner Boast., PT 09/05/2020, 1:43 PM  [place]. [place], [place], [address] Phone: [phone]   Fax:  [phone]  Name: DEMETRIA IWAI MRN: [id number] Date of Birth: 12-Nov-1941

## 2020-09-11 ENCOUNTER — Encounter: Payer: Self-pay | Admitting: Physical Therapy

## 2020-09-11 ENCOUNTER — Ambulatory Visit: Payer: Medicare HMO | Attending: Orthopaedic Surgery | Admitting: Physical Therapy

## 2020-09-11 ENCOUNTER — Other Ambulatory Visit: Payer: Self-pay

## 2020-09-11 DIAGNOSIS — M542 Cervicalgia: Secondary | ICD-10-CM | POA: Diagnosis not present

## 2020-09-11 DIAGNOSIS — M545 Low back pain, unspecified: Secondary | ICD-10-CM | POA: Diagnosis not present

## 2020-09-11 DIAGNOSIS — M546 Pain in thoracic spine: Secondary | ICD-10-CM | POA: Insufficient documentation

## 2020-09-11 NOTE — Therapy (Signed)
[place]. [place], [place], [address] Phone: [phone]   Fax:  [phone]  Physical Therapy Treatment  Patient Details  Name: Cindy Velasquez MRN: [id number] Date of Birth: July 21, 1941 Referring Provider (PT): Etter Sjogren   Encounter Date: 09/11/2020   PT End of Session - 09/11/20 1134    Visit Number 6    Date for PT Re-Evaluation 10/21/20    Authorization Type [employer]    PT Start Time 1053    PT Stop Time 1147    PT Time Calculation (min) 54 min    Activity Tolerance Patient tolerated treatment well    Behavior During Therapy [facility] for tasks assessed/performed           Past Medical History:  Diagnosis Date  . Adenomatous colon polyp   . Cancer ([place]) 03/2015   BCC R tibia   . Diverticulosis of colon   . GERD (gastroesophageal reflux disease)   . Hip pain   . Hyperlipidemia   . Hyperplastic colon polyp   . Hypertension   . Lactose intolerance   . Loose stools   . Lower back pain   . Osteoporosis   . Palpitations     Past Surgical History:  Procedure Laterality Date  . EYE SURGERY  2012   B/L 01/2011  02/2011  . HAMMER TOE SURGERY     Bilateral  . ROTATOR CUFF REPAIR     Left  . TUBAL LIGATION      There were no vitals filed for this visit.   Subjective Assessment - 09/11/20 1059    Subjective Patoietn reports that she felt really good after the last treatment, reports that her neck is what is bothering her today    Currently in Pain? Yes    Pain Score 7     Pain Location Neck    Aggravating Factors  unsure                             [facility] Adult PT Treatment/Exercise - 09/11/20 0001      Knee/Hip Exercises: Aerobic   Nustep L4 x6 min     Other Aerobic UBE L2 x 2 min each       Knee/Hip Exercises: Machines for Strengthening   Other Machine lats 20#, seated row 20# 2x10, Shoulder Ext 5lb 2x10       Knee/Hip Exercises: Standing   Other Standing Knee Exercises W backs  wiht 1#, overhead weighted ball lifts    Other Standing Knee Exercises red tband shoulder ER 2x10      Modalities   Modalities Electrical Stimulation;Moist Heat      Moist Heat Therapy   Number Minutes Moist Heat 10 Minutes    Moist Heat Location Lumbar Spine;Cervical      Electrical Stimulation   Electrical Stimulation Location upper back/cervical    Electrical Stimulation Action IFC    Electrical Stimulation Parameters supine    Electrical Stimulation Goals Pain      Manual Therapy   Manual Therapy Soft tissue mobilization    Soft tissue mobilization bilateral upper trap and cervical area                    PT Short Term Goals - 08/30/20 1223      PT SHORT TERM GOAL #1   Title independent with initial HEP    Status Achieved  PT Long Term Goals - 09/05/20 1341      PT LONG TERM GOAL #1   Title increase cervical ROM 25% for easier backing up of the car    Status Partially Met      PT LONG TERM GOAL #2   Title increase lumbar ROM 25%    Status Partially Met                 Plan - 09/11/20 1134    Clinical Impression Statement Patient reports that her low back is feeling better, reports that after the last treatment the neck felt "really good" but then pain comes back, she was able to do the exercises without increae of pain, she is tight and tender inthe upper traps with knots  and some in the rhomboids.  She will see [place] again prior to going to visit her grandchild this weekend    PT Next Visit Plan may need to talk about body mechanics to decrease the stress iwth caring for grandchild    Consulted and Agree with Plan of Care Patient           Patient will benefit from skilled therapeutic intervention in order to improve the following deficits and impairments:  Decreased range of motion, Decreased activity tolerance, Pain, Impaired flexibility, Decreased strength, Decreased mobility, Decreased balance, Postural dysfunction, Improper  body mechanics, Increased muscle spasms  Visit Diagnosis: Acute bilateral low back pain without sciatica  Pain in thoracic spine  Cervicalgia     Problem List Patient Active Problem List   Diagnosis Date Noted  . Thoracic stenosis 07/27/2020  . Dyslipidemia 07/27/2020  . Prediabetes 12/13/2018  . Vitamin D deficiency 12/13/2018  . Overweight (BMI 25.0-29.9) 12/13/2018  . Preventative health care 09/15/2018  . Bilateral cold feet 06/17/2018  . Idiopathic peripheral neuropathy 06/17/2018  . Lower extremity edema 06/17/2018  . Right shoulder pain 08/20/2016  . Knee pain, bilateral 04/23/2015  . Urticaria 10/13/2014  . Neuropathy 10/13/2014  . Fatigue 10/13/2014  . Abrasion of face 08/01/2013  . Rib pain on left side 07/25/2013  . Tinea cruris 06/22/2012  . Eustachian tube dysfunction 04/07/2012  . Cerumen impaction 04/07/2012  . URI 10/30/2010  . CHANGE IN BOWELS 04/03/2010  . FECAL OCCULT BLOOD 04/03/2010  . PERSONAL HX COLONIC POLYPS 04/03/2010  . DYSPEPSIA 03/04/2010  . GUAIAC POSITIVE STOOL 03/04/2010  . HEMATURIA UNSPECIFIED 06/28/2009  . DYSURIA 06/28/2009  . HAMMER TOE 12/28/2008  . POSTMENOPAUSAL STATUS 12/28/2008  . BACK PAIN, LUMBAR 07/11/2008  . BENIGN NEOPLASM OF SKIN SITE UNSPECIFIED 02/28/2008  . Hyperlipidemia 04/15/2007  . Essential hypertension 04/15/2007  . DIVERTICULOSIS, COLON 04/15/2007  . THUMB PAIN, RIGHT 04/15/2007  . OSTEOPOROSIS 04/15/2007    Sumner Boast., PT 09/11/2020, 11:37 AM  [place]. [place], [place], [address] Phone: [phone]   Fax:  [phone]  Name: Cindy Velasquez MRN: [id number] Date of Birth: 17-May-1941

## 2020-09-14 ENCOUNTER — Other Ambulatory Visit: Payer: Self-pay

## 2020-09-14 ENCOUNTER — Encounter: Payer: Self-pay | Admitting: Physical Therapy

## 2020-09-14 ENCOUNTER — Ambulatory Visit: Payer: Medicare HMO | Admitting: Physical Therapy

## 2020-09-14 DIAGNOSIS — M542 Cervicalgia: Secondary | ICD-10-CM

## 2020-09-14 DIAGNOSIS — M545 Low back pain, unspecified: Secondary | ICD-10-CM | POA: Diagnosis not present

## 2020-09-14 DIAGNOSIS — M546 Pain in thoracic spine: Secondary | ICD-10-CM | POA: Diagnosis not present

## 2020-09-14 NOTE — Therapy (Signed)
[place]. [place], [place], [address] Phone: [phone]   Fax:  [phone]  Physical Therapy Treatment  Patient Details  Name: Cindy Velasquez MRN: [id number] Date of Birth: Oct 15, 1941 Referring Provider (PT): Etter Sjogren   Encounter Date: 09/14/2020   PT End of Session - 09/14/20 1042    Visit Number 7    Number of Visits 12    Date for PT Re-Evaluation 10/21/20    Authorization Type [employer]    PT Start Time 1003    PT Stop Time 1058    PT Time Calculation (min) 55 min    Activity Tolerance Patient tolerated treatment well    Behavior During Therapy [facility] for tasks assessed/performed           Past Medical History:  Diagnosis Date  . Adenomatous colon polyp   . Cancer ([place]) 03/2015   BCC R tibia   . Diverticulosis of colon   . GERD (gastroesophageal reflux disease)   . Hip pain   . Hyperlipidemia   . Hyperplastic colon polyp   . Hypertension   . Lactose intolerance   . Loose stools   . Lower back pain   . Osteoporosis   . Palpitations     Past Surgical History:  Procedure Laterality Date  . EYE SURGERY  2012   B/L 01/2011  02/2011  . HAMMER TOE SURGERY     Bilateral  . ROTATOR CUFF REPAIR     Left  . TUBAL LIGATION      There were no vitals filed for this visit.   Subjective Assessment - 09/14/20 1005    Subjective PAtient reports that she went to her grandsons football game last night and was surprised that she did not have increased pain with sitting on the bleacher, "I felt really good about it"    Currently in Pain? Yes    Pain Location Neck    Pain Relieving Factors the treatment seems to be helping                             [facility] Adult PT Treatment/Exercise - 09/14/20 0001      Knee/Hip Exercises: Stretches   Passive Hamstring Stretch Both;4 reps;20 seconds    Piriformis Stretch Both;4 reps;20 seconds      Knee/Hip Exercises: Aerobic   Nustep L4 x6 min      Other Aerobic UBE L2 x 2 min each       Knee/Hip Exercises: Machines for Strengthening   Other Machine lats 20#, seated row 20# 2x10, Shoulder Ext 5lb 2x10       Knee/Hip Exercises: Standing   Other Standing Knee Exercises W backs wiht 1#, overhead weighted ball lifts, 4# shrugs with upper trap and levator stretches    Other Standing Knee Exercises red tband shoulder ER 2x10      Knee/Hip Exercises: Supine   Other Supine Knee/Hip Exercises fet on ball K2C, rotaiton, small bridges and isometric abs 2x10      Modalities   Modalities Electrical Stimulation;Moist Heat      Moist Heat Therapy   Number Minutes Moist Heat 10 Minutes    Moist Heat Location Lumbar Spine;Cervical      Electrical Stimulation   Electrical Stimulation Location upper back/cervical    Electrical Stimulation Action IFC    Electrical Stimulation Parameters supine    Electrical Stimulation Goals Pain      Manual  Therapy   Manual Therapy Soft tissue mobilization    Soft tissue mobilization bilateral upper trap and cervical area                    PT Short Term Goals - 08/30/20 1223      PT SHORT TERM GOAL #1   Title independent with initial HEP    Status Achieved             PT Long Term Goals - 09/14/20 1044      PT LONG TERM GOAL #1   Title increase cervical ROM 25% for easier backing up of the car    Status Partially Met      PT LONG TERM GOAL #2   Title increase lumbar ROM 25%    Status Achieved                 Plan - 09/14/20 1043    Clinical Impression Statement Patient reports that she feels like the treatment is helping, able to sit on bleachers last night without increase of pain, she reports feeling stronger, still needs verbal and tactile cues for core activation, much better with flexibility    PT Next Visit Plan may need to talk about body mechanics to decrease the stress iwth caring for grandchild    Consulted and Agree with Plan of Care Patient            Patient will benefit from skilled therapeutic intervention in order to improve the following deficits and impairments:  Decreased range of motion, Decreased activity tolerance, Pain, Impaired flexibility, Decreased strength, Decreased mobility, Decreased balance, Postural dysfunction, Improper body mechanics, Increased muscle spasms  Visit Diagnosis: Acute bilateral low back pain without sciatica  Pain in thoracic spine  Cervicalgia     Problem List Patient Active Problem List   Diagnosis Date Noted  . Thoracic stenosis 07/27/2020  . Dyslipidemia 07/27/2020  . Prediabetes 12/13/2018  . Vitamin D deficiency 12/13/2018  . Overweight (BMI 25.0-29.9) 12/13/2018  . Preventative health care 09/15/2018  . Bilateral cold feet 06/17/2018  . Idiopathic peripheral neuropathy 06/17/2018  . Lower extremity edema 06/17/2018  . Right shoulder pain 08/20/2016  . Knee pain, bilateral 04/23/2015  . Urticaria 10/13/2014  . Neuropathy 10/13/2014  . Fatigue 10/13/2014  . Abrasion of face 08/01/2013  . Rib pain on left side 07/25/2013  . Tinea cruris 06/22/2012  . Eustachian tube dysfunction 04/07/2012  . Cerumen impaction 04/07/2012  . URI 10/30/2010  . CHANGE IN BOWELS 04/03/2010  . FECAL OCCULT BLOOD 04/03/2010  . PERSONAL HX COLONIC POLYPS 04/03/2010  . DYSPEPSIA 03/04/2010  . GUAIAC POSITIVE STOOL 03/04/2010  . HEMATURIA UNSPECIFIED 06/28/2009  . DYSURIA 06/28/2009  . HAMMER TOE 12/28/2008  . POSTMENOPAUSAL STATUS 12/28/2008  . BACK PAIN, LUMBAR 07/11/2008  . BENIGN NEOPLASM OF SKIN SITE UNSPECIFIED 02/28/2008  . Hyperlipidemia 04/15/2007  . Essential hypertension 04/15/2007  . DIVERTICULOSIS, COLON 04/15/2007  . THUMB PAIN, RIGHT 04/15/2007  . OSTEOPOROSIS 04/15/2007    Sumner Boast., PT 09/14/2020, 10:45 AM  [place]. [place], [place], [address] Phone: [phone]   Fax:  [phone]  Name:  Cindy Velasquez MRN: [id number] Date of Birth: 08/29/1941

## 2020-09-17 ENCOUNTER — Encounter: Payer: Medicare HMO | Admitting: Family Medicine

## 2020-09-17 ENCOUNTER — Ambulatory Visit: Payer: Medicare HMO | Admitting: *Deleted

## 2020-09-24 ENCOUNTER — Other Ambulatory Visit: Payer: Self-pay

## 2020-09-24 ENCOUNTER — Encounter: Payer: Self-pay | Admitting: Family Medicine

## 2020-09-24 ENCOUNTER — Ambulatory Visit (INDEPENDENT_AMBULATORY_CARE_PROVIDER_SITE_OTHER): Payer: Medicare HMO | Admitting: Family Medicine

## 2020-09-24 ENCOUNTER — Ambulatory Visit: Payer: Medicare HMO | Admitting: *Deleted

## 2020-09-24 VITALS — BP 118/60 | HR 81 | Temp 97.7°F | Resp 18 | Ht 59.0 in | Wt 151.8 lb

## 2020-09-24 DIAGNOSIS — E2839 Other primary ovarian failure: Secondary | ICD-10-CM | POA: Diagnosis not present

## 2020-09-24 DIAGNOSIS — Z0000 Encounter for general adult medical examination without abnormal findings: Secondary | ICD-10-CM | POA: Diagnosis not present

## 2020-09-24 DIAGNOSIS — I10 Essential (primary) hypertension: Secondary | ICD-10-CM | POA: Diagnosis not present

## 2020-09-24 DIAGNOSIS — E785 Hyperlipidemia, unspecified: Secondary | ICD-10-CM

## 2020-09-24 DIAGNOSIS — Z23 Encounter for immunization: Secondary | ICD-10-CM

## 2020-09-24 NOTE — Progress Notes (Signed)
Subjective:     Cindy Velasquez is a 79 y.o. female and is here for a comprehensive physical exam. The patient reports no problems. She also needs f/u cholesterol and bp and hyperglycemia.    Social History   Socioeconomic History  . Marital status: Married    Spouse name: Chavie Kolinski  . Number of children: 4  . Years of education: Not on file  . Highest education level: Not on file  Occupational History  . Occupation: retired--[employer]  Tobacco Use  . Smoking status: Former Smoker    Packs/day: 0.50    Years: 30.00    Pack years: 15.00    Types: Cigarettes    Quit date: 06/02/1991    Years since quitting: 29.3  . Smokeless tobacco: Never Used  Vaping Use  . Vaping Use: Never used  Substance and Sexual Activity  . Alcohol use: Yes    Alcohol/week: 5.0 standard drinks    Types: 5 Glasses of wine per week  . Drug use: No  . Sexual activity: Not Currently    Partners: Male  Other Topics Concern  . Not on file  Social History Narrative   Exercise--  [employer]--yoga and cardio 3x a week   Social Determinants of Health   Financial Resource Strain:   . Difficulty of Paying Living Expenses: Not on file  Food Insecurity:   . Worried About Charity fundraiser in the Last Year: Not on file  . Ran Out of Food in the Last Year: Not on file  Transportation Needs:   . Lack of Transportation (Medical): Not on file  . Lack of Transportation (Non-Medical): Not on file  Physical Activity:   . Days of Exercise per Week: Not on file  . Minutes of Exercise per Session: Not on file  Stress:   . Feeling of Stress : Not on file  Social Connections:   . Frequency of Communication with Friends and Family: Not on file  . Frequency of Social Gatherings with Friends and Family: Not on file  . Attends Religious Services: Not on file  . Active Member of Clubs or Organizations: Not on file  . Attends Archivist Meetings: Not on file  . Marital Status: Not on file   Intimate Partner Violence:   . Fear of Current or Ex-Partner: Not on file  . Emotionally Abused: Not on file  . Physically Abused: Not on file  . Sexually Abused: Not on file   Health Maintenance  Topic Date Due  . MAMMOGRAM  09/19/2020  . TETANUS/TDAP  08/02/2023  . INFLUENZA VACCINE  Completed  . DEXA SCAN  Completed  . COVID-19 Vaccine  Completed  . Hepatitis C Screening  Completed  . PNA vac Low Risk Adult  Completed    The following portions of the patient's history were reviewed and updated as appropriate:  She  has a past medical history of Adenomatous colon polyp, Cancer ([place]) (03/2015), Diverticulosis of colon, GERD (gastroesophageal reflux disease), Hip pain, Hyperlipidemia, Hyperplastic colon polyp, Hypertension, Lactose intolerance, Loose stools, Lower back pain, Osteoporosis, and Palpitations. She does not have any pertinent problems on file. She  has a past surgical history that includes Tubal ligation; Hammer toe surgery; Rotator cuff repair; and Eye surgery (2012). Her family history includes Cancer (age of onset: 50) in her mother; Heart disease in her brother and father; Hyperlipidemia in her mother; Hypertension in her brother, brother, father, and mother; Obesity in her mother; Stroke in her  father; Testicular cancer (age of onset: 92) in her brother. She  reports that she quit smoking about 29 years ago. Her smoking use included cigarettes. She has a 15.00 pack-year smoking history. She has never used smokeless tobacco. She reports current alcohol use of about 5.0 standard drinks of alcohol per week. She reports that she does not use drugs. She has a current medication list which includes the following prescription(s): vitamin d3, diphenhydramine-acetaminophen, hydrochlorothiazide, lisinopril, meclizine, icaps areds 2, and rosuvastatin. Current Outpatient Medications on File Prior to Visit  Medication Sig Dispense Refill  . Cholecalciferol (VITAMIN D3) 125 MCG (5000  UT) CAPS Take 1 capsule (5,000 Units total) by mouth daily. 30 capsule 0  . diphenhydramine-acetaminophen (TYLENOL PM) 25-500 MG TABS tablet Take 1 tablet by mouth at bedtime.    . hydrochlorothiazide (HYDRODIURIL) 25 MG tablet TAKE 1/2 TABLET(12.5 MG) BY MOUTH EVERY MORNING 45 tablet 1  . lisinopril (ZESTRIL) 20 MG tablet Take 1 tablet (20 mg total) by mouth daily. Pt needs OV for further refills 30 tablet 3  . meclizine (ANTIVERT) 25 MG tablet Take 1 tablet (25 mg total) by mouth 3 (three) times daily as needed for dizziness. 10 tablet 0  . Multiple Vitamins-Minerals (ICAPS AREDS 2) CAPS Take 2 capsules by mouth daily.     . rosuvastatin (CRESTOR) 20 MG tablet TAKE 1 TABLET BY MOUTH EVERY DAY 90 tablet 1   No current facility-administered medications on file prior to visit.   She has No Known Allergies..  Review of Systems Review of Systems  Constitutional: Negative for activity change, appetite change and fatigue.  HENT: Negative for hearing loss, congestion, tinnitus and ear discharge.  dentist q43m Eyes: Negative for visual disturbance (see optho q1y -- vision corrected to 20/20 with glasses).  Respiratory: Negative for cough, chest tightness and shortness of breath.   Cardiovascular: Negative for chest pain, palpitations and leg swelling.  Gastrointestinal: Negative for abdominal pain, diarrhea, constipation and abdominal distention.  Genitourinary: Negative for urgency, frequency, decreased urine volume and difficulty urinating.  Musculoskeletal: Negative for back pain, arthralgias and gait problem.  Skin: Negative for color change, pallor and rash.  Neurological: Negative for dizziness, light-headedness, numbness and headaches.  Hematological: Negative for adenopathy. Does not bruise/bleed easily.  Psychiatric/Behavioral: Negative for suicidal ideas, confusion, sleep disturbance, self-injury, dysphoric mood, decreased concentration and agitation.       Objective:    BP 118/60  (BP Location: Right Arm, Patient Position: Sitting, Cuff Size: Large)   Pulse 81   Temp 97.7 F (36.5 C) (Oral)   Resp 18   Ht 4\' 11"  (1.499 m)   Wt 151 lb 12.8 oz (68.9 kg)   SpO2 95%   BMI 30.66 kg/m  General appearance: alert, cooperative, appears stated age and no distress Head: Normocephalic, without obvious abnormality, atraumatic Eyes: negative findings: lids and lashes normal, conjunctivae and sclerae normal and pupils equal, round, reactive to light and accomodation Ears: normal TM's and external ear canals both ears Neck: no adenopathy, no carotid bruit, no JVD, supple, symmetrical, trachea midline and thyroid not enlarged, symmetric, no tenderness/mass/nodules Back: symmetric, no curvature. ROM normal. No CVA tenderness. Lungs: clear to auscultation bilaterally Heart: regular rate and rhythm, S1, S2 normal, no murmur, click, rub or gallop Abdomen: soft, non-tender; bowel sounds normal; no masses,  no organomegaly Pelvic: not indicated; post-menopausal, no abnormal Pap smears in past Extremities: extremities normal, atraumatic, no cyanosis or edema Pulses: 2+ and symmetric Skin: Skin color, texture, turgor normal. No rashes  or lesions Lymph nodes: Cervical, supraclavicular, and axillary nodes normal. Neurologic: Alert and oriented X 3, normal strength and tone. Normal symmetric reflexes. Normal coordination and gait    Assessment:     Healthy female exam.      Plan:    ghm utd Check labs  See After Visit Summary for Counseling Recommendations    1. Hyperlipidemia, unspecified hyperlipidemia type Encouraged heart healthy diet, increase exercise, avoid trans fats, consider a krill oil cap daily con't crestor  - Lipid panel - Comprehensive metabolic panel - CBC with Differential/Platelet  2. Primary hypertension Well controlled, no changes to meds. Encouraged heart healthy diet such as the DASH diet and exercise as tolerated.  - Lipid panel - Comprehensive  metabolic panel - CBC with Differential/Platelet  3. Need for influenza vaccination  - Flu Vaccine QUAD High Dose(Fluad)  4. Estrogen deficiency  - DG Bone Density; Future

## 2020-09-24 NOTE — Patient Instructions (Signed)
Preventive Care 38 Years and Older, Female Preventive care refers to lifestyle choices and visits with your health care provider that can promote health and wellness. This includes:  A yearly physical exam. This is also called an annual well check.  Regular dental and eye exams.  Immunizations.  Screening for certain conditions.  Healthy lifestyle choices, such as diet and exercise. What can I expect for my preventive care visit? Physical exam Your health care provider will check:  Height and weight. These may be used to calculate body mass index (BMI), which is a measurement that tells if you are at a healthy weight.  Heart rate and blood pressure.  Your skin for abnormal spots. Counseling Your health care provider may ask you questions about:  Alcohol, tobacco, and drug use.  Emotional well-being.  Home and relationship well-being.  Sexual activity.  Eating habits.  History of falls.  Memory and ability to understand (cognition).  Work and work Statistician.  Pregnancy and menstrual history. What immunizations do I need?  Influenza (flu) vaccine  This is recommended every year. Tetanus, diphtheria, and pertussis (Tdap) vaccine  You may need a Td booster every 10 years. Varicella (chickenpox) vaccine  You may need this vaccine if you have not already been vaccinated. Zoster (shingles) vaccine  You may need this after age 33. Pneumococcal conjugate (PCV13) vaccine  One dose is recommended after age 33. Pneumococcal polysaccharide (PPSV23) vaccine  One dose is recommended after age 72. Measles, mumps, and rubella (MMR) vaccine  You may need at least one dose of MMR if you were born in 1957 or later. You may also need a second dose. Meningococcal conjugate (MenACWY) vaccine  You may need this if you have certain conditions. Hepatitis A vaccine  You may need this if you have certain conditions or if you travel or work in places where you may be exposed  to hepatitis A. Hepatitis B vaccine  You may need this if you have certain conditions or if you travel or work in places where you may be exposed to hepatitis B. Haemophilus influenzae type b (Hib) vaccine  You may need this if you have certain conditions. You may receive vaccines as individual doses or as more than one vaccine together in one shot (combination vaccines). Talk with your health care provider about the risks and benefits of combination vaccines. What tests do I need? Blood tests  Lipid and cholesterol levels. These may be checked every 5 years, or more frequently depending on your overall health.  Hepatitis C test.  Hepatitis B test. Screening  Lung cancer screening. You may have this screening every year starting at age 39 if you have a 30-pack-year history of smoking and currently smoke or have quit within the past 15 years.  Colorectal cancer screening. All adults should have this screening starting at age 36 and continuing until age 15. Your health care provider may recommend screening at age 23 if you are at increased risk. You will have tests every 1-10 years, depending on your results and the type of screening test.  Diabetes screening. This is done by checking your blood sugar (glucose) after you have not eaten for a while (fasting). You may have this done every 1-3 years.  Mammogram. This may be done every 1-2 years. Talk with your health care provider about how often you should have regular mammograms.  BRCA-related cancer screening. This may be done if you have a family history of breast, ovarian, tubal, or peritoneal cancers.  Other tests  Sexually transmitted disease (STD) testing.  Bone density scan. This is done to screen for osteoporosis. You may have this done starting at age 44. Follow these instructions at home: Eating and drinking  Eat a diet that includes fresh fruits and vegetables, whole grains, lean protein, and low-fat dairy products. Limit  your intake of foods with high amounts of sugar, saturated fats, and salt.  Take vitamin and mineral supplements as recommended by your health care provider.  Do not drink alcohol if your health care provider tells you not to drink.  If you drink alcohol: ? Limit how much you have to 0-1 drink a day. ? Be aware of how much alcohol is in your drink. In the [place] one drink equals one 12 oz bottle of beer (355 mL), one 5 oz glass of wine (148 mL), or one 1 oz glass of hard liquor (44 mL). Lifestyle  Take daily care of your teeth and gums.  Stay active. Exercise for at least 30 minutes on 5 or more days each week.  Do not use any products that contain nicotine or tobacco, such as cigarettes, e-cigarettes, and chewing tobacco. If you need help quitting, ask your health care provider.  If you are sexually active, practice safe sex. Use a condom or other form of protection in order to prevent STIs (sexually transmitted infections).  Talk with your health care provider about taking a low-dose aspirin or statin. What's next?  Go to your health care provider once a year for a well check visit.  Ask your health care provider how often you should have your eyes and teeth checked.  Stay up to date on all vaccines. This information is not intended to replace advice given to you by your health care provider. Make sure you discuss any questions you have with your health care provider. Document Revised: 11/18/2018 Document Reviewed: 11/18/2018 Elsevier Patient Education  2020 [employer].

## 2020-09-25 ENCOUNTER — Ambulatory Visit: Payer: Medicare HMO | Admitting: Physical Therapy

## 2020-09-25 ENCOUNTER — Encounter: Payer: Self-pay | Admitting: Physical Therapy

## 2020-09-25 DIAGNOSIS — M546 Pain in thoracic spine: Secondary | ICD-10-CM

## 2020-09-25 DIAGNOSIS — M542 Cervicalgia: Secondary | ICD-10-CM

## 2020-09-25 DIAGNOSIS — M545 Low back pain, unspecified: Secondary | ICD-10-CM | POA: Diagnosis not present

## 2020-09-25 LAB — CBC WITH DIFFERENTIAL/PLATELET
Absolute Monocytes: 767 cells/uL (ref 200–950)
Basophils Absolute: 71 cells/uL (ref 0–200)
Basophils Relative: 1 %
Eosinophils Absolute: 547 cells/uL — ABNORMAL HIGH (ref 15–500)
Eosinophils Relative: 7.7 %
HCT: 44.1 % (ref 35.0–45.0)
Hemoglobin: 14.5 g/dL (ref 11.7–15.5)
Lymphs Abs: 1960 cells/uL (ref 850–3900)
MCH: 29.4 pg (ref 27.0–33.0)
MCHC: 32.9 g/dL (ref 32.0–36.0)
MCV: 89.3 fL (ref 80.0–100.0)
MPV: 11.6 fL (ref 7.5–12.5)
Monocytes Relative: 10.8 %
Neutro Abs: 3756 cells/uL (ref 1500–7800)
Neutrophils Relative %: 52.9 %
Platelets: 275 10*3/uL (ref 140–400)
RBC: 4.94 10*6/uL (ref 3.80–5.10)
RDW: 13.2 % (ref 11.0–15.0)
Total Lymphocyte: 27.6 %
WBC: 7.1 10*3/uL (ref 3.8–10.8)

## 2020-09-25 LAB — LIPID PANEL
Cholesterol: 155 mg/dL (ref <200)
HDL: 48 mg/dL — ABNORMAL LOW (ref >=50)
LDL Cholesterol (Calc): 83 mg/dL (calc)
Non-HDL Cholesterol (Calc): 107 mg/dL (calc) (ref <130)
Total CHOL/HDL Ratio: 3.2 (calc) (ref <5.0)
Triglycerides: 142 mg/dL (ref <150)

## 2020-09-25 LAB — COMPREHENSIVE METABOLIC PANEL
AG Ratio: 1.3 (calc) (ref 1.0–2.5)
ALT: 24 U/L (ref 6–29)
AST: 22 U/L (ref 10–35)
Albumin: 4 g/dL (ref 3.6–5.1)
Alkaline phosphatase (APISO): 94 U/L (ref 37–153)
BUN: 19 mg/dL (ref 7–25)
CO2: 31 mmol/L (ref 20–32)
Calcium: 9.9 mg/dL (ref 8.6–10.4)
Chloride: 100 mmol/L (ref 98–110)
Creat: 0.75 mg/dL (ref 0.60–0.93)
Globulin: 3.1 g/dL (calc) (ref 1.9–3.7)
Glucose, Bld: 72 mg/dL (ref 65–99)
Potassium: 4.6 mmol/L (ref 3.5–5.3)
Sodium: 139 mmol/L (ref 135–146)
Total Bilirubin: 0.7 mg/dL (ref 0.2–1.2)
Total Protein: 7.1 g/dL (ref 6.1–8.1)

## 2020-09-25 NOTE — Therapy (Signed)
[facility] [place]. [place], [place], [address] Phone: [phone]   Fax:  [phone]  Physical Therapy Treatment  Patient Details  Name: Cindy Velasquez MRN: [id number] Date of Birth: Jan 15, 1941 Referring Provider (PT): Etter Sjogren   Encounter Date: 09/25/2020   PT End of Session - 09/25/20 1136    Visit Number 8    Number of Visits 12    Date for PT Re-Evaluation 10/21/20    Authorization Type [employer]    PT Start Time 0601    PT Stop Time 1145    PT Time Calculation (min) 53 min    Activity Tolerance Patient tolerated treatment well    Behavior During Therapy [facility] for tasks assessed/performed           Past Medical History:  Diagnosis Date  . Adenomatous colon polyp   . Cancer ([place]) 03/2015   BCC R tibia   . Diverticulosis of colon   . GERD (gastroesophageal reflux disease)   . Hip pain   . Hyperlipidemia   . Hyperplastic colon polyp   . Hypertension   . Lactose intolerance   . Loose stools   . Lower back pain   . Osteoporosis   . Palpitations     Past Surgical History:  Procedure Laterality Date  . EYE SURGERY  2012   B/L 01/2011  02/2011  . HAMMER TOE SURGERY     Bilateral  . ROTATOR CUFF REPAIR     Left  . TUBAL LIGATION      There were no vitals filed for this visit.   Subjective Assessment - 09/25/20 1058    Subjective Patient reports that she went to [facility] and visited with children and grandchildren, was able to lift the baby and carry without much issue, reports that she feels she slept wrong last night and now with a little more tightnes and pain.    Currently in Pain? Yes    Pain Score 4     Pain Location Neck    Aggravating Factors  slept wrong last night                             [facility] Adult PT Treatment/Exercise - 09/25/20 0001      Knee/Hip Exercises: Stretches   Passive Hamstring Stretch Both;4 reps;20 seconds    Piriformis Stretch Both;4 reps;20 seconds     Other Knee/Hip Stretches corner and doorway stretches      Knee/Hip Exercises: Aerobic   Other Aerobic UBE L2 x 2 min each       Knee/Hip Exercises: Machines for Strengthening   Other Machine lats 20#, seated row 20# 2x10, Shoulder Ext 5lb 2x10       Knee/Hip Exercises: Standing   Other Standing Knee Exercises W backs with 1#, overhead weighted ball lifts, 4# shrugs with upper trap and levator stretches    Other Standing Knee Exercises red tband shoulder ER 2x10      Knee/Hip Exercises: Supine   Other Supine Knee/Hip Exercises fet on ball K2C, rotaiton, small bridges and isometric abs 2x10      Modalities   Modalities Electrical Stimulation;Moist Heat      Moist Heat Therapy   Number Minutes Moist Heat 10 Minutes    Moist Heat Location Lumbar Spine;Cervical      Electrical Stimulation   Electrical Stimulation Location upper back/cervical    Electrical Stimulation Action IFC  Electrical Stimulation Parameters supine    Electrical Stimulation Goals Pain      Manual Therapy   Manual Therapy Soft tissue mobilization    Soft tissue mobilization bilateral upper trap and cervical area                    PT Short Term Goals - 08/30/20 1223      PT SHORT TERM GOAL #1   Title independent with initial HEP    Status Achieved             PT Long Term Goals - 09/25/20 1143      PT LONG TERM GOAL #1   Title increase cervical ROM 25% for easier backing up of the car    Status Partially Met      PT LONG TERM GOAL #2   Title increase lumbar ROM 25%    Status Achieved      PT LONG TERM GOAL #3   Title decrease pain 50%    Status Partially Met                 Plan - 09/25/20 1137    Clinical Impression Statement Patient reports taht she had a great visit with her grand daughter and able to lift and carry her wihtout difficulty, does have some significant spasms in the upper traps and the rhomboid area, she is tight in the pectoral area and I added her  stretches for this at home.    PT Next Visit Plan continue to work on her posture and flexibility    Consulted and Agree with Plan of Care Patient           Patient will benefit from skilled therapeutic intervention in order to improve the following deficits and impairments:  Decreased range of motion, Decreased activity tolerance, Pain, Impaired flexibility, Decreased strength, Decreased mobility, Decreased balance, Postural dysfunction, Improper body mechanics, Increased muscle spasms  Visit Diagnosis: Acute bilateral low back pain without sciatica  Pain in thoracic spine  Cervicalgia     Problem List Patient Active Problem List   Diagnosis Date Noted  . Thoracic stenosis 07/27/2020  . Dyslipidemia 07/27/2020  . Prediabetes 12/13/2018  . Vitamin D deficiency 12/13/2018  . Overweight (BMI 25.0-29.9) 12/13/2018  . Preventative health care 09/15/2018  . Bilateral cold feet 06/17/2018  . Idiopathic peripheral neuropathy 06/17/2018  . Lower extremity edema 06/17/2018  . Right shoulder pain 08/20/2016  . Knee pain, bilateral 04/23/2015  . Urticaria 10/13/2014  . Neuropathy 10/13/2014  . Fatigue 10/13/2014  . Abrasion of face 08/01/2013  . Rib pain on left side 07/25/2013  . Tinea cruris 06/22/2012  . Eustachian tube dysfunction 04/07/2012  . Cerumen impaction 04/07/2012  . URI 10/30/2010  . CHANGE IN BOWELS 04/03/2010  . FECAL OCCULT BLOOD 04/03/2010  . PERSONAL HX COLONIC POLYPS 04/03/2010  . DYSPEPSIA 03/04/2010  . GUAIAC POSITIVE STOOL 03/04/2010  . HEMATURIA UNSPECIFIED 06/28/2009  . DYSURIA 06/28/2009  . HAMMER TOE 12/28/2008  . POSTMENOPAUSAL STATUS 12/28/2008  . BACK PAIN, LUMBAR 07/11/2008  . BENIGN NEOPLASM OF SKIN SITE UNSPECIFIED 02/28/2008  . Hyperlipidemia 04/15/2007  . Essential hypertension 04/15/2007  . DIVERTICULOSIS, COLON 04/15/2007  . THUMB PAIN, RIGHT 04/15/2007  . OSTEOPOROSIS 04/15/2007    Sumner Boast., PT 09/25/2020, 11:44  AM  [place]. [place], [place], [address] Phone: [phone]   Fax:  [phone]  Name: ELNA RADOVICH MRN: [id number] Date of  Birth: 1941-11-06

## 2020-09-26 ENCOUNTER — Other Ambulatory Visit (HOSPITAL_BASED_OUTPATIENT_CLINIC_OR_DEPARTMENT_OTHER): Payer: Self-pay | Admitting: Obstetrics and Gynecology

## 2020-09-27 ENCOUNTER — Other Ambulatory Visit: Payer: Self-pay

## 2020-09-27 ENCOUNTER — Ambulatory Visit: Payer: Medicare HMO | Admitting: Physical Therapy

## 2020-09-27 ENCOUNTER — Encounter: Payer: Self-pay | Admitting: Physical Therapy

## 2020-09-27 DIAGNOSIS — M545 Low back pain, unspecified: Secondary | ICD-10-CM | POA: Diagnosis not present

## 2020-09-27 DIAGNOSIS — M546 Pain in thoracic spine: Secondary | ICD-10-CM | POA: Diagnosis not present

## 2020-09-27 DIAGNOSIS — M542 Cervicalgia: Secondary | ICD-10-CM | POA: Diagnosis not present

## 2020-09-27 NOTE — Therapy (Signed)
[place]. [place], [place], [address] Phone: [phone]   Fax:  [phone]  Physical Therapy Treatment  Patient Details  Name: Cindy Velasquez MRN: [id number] Date of Birth: [birth year] Referring Provider (PT): Etter Sjogren   Encounter Date: 09/27/2020   PT End of Session - 09/27/20 1134    Visit Number 9    Number of Visits 12    Date for PT Re-Evaluation 10/21/20    Authorization Type [employer]    PT Start Time 1050    PT Stop Time 1146    PT Time Calculation (min) 56 min    Activity Tolerance Patient tolerated treatment well    Behavior During Therapy [facility] for tasks assessed/performed           Past Medical History:  Diagnosis Date  . Adenomatous colon polyp   . Cancer ([place]) 03/2015   BCC R tibia   . Diverticulosis of colon   . GERD (gastroesophageal reflux disease)   . Hip pain   . Hyperlipidemia   . Hyperplastic colon polyp   . Hypertension   . Lactose intolerance   . Loose stools   . Lower back pain   . Osteoporosis   . Palpitations     Past Surgical History:  Procedure Laterality Date  . EYE SURGERY  2012   B/L 01/2011  02/2011  . HAMMER TOE SURGERY     Bilateral  . ROTATOR CUFF REPAIR     Left  . TUBAL LIGATION      There were no vitals filed for this visit.   Subjective Assessment - 09/27/20 1046    Subjective Patient reports that she is feeling better    Currently in Pain? Yes    Pain Score 3     Pain Location Neck    Pain Orientation Right    Pain Descriptors / Indicators Spasm;Tightness    Pain Relieving Factors the last treatment really helped                             [facility] Adult PT Treatment/Exercise - 09/27/20 0001      Ambulation/Gait   Gait Comments walking around the back building with 2# for biceps and over head press alternating about 3 rounds of this during the lap      Knee/Hip Exercises: Stretches   Other Knee/Hip Stretches corner and doorway  stretches      Knee/Hip Exercises: Aerobic   Nustep L5 x6 min       Knee/Hip Exercises: Machines for Strengthening   Other Machine lats 20#, seated row 20# 2x10, Shoulder Ext 5lb 2x10 , triceps 20# 2x10      Knee/Hip Exercises: Standing   Other Standing Knee Exercises W backs with 1#, overhead weighted ball lifts, 4# shrugs with upper trap and levator stretches    Other Standing Knee Exercises red tband shoulder ER 2x10      Moist Heat Therapy   Number Minutes Moist Heat 10 Minutes    Moist Heat Location Lumbar Spine;Cervical      Electrical Stimulation   Electrical Stimulation Location right upper trap and rhomboid area    Electrical Stimulation Action IFC    Electrical Stimulation Parameters supine    Electrical Stimulation Goals Pain      Manual Therapy   Manual Therapy Soft tissue mobilization    Soft tissue mobilization right upper trap, neck and rhomboid area  PT Short Term Goals - 08/30/20 1223      PT SHORT TERM GOAL #1   Title independent with initial HEP    Status Achieved             PT Long Term Goals - 09/27/20 1141      PT LONG TERM GOAL #1   Title increase cervical ROM 25% for easier backing up of the car    Status Partially Met      PT LONG TERM GOAL #2   Title increase lumbar ROM 25%    Status Achieved      PT LONG TERM GOAL #3   Title decrease pain 50%    Status Partially Met                 Plan - 09/27/20 1138    Clinical Impression Statement Patient reports that she is doing well, I upped some activities, she is interested in what to do at the gym, she has had some difficulty remembering some of the HEP.  Still with some significant trigger points in the right upper trap.    PT Next Visit Plan may work on giving her information about the exercise for the gym    Consulted and Agree with Plan of Care Patient           Patient will benefit from skilled therapeutic intervention in order to improve the  following deficits and impairments:  Decreased range of motion, Decreased activity tolerance, Pain, Impaired flexibility, Decreased strength, Decreased mobility, Decreased balance, Postural dysfunction, Improper body mechanics, Increased muscle spasms  Visit Diagnosis: Acute bilateral low back pain without sciatica  Pain in thoracic spine  Cervicalgia     Problem List Patient Active Problem List   Diagnosis Date Noted  . Thoracic stenosis 07/27/2020  . Dyslipidemia 07/27/2020  . Prediabetes 12/13/2018  . Vitamin D deficiency 12/13/2018  . Overweight (BMI 25.0-29.9) 12/13/2018  . Preventative health care 09/15/2018  . Bilateral cold feet 06/17/2018  . Idiopathic peripheral neuropathy 06/17/2018  . Lower extremity edema 06/17/2018  . Right shoulder pain 08/20/2016  . Knee pain, bilateral 04/23/2015  . Urticaria 10/13/2014  . Neuropathy 10/13/2014  . Fatigue 10/13/2014  . Abrasion of face 08/01/2013  . Rib pain on left side 07/25/2013  . Tinea cruris 06/22/2012  . Eustachian tube dysfunction 04/07/2012  . Cerumen impaction 04/07/2012  . URI 10/30/2010  . CHANGE IN BOWELS 04/03/2010  . FECAL OCCULT BLOOD 04/03/2010  . PERSONAL HX COLONIC POLYPS 04/03/2010  . DYSPEPSIA 03/04/2010  . GUAIAC POSITIVE STOOL 03/04/2010  . HEMATURIA UNSPECIFIED 06/28/2009  . DYSURIA 06/28/2009  . HAMMER TOE 12/28/2008  . POSTMENOPAUSAL STATUS 12/28/2008  . BACK PAIN, LUMBAR 07/11/2008  . BENIGN NEOPLASM OF SKIN SITE UNSPECIFIED 02/28/2008  . Hyperlipidemia 04/15/2007  . Essential hypertension 04/15/2007  . DIVERTICULOSIS, COLON 04/15/2007  . THUMB PAIN, RIGHT 04/15/2007  . OSTEOPOROSIS 04/15/2007    Sumner Boast., PT 09/27/2020, 11:42 AM  [place]. [place], [place], [address] Phone: [phone]   Fax:  [phone]  Name: Cindy Velasquez MRN: [id number] Date of Birth: 03-29-1941

## 2020-09-28 ENCOUNTER — Other Ambulatory Visit (HOSPITAL_BASED_OUTPATIENT_CLINIC_OR_DEPARTMENT_OTHER): Payer: Self-pay | Admitting: Internal Medicine

## 2020-09-28 ENCOUNTER — Ambulatory Visit: Payer: Medicare HMO | Attending: Internal Medicine

## 2020-09-28 DIAGNOSIS — Z23 Encounter for immunization: Secondary | ICD-10-CM

## 2020-10-02 ENCOUNTER — Other Ambulatory Visit: Payer: Self-pay

## 2020-10-02 ENCOUNTER — Ambulatory Visit: Payer: Medicare HMO | Admitting: Physical Therapy

## 2020-10-02 DIAGNOSIS — M542 Cervicalgia: Secondary | ICD-10-CM

## 2020-10-02 DIAGNOSIS — M546 Pain in thoracic spine: Secondary | ICD-10-CM | POA: Diagnosis not present

## 2020-10-02 DIAGNOSIS — M545 Low back pain, unspecified: Secondary | ICD-10-CM | POA: Diagnosis not present

## 2020-10-02 NOTE — Therapy (Signed)
[place]. [place], [place], [address] Phone: [phone]   Fax:  [phone] Progress Note Reporting Period 08/24/20 to 10/02/20 for the first 10 visits  See note below for Objective Data and Assessment of Progress/Goals.      Physical Therapy Treatment  Patient Details  Name: Cindy Velasquez MRN: [id number] Date of Birth: [birth year] Referring Provider (PT): Etter Sjogren   Encounter Date: 10/02/2020   PT End of Session - 10/02/20 1206    Visit Number 10    Date for PT Re-Evaluation 10/21/20    PT Start Time 1137    PT Stop Time 1230    PT Time Calculation (min) 53 min           Past Medical History:  Diagnosis Date  . Adenomatous colon polyp   . Cancer ([place]) 03/2015   BCC R tibia   . Diverticulosis of colon   . GERD (gastroesophageal reflux disease)   . Hip pain   . Hyperlipidemia   . Hyperplastic colon polyp   . Hypertension   . Lactose intolerance   . Loose stools   . Lower back pain   . Osteoporosis   . Palpitations     Past Surgical History:  Procedure Laterality Date  . EYE SURGERY  2012   B/L 01/2011  02/2011  . HAMMER TOE SURGERY     Bilateral  . ROTATOR CUFF REPAIR     Left  . TUBAL LIGATION      There were no vitals filed for this visit.   Subjective Assessment - 10/02/20 1138    Subjective pretty good shape right now- just normal pain across neck but much less    Currently in Pain? Yes    Pain Score 2     Pain Location Neck                             [facility] Adult PT Treatment/Exercise - 10/02/20 0001      Knee/Hip Exercises: Aerobic   Nustep L5 x6 min     Other Aerobic UBE L2 x 2 min each       Knee/Hip Exercises: Machines for Strengthening   Other Machine lats 20#, seated row 20# 2x10, Shoulder Ext 5lb 2x10 , triceps 20# 2x10      Knee/Hip Exercises: Standing   Walking with Sports Cord 10# each side fwd 5 x 3 x each side    Other Standing Knee Exercises  STS with wt ball press on airex 2 sets 10   ball vs wall 5x CC and CCW   Other Standing Knee Exercises red tband shoulder ER 2x10   5# shruggs and backward rolls 10 each     Moist Heat Therapy   Number Minutes Moist Heat 15 Minutes    Moist Heat Location Lumbar Spine;Cervical      Electrical Stimulation   Electrical Stimulation Location right upper trap and rhomboid area    Electrical Stimulation Action IFC    Electrical Stimulation Parameters supine    Electrical Stimulation Goals Pain                    PT Short Term Goals - 08/30/20 1223      PT SHORT TERM GOAL #1   Title independent with initial HEP    Status Achieved             PT Long Term  Goals - 10/02/20 1207      PT LONG TERM GOAL #1   Title increase cervical ROM 25% for easier backing up of the car    Status Partially Met      PT LONG TERM GOAL #2   Title increase lumbar ROM 25%    Status Achieved      PT LONG TERM GOAL #3   Title decrease pain 50%    Status Achieved      PT LONG TERM GOAL #4   Title report no difficulty sleeping    Status Partially Met                 Plan - 10/02/20 1208    Clinical Impression Statement pt tolerated increased ther ex well today and PT Acontinued educ for retrun to gym actvities- pt [facility]ing [facility] [employer] [facility] [facility]. progressing with goals    PT Treatment/Interventions ADLs/Self Care Home Management;Electrical Stimulation;Therapeutic activities;Therapeutic exercise;Neuromuscular re-education;Manual techniques;Patient/family education;Moist Heat;Traction;Dry needling    PT Next Visit Plan 1 visit next week then plan to D/C           Patient will benefit from skilled therapeutic intervention in order to improve the following deficits and impairments:  Decreased range of motion, Decreased activity tolerance, Pain, Impaired flexibility, Decreased strength, Decreased mobility, Decreased balance, Postural dysfunction, Improper body  mechanics, Increased muscle spasms  Visit Diagnosis: Cervicalgia     Problem List Patient Active Problem List   Diagnosis Date Noted  . Thoracic stenosis 07/27/2020  . Dyslipidemia 07/27/2020  . Prediabetes 12/13/2018  . Vitamin D deficiency 12/13/2018  . Overweight (BMI 25.0-29.9) 12/13/2018  . Preventative health care 09/15/2018  . Bilateral cold feet 06/17/2018  . Idiopathic peripheral neuropathy 06/17/2018  . Lower extremity edema 06/17/2018  . Right shoulder pain 08/20/2016  . Knee pain, bilateral 04/23/2015  . Urticaria 10/13/2014  . Neuropathy 10/13/2014  . Fatigue 10/13/2014  . Abrasion of face 08/01/2013  . Rib pain on left side 07/25/2013  . Tinea cruris 06/22/2012  . Eustachian tube dysfunction 04/07/2012  . Cerumen impaction 04/07/2012  . URI 10/30/2010  . CHANGE IN BOWELS 04/03/2010  . FECAL OCCULT BLOOD 04/03/2010  . PERSONAL HX COLONIC POLYPS 04/03/2010  . DYSPEPSIA 03/04/2010  . GUAIAC POSITIVE STOOL 03/04/2010  . HEMATURIA UNSPECIFIED 06/28/2009  . DYSURIA 06/28/2009  . HAMMER TOE 12/28/2008  . POSTMENOPAUSAL STATUS 12/28/2008  . BACK PAIN, LUMBAR 07/11/2008  . BENIGN NEOPLASM OF SKIN SITE UNSPECIFIED 02/28/2008  . Hyperlipidemia 04/15/2007  . Essential hypertension 04/15/2007  . DIVERTICULOSIS, COLON 04/15/2007  . THUMB PAIN, RIGHT 04/15/2007  . OSTEOPOROSIS 04/15/2007    Korbin Mapps,ANGIE  PTA 10/02/2020, 12:16 PM  [place]. [place], [place], [address] Phone: [phone]   Fax:  [phone]  Name: JEANETT ANTONOPOULOS MRN: [id number] Date of Birth: 23-Jun-1941

## 2020-10-04 ENCOUNTER — Encounter: Payer: Self-pay | Admitting: Family Medicine

## 2020-10-04 ENCOUNTER — Ambulatory Visit (INDEPENDENT_AMBULATORY_CARE_PROVIDER_SITE_OTHER): Payer: Medicare HMO | Admitting: Family Medicine

## 2020-10-04 ENCOUNTER — Other Ambulatory Visit: Payer: Self-pay

## 2020-10-04 VITALS — BP 136/68 | HR 79 | Temp 98.0°F | Resp 18 | Ht 59.0 in | Wt 158.2 lb

## 2020-10-04 DIAGNOSIS — R9389 Abnormal findings on diagnostic imaging of other specified body structures: Secondary | ICD-10-CM | POA: Diagnosis not present

## 2020-10-04 MED FILL — PFIZER-BIONTECH COVID-19 VA: 30 | 1 days supply | Qty: 0 | Fill #0

## 2020-10-04 NOTE — Patient Instructions (Addendum)
Carotid Artery Disease  Carotid artery disease is the narrowing or blockage of one or both carotid arteries. This condition is also called carotid artery stenosis. The carotid arteries are the two main blood vessels on either side of the neck. They send blood to the brain, other parts of the head, and the neck.  This condition increases your risk for a stroke or a transient ischemic attack (TIA). A TIA is a "mini-stroke" that causes stroke-like symptoms that go away quickly. What are the causes? This condition is mainly caused by a narrowing and hardening of the carotid arteries. The carotid arteries can become narrow or clogged with a buildup of plaque. Plaque includes:  Fat.  Cholesterol.  Calcium.  Other substances. What increases the risk? The following factors may make you more likely to develop this condition:  Having certain medical conditions, such as: ? High cholesterol. ? High blood pressure. ? Diabetes. ? Obesity.  Smoking.  A family history of cardiovascular disease.  Not being active or lack of regular exercise.  Being female. Men have a higher risk of having arteries become narrow and harden earlier in life than women.  Old age. What are the signs or symptoms? This condition may not have any signs or symptoms until a stroke or TIA happens. In some cases, your doctor may be able to hear a whooshing sound. This can suggest a change in blood flow caused by plaque buildup. An eye exam can also help find signs of the condition. How is this treated? This condition may be treated with more than one treatment. Treatment options include:  Lifestyle changes, such as: ? Quitting smoking. ? Getting regular exercise, or getting exercise as told by your doctor. ? Eating a healthy diet. ? Managing stress. ? Keeping a healthy weight.  Medicines to control: ? Blood pressure. ? Cholesterol. ? Blood clotting.  Surgery. You may have: ? A surgery to remove the blockages in  the carotid arteries. ? A procedure in which a small mesh tube (stent) is used to widen the blocked carotid arteries. Follow these instructions at home: Eating and drinking Follow instructions about your diet from your doctor. It is important to follow a healthy diet.  Eat a diet that includes: ? A lot of fresh fruits and vegetables. ? Low-fat (lean) meats.  Avoid these foods: ? Foods that are high in fat. ? Foods that are high in salt (sodium). ? Foods that are fried. ? Foods that are processed. ? Foods that have few good nutrients (poor nutritional value).  Lifestyle   Keep a healthy weight.  Do exercises as told by your doctor to stay active. Each week, you should get one of the following: ? At least 150 minutes of exercise that raises your heart rate and makes you sweat (moderate-intensity exercise). ? At least 75 minutes of exercise that takes a lot of effort.  Do not use any products that contain nicotine or tobacco, such as cigarettes, e-cigarettes, and chewing tobacco. If you need help quitting, ask your doctor.  Do not drink alcohol if: ? Your doctor tells you not to drink. ? You are pregnant, may be pregnant, or are planning to become pregnant.  If you drink alcohol: ? Limit how much you use to:  0-1 drink a day for women.  0-2 drinks a day for men. ? Be aware of how much alcohol is in your drink. In the [place] one drink equals one 12 oz bottle of beer (355 mL), one 5   oz glass of wine (148 mL), or one 1 oz glass of hard liquor (44 mL).  Do not use drugs.  Manage your stress. Ask your doctor for tips on how to do this. General instructions  Take over-the-counter and prescription medicines only as told by your doctor.  Keep all follow-up visits as told by your doctor. This is important. Where to find more information  American Heart Association: www.heart.org Get help right away if:  You have any signs of a stroke. "BE FAST" is an easy way to remember the  main warning signs: ? B - Balance. Signs are dizziness, sudden trouble walking, or loss of balance. ? E - Eyes. Signs are trouble seeing or a change in how you see. ? F - Face. Signs are sudden weakness or loss of feeling of the face, or the face or eyelid drooping on one side. ? A - Arms. Signs are weakness or loss of feeling in an arm. This happens suddenly and usually on one side of the body. ? S - Speech. Signs are sudden trouble speaking, slurred speech, or trouble understanding what people say. ? T - Time. Time to call emergency services. Write down what time symptoms started.  You have other signs of a stroke, such as: ? A sudden, very bad headache with no known cause. ? Feeling like you may vomit (nausea). ? Vomiting. ? A seizure. These symptoms may be an emergency. Do not wait to see if the symptoms will go away. Get medical help right away. Call your local emergency services (911 in the [place] Do not drive yourself to the hospital. Summary  The carotid arteries are blood vessels on both sides of the neck.  If these arteries get smaller or get blocked, you are more likely to have a stroke or a mini-stroke.  This condition can be treated with lifestyle changes, medicines, surgery, or a blend of these treatments.  Get help right away if you have any signs of a stroke. "BE FAST" is an easy way to remember the main warning signs of stroke. This information is not intended to replace advice given to you by your health care provider. Make sure you discuss any questions you have with your health care provider. Document Revised: 06/20/2019 Document Reviewed: 06/20/2019 Elsevier Patient Education  2020 [employer]  

## 2020-10-04 NOTE — Progress Notes (Signed)
Patient ID: Cindy Velasquez, female    DOB: Jan 19, 1941  Age: 79 y.o. MRN: [id number]    Subjective:  Subjective  HPI Cindy Velasquez presents for f/u from dentist --- he thought he saw a blockage on xray in carotid on the right and told her to f/u here   Review of Systems  Constitutional: Negative for appetite change, diaphoresis, fatigue and unexpected weight change.  Eyes: Negative for pain, redness and visual disturbance.  Respiratory: Negative for cough, chest tightness, shortness of breath and wheezing.   Cardiovascular: Negative for chest pain, palpitations and leg swelling.  Endocrine: Negative for cold intolerance, heat intolerance, polydipsia, polyphagia and polyuria.  Genitourinary: Negative for difficulty urinating, dysuria and frequency.  Neurological: Negative for dizziness, light-headedness, numbness and headaches.    History Past Medical History:  Diagnosis Date  . Adenomatous colon polyp   . Cancer ([place]) 03/2015   BCC R tibia   . Diverticulosis of colon   . GERD (gastroesophageal reflux disease)   . Hip pain   . Hyperlipidemia   . Hyperplastic colon polyp   . Hypertension   . Lactose intolerance   . Loose stools   . Lower back pain   . Osteoporosis   . Palpitations     She has a past surgical history that includes Tubal ligation; Hammer toe surgery; Rotator cuff repair; and Eye surgery (2012).   Her family history includes Cancer (age of onset: 64) in her mother; Heart disease in her brother and father; Hyperlipidemia in her mother; Hypertension in her brother, brother, father, and mother; Obesity in her mother; Stroke in her father; Testicular cancer (age of onset: 73) in her brother.She reports that she quit smoking about 29 years ago. Her smoking use included cigarettes. She has a 15.00 pack-year smoking history. She has never used smokeless tobacco. She reports current alcohol use of about 5.0 standard drinks of alcohol per week. She reports that she does not use  drugs.  Current Outpatient Medications on File Prior to Visit  Medication Sig Dispense Refill  . Cholecalciferol (VITAMIN D3) 125 MCG (5000 UT) CAPS Take 1 capsule (5,000 Units total) by mouth daily. 30 capsule 0  . hydrochlorothiazide (HYDRODIURIL) 25 MG tablet TAKE 1/2 TABLET(12.5 MG) BY MOUTH EVERY MORNING 45 tablet 1  . lisinopril (ZESTRIL) 20 MG tablet Take 1 tablet (20 mg total) by mouth daily. Pt needs OV for further refills 30 tablet 3  . MELATONIN ER PO Take by mouth.    . Multiple Vitamins-Minerals (ICAPS AREDS 2) CAPS Take 2 capsules by mouth daily.     . rosuvastatin (CRESTOR) 20 MG tablet TAKE 1 TABLET BY MOUTH EVERY DAY 90 tablet 1  . diphenhydramine-acetaminophen (TYLENOL PM) 25-500 MG TABS tablet Take 1 tablet by mouth at bedtime. (Patient not taking: Reported on 10/04/2020)    . meclizine (ANTIVERT) 25 MG tablet Take 1 tablet (25 mg total) by mouth 3 (three) times daily as needed for dizziness. (Patient not taking: Reported on 10/04/2020) 10 tablet 0   No current facility-administered medications on file prior to visit.     Objective:  Objective  Physical Exam Vitals and nursing note reviewed.  Constitutional:      Appearance: She is well-developed.  HENT:     Head: Normocephalic and atraumatic.  Eyes:     Conjunctiva/sclera: Conjunctivae normal.  Neck:     Thyroid: No thyromegaly.     Vascular: No carotid bruit or JVD.  Cardiovascular:     Rate and  Rhythm: Normal rate and regular rhythm.     Heart sounds: Normal heart sounds. No murmur heard.   Pulmonary:     Effort: Pulmonary effort is normal. No respiratory distress.     Breath sounds: Normal breath sounds. No wheezing or rales.  Chest:     Chest wall: No tenderness.  Musculoskeletal:     Cervical back: Normal range of motion and neck supple.  Neurological:     Mental Status: She is alert and oriented to person, place, and time.    BP 136/68 (BP Location: Right Arm, Patient Position: Sitting, Cuff Size:  Normal)   Pulse 79   Temp 98 F (36.7 C) (Oral)   Resp 18   Ht 4\' 11"  (1.499 m)   Wt 158 lb 3.2 oz (71.8 kg)   SpO2 98%   BMI 31.95 kg/m  Wt Readings from Last 3 Encounters:  10/04/20 158 lb 3.2 oz (71.8 kg)  09/24/20 151 lb 12.8 oz (68.9 kg)  07/29/20 153 lb 6.4 oz (69.6 kg)     Lab Results  Component Value Date   WBC 7.1 09/24/2020   HGB 14.5 09/24/2020   HCT 44.1 09/24/2020   PLT 275 09/24/2020   GLUCOSE 72 09/24/2020   CHOL 155 09/24/2020   TRIG 142 09/24/2020   HDL 48 (L) 09/24/2020   LDLDIRECT 129.1 08/23/2007   LDLCALC 83 09/24/2020   ALT 24 09/24/2020   AST 22 09/24/2020   NA 139 09/24/2020   K 4.6 09/24/2020   CL 100 09/24/2020   CREATININE 0.75 09/24/2020   BUN 19 09/24/2020   CO2 31 09/24/2020   TSH 1.29 09/15/2019   INR 1.0 07/29/2020   HGBA1C 5.9 (H) 08/02/2020    CT Angio Head W or Wo Contrast  Result Date: 07/29/2020 CLINICAL DATA:  Lightheaded and dizzy since yesterday. EXAM: CT ANGIOGRAPHY HEAD AND NECK TECHNIQUE: Multidetector CT imaging of the head and neck was performed using the standard protocol during bolus administration of intravenous contrast. Multiplanar CT image reconstructions and MIPs were obtained to evaluate the vascular anatomy. Carotid stenosis measurements (when applicable) are obtained utilizing NASCET criteria, using the distal internal carotid diameter as the denominator. CONTRAST:  143mL OMNIPAQUE IOHEXOL 350 MG/ML SOLN COMPARISON:  None. FINDINGS: CT HEAD FINDINGS Brain: Mild age related volume loss. Mild chronic appearing small vessel ischemic change of the cerebral hemispheric white matter. No sign of acute infarction, mass lesion, hemorrhage, hydrocephalus or extra-axial collection. Vascular: Minimal atherosclerotic calcification of the vessels at the base of the brain. Skull: Negative Sinuses: Clear Orbits: Normal Review of the MIP images confirms the above findings CTA NECK FINDINGS Aortic arch: Aortic atherosclerotic  calcification. Branching pattern is normal without origin stenosis. Right carotid system: Common carotid artery widely patent to the bifurcation. Calcified plaque at the carotid bifurcation but no stenosis. Cervical ICA widely patent. Left carotid system: Common carotid artery widely patent to the bifurcation. Carotid bifurcation is normal without soft or calcified plaque. No stenosis. Cervical ICA widely patent. Vertebral arteries: No flow limiting subclavian disease. Both vertebral artery origins are widely patent. The right vertebral artery is dominant. Both vertebral arteries are patent through the cervical region to the foramen magnum. Skeleton: Ordinary cervical spondylosis. Some degree of spinal stenosis at C5-6. Chronic fusion at C4-5. Other neck: No mass or lymphadenopathy. Upper chest: Normal Review of the MIP images confirms the above findings CTA HEAD FINDINGS Anterior circulation: Both internal carotid arteries are patent through the skull base and siphon regions. No  siphon stenosis. The anterior and middle cerebral vessels are patent without proximal stenosis, aneurysm or vascular malformation. No large or medium vessel occlusion. Fetal origin of the right PCA. Posterior circulation: Both vertebral arteries are widely patent through the foramen magnum to the basilar. No basilar stenosis. Posterior circulation branch vessels are normal. Venous sinuses: Patent and normal. Anatomic variants: None significant. Review of the MIP images confirms the above findings IMPRESSION: No acute head CT finding. Chronic small-vessel ischemic changes of the cerebral hemispheric white matter. No acute large or medium vessel occlusion. Aortic atherosclerosis. Minimal carotid bifurcation atherosclerosis on the right. No stenosis on either side. No vertebral artery or other posterior circulation pathology. Electronically Signed   By: Nelson Chimes M.D.   On: 07/29/2020 16:35   CT Angio Neck W and/or Wo Contrast  Result  Date: 07/29/2020 CLINICAL DATA:  Lightheaded and dizzy since yesterday. EXAM: CT ANGIOGRAPHY HEAD AND NECK TECHNIQUE: Multidetector CT imaging of the head and neck was performed using the standard protocol during bolus administration of intravenous contrast. Multiplanar CT image reconstructions and MIPs were obtained to evaluate the vascular anatomy. Carotid stenosis measurements (when applicable) are obtained utilizing NASCET criteria, using the distal internal carotid diameter as the denominator. CONTRAST:  158mL OMNIPAQUE IOHEXOL 350 MG/ML SOLN COMPARISON:  None. FINDINGS: CT HEAD FINDINGS Brain: Mild age related volume loss. Mild chronic appearing small vessel ischemic change of the cerebral hemispheric white matter. No sign of acute infarction, mass lesion, hemorrhage, hydrocephalus or extra-axial collection. Vascular: Minimal atherosclerotic calcification of the vessels at the base of the brain. Skull: Negative Sinuses: Clear Orbits: Normal Review of the MIP images confirms the above findings CTA NECK FINDINGS Aortic arch: Aortic atherosclerotic calcification. Branching pattern is normal without origin stenosis. Right carotid system: Common carotid artery widely patent to the bifurcation. Calcified plaque at the carotid bifurcation but no stenosis. Cervical ICA widely patent. Left carotid system: Common carotid artery widely patent to the bifurcation. Carotid bifurcation is normal without soft or calcified plaque. No stenosis. Cervical ICA widely patent. Vertebral arteries: No flow limiting subclavian disease. Both vertebral artery origins are widely patent. The right vertebral artery is dominant. Both vertebral arteries are patent through the cervical region to the foramen magnum. Skeleton: Ordinary cervical spondylosis. Some degree of spinal stenosis at C5-6. Chronic fusion at C4-5. Other neck: No mass or lymphadenopathy. Upper chest: Normal Review of the MIP images confirms the above findings CTA HEAD  FINDINGS Anterior circulation: Both internal carotid arteries are patent through the skull base and siphon regions. No siphon stenosis. The anterior and middle cerebral vessels are patent without proximal stenosis, aneurysm or vascular malformation. No large or medium vessel occlusion. Fetal origin of the right PCA. Posterior circulation: Both vertebral arteries are widely patent through the foramen magnum to the basilar. No basilar stenosis. Posterior circulation branch vessels are normal. Venous sinuses: Patent and normal. Anatomic variants: None significant. Review of the MIP images confirms the above findings IMPRESSION: No acute head CT finding. Chronic small-vessel ischemic changes of the cerebral hemispheric white matter. No acute large or medium vessel occlusion. Aortic atherosclerosis. Minimal carotid bifurcation atherosclerosis on the right. No stenosis on either side. No vertebral artery or other posterior circulation pathology. Electronically Signed   By: Nelson Chimes M.D.   On: 07/29/2020 16:35     Assessment & Plan:  Plan  I am having Aalina A. Maciver maintain her ICaps Areds 2, diphenhydramine-acetaminophen, Vitamin D3, lisinopril, rosuvastatin, meclizine, hydrochlorothiazide, and MELATONIN ER PO.  No  orders of the defined types were placed in this encounter.   Problem List Items Addressed This Visit    None    Visit Diagnoses    Abnormal x-ray of neck    -  Primary   Relevant Orders   US Carotid Bilateral    per xray at dentist US carotid orderd  Follow-up: Return if symptoms worsen or fail to improve.  Ann Held, DO

## 2020-10-11 ENCOUNTER — Ambulatory Visit: Payer: Medicare HMO | Attending: Orthopaedic Surgery | Admitting: Physical Therapy

## 2020-10-11 ENCOUNTER — Other Ambulatory Visit: Payer: Self-pay

## 2020-10-11 ENCOUNTER — Encounter: Payer: Self-pay | Admitting: Physical Therapy

## 2020-10-11 DIAGNOSIS — M545 Low back pain, unspecified: Secondary | ICD-10-CM | POA: Insufficient documentation

## 2020-10-11 DIAGNOSIS — M546 Pain in thoracic spine: Secondary | ICD-10-CM | POA: Diagnosis not present

## 2020-10-11 DIAGNOSIS — M542 Cervicalgia: Secondary | ICD-10-CM | POA: Diagnosis not present

## 2020-10-11 NOTE — Therapy (Signed)
[address] [place], [place], [address] Phone: [phone]   Fax:  [phone]  Physical Therapy Treatment  Patient Details  Name: Cindy Velasquez MRN: [id number] Date of Birth: 1941/06/28 Referring Provider (PT): Etter Sjogren   Encounter Date: 10/11/2020   PT End of Session - 10/11/20 1349    Visit Number 11    Date for PT Re-Evaluation 10/21/20    Authorization Type [employer]    PT Start Time 1310    PT Stop Time 1352    PT Time Calculation (min) 42 min    Activity Tolerance Patient tolerated treatment well    Behavior During Therapy [facility] for tasks assessed/performed           Past Medical History:  Diagnosis Date  . Adenomatous colon polyp   . Cancer ([place]) 03/2015   BCC R tibia   . Diverticulosis of colon   . GERD (gastroesophageal reflux disease)   . Hip pain   . Hyperlipidemia   . Hyperplastic colon polyp   . Hypertension   . Lactose intolerance   . Loose stools   . Lower back pain   . Osteoporosis   . Palpitations     Past Surgical History:  Procedure Laterality Date  . EYE SURGERY  2012   B/L 01/2011  02/2011  . HAMMER TOE SURGERY     Bilateral  . ROTATOR CUFF REPAIR     Left  . TUBAL LIGATION      There were no vitals filed for this visit.   Subjective Assessment - 10/11/20 1313    Subjective I am still doing pretty good, I have some questions about what to do next    Currently in Pain? Yes    Pain Score 1     Pain Location Neck    Pain Relieving Factors seems like everything we have done helps                             [facility] Adult PT Treatment/Exercise - 10/11/20 0001      Knee/Hip Exercises: Aerobic   Nustep L5 x6 min     Other Aerobic UBE L2 x 2 min each       Knee/Hip Exercises: Machines for Strengthening   Other Machine lats 20#, seated row 20# 2x10, Shoulder Ext 5lb 2x10 , triceps 20# 2x10, biceps 5#                  PT Education - 10/11/20 1348     Education Details WEnt over gym activities, weight, adjustments, posture and form.Wrote all of this down for her and how to do with her class that she is doing at the gy,    Person(s) Educated Patient    Methods Explanation;Demonstration;Handout;Verbal cues;Tactile cues    Comprehension Verbalized understanding;Returned demonstration;Verbal cues required            PT Short Term Goals - 08/30/20 1223      PT SHORT TERM GOAL #1   Title independent with initial HEP    Status Achieved             PT Long Term Goals - 10/11/20 1434      PT LONG TERM GOAL #1   Title increase cervical ROM 25% for easier backing up of the car    Status Achieved      PT LONG TERM GOAL #2   Title increase  lumbar ROM 25%    Status Achieved      PT LONG TERM GOAL #3   Title decrease pain 50%    Status Achieved      PT LONG TERM GOAL #4   Title report no difficulty sleeping    Status Achieved                 Plan - 10/11/20 1354    Clinical Impression Statement Patient doing very well, we went over a lot of HEP and gym exercises and safety.  I wrote down the exercises and weights and some cues for posture and form.  She was able to demonstrate understanding    PT Next Visit Plan d/c with goals met    Consulted and Agree with Plan of Care Patient           Patient will benefit from skilled therapeutic intervention in order to improve the following deficits and impairments:  Decreased range of motion, Decreased activity tolerance, Pain, Impaired flexibility, Decreased strength, Decreased mobility, Decreased balance, Postural dysfunction, Improper body mechanics, Increased muscle spasms  Visit Diagnosis: Cervicalgia  Acute bilateral low back pain without sciatica  Pain in thoracic spine     Problem List Patient Active Problem List   Diagnosis Date Noted  . Thoracic stenosis 07/27/2020  . Dyslipidemia 07/27/2020  . Prediabetes 12/13/2018  . Vitamin D deficiency 12/13/2018  .  Overweight (BMI 25.0-29.9) 12/13/2018  . Preventative health care 09/15/2018  . Bilateral cold feet 06/17/2018  . Idiopathic peripheral neuropathy 06/17/2018  . Lower extremity edema 06/17/2018  . Right shoulder pain 08/20/2016  . Knee pain, bilateral 04/23/2015  . Urticaria 10/13/2014  . Neuropathy 10/13/2014  . Fatigue 10/13/2014  . Abrasion of face 08/01/2013  . Rib pain on left side 07/25/2013  . Tinea cruris 06/22/2012  . Eustachian tube dysfunction 04/07/2012  . Cerumen impaction 04/07/2012  . URI 10/30/2010  . CHANGE IN BOWELS 04/03/2010  . FECAL OCCULT BLOOD 04/03/2010  . PERSONAL HX COLONIC POLYPS 04/03/2010  . DYSPEPSIA 03/04/2010  . GUAIAC POSITIVE STOOL 03/04/2010  . HEMATURIA UNSPECIFIED 06/28/2009  . DYSURIA 06/28/2009  . HAMMER TOE 12/28/2008  . POSTMENOPAUSAL STATUS 12/28/2008  . BACK PAIN, LUMBAR 07/11/2008  . BENIGN NEOPLASM OF SKIN SITE UNSPECIFIED 02/28/2008  . Hyperlipidemia 04/15/2007  . Essential hypertension 04/15/2007  . DIVERTICULOSIS, COLON 04/15/2007  . THUMB PAIN, RIGHT 04/15/2007  . OSTEOPOROSIS 04/15/2007    Cindy Boast., PT 10/11/2020, 2:34 PM  [address] [place], [place], [address] Phone: [phone]   Fax:  [phone]  Name: Cindy Velasquez MRN: [id number] Date of Birth: 1941/08/19

## 2020-10-12 ENCOUNTER — Ambulatory Visit (HOSPITAL_BASED_OUTPATIENT_CLINIC_OR_DEPARTMENT_OTHER)
Admission: RE | Admit: 2020-10-12 | Discharge: 2020-10-12 | Disposition: A | Discharge status: Home / Self Care | Payer: Medicare HMO | Source: Ambulatory Visit | Attending: Family Medicine | Admitting: Family Medicine

## 2020-10-12 DIAGNOSIS — R9389 Abnormal findings on diagnostic imaging of other specified body structures: Secondary | ICD-10-CM | POA: Insufficient documentation

## 2020-10-12 DIAGNOSIS — I6523 Occlusion and stenosis of bilateral carotid arteries: Secondary | ICD-10-CM | POA: Diagnosis not present

## 2020-10-15 ENCOUNTER — Other Ambulatory Visit: Payer: Self-pay

## 2020-10-15 DIAGNOSIS — E785 Hyperlipidemia, unspecified: Secondary | ICD-10-CM

## 2020-10-15 DIAGNOSIS — H43812 Vitreous degeneration, left eye: Secondary | ICD-10-CM | POA: Diagnosis not present

## 2020-10-15 DIAGNOSIS — H0102B Squamous blepharitis left eye, upper and lower eyelids: Secondary | ICD-10-CM | POA: Diagnosis not present

## 2020-10-15 DIAGNOSIS — I10 Essential (primary) hypertension: Secondary | ICD-10-CM

## 2020-10-15 DIAGNOSIS — Z961 Presence of intraocular lens: Secondary | ICD-10-CM | POA: Diagnosis not present

## 2020-10-15 DIAGNOSIS — H0102A Squamous blepharitis right eye, upper and lower eyelids: Secondary | ICD-10-CM | POA: Diagnosis not present

## 2020-10-15 DIAGNOSIS — H353132 Nonexudative age-related macular degeneration, bilateral, intermediate dry stage: Secondary | ICD-10-CM | POA: Diagnosis not present

## 2020-10-15 DIAGNOSIS — H10413 Chronic giant papillary conjunctivitis, bilateral: Secondary | ICD-10-CM | POA: Diagnosis not present

## 2020-10-15 DIAGNOSIS — H26492 Other secondary cataract, left eye: Secondary | ICD-10-CM | POA: Diagnosis not present

## 2020-10-16 ENCOUNTER — Telehealth: Payer: Self-pay | Admitting: Family Medicine

## 2020-10-16 NOTE — Progress Notes (Signed)
  Chronic Care Management   Outreach Note  10/16/2020 Name: KAYELA HUMPHRES MRN: [id number] DOB: 08-23-1941  Referred by: Ann Held, DO Reason for referral : No chief complaint on file.   An unsuccessful telephone outreach was attempted today. The patient was referred to the pharmacist for assistance with care management and care coordination.   Follow Up Plan:   Carley Perdue [employer] Scheduler

## 2020-10-17 ENCOUNTER — Telehealth: Payer: Self-pay | Admitting: Family Medicine

## 2020-10-17 NOTE — Progress Notes (Signed)
  Chronic Care Management   Outreach Note  10/17/2020 Name: LAQUINTA HAZELL MRN: [id number] DOB: [birth year]  Referred by: Ann Held, DO Reason for referral : No chief complaint on file.   A second unsuccessful telephone outreach was attempted today. The patient was referred to pharmacist for assistance with care management and care coordination.  Follow Up Plan:   Carley Perdue [employer] Scheduler

## 2020-10-18 ENCOUNTER — Ambulatory Visit: Payer: Medicare HMO

## 2020-11-05 ENCOUNTER — Telehealth: Payer: Self-pay | Admitting: Family Medicine

## 2020-11-05 NOTE — Progress Notes (Signed)
°  Chronic Care Management   Note  11/05/2020 Name: Cindy Velasquez MRN: [id number] DOB: [birth year]  Cindy Velasquez is a 79 y.o. year old female who is a primary care patient of Ann Held, DO. I reached out to [employer] by phone today in response to a referral sent by Ms. Carmon Ginsberg Schaper's PCP, Ann Held, DO.   Ms. Egner was given information about Chronic Care Management services today including:  1. CCM service includes personalized support from designated clinical staff supervised by her physician, including individualized plan of care and coordination with other care providers 2. 24/7 contact phone numbers for assistance for urgent and routine care needs. 3. Service will only be billed when office clinical staff spend 20 minutes or more in a month to coordinate care. 4. Only one practitioner may furnish and bill the service in a calendar month. 5. The patient may stop CCM services at any time (effective at the end of the month) by phone call to the office staff.   Patient wishes to consider information provided and/or speak with a member of the care team before deciding about enrollment in care management services.   Follow up plan:   Carley Perdue [employer] Scheduler

## 2020-12-05 ENCOUNTER — Other Ambulatory Visit: Payer: Self-pay | Admitting: Family Medicine

## 2020-12-05 DIAGNOSIS — I10 Essential (primary) hypertension: Secondary | ICD-10-CM

## 2020-12-11 ENCOUNTER — Other Ambulatory Visit: Payer: Self-pay | Admitting: Family Medicine

## 2020-12-11 ENCOUNTER — Encounter: Payer: Self-pay | Admitting: Family Medicine

## 2020-12-11 DIAGNOSIS — R21 Rash and other nonspecific skin eruption: Secondary | ICD-10-CM

## 2020-12-11 MED ORDER — CLOTRIMAZOLE-BETAMETHASONE 1-0.05 % EX CREA: 1.0000 "application " | TOPICAL_CREAM | Freq: Two times a day (BID) | CUTANEOUS | 1 refills | Status: DC

## 2020-12-11 NOTE — Telephone Encounter (Signed)
Sent in----it was a historical med

## 2020-12-19 DIAGNOSIS — H5213 Myopia, bilateral: Secondary | ICD-10-CM | POA: Diagnosis not present

## 2020-12-19 DIAGNOSIS — H0102B Squamous blepharitis left eye, upper and lower eyelids: Secondary | ICD-10-CM | POA: Diagnosis not present

## 2020-12-19 DIAGNOSIS — Z961 Presence of intraocular lens: Secondary | ICD-10-CM | POA: Diagnosis not present

## 2020-12-19 DIAGNOSIS — H524 Presbyopia: Secondary | ICD-10-CM | POA: Diagnosis not present

## 2020-12-19 DIAGNOSIS — H0102A Squamous blepharitis right eye, upper and lower eyelids: Secondary | ICD-10-CM | POA: Diagnosis not present

## 2020-12-19 DIAGNOSIS — H10413 Chronic giant papillary conjunctivitis, bilateral: Secondary | ICD-10-CM | POA: Diagnosis not present

## 2020-12-19 DIAGNOSIS — H04223 Epiphora due to insufficient drainage, bilateral lacrimal glands: Secondary | ICD-10-CM | POA: Diagnosis not present

## 2020-12-19 DIAGNOSIS — H52223 Regular astigmatism, bilateral: Secondary | ICD-10-CM | POA: Diagnosis not present

## 2021-02-13 ENCOUNTER — Other Ambulatory Visit: Payer: Self-pay | Admitting: Family Medicine

## 2021-02-13 ENCOUNTER — Other Ambulatory Visit: Payer: Self-pay

## 2021-02-13 ENCOUNTER — Telehealth (INDEPENDENT_AMBULATORY_CARE_PROVIDER_SITE_OTHER): Payer: Medicare HMO | Admitting: Family Medicine

## 2021-02-13 ENCOUNTER — Encounter: Payer: Self-pay | Admitting: Family Medicine

## 2021-02-13 DIAGNOSIS — J069 Acute upper respiratory infection, unspecified: Secondary | ICD-10-CM | POA: Diagnosis not present

## 2021-02-13 DIAGNOSIS — J029 Acute pharyngitis, unspecified: Secondary | ICD-10-CM

## 2021-02-13 MED ORDER — FLUTICASONE PROPIONATE 50 MCG/ACT NA SUSP: 2.0000 | Freq: Every day | NASAL | 6 refills | Status: DC

## 2021-02-13 MED ORDER — AMOXICILLIN 875 MG PO TABS: 875.0000 mg | ORAL_TABLET | Freq: Two times a day (BID) | ORAL | 0 refills | Status: DC

## 2021-02-13 MED FILL — FLUTICASONE PROP 50 MCG SPR: 50 | 30 days supply | Qty: 16 | Fill #0

## 2021-02-13 MED FILL — AMOXICILLIN 875 MG TABS: 875 | 10 days supply | Qty: 20 | Fill #0

## 2021-02-13 NOTE — Progress Notes (Signed)
Virtual Visit via Video Note  I connected with Cindy Velasquez on 02/13/21 at 11:00 AM EST by a video enabled telemedicine application and verified that I am speaking with the correct person using two identifiers.  Location/ participants  Patient: home alone  Provider: home    I discussed the limitations of evaluation and management by telemedicine and the availability of in person appointments. The patient expressed understanding and agreed to proceed.  History of Present Illness: Pt is home c/o 3 day history of cough, congestion   She had diarrhea for 1 day.  No fevers,  No sinus pressure/ headaches   + nasal congestion  Cough is dry --- she has not taken any otc meds  She has a hx of tonsillitis  covid test neg    Observations/Objective: There were no vitals filed for this visit.  No temp Pt is in nad No sob   Assessment and Plan: 1. Pharyngitis, unspecified etiology abx per orders Take antihistamine and mucinex prn rto prn  - amoxicillin (AMOXIL) 875 MG tablet; Take 1 tablet (875 mg total) by mouth 2 (two) times daily.  Dispense: 20 tablet; Refill: 0  2. URI, acute otc antihistamine and flonase  Call or rto prn covid test neg  - fluticasone (FLONASE) 50 MCG/ACT nasal spray; Place 2 sprays into both nostrils daily.  Dispense: 16 g; Refill: 6   Follow Up Instructions:    I discussed the assessment and treatment plan with the patient. The patient was provided an opportunity to ask questions and all were answered. The patient agreed with the plan and demonstrated an understanding of the instructions.   The patient was advised to call back or seek an in-person evaluation if the symptoms worsen or if the condition fails to improve as anticipated.     Ann Held, DO

## 2021-02-21 ENCOUNTER — Ambulatory Visit (INDEPENDENT_AMBULATORY_CARE_PROVIDER_SITE_OTHER): Payer: Medicare HMO | Admitting: Family Medicine

## 2021-02-21 ENCOUNTER — Encounter: Payer: Self-pay | Admitting: Family Medicine

## 2021-02-21 ENCOUNTER — Other Ambulatory Visit: Payer: Self-pay

## 2021-02-21 VITALS — BP 134/82 | HR 80 | Temp 98.4°F | Resp 16 | Ht 59.0 in | Wt 160.8 lb

## 2021-02-21 DIAGNOSIS — J069 Acute upper respiratory infection, unspecified: Secondary | ICD-10-CM | POA: Diagnosis not present

## 2021-02-21 MED ORDER — PREDNISONE 10 MG PO TABS: ORAL_TABLET | ORAL | 0 refills | Status: DC

## 2021-02-21 MED ORDER — FLUTICASONE PROPIONATE 50 MCG/ACT NA SUSP: 2.0000 | Freq: Every day | NASAL | 6 refills | Status: DC

## 2021-02-21 MED ORDER — LORATADINE 10 MG PO TABS: 10.0000 mg | ORAL_TABLET | Freq: Every day | ORAL | 11 refills | Status: DC

## 2021-02-21 NOTE — Progress Notes (Signed)
Patient ID: Cindy Velasquez, female    DOB: May 21, 1941  Age: 80 y.o. MRN: [id number]    Subjective:  Subjective  HPI Cindy Velasquez presents for sinus congestion .  The mucus is clear but plentiful.  She is not taking any otc meds   She had 2 neg home covid tests   Review of Systems  Constitutional: Negative for appetite change, diaphoresis, fatigue and unexpected weight change.  HENT: Positive for congestion, postnasal drip and sneezing. Negative for sinus pressure, sinus pain and sore throat.   Eyes: Negative for pain, redness and visual disturbance.  Respiratory: Negative for cough, chest tightness, shortness of breath and wheezing.   Cardiovascular: Negative for chest pain, palpitations and leg swelling.  Endocrine: Negative for cold intolerance, heat intolerance, polydipsia, polyphagia and polyuria.  Genitourinary: Negative for difficulty urinating, dysuria and frequency.  Neurological: Negative for dizziness, light-headedness, numbness and headaches.    History Past Medical History:  Diagnosis Date  . Adenomatous colon polyp   . Cancer ([place]) 03/2015   BCC R tibia   . Diverticulosis of colon   . GERD (gastroesophageal reflux disease)   . Hip pain   . Hyperlipidemia   . Hyperplastic colon polyp   . Hypertension   . Lactose intolerance   . Loose stools   . Lower back pain   . Osteoporosis   . Palpitations     She has a past surgical history that includes Tubal ligation; Hammer toe surgery; Rotator cuff repair; and Eye surgery (2012).   Her family history includes Cancer (age of onset: 43) in her mother; Heart disease in her brother and father; Hyperlipidemia in her mother; Hypertension in her brother, brother, father, and mother; Obesity in her mother; Stroke in her father; Testicular cancer (age of onset: 67) in her brother.She reports that she quit smoking about 29 years ago. Her smoking use included cigarettes. She has a 15.00 pack-year smoking history. She has never used  smokeless tobacco. She reports current alcohol use of about 5.0 standard drinks of alcohol per week. She reports that she does not use drugs.  Current Outpatient Medications on File Prior to Visit  Medication Sig Dispense Refill  . Cholecalciferol (VITAMIN D3) 125 MCG (5000 UT) CAPS Take 1 capsule (5,000 Units total) by mouth daily. 30 capsule 0  . clotrimazole-betamethasone (LOTRISONE) cream Apply 1 application topically 2 (two) times daily. Use as needed for rash 30 g 1  . hydrochlorothiazide (HYDRODIURIL) 25 MG tablet TAKE 1/2 TABLET(12.5 MG) BY MOUTH EVERY MORNING 45 tablet 1  . lisinopril (ZESTRIL) 20 MG tablet Take 1 tablet (20 mg total) by mouth daily. 90 tablet 1  . MELATONIN ER PO Take by mouth.    . Multiple Vitamins-Minerals (ICAPS AREDS 2) CAPS Take 2 capsules by mouth daily.     . rosuvastatin (CRESTOR) 20 MG tablet TAKE 1 TABLET BY MOUTH EVERY DAY 90 tablet 1  . amoxicillin (AMOXIL) 875 MG tablet Take 1 tablet (875 mg total) by mouth 2 (two) times daily. (Patient not taking: Reported on 02/21/2021) 20 tablet 0   No current facility-administered medications on file prior to visit.     Objective:  Objective  Physical Exam Vitals and nursing note reviewed.  Constitutional:      Appearance: She is well-developed.  HENT:     Head: Normocephalic and atraumatic.     Nose: Nose normal. No congestion or rhinorrhea.  Eyes:     Conjunctiva/sclera: Conjunctivae normal.  Neck:  Thyroid: No thyromegaly.     Vascular: No carotid bruit or JVD.  Cardiovascular:     Rate and Rhythm: Normal rate and regular rhythm.     Heart sounds: Normal heart sounds. No murmur heard.   Pulmonary:     Effort: Pulmonary effort is normal. No respiratory distress.     Breath sounds: Normal breath sounds. No wheezing or rales.  Chest:     Chest wall: No tenderness.  Musculoskeletal:     Cervical back: Normal range of motion and neck supple.  Neurological:     Mental Status: She is alert and  oriented to person, place, and time.    BP 134/82 (BP Location: Right Arm, Patient Position: Sitting, Cuff Size: Normal)   Pulse 80   Temp 98.4 F (36.9 C) (Oral)   Resp 16   Ht 4\' 11"  (1.499 m)   Wt 160 lb 12.8 oz (72.9 kg)   SpO2 95%   BMI 32.48 kg/m  Wt Readings from Last 3 Encounters:  02/21/21 160 lb 12.8 oz (72.9 kg)  10/04/20 158 lb 3.2 oz (71.8 kg)  09/24/20 151 lb 12.8 oz (68.9 kg)     Lab Results  Component Value Date   WBC 7.1 09/24/2020   HGB 14.5 09/24/2020   HCT 44.1 09/24/2020   PLT 275 09/24/2020   GLUCOSE 72 09/24/2020   CHOL 155 09/24/2020   TRIG 142 09/24/2020   HDL 48 (L) 09/24/2020   LDLDIRECT 129.1 08/23/2007   LDLCALC 83 09/24/2020   ALT 24 09/24/2020   AST 22 09/24/2020   NA 139 09/24/2020   K 4.6 09/24/2020   CL 100 09/24/2020   CREATININE 0.75 09/24/2020   BUN 19 09/24/2020   CO2 31 09/24/2020   TSH 1.29 09/15/2019   INR 1.0 07/29/2020   HGBA1C 5.9 (H) 08/02/2020    US Carotid Bilateral  Result Date: 10/12/2020 CLINICAL DATA:  Atherosclerosis by dental x-ray EXAM: BILATERAL CAROTID DUPLEX ULTRASOUND TECHNIQUE: Pearline Cables scale imaging, color Doppler and duplex ultrasound were performed of bilateral carotid and vertebral arteries in the neck. COMPARISON:  07/29/2020 CTA neck FINDINGS: Criteria: Quantification of carotid stenosis is based on velocity parameters that correlate the residual internal carotid diameter with NASCET-based stenosis levels, using the diameter of the distal internal carotid lumen as the denominator for stenosis measurement. The following velocity measurements were obtained: RIGHT ICA: 92/23 cm/sec CCA: 41/32 cm/sec SYSTOLIC ICA/CCA RATIO:  1.6 ECA: 109 cm/sec LEFT ICA: 94/21 cm/sec CCA: 44/01 cm/sec SYSTOLIC ICA/CCA RATIO:  1.4 ECA: 109 cm/sec RIGHT CAROTID ARTERY: Minor echogenic shadowing plaque formation. No hemodynamically significant right ICA stenosis, velocity elevation, or turbulent flow. Degree of narrowing less than  50%. RIGHT VERTEBRAL ARTERY:  Normal antegrade flow LEFT CAROTID ARTERY: Similar scattered minor echogenic plaque formation. No hemodynamically significant left ICA stenosis, velocity elevation, or turbulent flow. LEFT VERTEBRAL ARTERY:  Normal antegrade flow Upper extremity blood pressures: RIGHT: 139/66 LEFT: 151/65 IMPRESSION: Minor carotid atherosclerosis. No hemodynamically significant ICA stenosis. Degree of narrowing less than 50% bilaterally by ultrasound criteria. Patent antegrade vertebral flow bilaterally Electronically Signed   By: Jerilynn Mages.  Shick M.D.   On: 10/12/2020 14:15     Assessment & Plan:  Plan  I am having Cindy Velasquez start on loratadine and predniSONE. I am also having her maintain her ICaps Areds 2, Vitamin D3, rosuvastatin, hydrochlorothiazide, MELATONIN ER PO, lisinopril, clotrimazole-betamethasone, amoxicillin, and fluticasone.  Meds ordered this encounter  Medications  . loratadine (CLARITIN) 10 MG tablet  Sig: Take 1 tablet (10 mg total) by mouth daily.    Dispense:  30 tablet    Refill:  11  . fluticasone (FLONASE) 50 MCG/ACT nasal spray    Sig: Place 2 sprays into both nostrils daily.    Dispense:  16 g    Refill:  6  . predniSONE (DELTASONE) 10 MG tablet    Sig: TAKE 3 TABLETS PO QD FOR 3 DAYS THEN TAKE 2 TABLETS PO QD FOR 3 DAYS THEN TAKE 1 TABLET PO QD FOR 3 DAYS THEN TAKE 1/2 TAB PO QD FOR 3 DAYS    Dispense:  20 tablet    Refill:  0    Problem List Items Addressed This Visit   None   Visit Diagnoses    Viral upper respiratory tract infection    -  Primary   Relevant Medications   loratadine (CLARITIN) 10 MG tablet   Other Relevant Orders   Novel Coronavirus, NAA ([facility])   URI, acute       Relevant Medications   fluticasone (FLONASE) 50 MCG/ACT nasal spray   predniSONE (DELTASONE) 10 MG tablet    pred taper , flonase and clariin  covid test done  F/u next week if no better   Follow-up: Return if symptoms worsen or fail to improve.  Ann Held, DO

## 2021-02-21 NOTE — Patient Instructions (Signed)

## 2021-02-22 ENCOUNTER — Telehealth: Payer: Self-pay | Admitting: Family Medicine

## 2021-02-22 LAB — NOVEL CORONAVIRUS, NAA: SARS-CoV-2, NAA: NOT DETECTED

## 2021-02-22 LAB — SARS-COV-2, NAA 2 DAY TAT

## 2021-02-22 NOTE — Telephone Encounter (Signed)
I spoke to her about this during ov--- it could help with the inflammation in nasal passages and help with sneezing/ cough  She can just take ibuprofen if she does not want to take the pred but she was desperate to get rid of it yesterday

## 2021-02-22 NOTE — Telephone Encounter (Signed)
Patient states she has some question of why she was prescribe prednisone for a cold. She would like to speak to cma

## 2021-02-22 NOTE — Telephone Encounter (Signed)
Spoke with patient. Pt verbalized understanding  °

## 2021-02-22 NOTE — Telephone Encounter (Signed)
Spoke with patient. Pt concerned about taking a "extreme medication" like Prednisone and would like to know if there [place] alternate option. Pt states being concerning about taking medication and the side effects. I advised pt that the prednisone will help with the inflammation. Pt states she will "suck it up"  and take the medication if no other option. Please advise

## 2021-03-19 DIAGNOSIS — H0102A Squamous blepharitis right eye, upper and lower eyelids: Secondary | ICD-10-CM | POA: Diagnosis not present

## 2021-03-19 DIAGNOSIS — H0102B Squamous blepharitis left eye, upper and lower eyelids: Secondary | ICD-10-CM | POA: Diagnosis not present

## 2021-03-19 DIAGNOSIS — H04223 Epiphora due to insufficient drainage, bilateral lacrimal glands: Secondary | ICD-10-CM | POA: Diagnosis not present

## 2021-03-19 DIAGNOSIS — H43812 Vitreous degeneration, left eye: Secondary | ICD-10-CM | POA: Diagnosis not present

## 2021-03-19 DIAGNOSIS — H26491 Other secondary cataract, right eye: Secondary | ICD-10-CM | POA: Diagnosis not present

## 2021-03-19 DIAGNOSIS — Z961 Presence of intraocular lens: Secondary | ICD-10-CM | POA: Diagnosis not present

## 2021-03-19 DIAGNOSIS — H353132 Nonexudative age-related macular degeneration, bilateral, intermediate dry stage: Secondary | ICD-10-CM | POA: Diagnosis not present

## 2021-03-19 DIAGNOSIS — H10413 Chronic giant papillary conjunctivitis, bilateral: Secondary | ICD-10-CM | POA: Diagnosis not present

## 2021-03-25 ENCOUNTER — Ambulatory Visit: Payer: Medicare HMO | Admitting: Family Medicine

## 2021-04-18 ENCOUNTER — Encounter (INDEPENDENT_AMBULATORY_CARE_PROVIDER_SITE_OTHER): Payer: Medicare HMO | Admitting: Ophthalmology

## 2021-04-22 DIAGNOSIS — H26491 Other secondary cataract, right eye: Secondary | ICD-10-CM | POA: Diagnosis not present

## 2021-04-23 ENCOUNTER — Ambulatory Visit: Payer: Medicare HMO | Admitting: Family Medicine

## 2021-04-24 ENCOUNTER — Encounter (INDEPENDENT_AMBULATORY_CARE_PROVIDER_SITE_OTHER): Payer: Medicare HMO | Admitting: Ophthalmology

## 2021-04-24 ENCOUNTER — Other Ambulatory Visit: Payer: Self-pay

## 2021-04-24 DIAGNOSIS — H353132 Nonexudative age-related macular degeneration, bilateral, intermediate dry stage: Secondary | ICD-10-CM

## 2021-04-24 DIAGNOSIS — I10 Essential (primary) hypertension: Secondary | ICD-10-CM | POA: Diagnosis not present

## 2021-04-24 DIAGNOSIS — H43813 Vitreous degeneration, bilateral: Secondary | ICD-10-CM | POA: Diagnosis not present

## 2021-04-24 DIAGNOSIS — H35033 Hypertensive retinopathy, bilateral: Secondary | ICD-10-CM | POA: Diagnosis not present

## 2021-05-21 ENCOUNTER — Ambulatory Visit: Payer: Medicare HMO | Admitting: Family Medicine

## 2021-05-28 ENCOUNTER — Ambulatory Visit: Payer: Medicare HMO | Admitting: Family Medicine

## 2021-06-04 ENCOUNTER — Ambulatory Visit (INDEPENDENT_AMBULATORY_CARE_PROVIDER_SITE_OTHER): Payer: Medicare HMO | Admitting: Family Medicine

## 2021-06-04 ENCOUNTER — Other Ambulatory Visit (HOSPITAL_BASED_OUTPATIENT_CLINIC_OR_DEPARTMENT_OTHER): Payer: Self-pay | Admitting: Family Medicine

## 2021-06-04 ENCOUNTER — Other Ambulatory Visit: Payer: Self-pay

## 2021-06-04 VITALS — BP 120/60 | HR 83 | Temp 98.7°F | Resp 18 | Ht 59.0 in | Wt 163.4 lb

## 2021-06-04 DIAGNOSIS — S60512A Abrasion of left hand, initial encounter: Secondary | ICD-10-CM | POA: Diagnosis not present

## 2021-06-04 DIAGNOSIS — E785 Hyperlipidemia, unspecified: Secondary | ICD-10-CM | POA: Diagnosis not present

## 2021-06-04 DIAGNOSIS — G609 Hereditary and idiopathic neuropathy, unspecified: Secondary | ICD-10-CM

## 2021-06-04 DIAGNOSIS — L089 Local infection of the skin and subcutaneous tissue, unspecified: Secondary | ICD-10-CM | POA: Diagnosis not present

## 2021-06-04 DIAGNOSIS — Z1231 Encounter for screening mammogram for malignant neoplasm of breast: Secondary | ICD-10-CM

## 2021-06-04 DIAGNOSIS — I10 Essential (primary) hypertension: Secondary | ICD-10-CM

## 2021-06-04 DIAGNOSIS — Z23 Encounter for immunization: Secondary | ICD-10-CM | POA: Diagnosis not present

## 2021-06-04 LAB — COMPREHENSIVE METABOLIC PANEL
ALT: 28 U/L (ref 0–35)
AST: 24 U/L (ref 0–37)
Albumin: 4.1 g/dL (ref 3.5–5.2)
Alkaline Phosphatase: 86 U/L (ref 39–117)
BUN: 18 mg/dL (ref 6–23)
CO2: 33 mEq/L — ABNORMAL HIGH (ref 19–32)
Calcium: 10 mg/dL (ref 8.4–10.5)
Chloride: 100 mEq/L (ref 96–112)
Creatinine, Ser: 0.69 mg/dL (ref 0.40–1.20)
GFR: 82.29 mL/min (ref >60.00)
Glucose, Bld: 79 mg/dL (ref 70–99)
Potassium: 4.4 mEq/L (ref 3.5–5.1)
Sodium: 139 mEq/L (ref 135–145)
Total Bilirubin: 0.5 mg/dL (ref 0.2–1.2)
Total Protein: 7.2 g/dL (ref 6.0–8.3)

## 2021-06-04 LAB — LIPID PANEL
Cholesterol: 142 mg/dL (ref 0–200)
HDL: 40.2 mg/dL (ref >39.00)
LDL Cholesterol: 68 mg/dL (ref 0–99)
NonHDL: 101.52
Total CHOL/HDL Ratio: 4
Triglycerides: 170 mg/dL — ABNORMAL HIGH (ref 0.0–149.0)
VLDL: 34 mg/dL (ref 0.0–40.0)

## 2021-06-04 MED ORDER — LISINOPRIL 20 MG PO TABS: 20.0000 mg | ORAL_TABLET | Freq: Every day | ORAL | 1 refills | Status: DC

## 2021-06-04 NOTE — Patient Instructions (Signed)
https://www.nhlbi.nih.gov/files/docs/public/heart/dash_brief.pdf">  DASH Eating Plan DASH stands for Dietary Approaches to Stop Hypertension. The DASH eating plan is a healthy eating plan that has been shown to: Reduce high blood pressure (hypertension). Reduce your risk for type 2 diabetes, heart disease, and stroke. Help with weight loss. What are tips for following this plan? Reading food labels Check food labels for the amount of salt (sodium) per serving. Choose foods with less than 5 percent of the Daily Value of sodium. Generally, foods with less than 300 milligrams (mg) of sodium per serving fit into this eating plan. To find whole grains, look for the word "whole" as the first word in the ingredient list. Shopping Buy products labeled as "low-sodium" or "no salt added." Buy fresh foods. Avoid canned foods and pre-made or frozen meals. Cooking Avoid adding salt when cooking. Use salt-free seasonings or herbs instead of table salt or sea salt. Check with your health care provider or pharmacist before using salt substitutes. Do not fry foods. Cook foods using healthy methods such as baking, boiling, grilling, roasting, and broiling instead. Cook with heart-healthy oils, such as olive, canola, avocado, soybean, or sunflower oil. Meal planning  Eat a balanced diet that includes: 4 or more servings of fruits and 4 or more servings of vegetables each day. Try to fill one-half of your plate with fruits and vegetables. 6-8 servings of whole grains each day. Less than 6 oz (170 g) of lean meat, poultry, or fish each day. A 3-oz (85-g) serving of meat is about the same size as a deck of cards. One egg equals 1 oz (28 g). 2-3 servings of low-fat dairy each day. One serving is 1 cup (237 mL). 1 serving of nuts, seeds, or beans 5 times each week. 2-3 servings of heart-healthy fats. Healthy fats called omega-3 fatty acids are found in foods such as walnuts, flaxseeds, fortified milks, and eggs.  These fats are also found in cold-water fish, such as sardines, salmon, and mackerel. Limit how much you eat of: Canned or prepackaged foods. Food that is high in trans fat, such as some fried foods. Food that is high in saturated fat, such as fatty meat. Desserts and other sweets, sugary drinks, and other foods with added sugar. Full-fat dairy products. Do not salt foods before eating. Do not eat more than 4 egg yolks a week. Try to eat at least 2 vegetarian meals a week. Eat more home-cooked food and less restaurant, buffet, and fast food.  Lifestyle When eating at a restaurant, ask that your food be prepared with less salt or no salt, if possible. If you drink alcohol: Limit how much you use to: 0-1 drink a day for women who are not pregnant. 0-2 drinks a day for men. Be aware of how much alcohol is in your drink. In the [place] one drink equals one 12 oz bottle of beer (355 mL), one 5 oz glass of wine (148 mL), or one 1 oz glass of hard liquor (44 mL). General information Avoid eating more than 2,300 mg of salt a day. If you have hypertension, you may need to reduce your sodium intake to 1,500 mg a day. Work with your health care provider to maintain a healthy body weight or to lose weight. Ask what an ideal weight is for you. Get at least 30 minutes of exercise that causes your heart to beat faster (aerobic exercise) most days of the week. Activities may include walking, swimming, or biking. Work with your health care provider   or dietitian to adjust your eating plan to your individual calorie needs. What foods should I eat? Fruits All fresh, dried, or frozen fruit. Canned fruit in natural juice (without addedsugar). Vegetables Fresh or frozen vegetables (raw, steamed, roasted, or grilled). Low-sodium or reduced-sodium tomato and vegetable juice. Low-sodium or reduced-sodium tomatosauce and tomato paste. Low-sodium or reduced-sodium canned vegetables. Grains Whole-grain or  whole-wheat bread. Whole-grain or whole-wheat pasta. Brown rice. Oatmeal. Quinoa. Bulgur. Whole-grain and low-sodium cereals. Pita bread.Low-fat, low-sodium crackers. Whole-wheat flour tortillas. Meats and other proteins Skinless chicken or turkey. Ground chicken or turkey. Pork with fat trimmed off. Fish and seafood. Egg whites. Dried beans, peas, or lentils. Unsalted nuts, nut butters, and seeds. Unsalted canned beans. Lean cuts of beef with fat trimmed off. Low-sodium, lean precooked or cured meat, such as sausages or meatloaves. Dairy Low-fat (1%) or fat-free (skim) milk. Reduced-fat, low-fat, or fat-free cheeses. Nonfat, low-sodium ricotta or cottage cheese. Low-fat or nonfatyogurt. Low-fat, low-sodium cheese. Fats and oils Soft margarine without trans fats. Vegetable oil. Reduced-fat, low-fat, or light mayonnaise and salad dressings (reduced-sodium). Canola, safflower, olive, avocado, soybean, andsunflower oils. Avocado. Seasonings and condiments Herbs. Spices. Seasoning mixes without salt. Other foods Unsalted popcorn and pretzels. Fat-free sweets. The items listed above may not be a complete list of foods and beverages you can eat. Contact a dietitian for more information. What foods should I avoid? Fruits Canned fruit in a light or heavy syrup. Fried fruit. Fruit in cream or buttersauce. Vegetables Creamed or fried vegetables. Vegetables in a cheese sauce. Regular canned vegetables (not low-sodium or reduced-sodium). Regular canned tomato sauce and paste (not low-sodium or reduced-sodium). Regular tomato and vegetable juice(not low-sodium or reduced-sodium). Pickles. Olives. Grains Baked goods made with fat, such as croissants, muffins, or some breads. Drypasta or rice meal packs. Meats and other proteins Fatty cuts of meat. Ribs. Fried meat. Bacon. [name], salami, and other precooked or cured meats, such as sausages or meat loaves. Fat from the back of a pig (fatback). [name].  Salted nuts and seeds. Canned beans with added salt. Canned orsmoked fish. Whole eggs or egg yolks. Chicken or turkey with skin. Dairy Whole or 2% milk, cream, and half-and-half. Whole or full-fat cream cheese. Whole-fat or sweetened yogurt. Full-fat cheese. Nondairy creamers. Whippedtoppings. Processed cheese and cheese spreads. Fats and oils Butter. Stick margarine. Lard. Shortening. Ghee. Bacon fat. Tropical oils, suchas coconut, palm kernel, or palm oil. Seasonings and condiments Onion salt, garlic salt, seasoned salt, table salt, and sea salt. Worcestershire sauce. Tartar sauce. Barbecue sauce. Teriyaki sauce. Soy sauce, including reduced-sodium. Steak sauce. Canned and packaged gravies. Fish sauce. Oyster sauce. Cocktail sauce. Store-bought horseradish. Ketchup. Mustard. Meat flavorings and tenderizers. Bouillon cubes. Hot sauces. Pre-made or packaged marinades. Pre-made or packaged taco seasonings. Relishes. Regular saladdressings. Other foods Salted popcorn and pretzels. The items listed above may not be a complete list of foods and beverages you should avoid. Contact a dietitian for more information. Where to find more information National Heart, Lung, and Blood Institute: www.nhlbi.nih.gov American Heart Association: www.heart.org Academy of Nutrition and Dietetics: www.eatright.org National Kidney Foundation: www.kidney.org Summary The DASH eating plan is a healthy eating plan that has been shown to reduce high blood pressure (hypertension). It may also reduce your risk for type 2 diabetes, heart disease, and stroke. When on the DASH eating plan, aim to eat more fresh fruits and vegetables, whole grains, lean proteins, low-fat dairy, and heart-healthy fats. With the DASH eating plan, you should limit salt (sodium) intake to 2,300   mg a day. If you have hypertension, you may need to reduce your sodium intake to 1,500 mg a day. Work with your health care provider or dietitian to adjust  your eating plan to your individual calorie needs. This information is not intended to replace advice given to you by your health care provider. Make sure you discuss any questions you have with your healthcare provider. Document Revised: 10/28/2019 Document Reviewed: 10/28/2019 Elsevier Patient Education  2022 [employer]  

## 2021-06-04 NOTE — Progress Notes (Signed)
Subjective:   By signing my name below, I, Cindy Velasquez, attest that this documentation has been prepared under the direction and in the presence of Dr. Roma Schanz, DO. 06/04/2021     Patient ID: Cindy Velasquez, female    DOB: 11/13/1941, 80 y.o.   MRN: [id number]  Chief Complaint  Patient presents with   Hypertension   Hyperlipidemia   Follow-up    HPI Patient is in today for a office visit. She reports that she recently cut her hand on a metal can while cleaning it. She is requesting a tetanus vaccine to prevent symptoms. She is due for a tetanus vaccine in 2024.  She complains having constant tingling and numbness in the tips of all of her fingers. 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 The numbness does not interfere with her daily life at this time. Her blood pressure is well controlled at this time. She is requesting a refill on 20 mg lisinopril daily PO. She continues taking 20 mg lisinopril daily PO and 25 mg hydrochlorothiazide daily PO and reports no new issues while taking them. She denies having any swelling in the ankles at this time. BP Readings from Last 3 Encounters:  06/04/21 120/60  02/21/21 134/82  10/04/20 136/68   She continues taking 20 mg rosuvastatin daily PO and reports no new issues while taking it.  Lab Results  Component Value Date   CHOL 142 06/04/2021   HDL 40.20 06/04/2021   LDLCALC 68 06/04/2021   LDLDIRECT 129.1 08/23/2007   TRIG 170.0 (H) 06/04/2021   CHOLHDL 4 06/04/2021   She has 3 pfizer Covid-19 vaccines at this time and is planning to get the 4th at a later time. She recently completed the 2nd shingles vaccine at her pharmacy. She is due for a mammogram and is requesting for an appointment.   Past Medical History:  Diagnosis Date   Adenomatous colon polyp    Cancer ([place]) 03/2015   BCC R tibia    Diverticulosis of colon    GERD (gastroesophageal reflux disease)    Hip pain    Hyperlipidemia    Hyperplastic colon polyp     Hypertension    Lactose intolerance    Loose stools    Lower back pain    Osteoporosis    Palpitations     Past Surgical History:  Procedure Laterality Date   EYE SURGERY  2012   B/L 01/2011  02/2011   HAMMER TOE SURGERY     Bilateral   ROTATOR CUFF REPAIR     Left   TUBAL LIGATION      Family History  Problem Relation Age of Onset   Hypertension Mother    Cancer Mother 82       ?   Hyperlipidemia Mother    Obesity Mother    Hypertension Father    Stroke Father    Heart disease Father    Hypertension Brother    Testicular cancer Brother 67   Hypertension Brother    Heart disease Brother        quadruple bypass   Colon cancer Neg Hx    Stomach cancer Neg Hx    Throat cancer Neg Hx     Social History   Socioeconomic History   Marital status: Married    Spouse name: Tinsleigh Slovacek   Number of children: 4   Years of education: Not on file   Highest education level: Not on file  Occupational History  Occupation: retired--[employer]  Tobacco Use   Smoking status: Former    Packs/day: 0.50    Years: 30.00    Pack years: 15.00    Types: Cigarettes    Quit date: 06/02/1991    Years since quitting: 30.0   Smokeless tobacco: Never  Vaping Use   Vaping Use: Never used  Substance and Sexual Activity   Alcohol use: Yes    Alcohol/week: 5.0 standard drinks    Types: 5 Glasses of wine per week   Drug use: No   Sexual activity: Not Currently    Partners: Male  Other Topics Concern   Not on file  Social History Narrative   Exercise--  [employer]--yoga and cardio 3x a week   Social Determinants of Health   Financial Resource Strain: Not on file  Food Insecurity: Not on file  Transportation Needs: Not on file  Physical Activity: Not on file  Stress: Not on file  Social Connections: Not on file  Intimate Partner Violence: Not on file    Outpatient Medications Prior to Visit  Medication Sig Dispense Refill   Cholecalciferol (VITAMIN D3)  125 MCG (5000 UT) CAPS Take 1 capsule (5,000 Units total) by mouth daily. 30 capsule 0   clotrimazole-betamethasone (LOTRISONE) cream Apply 1 application topically 2 (two) times daily. Use as needed for rash 30 g 1   fluticasone (FLONASE) 50 MCG/ACT nasal spray Place 2 sprays into both nostrils daily. 16 g 6   hydrochlorothiazide (HYDRODIURIL) 25 MG tablet TAKE 1/2 TABLET(12.5 MG) BY MOUTH EVERY MORNING 45 tablet 1   MELATONIN ER PO Take by mouth.     Multiple Vitamins-Minerals (ICAPS AREDS 2) CAPS Take 2 capsules by mouth daily.      rosuvastatin (CRESTOR) 20 MG tablet TAKE 1 TABLET BY MOUTH EVERY DAY 90 tablet 1   lisinopril (ZESTRIL) 20 MG tablet Take 1 tablet (20 mg total) by mouth daily. 90 tablet 1   amoxicillin (AMOXIL) 875 MG tablet TAKE 1 TABLET (875 MG TOTAL) BY MOUTH 2 (TWO) TIMES DAILY. (Patient not taking: No sig reported) 20 tablet 0   COVID-19 mRNA vaccine, Pfizer, 30 MCG/0.3ML injection INJECT AS DIRECTED (Patient not taking: Reported on 06/04/2021) .3 mL 0   loratadine (CLARITIN) 10 MG tablet Take 1 tablet (10 mg total) by mouth daily. (Patient not taking: Reported on 06/04/2021) 30 tablet 11   predniSONE (DELTASONE) 10 MG tablet TAKE 3 TABLETS PO QD FOR 3 DAYS THEN TAKE 2 TABLETS PO QD FOR 3 DAYS THEN TAKE 1 TABLET PO QD FOR 3 DAYS THEN TAKE 1/2 TAB PO QD FOR 3 DAYS (Patient not taking: Reported on 06/04/2021) 20 tablet 0   No facility-administered medications prior to visit.   Health Maintenance  Topic Date Due   Zoster Vaccines- Shingrix (1 of 2) Never done   MAMMOGRAM  09/19/2020   COVID-19 Vaccine (4 - Booster for [employer] series) 01/29/2021   INFLUENZA VACCINE  07/08/2021   TETANUS/TDAP  06/05/2031   DEXA SCAN  Completed   Hepatitis C Screening  Completed   PNA vac Low Risk Adult  Completed   HPV VACCINES  Aged Out    No Known Allergies  Review of Systems  Constitutional:  Negative for fever and malaise/fatigue.  HENT:  Negative for congestion.   Eyes:  Negative for  blurred vision.  Respiratory:  Negative for shortness of breath.   Cardiovascular:  Negative for chest pain, palpitations and leg swelling.  Gastrointestinal:  Negative for abdominal pain, blood  in stool and nausea.  Genitourinary:  Negative for dysuria and frequency.  Musculoskeletal:  Negative for falls.  Skin:  Negative for rash.  Neurological:  Positive for tingling (Tips of her fingers on both hands). Negative for dizziness, loss of consciousness and headaches.  Endo/Heme/Allergies:  Negative for environmental allergies.  Psychiatric/Behavioral:  Negative for depression. The patient is not nervous/anxious.       Objective:    Physical Exam Vitals and nursing note reviewed.  Constitutional:      General: She is not in acute distress.    Appearance: Normal appearance. She is not ill-appearing.  HENT:     Head: Normocephalic and atraumatic.     Right Ear: External ear normal.     Left Ear: External ear normal.  Eyes:     Extraocular Movements: Extraocular movements intact.     Pupils: Pupils are equal, round, and reactive to light.  Cardiovascular:     Rate and Rhythm: Normal rate and regular rhythm.     Pulses: Normal pulses.     Heart sounds: Normal heart sounds. No murmur heard.   No gallop.  Pulmonary:     Effort: Pulmonary effort is normal. No respiratory distress.     Breath sounds: Normal breath sounds. No wheezing, rhonchi or rales.  Skin:    General: Skin is warm and dry.  Neurological:     Mental Status: She is alert and oriented to person, place, and time.     Sensory: Sensory deficit present.     Comments: Numbness in tips of fingers   Psychiatric:        Behavior: Behavior normal.    BP 120/60 (BP Location: Right Arm, Patient Position: Sitting, Cuff Size: Normal)   Pulse 83   Temp 98.7 F (37.1 C) (Oral)   Resp 18   Ht 4\' 11"  (1.499 m)   Wt 163 lb 6.4 oz (74.1 kg)   SpO2 94%   BMI 33.00 kg/m  Wt Readings from Last 3 Encounters:  06/04/21 163 lb  6.4 oz (74.1 kg)  02/21/21 160 lb 12.8 oz (72.9 kg)  10/04/20 158 lb 3.2 oz (71.8 kg)    Diabetic Foot Exam - Simple   No data filed    Lab Results  Component Value Date   WBC 7.1 09/24/2020   HGB 14.5 09/24/2020   HCT 44.1 09/24/2020   PLT 275 09/24/2020   GLUCOSE 79 06/04/2021   CHOL 142 06/04/2021   TRIG 170.0 (H) 06/04/2021   HDL 40.20 06/04/2021   LDLDIRECT 129.1 08/23/2007   LDLCALC 68 06/04/2021   ALT 28 06/04/2021   AST 24 06/04/2021   NA 139 06/04/2021   K 4.4 06/04/2021   CL 100 06/04/2021   CREATININE 0.69 06/04/2021   BUN 18 06/04/2021   CO2 33 (H) 06/04/2021   TSH 1.29 09/15/2019   INR 1.0 07/29/2020   HGBA1C 5.9 (H) 08/02/2020    Lab Results  Component Value Date   TSH 1.29 09/15/2019   Lab Results  Component Value Date   WBC 7.1 09/24/2020   HGB 14.5 09/24/2020   HCT 44.1 09/24/2020   MCV 89.3 09/24/2020   PLT 275 09/24/2020   Lab Results  Component Value Date   NA 139 06/04/2021   K 4.4 06/04/2021   CO2 33 (H) 06/04/2021   GLUCOSE 79 06/04/2021   BUN 18 06/04/2021   CREATININE 0.69 06/04/2021   BILITOT 0.5 06/04/2021   ALKPHOS 86 06/04/2021   AST 24 06/04/2021  ALT 28 06/04/2021   PROT 7.2 06/04/2021   ALBUMIN 4.1 06/04/2021   CALCIUM 10.0 06/04/2021   ANIONGAP 9 07/29/2020   GFR 82.29 06/04/2021   Lab Results  Component Value Date   CHOL 142 06/04/2021   Lab Results  Component Value Date   HDL 40.20 06/04/2021   Lab Results  Component Value Date   LDLCALC 68 06/04/2021   Lab Results  Component Value Date   TRIG 170.0 (H) 06/04/2021   Lab Results  Component Value Date   CHOLHDL 4 06/04/2021   Lab Results  Component Value Date   HGBA1C 5.9 (H) 08/02/2020       Assessment & Plan:   Problem List Items Addressed This Visit       Unprioritized   Hyperlipidemia   Relevant Medications   lisinopril (ZESTRIL) 20 MG tablet   Other Relevant Orders   Lipid panel (Completed)   Comprehensive metabolic panel  (Completed)   Dyslipidemia    Encourage heart healthy diet such as MIND or DASH diet, increase exercise, avoid trans fats, simple carbohydrates and processed foods, consider a krill or fish or flaxseed oil cap daily.        Essential hypertension    Well controlled, no changes to meds. Encouraged heart healthy diet such as the DASH diet and exercise as tolerated.        Relevant Medications   lisinopril (ZESTRIL) 20 MG tablet   Hand abrasion, infected, left, initial encounter    Td given to pt        Relevant Orders   Td vaccine greater than or equal to 7yo preservative free IM (Completed)   Idiopathic peripheral neuropathy    In hands Mild symptoms Not new Pt does not want to do anything at this time -- she will call if symptoms worsen        Other Visit Diagnoses     Primary hypertension    -  Primary   Relevant Medications   lisinopril (ZESTRIL) 20 MG tablet   Other Relevant Orders   Lipid panel (Completed)   Comprehensive metabolic panel (Completed)        Meds ordered this encounter  Medications   lisinopril (ZESTRIL) 20 MG tablet    Sig: Take 1 tablet (20 mg total) by mouth daily.    Dispense:  90 tablet    Refill:  1    I, Dr. Roma Schanz, DO, personally preformed the services described in this documentation.  All medical record entries made by the scribe were at my direction and in my presence.  I have reviewed the chart and discharge instructions (if applicable) and agree that the record reflects my personal performance and is accurate and complete. 06/04/2021   I,Cindy Velasquez,acting as a scribe for [employer], DO.,have documented all relevant documentation on the behalf of Ann Held, DO,as directed by  Ann Held, DO while in the presence of Ann Held, DO.   Ann Held, DO

## 2021-06-05 ENCOUNTER — Encounter: Payer: Self-pay | Admitting: Family Medicine

## 2021-06-05 DIAGNOSIS — L089 Local infection of the skin and subcutaneous tissue, unspecified: Secondary | ICD-10-CM | POA: Insufficient documentation

## 2021-06-05 DIAGNOSIS — S60512A Abrasion of left hand, initial encounter: Secondary | ICD-10-CM | POA: Insufficient documentation

## 2021-06-05 NOTE — Assessment & Plan Note (Signed)
Well controlled, no changes to meds. Encouraged heart healthy diet such as the DASH diet and exercise as tolerated.  °

## 2021-06-05 NOTE — Assessment & Plan Note (Signed)
Td given to pt

## 2021-06-05 NOTE — Assessment & Plan Note (Signed)
Encourage heart healthy diet such as MIND or DASH diet, increase exercise, avoid trans fats, simple carbohydrates and processed foods, consider a krill or fish or flaxseed oil cap daily.  °

## 2021-06-05 NOTE — Assessment & Plan Note (Signed)
In hands Mild symptoms Not new Pt does not want to do anything at this time -- she will call if symptoms worsen

## 2021-07-08 ENCOUNTER — Emergency Department (HOSPITAL_BASED_OUTPATIENT_CLINIC_OR_DEPARTMENT_OTHER): Payer: Medicare HMO

## 2021-07-08 ENCOUNTER — Other Ambulatory Visit: Payer: Self-pay

## 2021-07-08 ENCOUNTER — Emergency Department (HOSPITAL_BASED_OUTPATIENT_CLINIC_OR_DEPARTMENT_OTHER)
Admission: EM | Admit: 2021-07-08 | Discharge: 2021-07-08 | Disposition: A | Discharge status: Home / Self Care | Payer: Medicare HMO | Attending: Emergency Medicine | Admitting: Emergency Medicine

## 2021-07-08 ENCOUNTER — Encounter (HOSPITAL_BASED_OUTPATIENT_CLINIC_OR_DEPARTMENT_OTHER): Payer: Self-pay

## 2021-07-08 DIAGNOSIS — J029 Acute pharyngitis, unspecified: Secondary | ICD-10-CM | POA: Insufficient documentation

## 2021-07-08 DIAGNOSIS — I10 Essential (primary) hypertension: Secondary | ICD-10-CM | POA: Diagnosis not present

## 2021-07-08 DIAGNOSIS — Z87891 Personal history of nicotine dependence: Secondary | ICD-10-CM | POA: Diagnosis not present

## 2021-07-08 DIAGNOSIS — R059 Cough, unspecified: Secondary | ICD-10-CM | POA: Insufficient documentation

## 2021-07-08 DIAGNOSIS — Z79899 Other long term (current) drug therapy: Secondary | ICD-10-CM | POA: Diagnosis not present

## 2021-07-08 DIAGNOSIS — Z20822 Contact with and (suspected) exposure to covid-19: Secondary | ICD-10-CM | POA: Insufficient documentation

## 2021-07-08 DIAGNOSIS — R0602 Shortness of breath: Secondary | ICD-10-CM | POA: Insufficient documentation

## 2021-07-08 DIAGNOSIS — Z85828 Personal history of other malignant neoplasm of skin: Secondary | ICD-10-CM | POA: Diagnosis not present

## 2021-07-08 LAB — CBC WITH DIFFERENTIAL/PLATELET
Abs Immature Granulocytes: 0.02 10*3/uL (ref 0.00–0.07)
Basophils Absolute: 0 10*3/uL (ref 0.0–0.1)
Basophils Relative: 1 %
Eosinophils Absolute: 0.3 10*3/uL (ref 0.0–0.5)
Eosinophils Relative: 3 %
HCT: 41.8 % (ref 36.0–46.0)
Hemoglobin: 13.7 g/dL (ref 12.0–15.0)
Immature Granulocytes: 0 %
Lymphocytes Relative: 18 %
Lymphs Abs: 1.4 10*3/uL (ref 0.7–4.0)
MCH: 29.9 pg (ref 26.0–34.0)
MCHC: 32.8 g/dL (ref 30.0–36.0)
MCV: 91.3 fL (ref 80.0–100.0)
Monocytes Absolute: 0.6 10*3/uL (ref 0.1–1.0)
Monocytes Relative: 8 %
Neutro Abs: 5.4 10*3/uL (ref 1.7–7.7)
Neutrophils Relative %: 70 %
Platelets: 261 10*3/uL (ref 150–400)
RBC: 4.58 MIL/uL (ref 3.87–5.11)
RDW: 14.3 % (ref 11.5–15.5)
WBC: 7.7 10*3/uL (ref 4.0–10.5)
nRBC: 0 % (ref 0.0–0.2)

## 2021-07-08 LAB — BASIC METABOLIC PANEL
Anion gap: 6 (ref 5–15)
BUN: 21 mg/dL (ref 8–23)
CO2: 34 mmol/L — ABNORMAL HIGH (ref 22–32)
Calcium: 10 mg/dL (ref 8.9–10.3)
Chloride: 101 mmol/L (ref 98–111)
Creatinine, Ser: 0.74 mg/dL (ref 0.44–1.00)
GFR, Estimated: >60 mL/min (ref >60)
Glucose, Bld: 160 mg/dL — ABNORMAL HIGH (ref 70–99)
Potassium: 3.9 mmol/L (ref 3.5–5.1)
Sodium: 141 mmol/L (ref 135–145)

## 2021-07-08 LAB — RESP PANEL BY RT-PCR (FLU A&B, COVID) ARPGX2
Influenza A by PCR: NEGATIVE
Influenza B by PCR: NEGATIVE
SARS Coronavirus 2 by RT PCR: NEGATIVE

## 2021-07-08 NOTE — ED Provider Notes (Signed)
[facility] EMERGENCY DEPARTMENT Provider Note   CSN: [id number] Arrival date & time: 07/08/21  1012     History Chief Complaint  Patient presents with   Shortness of Breath    Cindy Velasquez is a 80 y.o. female.  Pt presents to the ED today with sob and cough and sore throat.  Pt's husband tested positive for Covid on 7/29.  Pt has taken a home Covid test, but it was negative.  Pt has been vaccinated.      Past Medical History:  Diagnosis Date   Adenomatous colon polyp    Cancer ([place]) 03/2015   BCC R tibia    Diverticulosis of colon    GERD (gastroesophageal reflux disease)    Hip pain    Hyperlipidemia    Hyperplastic colon polyp    Hypertension    Lactose intolerance    Loose stools    Lower back pain    Osteoporosis    Palpitations     Patient Active Problem List   Diagnosis Date Noted   Hand abrasion, infected, left, initial encounter 06/05/2021   Thoracic stenosis 07/27/2020   Dyslipidemia 07/27/2020   Prediabetes 12/13/2018   Vitamin D deficiency 12/13/2018   Overweight (BMI 25.0-29.9) 12/13/2018   Preventative health care 09/15/2018   Bilateral cold feet 06/17/2018   Idiopathic peripheral neuropathy 06/17/2018   Lower extremity edema 06/17/2018   Right shoulder pain 08/20/2016   Knee pain, bilateral 04/23/2015   Urticaria 10/13/2014   Neuropathy 10/13/2014   Fatigue 10/13/2014   Abrasion of face 08/01/2013   Rib pain on left side 07/25/2013   Tinea cruris 06/22/2012   Eustachian tube dysfunction 04/07/2012   Cerumen impaction 04/07/2012   URI 10/30/2010   CHANGE IN BOWELS 04/03/2010   FECAL OCCULT BLOOD 04/03/2010   PERSONAL HX COLONIC POLYPS 04/03/2010   DYSPEPSIA 03/04/2010   GUAIAC POSITIVE STOOL 03/04/2010   HEMATURIA UNSPECIFIED 06/28/2009   DYSURIA 06/28/2009   HAMMER TOE 12/28/2008   POSTMENOPAUSAL STATUS 12/28/2008   BACK PAIN, LUMBAR 07/11/2008   BENIGN NEOPLASM OF SKIN SITE UNSPECIFIED 02/28/2008   Hyperlipidemia  04/15/2007   Essential hypertension 04/15/2007   DIVERTICULOSIS, COLON 04/15/2007   THUMB PAIN, RIGHT 04/15/2007   OSTEOPOROSIS 04/15/2007    Past Surgical History:  Procedure Laterality Date   EYE SURGERY  2012   B/L 01/2011  02/2011   HAMMER TOE SURGERY     Bilateral   ROTATOR CUFF REPAIR     Left   TUBAL LIGATION       OB History     Gravida  4   Para      Term      Preterm      AB      Living  4      SAB      IAB      Ectopic      Multiple      Live Births              Family History  Problem Relation Age of Onset   Hypertension Mother    Cancer Mother 36       ?   Hyperlipidemia Mother    Obesity Mother    Hypertension Father    Stroke Father    Heart disease Father    Hypertension Brother    Testicular cancer Brother 46   Hypertension Brother    Heart disease Brother        quadruple bypass  Colon cancer Neg Hx    Stomach cancer Neg Hx    Throat cancer Neg Hx     Social History   Tobacco Use   Smoking status: Former    Packs/day: 0.50    Years: 30.00    Pack years: 15.00    Types: Cigarettes    Quit date: 06/02/1991    Years since quitting: 30.1   Smokeless tobacco: Never  Vaping Use   Vaping Use: Never used  Substance Use Topics   Alcohol use: Yes    Alcohol/week: 5.0 standard drinks    Types: 5 Glasses of wine per week   Drug use: No    Home Medications Prior to Admission medications   Medication Sig Start Date End Date Taking? Authorizing Provider  Cholecalciferol (VITAMIN D3) 125 MCG (5000 UT) CAPS Take 1 capsule (5,000 Units total) by mouth daily. 04/05/19   Abby Potash, PA-C  clotrimazole-betamethasone (LOTRISONE) cream Apply 1 application topically 2 (two) times daily. Use as needed for rash 12/11/20   Carollee Herter, Kendrick Fries R, DO  fluticasone [facility]) 50 MCG/ACT nasal spray Place 2 sprays into both nostrils daily. 02/21/21   Roma Schanz R, DO  hydrochlorothiazide (HYDRODIURIL) 25 MG tablet TAKE 1/2  TABLET(12.5 MG) BY MOUTH EVERY MORNING 08/04/20   Carollee Herter, Alferd Apa, DO  lisinopril (ZESTRIL) 20 MG tablet Take 1 tablet (20 mg total) by mouth daily. 06/04/21   Ann Held, DO  MELATONIN ER PO Take by mouth.    [provider]  Multiple Vitamins-Minerals (ICAPS AREDS 2) CAPS Take 2 capsules by mouth daily.     [provider]  rosuvastatin (CRESTOR) 20 MG tablet TAKE 1 TABLET BY MOUTH EVERY DAY 07/04/20   Ann Held, DO    Allergies    Patient has no known allergies.  Review of Systems   Review of Systems  HENT:  Positive for sore throat.   Respiratory:  Positive for cough and shortness of breath.   All other systems reviewed and are negative.  Physical Exam Updated Vital Signs BP 119/67   Pulse 73   Temp 97.9 F (36.6 C) (Oral)   Resp 15   Ht '4\' 11"'$  (1.499 m)   Wt 70.3 kg   SpO2 95%   BMI 31.31 kg/m   Physical Exam Vitals and nursing note reviewed.  Constitutional:      Appearance: She is well-developed.  HENT:     Head: Normocephalic and atraumatic.     Mouth/Throat:     Mouth: Mucous membranes are moist.     Pharynx: Oropharynx is clear.  Eyes:     Extraocular Movements: Extraocular movements intact.     Pupils: Pupils are equal, round, and reactive to light.  Cardiovascular:     Rate and Rhythm: Normal rate and regular rhythm.  Pulmonary:     Effort: Pulmonary effort is normal.     Breath sounds: Normal breath sounds.  Abdominal:     General: Bowel sounds are normal.     Palpations: Abdomen is soft.  Musculoskeletal:        General: Normal range of motion.     Cervical back: Normal range of motion and neck supple.  Skin:    General: Skin is warm.     Capillary Refill: Capillary refill takes less than 2 seconds.  Neurological:     General: No focal deficit present.     Mental Status: She is alert and oriented to person, place, and  time.  Psychiatric:        Mood and Affect: Mood normal.        Behavior: Behavior  normal.    ED Results / Procedures / Treatments   Labs (all labs ordered are listed, but only abnormal results are displayed) Labs Reviewed  BASIC METABOLIC PANEL - Abnormal; Notable for the following components:      Result Value   CO2 34 (*)    Glucose, Bld 160 (*)    All other components within normal limits  RESP PANEL BY RT-PCR (FLU A&B, COVID) ARPGX2  CBC WITH DIFFERENTIAL/PLATELET    EKG EKG Interpretation  Date/Time:  Monday July 08 2021 10:28:58 EDT Ventricular Rate:  86 PR Interval:  136 QRS Duration: 88 QT Interval:  374 QTC Calculation: 448 R Axis:   70 Text Interpretation: Sinus rhythm No significant change since last tracing Confirmed by Isla Pence [phone]) on 07/08/2021 11:33:11 AM  Radiology DG Chest Portable 1 View  Result Date: 07/08/2021 CLINICAL DATA:  Shortness of breath. EXAM: PORTABLE CHEST 1 VIEW COMPARISON:  02/25/2016 FINDINGS: 1040 hours. The lungs are clear without focal pneumonia, edema, pneumothorax or pleural effusion. The cardiopericardial silhouette is within normal limits for size. Bones are diffusely demineralized. Telemetry leads overlie the chest. IMPRESSION: No acute cardiopulmonary findings. Electronically Signed   By: Misty Stanley M.D.   On: 07/08/2021 11:29    Procedures Procedures   Medications Ordered in ED Medications - No data to display  ED Course  I have reviewed the triage vital signs and the nursing notes.  Pertinent labs & imaging results that were available during my care of the patient were reviewed by me and considered in my medical decision making (see chart for details).    MDM Rules/Calculators/A&P                           Pt is negative for Covid.  Therefore, I am not going to rx Paxlovid.  Pt's sx are very minor.  She is stable for d/c.  Return if worse. Final Clinical Impression(s) / ED Diagnoses Final diagnoses:  Exposure to COVID-19 virus    Rx / DC Orders ED Discharge Orders     None         Isla Pence, MD 07/08/21 1152

## 2021-07-08 NOTE — ED Triage Notes (Signed)
Pt c/o sob, sore throat, cough, fatigue, diarrhea since yesterday. States husband tested positive for Covid Friday.

## 2021-07-11 DIAGNOSIS — R059 Cough, unspecified: Secondary | ICD-10-CM | POA: Diagnosis not present

## 2021-07-11 DIAGNOSIS — U071 COVID-19: Secondary | ICD-10-CM | POA: Diagnosis not present

## 2021-07-11 DIAGNOSIS — I10 Essential (primary) hypertension: Secondary | ICD-10-CM | POA: Diagnosis not present

## 2021-07-30 ENCOUNTER — Ambulatory Visit (HOSPITAL_BASED_OUTPATIENT_CLINIC_OR_DEPARTMENT_OTHER): Payer: Medicare HMO

## 2021-07-31 ENCOUNTER — Other Ambulatory Visit (HOSPITAL_BASED_OUTPATIENT_CLINIC_OR_DEPARTMENT_OTHER): Payer: Self-pay

## 2021-07-31 ENCOUNTER — Telehealth: Payer: Self-pay | Admitting: Family Medicine

## 2021-07-31 DIAGNOSIS — J069 Acute upper respiratory infection, unspecified: Secondary | ICD-10-CM

## 2021-07-31 MED ORDER — FLUTICASONE PROPIONATE 50 MCG/ACT NA SUSP
2.0000 | Freq: Every day | NASAL | 6 refills | Status: DC
  Filled 2021-07-31: qty 16, 30d supply, fill #0

## 2021-07-31 NOTE — Telephone Encounter (Signed)
Refill sent.

## 2021-07-31 NOTE — Telephone Encounter (Signed)
Medication: fluticasone (FLONASE) 50 MCG/ACT nasal spray   Has the patient contacted their pharmacy? Yes.   (If no, request that the patient contact the pharmacy for the refill.) (If yes, when and what did the pharmacy advise?)  Preferred Pharmacy (with phone number or street name): [facility]   Agent: Please be advised that RX refills may take up to 3 business days. We ask that you follow-up with your pharmacy.

## 2021-08-03 ENCOUNTER — Other Ambulatory Visit: Payer: Self-pay | Admitting: Family Medicine

## 2021-08-20 ENCOUNTER — Ambulatory Visit (HOSPITAL_BASED_OUTPATIENT_CLINIC_OR_DEPARTMENT_OTHER): Payer: Medicare HMO

## 2021-08-21 ENCOUNTER — Other Ambulatory Visit: Payer: Self-pay | Admitting: Family Medicine

## 2021-08-21 DIAGNOSIS — I10 Essential (primary) hypertension: Secondary | ICD-10-CM

## 2021-09-10 ENCOUNTER — Ambulatory Visit (HOSPITAL_BASED_OUTPATIENT_CLINIC_OR_DEPARTMENT_OTHER): Payer: Medicare HMO

## 2021-09-10 ENCOUNTER — Encounter (HOSPITAL_BASED_OUTPATIENT_CLINIC_OR_DEPARTMENT_OTHER): Payer: Self-pay

## 2021-09-10 ENCOUNTER — Telehealth: Payer: Self-pay | Admitting: Family Medicine

## 2021-09-10 ENCOUNTER — Other Ambulatory Visit: Payer: Self-pay

## 2021-09-10 ENCOUNTER — Other Ambulatory Visit: Payer: Self-pay | Admitting: Family Medicine

## 2021-09-10 ENCOUNTER — Ambulatory Visit (HOSPITAL_BASED_OUTPATIENT_CLINIC_OR_DEPARTMENT_OTHER)
Admission: RE | Admit: 2021-09-10 | Discharge: 2021-09-10 | Disposition: A | Discharge status: Home / Self Care | Payer: Medicare HMO | Source: Ambulatory Visit | Attending: Family Medicine | Admitting: Family Medicine

## 2021-09-10 DIAGNOSIS — R200 Anesthesia of skin: Secondary | ICD-10-CM

## 2021-09-10 DIAGNOSIS — Z1231 Encounter for screening mammogram for malignant neoplasm of breast: Secondary | ICD-10-CM

## 2021-09-10 NOTE — Telephone Encounter (Signed)
Pt came in office stating has seen by provider recently and that spoke with provider about needing a referral to a neurologist, provider mentioned that when pt was ready to let her know so provider can put in referral. Pt states is ready to be referred to any neurologist that provider suggest. Please advise. Pt tel [phone].

## 2021-09-11 ENCOUNTER — Encounter: Payer: Self-pay | Admitting: Neurology

## 2021-09-18 ENCOUNTER — Telehealth: Payer: Self-pay | Admitting: Family Medicine

## 2021-09-18 DIAGNOSIS — Z961 Presence of intraocular lens: Secondary | ICD-10-CM | POA: Diagnosis not present

## 2021-09-18 DIAGNOSIS — H43812 Vitreous degeneration, left eye: Secondary | ICD-10-CM | POA: Diagnosis not present

## 2021-09-18 DIAGNOSIS — H04223 Epiphora due to insufficient drainage, bilateral lacrimal glands: Secondary | ICD-10-CM | POA: Diagnosis not present

## 2021-09-18 DIAGNOSIS — H10413 Chronic giant papillary conjunctivitis, bilateral: Secondary | ICD-10-CM | POA: Diagnosis not present

## 2021-09-18 DIAGNOSIS — H353132 Nonexudative age-related macular degeneration, bilateral, intermediate dry stage: Secondary | ICD-10-CM | POA: Diagnosis not present

## 2021-09-18 DIAGNOSIS — H0102A Squamous blepharitis right eye, upper and lower eyelids: Secondary | ICD-10-CM | POA: Diagnosis not present

## 2021-09-18 DIAGNOSIS — H0102B Squamous blepharitis left eye, upper and lower eyelids: Secondary | ICD-10-CM | POA: Diagnosis not present

## 2021-09-18 NOTE — Telephone Encounter (Signed)
Left message for patient to call back and schedule Medicare Annual Wellness Visit (AWV) in office.   If not able to come in office, please offer to do virtually or by telephone.  Left office number and my jabber [phone].  Last AWV:09/15/2019  Please schedule at anytime with Nurse Health Advisor.

## 2021-09-23 ENCOUNTER — Other Ambulatory Visit (HOSPITAL_BASED_OUTPATIENT_CLINIC_OR_DEPARTMENT_OTHER): Payer: Self-pay

## 2021-09-23 ENCOUNTER — Ambulatory Visit: Payer: Medicare HMO | Attending: Internal Medicine

## 2021-09-23 DIAGNOSIS — Z23 Encounter for immunization: Secondary | ICD-10-CM

## 2021-09-23 MED ORDER — INFLUENZA VAC A&B SA ADJ QUAD 0.5 ML IM PRSY
PREFILLED_SYRINGE | INTRAMUSCULAR | 0 refills | Status: DC
  Filled 2021-09-23: qty 0.5, 1d supply, fill #0

## 2021-09-29 ENCOUNTER — Encounter (HOSPITAL_BASED_OUTPATIENT_CLINIC_OR_DEPARTMENT_OTHER): Payer: Self-pay | Admitting: *Deleted

## 2021-09-29 ENCOUNTER — Other Ambulatory Visit: Payer: Self-pay

## 2021-09-29 ENCOUNTER — Emergency Department (HOSPITAL_BASED_OUTPATIENT_CLINIC_OR_DEPARTMENT_OTHER)
Admission: EM | Admit: 2021-09-29 | Discharge: 2021-09-30 | Disposition: A | Discharge status: Home / Self Care | Payer: Medicare HMO | Attending: Emergency Medicine | Admitting: Emergency Medicine

## 2021-09-29 DIAGNOSIS — I10 Essential (primary) hypertension: Secondary | ICD-10-CM | POA: Diagnosis not present

## 2021-09-29 DIAGNOSIS — S40211A Abrasion of right shoulder, initial encounter: Secondary | ICD-10-CM | POA: Diagnosis not present

## 2021-09-29 DIAGNOSIS — S80811A Abrasion, right lower leg, initial encounter: Secondary | ICD-10-CM | POA: Insufficient documentation

## 2021-09-29 DIAGNOSIS — Z87891 Personal history of nicotine dependence: Secondary | ICD-10-CM | POA: Diagnosis not present

## 2021-09-29 DIAGNOSIS — W228XXA Striking against or struck by other objects, initial encounter: Secondary | ICD-10-CM | POA: Diagnosis not present

## 2021-09-29 DIAGNOSIS — S81801A Unspecified open wound, right lower leg, initial encounter: Secondary | ICD-10-CM | POA: Diagnosis not present

## 2021-09-29 DIAGNOSIS — Z86018 Personal history of other benign neoplasm: Secondary | ICD-10-CM | POA: Insufficient documentation

## 2021-09-29 MED ORDER — CEPHALEXIN 500 MG PO CAPS
500.0000 mg | ORAL_CAPSULE | Freq: Three times a day (TID) | ORAL | 0 refills | Status: DC
  Filled 2021-09-30: qty 15, 5d supply, fill #0

## 2021-09-29 MED ORDER — CEPHALEXIN 250 MG PO CAPS
500.0000 mg | ORAL_CAPSULE | Freq: Once | ORAL | Status: AC
  Administered 2021-09-29: 500 mg via ORAL
  Filled 2021-09-29: qty 2

## 2021-09-29 NOTE — ED Provider Notes (Signed)
[facility] EMERGENCY DEPARTMENT Provider Note   CSN: [id number] Arrival date & time: 09/29/21  1920     History Chief Complaint  Patient presents with   Extremity Laceration    Cindy Velasquez is a 80 y.o. female.  HPI Patient is an 80 year old female with past medical history detailed below presented to ER today with small abrasion to her right shoulder she states this is occurred 2 days ago she thinks it had occurred when she was pulling her pants off and her jeans zipper scraped her leg.  She states that she has had some issues with bleeding since this occurred.  She states that today she took off the bandage and her blood and this prompted her to come to the emergency room.  She states that it is no longer bleeding.  She believes that she is up-to-date on her tetanus.  Is not experiencing any pain.  No lightheadedness or dizziness.  Denies any major blood loss.  No other associate symptoms.  Aggravating factors apart from area is somewhat painful with pushing on it.    Past Medical History:  Diagnosis Date   Adenomatous colon polyp    Cancer ([place]) 03/2015   BCC R tibia    Diverticulosis of colon    GERD (gastroesophageal reflux disease)    Hip pain    Hyperlipidemia    Hyperplastic colon polyp    Hypertension    Lactose intolerance    Loose stools    Lower back pain    Osteoporosis    Palpitations     Patient Active Problem List   Diagnosis Date Noted   Hand abrasion, infected, left, initial encounter 06/05/2021   Thoracic stenosis 07/27/2020   Dyslipidemia 07/27/2020   Prediabetes 12/13/2018   Vitamin D deficiency 12/13/2018   Overweight (BMI 25.0-29.9) 12/13/2018   Preventative health care 09/15/2018   Bilateral cold feet 06/17/2018   Idiopathic peripheral neuropathy 06/17/2018   Lower extremity edema 06/17/2018   Right shoulder pain 08/20/2016   Knee pain, bilateral 04/23/2015   Urticaria 10/13/2014   Neuropathy 10/13/2014   Fatigue  10/13/2014   Abrasion of face 08/01/2013   Rib pain on left side 07/25/2013   Tinea cruris 06/22/2012   Eustachian tube dysfunction 04/07/2012   Cerumen impaction 04/07/2012   URI 10/30/2010   CHANGE IN BOWELS 04/03/2010   FECAL OCCULT BLOOD 04/03/2010   PERSONAL HX COLONIC POLYPS 04/03/2010   DYSPEPSIA 03/04/2010   GUAIAC POSITIVE STOOL 03/04/2010   HEMATURIA UNSPECIFIED 06/28/2009   DYSURIA 06/28/2009   HAMMER TOE 12/28/2008   POSTMENOPAUSAL STATUS 12/28/2008   BACK PAIN, LUMBAR 07/11/2008   BENIGN NEOPLASM OF SKIN SITE UNSPECIFIED 02/28/2008   Hyperlipidemia 04/15/2007   Essential hypertension 04/15/2007   DIVERTICULOSIS, COLON 04/15/2007   THUMB PAIN, RIGHT 04/15/2007   OSTEOPOROSIS 04/15/2007    Past Surgical History:  Procedure Laterality Date   EYE SURGERY  2012   B/L 01/2011  02/2011   HAMMER TOE SURGERY     Bilateral   ROTATOR CUFF REPAIR     Left   TUBAL LIGATION       OB History     Gravida  4   Para      Term      Preterm      AB      Living  4      SAB      IAB      Ectopic      Multiple  Live Births              Family History  Problem Relation Age of Onset   Hypertension Mother    Cancer Mother 49       ?   Hyperlipidemia Mother    Obesity Mother    Hypertension Father    Stroke Father    Heart disease Father    Hypertension Brother    Testicular cancer Brother 89   Hypertension Brother    Heart disease Brother        quadruple bypass   Colon cancer Neg Hx    Stomach cancer Neg Hx    Throat cancer Neg Hx     Social History   Tobacco Use   Smoking status: Former    Packs/day: 0.50    Years: 30.00    Pack years: 15.00    Types: Cigarettes    Quit date: 06/02/1991    Years since quitting: 30.3   Smokeless tobacco: Never  Vaping Use   Vaping Use: Never used  Substance Use Topics   Alcohol use: Yes    Alcohol/week: 5.0 standard drinks    Types: 5 Glasses of wine per week   Drug use: No    Home  Medications Prior to Admission medications   Medication Sig Start Date End Date Taking? Authorizing Provider  cephALEXin (KEFLEX) 500 MG capsule Take 1 capsule (500 mg total) by mouth 3 (three) times daily for 5 days. 09/29/21 10/04/21 Yes Davione Lenker, Ova Freshwater S, PA  Cholecalciferol (VITAMIN D3) 125 MCG (5000 UT) CAPS Take 1 capsule (5,000 Units total) by mouth daily. 04/05/19   Abby Potash, PA-C  clotrimazole-betamethasone (LOTRISONE) cream Apply 1 application topically 2 (two) times daily. Use as needed for rash 12/11/20   Carollee Herter, Kendrick Fries R, DO  fluticasone [facility]) 50 MCG/ACT nasal spray Place 2 sprays into both nostrils daily. 07/31/21   Roma Schanz R, DO  hydrochlorothiazide (HYDRODIURIL) 25 MG tablet TAKE 1/2 TABLET(12.5 MG) BY MOUTH EVERY MORNING 08/04/20   Roma Schanz R, DO  influenza vaccine adjuvanted (FLUAD) 0.5 ML injection Inject into the muscle. 09/23/21   Carlyle Basques, MD  lisinopril (ZESTRIL) 20 MG tablet TAKE 1 TABLET BY MOUTH DAILY 08/21/21   Carollee Herter, Alferd Apa, DO  MELATONIN ER PO Take by mouth.    [provider]  Multiple Vitamins-Minerals (ICAPS AREDS 2) CAPS Take 2 capsules by mouth daily.     [provider]  rosuvastatin (CRESTOR) 20 MG tablet TAKE 1 TABLET BY MOUTH EVERY DAY 08/05/21   Ann Held, DO    Allergies    Patient has no known allergies.  Review of Systems   Review of Systems  Constitutional:  Negative for chills and fever.  HENT:  Negative for congestion.   Eyes:  Negative for pain.  Respiratory:  Negative for cough and shortness of breath.   Cardiovascular:  Negative for chest pain and leg swelling.  Gastrointestinal:  Negative for abdominal pain and vomiting.  Genitourinary:  Negative for dysuria.  Musculoskeletal:  Negative for myalgias.  Skin:  Positive for wound. Negative for rash.  Neurological:  Negative for dizziness and headaches.   Physical Exam Updated Vital Signs BP (!) 165/72 (BP  Location: Left Arm)   Pulse 73   Temp 97.8 F (36.6 C) (Oral)   Resp 20   Ht 5' (1.524 m)   Wt 71.7 kg   SpO2 98%   BMI 30.86 kg/m   Physical Exam  Vitals and nursing note reviewed.  Constitutional:      General: She is not in acute distress.    Appearance: Normal appearance. She is not ill-appearing.  HENT:     Head: Normocephalic and atraumatic.  Eyes:     General: No scleral icterus.       Right eye: No discharge.        Left eye: No discharge.     Conjunctiva/sclera: Conjunctivae normal.  Cardiovascular:     Comments: Bilateral DP PT pulses 2+ and symmetric Pulmonary:     Effort: Pulmonary effort is normal.     Breath sounds: No stridor.  Skin:    General: Skin is warm and dry.     Comments: Small approximately 1.5 x 1 cm abrasion to right anterior shin not bleeding. No erythema surrounding the area.  Neurological:     Mental Status: She is alert and oriented to person, place, and time. Mental status is at baseline.    ED Results / Procedures / Treatments   Labs (all labs ordered are listed, but only abnormal results are displayed) Labs Reviewed - No data to display  EKG None  Radiology No results found.  Procedures Procedures   Medications Ordered in ED Medications  cephALEXin (KEFLEX) capsule 500 mg (500 mg Oral Given 09/29/21 2349)    ED Course  I have reviewed the triage vital signs and the nursing notes.  Pertinent labs & imaging results that were available during my care of the patient were reviewed by me and considered in my medical decision making (see chart for details).    MDM Rules/Calculators/A&P                          Patient is 80 year old female here with superficial abrasion to right shin it has created a somewhat ulcerative looking skin tear/skin avulsion.  It is clean there is no evidence of infection we will prophylax with Keflex  She states she is up-to-date on tetanus  Wound was by RN with bacitracin and nonstick and  bulky bandage and Coban over.  We will have wound check in 48 hours by PCP.  Return precautions given.   Final Clinical Impression(s) / ED Diagnoses Final diagnoses:  Wound of right lower extremity, initial encounter    Rx / DC Orders ED Discharge Orders          Ordered    cephALEXin (KEFLEX) 500 MG capsule  3 times daily        09/29/21 2343             Pati Gallo [place], [place] 09/30/21 0016    Palumbo, April, MD 09/30/21 0539

## 2021-09-29 NOTE — Discharge Instructions (Addendum)
Please have this wound reevaluated by your primary care provider sometime this week.  Keep the wound dressing in place for 24 hours you can switch this dressing tomorrow evening.  Keep the area clean and dry.  You can use antibiotic ointment or petroleum jelly to keep the area moist.  You may elevate slightly at night.

## 2021-09-29 NOTE — ED Triage Notes (Signed)
Right leg lac 2 days ago, unknown cause.  Slight bleeding continues.  Noted skin tear.  Pressure dressing applied

## 2021-09-30 ENCOUNTER — Other Ambulatory Visit (HOSPITAL_BASED_OUTPATIENT_CLINIC_OR_DEPARTMENT_OTHER): Payer: Self-pay

## 2021-09-30 ENCOUNTER — Ambulatory Visit: Payer: Medicare HMO

## 2021-10-04 ENCOUNTER — Other Ambulatory Visit: Payer: Self-pay

## 2021-10-04 ENCOUNTER — Other Ambulatory Visit (HOSPITAL_BASED_OUTPATIENT_CLINIC_OR_DEPARTMENT_OTHER): Payer: Self-pay

## 2021-10-04 ENCOUNTER — Ambulatory Visit (INDEPENDENT_AMBULATORY_CARE_PROVIDER_SITE_OTHER): Payer: Medicare HMO | Admitting: Medical

## 2021-10-04 VITALS — BP 140/60 | HR 87 | Temp 97.7°F | Resp 18 | Ht 60.0 in | Wt 162.6 lb

## 2021-10-04 DIAGNOSIS — T148XXD Other injury of unspecified body region, subsequent encounter: Secondary | ICD-10-CM | POA: Diagnosis not present

## 2021-10-04 DIAGNOSIS — L089 Local infection of the skin and subcutaneous tissue, unspecified: Secondary | ICD-10-CM

## 2021-10-04 MED ORDER — CEPHALEXIN 500 MG PO CAPS
500.0000 mg | ORAL_CAPSULE | Freq: Two times a day (BID) | ORAL | 0 refills | Status: DC
  Filled 2021-10-04: qty 8, 4d supply, fill #0

## 2021-10-04 MED ORDER — CEPHALEXIN 500 MG PO CAPS: 500.0000 mg | ORAL_CAPSULE | Freq: Two times a day (BID) | ORAL | 0 refills | Status: DC

## 2021-10-04 NOTE — Addendum Note (Signed)
Addended by: Anabel Halon on: 10/04/2021 11:29 AM   Modules accepted: Orders

## 2021-10-04 NOTE — Progress Notes (Signed)
Subjective:    Patient ID: Cindy Velasquez, female    DOB: 08/20/1941, 80 y.o.   MRN: [id number]  HPI Pt states one week ago she took jeans off and somehow ripped her ski rt distal tibia area. She states applied pressure initially for 45 minutes then put bandaid over area. Then on Sunday removed the dressing and the area started to bleed again.  Pt state in the ED she was given antibiotic and dressed wound.   Pt given gauze and coban in the ED.   ED visit note below on 09/29/2021.  "HPI Patient is an 80 year old female with past medical history detailed below presented to ER today with small abrasion to her right shoulder she states this is occurred 2 days ago she thinks it had occurred when she was pulling her pants off and her jeans zipper scraped her leg.  She states that she has had some issues with bleeding since this occurred.  She states that today she took off the bandage and her blood and this prompted her to come to the emergency room.  She states that it is no longer bleeding.   She believes that she is up-to-date on her tetanus.  Is not experiencing any pain.   No lightheadedness or dizziness.  Denies any major blood loss.   No other associate symptoms.  Aggravating factors apart from area is somewhat painful with pushing on it."  In ED. Exam. "Skin:    General: Skin is warm and dry.     Comments: Small approximately 1.5 x 1 cm abrasion to right anterior shin not bleeding. No erythema surrounding the area. "  "A/P in ED below.  Patient is 80 year old female here with superficial abrasion to right shin it has created a somewhat ulcerative looking skin tear/skin avulsion.   It is clean there is no evidence of infection we will prophylax with Keflex  She states she is up-to-date on tetanus  Wound was by RN with bacitracin and nonstick and bulky bandage and Coban over.  We will have wound check in 48 hours by PCP.  Return precautions given."   Pt redressed area on  Tuesday and Thursday.  07-2021 labs showed normal platlets.   Review of Systems  Constitutional:  Negative for diaphoresis, fatigue and unexpected weight change.  HENT:  Negative for dental problem and drooling.   Respiratory:  Negative for cough, chest tightness, shortness of breath and wheezing.   Cardiovascular:  Negative for chest pain and palpitations.  Gastrointestinal:  Negative for abdominal pain.  Skin:        Rt ler ext- see hpi and exam.      Objective:   Physical Exam  General- No acute distress. Pleasant patient. Lungs- Clear, even and unlabored. Heart- regular rate and rhythm. Neurologic- CNII- XII grossly intact.  Rt lower ext- small 8 mm x 4 mm area that bleeds after removal of gauze that was stuck to scabbed area. Soaked with normal saline but still peeled of gauze.  Applied gauze with pressure for about 10 minutes and stopped bleeding.     Assessment & Plan:   Patient Instructions  Right lower extremity area of delayed healing due to friable skin and possible skin infection.  Also I think you may accidentally peeling off healing scab when you change dressing.   We gave you various nonstick dressings to apply when you do dressing change.  Make sure you soak the area with a little bit of water as to decrease  chance of peeling off forming scab.  I did get wound culture of area to see if any bacteria/infection inhibiting healing.  Presently want you to wait 3 days to do next dressing change.  Thereafter remove every 2 days.  If area is not bleeding on dressing change then leave it open to air for 2 to 3 hours then redressed.  Sent in 4 additional days of Keflex pending wound culture.  Follow-up in 7 days or sooner if needed.  Note if the area is not healing by follow-up will consider referral to wound care as he might benefit from [employer] application.   Mackie Pai, PA-C   Time spent with patient today was 33  minutes which consisted of chart revdew,  discussing diagnosis, work up treatment and documentation.  It did take a while to stop bleeding today.  Also needed to educate patient and answer questions regarding dressing changes.

## 2021-10-04 NOTE — Patient Instructions (Addendum)
Right lower extremity area of delayed healing due to friable skin and possible skin infection.  Also I think you may accidentally peeling off healing scab when you change dressing.   We gave you various nonstick dressings to apply when you do dressing change.  Make sure you soak the area with a little bit of water as to decrease chance of peeling off forming scab.  I did get wound culture of area to see if any bacteria/infection inhibiting healing.  Presently want you to wait 3 days to do next dressing change.  Thereafter remove every 2 days.  If area is not bleeding on dressing change then leave it open to air for 2 to 3 hours then redressed.  Sent in 4 additional days of Keflex pending wound culture.  Follow-up in 7 days or sooner if needed.  Note if the area is not healing by follow-up will consider referral to wound care as he might benefit from [employer] application.

## 2021-10-04 NOTE — Addendum Note (Signed)
Addended by: Jeronimo Greaves on: 10/04/2021 11:39 AM   Modules accepted: Orders

## 2021-10-08 LAB — WOUND CULTURE
MICRO NUMBER:: 12565606
RESULT:: NO GROWTH
SPECIMEN QUALITY:: ADEQUATE

## 2021-10-10 ENCOUNTER — Other Ambulatory Visit: Payer: Self-pay

## 2021-10-10 ENCOUNTER — Encounter: Payer: Self-pay | Admitting: Family Medicine

## 2021-10-10 ENCOUNTER — Ambulatory Visit (INDEPENDENT_AMBULATORY_CARE_PROVIDER_SITE_OTHER): Payer: Medicare HMO | Admitting: Family Medicine

## 2021-10-10 ENCOUNTER — Other Ambulatory Visit (HOSPITAL_BASED_OUTPATIENT_CLINIC_OR_DEPARTMENT_OTHER): Payer: Self-pay

## 2021-10-10 VITALS — BP 120/70 | HR 85 | Temp 98.1°F | Resp 18 | Ht 60.0 in | Wt 162.0 lb

## 2021-10-10 DIAGNOSIS — S81801D Unspecified open wound, right lower leg, subsequent encounter: Secondary | ICD-10-CM | POA: Diagnosis not present

## 2021-10-10 DIAGNOSIS — T2210XA Burn of first degree of shoulder and upper limb, except wrist and hand, unspecified site, initial encounter: Secondary | ICD-10-CM | POA: Diagnosis not present

## 2021-10-10 DIAGNOSIS — S81802A Unspecified open wound, left lower leg, initial encounter: Secondary | ICD-10-CM | POA: Insufficient documentation

## 2021-10-10 MED ORDER — SILVER SULFADIAZINE 1 % EX CREA
1.0000 "application " | TOPICAL_CREAM | Freq: Every day | CUTANEOUS | 0 refills | Status: DC
  Filled 2021-10-10: qty 50, 50d supply, fill #0

## 2021-10-10 NOTE — Assessment & Plan Note (Signed)
abx finished  con't to cover during the day ---  Leave uncovered for 30 min or a little longer each day  F/u 2 weeks

## 2021-10-10 NOTE — Patient Instructions (Signed)
Burn Care, Adult A burn is an injury to the skin or the tissues under the skin that is caused by a fire, hot liquid, chemical, or electricity. There are three types of burns: First degree. These burns may cause the skin to be red and slightly swollen. These burns do not blister or scar. Second degree. These burns are very painful and cause the skin to be very red. The skin may also swell, leak fluid, look shiny, and develop blisters. Third degree. These burns cause permanent damage. They either turn the skin white or black and make it look charred, dry, and leathery. These burns may not be painful due to damage to the nerve endings. Treatment for your burn will depend on the type of burn you have. Taking care of your burn properly can help to prevent pain and infection. It can also helpthe burn heal more quickly. How to care for a first-degree burn Right after a burn: Rinse or soak the burn under cool water for 5 minutes or more. Do not put ice on your burn. This can cause more damage. Apply a cool, clean, wet cloth (cool compress) to your skin. This may help with pain. Put lotion or gel with aloe vera on your skin. This may help soothe the burn. Caring for the burn Follow instructions from your health care provider about cleaning and caring for the burn. This may include: Using mild soap and water to clean the area. Using a clean cloth to pat the burned area dry after cleaning it. Do not rub or scrub the burn. Applying lotion or gel with aloe vera to your skin. How to care for a second-degree burn Right after a burn: Rinse or soak the burn under cool water. Do this for 5 to 10 minutes. Do not put ice on your burn. This can cause more damage. Remove any jewelry near the burned area. Lightly cover the burn with a clean cloth. Caring for the burn Raise (elevate) the injured area above the level of your heart while sitting or lying down. Follow instructions from your health care provider about  cleaning and caring for the burn. This may include: Cleaning or rinsing out (irrigating) the burned area. Putting a cream or ointment on the burn. Placing a germ-free (sterile) dressing over the burn. A dressing is a material that is placed over a burn to help it heal. How to care for a third-degree burn Right after a burn: Lightly cover the burn with a clean, dry cloth. Seek treatment right away if you have this kind of burn. You may: Require admission to the hospital. Be treated with surgery to remove damaged tissue or to place a skin graft to cover the damaged area. Be given IV fluids to keep you hydrated. Caring for the burn Follow instructions from your health care provider about cleaning and caring for the burn. This may include: Cleaning or rinsing out (irrigating) the burned area. Putting a cream or ointment on the burn. Placing a sterile dressing in the burned area (packing). Placing a sterile dressing over the burn. Other instructions Elevate the injured area above the level of your heart while sitting or lying down. Wear splints or immobilizers as instructed by your health care provider. Rest as told by your health care provider. Do not participate in sports or other physical activities until your health care provider approves. How to prevent infection when caring for a burn  Take these steps to prevent infection: Wash your hands with soap   and water for at least 20 seconds before and after burn care. If soap and water are not available, use hand sanitizer. Wear clean or sterile gloves as directed by your health care provider. Do not put butter, oil, toothpaste, or other home remedies on the burn. Do not scratch or pick at the burn. Do not break any blisters. Do not peel the skin. Do not rub your burn, even when you are cleaning it. Check your burn every day for these signs of infection: More redness, swelling, or pain. Warmth. Pus or a bad smell. Red streaks around the  burn. Follow these instructions at home  Medicines Take over-the-counter and prescription medicines only as told by your health care provider. If you were prescribed an antibiotic medicine, take or apply it as told by your health care provider. Do not stop using the antibiotic even if your condition improves. Your health care provider may recommend taking over-the-counter or prescription pain medicine before changing your dressing. General instructions Protect your burn from the sun. Drink enough fluid to keep your urine pale yellow. Do not use any products that contain nicotine or tobacco, such as cigarettes, e-cigarettes, and chewing tobacco. These can delay healing. If you need help quitting, ask your health care provider. Keep all follow-up visits as told by your health care provider. This is important. Contact a health care provider if: Your condition does not improve. Your condition gets worse. You have a fever or chills. Your burn feels warm to the touch. You have more redness, swelling, or pain at the site of the burn. Your burn changes in appearance or develops black or red spots. You have pain that is not controlled with medicine. Get help right away if you have: More fluid, blood, or pus coming from your burn. Red streaks near the burn. Severe pain. Summary There are three types of burns. They are first degree, second degree, and third degree. The most severe type of burn is a third-degree burn which must be treated right away. Treatment for your burn will depend on the type of burn you have. Do not put butter, oil, toothpaste, or other home remedies on the burn. This can cause more damage to the tissue. Follow instructions from your health care provider about how to clean and take care of your burn. This information is not intended to replace advice given to you by your health care provider. Make sure you discuss any questions you have with your healthcare provider. Document  Revised: 09/13/2019 Document Reviewed: 09/13/2019 [employer] Patient Education  2022 [employer]  

## 2021-10-10 NOTE — Progress Notes (Addendum)
Subjective:   By signing my name below, I, [name], attest that this documentation has been prepared under the direction and in the presence of Dr. Roma Schanz, DO. 10/10/2021    Patient ID: Cindy Velasquez, female    DOB: January 23, 1941, 80 y.o.   MRN: [id number]  Chief Complaint  Patient presents with   Wound Check   Burn    X2 days, left forearm, Pt states getting burn from steam and states using Aloe    HPI Patient is in today for a office visit.   Wound- She is requesting to get a wound on her right lower leg evaluated. She reports 2 weeks ago on Friday she was removing her jean pants before going to bed and noticed later on she was bleeding from her lower leg. She does not know what caused it but think it may have been the jean pants zipper that cut her while she was removing it. She went to the ED on Sunday when she could not stop the bleeding and prescribed anti-biotics and non-stick dressing. Since then she stopped the bleeding and the wound is healing. It is not scabbing at this time. She reports when changing the dressing she lets it open for a couple of hours before changing it. She is planning to switch her dressing with a large bandage. Burn- She also reports having a burn on her left lower arm. It is not hurting or bothering her at this time.  Immunizations- She is UTD on tetanus vaccine.   Past Medical History:  Diagnosis Date   Adenomatous colon polyp    Cancer ([place]) 03/2015   BCC R tibia    Diverticulosis of colon    GERD (gastroesophageal reflux disease)    Hip pain    Hyperlipidemia    Hyperplastic colon polyp    Hypertension    Lactose intolerance    Loose stools    Lower back pain    Osteoporosis    Palpitations     Past Surgical History:  Procedure Laterality Date   EYE SURGERY  2012   B/L 01/2011  02/2011   HAMMER TOE SURGERY     Bilateral   ROTATOR CUFF REPAIR     Left   TUBAL LIGATION      Family History  Problem Relation Age of  Onset   Hypertension Mother    Cancer Mother 22       ?   Hyperlipidemia Mother    Obesity Mother    Hypertension Father    Stroke Father    Heart disease Father    Hypertension Brother    Testicular cancer Brother 34   Hypertension Brother    Heart disease Brother        quadruple bypass   Colon cancer Neg Hx    Stomach cancer Neg Hx    Throat cancer Neg Hx     Social History   Socioeconomic History   Marital status: Married    Spouse name: Cindy Velasquez   Number of children: 4   Years of education: Not on file   Highest education level: Not on file  Occupational History   Occupation: retired--[employer]  Tobacco Use   Smoking status: Former    Packs/day: 0.50    Years: 30.00    Pack years: 15.00    Types: Cigarettes    Quit date: 06/02/1991    Years since quitting: 30.3   Smokeless tobacco: Never  Vaping Use  Vaping Use: Never used  Substance and Sexual Activity   Alcohol use: Yes    Alcohol/week: 5.0 standard drinks    Types: 5 Glasses of wine per week   Drug use: No   Sexual activity: Not Currently    Partners: Male  Other Topics Concern   Not on file  Social History Narrative   Exercise--  [employer]--yoga and cardio 3x a week   Social Determinants of Health   Financial Resource Strain: Not on file  Food Insecurity: Not on file  Transportation Needs: Not on file  Physical Activity: Not on file  Stress: Not on file  Social Connections: Not on file  Intimate Partner Violence: Not on file    Outpatient Medications Prior to Visit  Medication Sig Dispense Refill   cephALEXin (KEFLEX) 500 MG capsule Take 1 capsule (500 mg total) by mouth 2 (two) times daily. 8 capsule 0   Cholecalciferol (VITAMIN D3) 125 MCG (5000 UT) CAPS Take 1 capsule (5,000 Units total) by mouth daily. 30 capsule 0   clotrimazole-betamethasone (LOTRISONE) cream Apply 1 application topically 2 (two) times daily. Use as needed for rash 30 g 1   fluticasone (FLONASE)  50 MCG/ACT nasal spray Place 2 sprays into both nostrils daily. 16 g 6   hydrochlorothiazide (HYDRODIURIL) 25 MG tablet TAKE 1/2 TABLET(12.5 MG) BY MOUTH EVERY MORNING 45 tablet 1   influenza vaccine adjuvanted (FLUAD) 0.5 ML injection Inject into the muscle. 0.5 mL 0   lisinopril (ZESTRIL) 20 MG tablet TAKE 1 TABLET BY MOUTH DAILY 90 tablet 1   MELATONIN ER PO Take by mouth.     Multiple Vitamins-Minerals (ICAPS AREDS 2) CAPS Take 2 capsules by mouth daily.      rosuvastatin (CRESTOR) 20 MG tablet TAKE 1 TABLET BY MOUTH EVERY DAY 90 tablet 0   No facility-administered medications prior to visit.    No Known Allergies  Review of Systems  Skin:        (+)open wound on right lower extremity (+)burn mark on left upper extremity      Objective:    Physical Exam Constitutional:      General: She is not in acute distress.    Appearance: Normal appearance. She is not ill-appearing.  HENT:     Head: Normocephalic and atraumatic.     Right Ear: External ear normal.     Left Ear: External ear normal.  Eyes:     Extraocular Movements: Extraocular movements intact.     Pupils: Pupils are equal, round, and reactive to light.  Cardiovascular:     Rate and Rhythm: Normal rate and regular rhythm.     Heart sounds: Normal heart sounds. No murmur heard.   No gallop.  Pulmonary:     Effort: Pulmonary effort is normal. No respiratory distress.     Breath sounds: Normal breath sounds. No wheezing or rales.  Skin:    General: Skin is warm and dry.     Findings: Burn (1st degree burn on left forearm) and wound (Right lower extremity) present.  Neurological:     Mental Status: She is alert and oriented to person, place, and time.  Psychiatric:        Behavior: Behavior normal.        Judgment: Judgment normal.    BP 120/70 (BP Location: Left Arm, Patient Position: Sitting, Cuff Size: Normal)   Pulse 85   Temp 98.1 F (36.7 C) (Oral)   Resp 18   Ht 5' (1.524 m)  Wt 162 lb (73.5 kg)    SpO2 96%   BMI 31.64 kg/m  Wt Readings from Last 3 Encounters:  10/10/21 162 lb (73.5 kg)  10/04/21 162 lb 9.6 oz (73.8 kg)  09/29/21 158 lb (71.7 kg)    Diabetic Foot Exam - Simple   No data filed    Lab Results  Component Value Date   WBC 7.7 07/08/2021   HGB 13.7 07/08/2021   HCT 41.8 07/08/2021   PLT 261 07/08/2021   GLUCOSE 160 (H) 07/08/2021   CHOL 142 06/04/2021   TRIG 170.0 (H) 06/04/2021   HDL 40.20 06/04/2021   LDLDIRECT 129.1 08/23/2007   LDLCALC 68 06/04/2021   ALT 28 06/04/2021   AST 24 06/04/2021   NA 141 07/08/2021   K 3.9 07/08/2021   CL 101 07/08/2021   CREATININE 0.74 07/08/2021   BUN 21 07/08/2021   CO2 34 (H) 07/08/2021   TSH 1.29 09/15/2019   INR 1.0 07/29/2020   HGBA1C 5.9 (H) 08/02/2020    Lab Results  Component Value Date   TSH 1.29 09/15/2019   Lab Results  Component Value Date   WBC 7.7 07/08/2021   HGB 13.7 07/08/2021   HCT 41.8 07/08/2021   MCV 91.3 07/08/2021   PLT 261 07/08/2021   Lab Results  Component Value Date   NA 141 07/08/2021   K 3.9 07/08/2021   CO2 34 (H) 07/08/2021   GLUCOSE 160 (H) 07/08/2021   BUN 21 07/08/2021   CREATININE 0.74 07/08/2021   BILITOT 0.5 06/04/2021   ALKPHOS 86 06/04/2021   AST 24 06/04/2021   ALT 28 06/04/2021   PROT 7.2 06/04/2021   ALBUMIN 4.1 06/04/2021   CALCIUM 10.0 07/08/2021   ANIONGAP 6 07/08/2021   GFR 82.29 06/04/2021   Lab Results  Component Value Date   CHOL 142 06/04/2021   Lab Results  Component Value Date   HDL 40.20 06/04/2021   Lab Results  Component Value Date   LDLCALC 68 06/04/2021   Lab Results  Component Value Date   TRIG 170.0 (H) 06/04/2021   Lab Results  Component Value Date   CHOLHDL 4 06/04/2021   Lab Results  Component Value Date   HGBA1C 5.9 (H) 08/02/2020       Assessment & Plan:   1. Burn of left arm, first degree, initial encounter Cream per orders  F/u prn  - silver sulfADIAZINE (SILVADENE) 1 % cream; Apply 1 application on to  the skin daily.  Dispense: 50 g; Refill: 0  2. Unspecified open wound, right lower leg, subsequent encounter Abx finished  Keep covered most of the time and uncover for at least 30 min each day      Meds ordered this encounter  Medications   silver sulfADIAZINE (SILVADENE) 1 % cream    Sig: Apply 1 application on to the skin daily.    Dispense:  50 g    Refill:  0    I, Dr. Roma Schanz, DO, personally preformed the services described in this documentation.  All medical record entries made by the scribe were at my direction and in my presence.  I have reviewed the chart and discharge instructions (if applicable) and agree that the record reflects my personal performance and is accurate and complete. 10/10/2021   I,[name],acting as a scribe for Ann Held, DO.,have documented all relevant documentation on the behalf of Ann Held, DO,as directed by  Ann Held, DO while  in the presence of Ann Held, DO.   Ann Held, DO

## 2021-10-10 NOTE — Assessment & Plan Note (Signed)
Silvadene cream

## 2021-10-21 ENCOUNTER — Other Ambulatory Visit (HOSPITAL_BASED_OUTPATIENT_CLINIC_OR_DEPARTMENT_OTHER): Payer: Self-pay

## 2021-10-21 MED ORDER — PFIZER COVID-19 VAC BIVALENT 30 MCG/0.3ML IM SUSP
INTRAMUSCULAR | 0 refills | Status: DC
  Filled 2021-10-21: qty 0.3, 1d supply, fill #0

## 2021-10-24 ENCOUNTER — Ambulatory Visit: Payer: Medicare HMO | Admitting: Family Medicine

## 2021-11-02 ENCOUNTER — Other Ambulatory Visit: Payer: Self-pay | Admitting: Family Medicine

## 2021-11-07 ENCOUNTER — Other Ambulatory Visit: Payer: Self-pay | Admitting: Family Medicine

## 2021-11-21 ENCOUNTER — Telehealth: Payer: Self-pay | Admitting: Family Medicine

## 2021-11-21 NOTE — Telephone Encounter (Signed)
Left message for patient to call back and schedule Medicare Annual Wellness Visit (AWV) in office.   If not able to come in office, please offer to do virtually or by telephone.  Left office number and my jabber [phone].  Last AWV:09/15/2019  Please schedule at anytime with Nurse Health Advisor.

## 2021-11-27 ENCOUNTER — Encounter: Payer: Self-pay | Admitting: Neurology

## 2021-11-27 ENCOUNTER — Ambulatory Visit: Payer: Medicare HMO | Admitting: Neurology

## 2021-11-27 ENCOUNTER — Other Ambulatory Visit: Payer: Self-pay

## 2021-11-27 VITALS — BP 174/91 | HR 82 | Ht 60.0 in | Wt 162.0 lb

## 2021-11-27 DIAGNOSIS — R202 Paresthesia of skin: Secondary | ICD-10-CM | POA: Diagnosis not present

## 2021-11-27 NOTE — Progress Notes (Signed)
[facility] Clinic Note - Initial Visit   Date: 11/27/21  Cindy Velasquez MRN: [id number] DOB: [birth year]   Dear Dr. Carollee Herter:  Thank you for your kind referral of Cindy Velasquez for consultation of hand numbness. Although her history is well known to you, please allow [place] to reiterate it for the purpose of our medical record. The patient was accompanied to the clinic by self.    History of Present Illness: Cindy Velasquez is a 80 y.o. right-handed female with hyperlipidemia, hypertension, and GERD presenting for evaluation of bilateral hand numbness.   Starting in early 2022, she began having numbness involving all the fingers on both hands.  Symptoms are constant and less noticeable when she is distracted.  Symptoms are worse when she is using her hands, such as shuffling cards and playing solitaire.  She does not have any pain, but reports stiffness in the hands.  It does not wake her up from sleeping, but tends not to sleep well from back and neck pain.  She endorses weakness in the hands with opening jars and bottles.  She is not dropping things.    She lives with husband in a one level home.  She is retired from working at a [employer].  Out-side paper records, electronic medical record, and images have been reviewed where available and summarized as:  Lab Results  Component Value Date   HGBA1C 5.9 (H) 08/02/2020   Lab Results  Component Value Date   [id number] 017 10/13/2014   Lab Results  Component Value Date   TSH 1.29 09/15/2019    Past Medical History:  Diagnosis Date   Adenomatous colon polyp    Cancer ([place]) 03/2015   BCC R tibia    Diverticulosis of colon    GERD (gastroesophageal reflux disease)    Hip pain    Hyperlipidemia    Hyperplastic colon polyp    Hypertension    Lactose intolerance    Loose stools    Lower back pain    Osteoporosis    Palpitations     Past Surgical History:  Procedure  Laterality Date   EYE SURGERY  2012   B/L 01/2011  02/2011   HAMMER TOE SURGERY     Bilateral   ROTATOR CUFF REPAIR     Left   TUBAL LIGATION       Medications:  Outpatient Encounter Medications as of 11/27/2021  Medication Sig   Cholecalciferol (VITAMIN D3) 125 MCG (5000 UT) CAPS Take 1 capsule (5,000 Units total) by mouth daily.   clotrimazole-betamethasone (LOTRISONE) cream Apply 1 application topically 2 (two) times daily. Use as needed for rash   COVID-19 mRNA bivalent vaccine, Pfizer, (PFIZER COVID-19 VAC BIVALENT) injection Inject into the muscle.   fluticasone (FLONASE) 50 MCG/ACT nasal spray Place 2 sprays into both nostrils daily.   hydrochlorothiazide (HYDRODIURIL) 25 MG tablet TAKE 1/2 TABLET(12.5 MG) BY MOUTH EVERY MORNING   influenza vaccine adjuvanted (FLUAD) 0.5 ML injection Inject into the muscle.   lisinopril (ZESTRIL) 20 MG tablet TAKE 1 TABLET BY MOUTH DAILY   MELATONIN ER PO Take by mouth.   Multiple Vitamins-Minerals (ICAPS AREDS 2) CAPS Take 2 capsules by mouth daily.    rosuvastatin (CRESTOR) 20 MG tablet TAKE 1 TABLET BY MOUTH EVERY DAY   silver sulfADIAZINE (SILVADENE) 1 % cream Apply 1 application on to the skin daily.   [DISCONTINUED] cephALEXin (KEFLEX) 500 MG capsule Take 1 capsule (500 mg total)  by mouth 2 (two) times daily. (Patient not taking: Reported on 11/27/2021)   No facility-administered encounter medications on file as of 11/27/2021.    Allergies: No Known Allergies  Family History: Family History  Problem Relation Age of Onset   Hypertension Mother    Cancer Mother 39       ?   Hyperlipidemia Mother    Obesity Mother    Hypertension Father    Stroke Father    Heart disease Father    Hypertension Brother    Testicular cancer Brother 50   Hypertension Brother    Heart disease Brother        quadruple bypass   Colon cancer Neg Hx    Stomach cancer Neg Hx    Throat cancer Neg Hx     Social History: Social History   Tobacco Use    Smoking status: Former    Packs/day: 0.50    Years: 30.00    Pack years: 15.00    Types: Cigarettes    Quit date: 06/02/1991    Years since quitting: 30.5   Smokeless tobacco: Never  Vaping Use   Vaping Use: Never used  Substance Use Topics   Alcohol use: Yes    Alcohol/week: 5.0 standard drinks    Types: 5 Glasses of wine per week   Drug use: No   Social History   Social History Narrative   Exercise--  [employer]--yoga and cardio 3x a week      Right Handed    Lives in a one story home     Vital Signs:  BP (!) 174/91    Pulse 82    Ht 5' (1.524 m)    Wt 162 lb (73.5 kg)    SpO2 97%    BMI 31.64 kg/m    Neurological Exam: MENTAL STATUS including orientation to time, place, person, recent and remote memory, attention span and concentration, language, and fund of knowledge is normal.  Speech is not dysarthric.  CRANIAL NERVES: II:  No visual field defects.   III-IV-VI: Pupils equal round and reactive to light.  Normal conjugate, extra-ocular eye movements in all directions of gaze.  No nystagmus.  Left ptosis(old).   V:  Normal facial sensation.    VII:  Normal facial symmetry and movements.   VIII:  Normal hearing and vestibular function.   IX-X:  Normal palatal movement.   XI:  Normal shoulder shrug and head rotation.   XII:  Normal tongue strength and range of motion, no deviation or fasciculation.  MOTOR:  No atrophy, fasciculations or abnormal movements.  No pronator drift.   Upper Extremity:  Right  Left  Deltoid  5/5   5/5   Biceps  5/5   5/5   Triceps  5/5   5/5   Infraspinatus 5/5  5/5  Medial pectoralis 5/5  5/5  Wrist extensors  5/5   5/5   Wrist flexors  5/5   5/5   Finger extensors  5/5   5/5   Finger flexors  5/5   5/5   Dorsal interossei  5/5   5/5   Abductor pollicis  5/5   5/5   Tone (Ashworth scale)  0  0   Lower Extremity:  Right  Left  Hip flexors  5/5   5/5   Hip extensors  5/5   5/5   Adductor 5/5  5/5  Abductor 5/5  5/5  Knee flexors   5/5   5/5   Knee  extensors  5/5   5/5   Dorsiflexors  5/5   5/5   Plantarflexors  5/5   5/5   Toe extensors  5/5   5/5   Toe flexors  5/5   5/5   Tone (Ashworth scale)  0  0   MSRs:  Right        Left                  brachioradialis 2+  2+  biceps 2+  2+  triceps 2+  2+  patellar 2+  2+  ankle jerk 2+  2+  Hoffman no  no  plantar response down  down   SENSORY:  Normal and symmetric perception of light touch, pinprick, vibration, and proprioception.  Tinel's sign at the wrist is absent bilaterally  COORDINATION/GAIT: Normal finger-to- nose-finger.  Intact rapid alternating movements bilaterally.   Gait narrow based and stable. Tandem and stressed gait intact.    IMPRESSION: Bilateral hand paresthesias. She has a normal neurological exam which makes localization difficulty. History is not suggestive of cervical radiculopathy. Entrapment neuropathy such as CTS is suspected. I recommend NCS/EMG of the arms to better characterize the nature of her symptoms.  Further recommendations pending results.    Thank you for allowing me to participate in patient's care.  If I can answer any additional questions, I would be pleased to do so.    Sincerely,    Alyvia Derk K. Posey Pronto, DO

## 2021-11-27 NOTE — Patient Instructions (Signed)
Nerve testing of the arms  ELECTROMYOGRAM AND NERVE CONDUCTION STUDIES (EMG/NCS) INSTRUCTIONS  How to Prepare The neurologist conducting the EMG will need to know if you have certain medical conditions. Tell the neurologist and other EMG lab personnel if you: Have a pacemaker or any other electrical medical device Take blood-thinning medications Have hemophilia, a blood-clotting disorder that causes prolonged bleeding Bathing Take a shower or bath shortly before your exam in order to remove oils from your skin. Dont apply lotions or creams before the exam.  What to Expect Youll likely be asked to change into a hospital gown for the procedure and lie down on an examination table. The following explanations can help you understand what will happen during the exam.  Electrodes. The neurologist or a technician places surface electrodes at various locations on your skin depending on where youre experiencing symptoms. Or the neurologist may insert needle electrodes at different sites depending on your symptoms.  Sensations. The electrodes will at times transmit a tiny electrical current that you may feel as a twinge or spasm. The needle electrode may cause discomfort or pain that usually ends shortly after the needle is removed. If you are concerned about discomfort or pain, you may want to talk to the neurologist about taking a short break during the exam.  Instructions. During the needle EMG, the neurologist will assess whether there is any spontaneous electrical activity when the muscle is at rest - activity that isnt present in healthy muscle tissue - and the degree of activity when you slightly contract the muscle.  He or she will give you instructions on resting and contracting a muscle at appropriate times. Depending on what muscles and nerves the neurologist is examining, he or she may ask you to change positions during the exam.  After your EMG You may experience some temporary, minor  bruising where the needle electrode was inserted into your muscle. This bruising should fade within several days. If it persists, contact your primary care doctor.

## 2021-12-26 ENCOUNTER — Encounter: Payer: Medicare HMO | Admitting: Neurology

## 2021-12-30 ENCOUNTER — Encounter: Payer: Self-pay | Admitting: Family Medicine

## 2021-12-30 ENCOUNTER — Ambulatory Visit (INDEPENDENT_AMBULATORY_CARE_PROVIDER_SITE_OTHER): Payer: Medicare HMO | Admitting: Family Medicine

## 2021-12-30 VITALS — BP 122/68 | HR 75 | Temp 97.7°F | Resp 18 | Ht 60.0 in | Wt 161.2 lb

## 2021-12-30 DIAGNOSIS — B351 Tinea unguium: Secondary | ICD-10-CM | POA: Diagnosis not present

## 2021-12-30 MED ORDER — TERBINAFINE HCL 250 MG PO TABS: 250.0000 mg | ORAL_TABLET | Freq: Every day | ORAL | 1 refills | Status: DC

## 2021-12-30 NOTE — Patient Instructions (Signed)

## 2021-12-30 NOTE — Progress Notes (Signed)
Established Patient Office Visit  Subjective:  Patient ID: Cindy Velasquez, female    DOB: 1941/04/23  Age: 81 y.o. MRN: [id number]  CC:  Chief Complaint  Patient presents with   Nail Problem    X2 months, left foot, big toe, Pt states she may have nail fungus. No discharge or bleeding. Pt states using OTC ointment and did not help.     HPI Cindy Velasquez presents for L big toe fungus.  She has tried otc and it did not work   Past Medical History:  Diagnosis Date   Adenomatous colon polyp    Cancer ([place]) 03/2015   BCC R tibia    Diverticulosis of colon    GERD (gastroesophageal reflux disease)    Hip pain    Hyperlipidemia    Hyperplastic colon polyp    Hypertension    Lactose intolerance    Loose stools    Lower back pain    Osteoporosis    Palpitations     Past Surgical History:  Procedure Laterality Date   EYE SURGERY  2012   B/L 01/2011  02/2011   HAMMER TOE SURGERY     Bilateral   ROTATOR CUFF REPAIR     Left   TUBAL LIGATION      Family History  Problem Relation Age of Onset   Hypertension Mother    Cancer Mother 86       ?   Hyperlipidemia Mother    Obesity Mother    Hypertension Father    Stroke Father    Heart disease Father    Hypertension Brother    Testicular cancer Brother 39   Hypertension Brother    Heart disease Brother        quadruple bypass   Colon cancer Neg Hx    Stomach cancer Neg Hx    Throat cancer Neg Hx     Social History   Socioeconomic History   Marital status: Married    Spouse name: Palma Buster   Number of children: 4   Years of education: Not on file   Highest education level: Not on file  Occupational History   Occupation: retired--[employer]  Tobacco Use   Smoking status: Former    Packs/day: 0.50    Years: 30.00    Pack years: 15.00    Types: Cigarettes    Quit date: 06/02/1991    Years since quitting: 30.6   Smokeless tobacco: Never  Vaping Use   Vaping Use: Never used  Substance  and Sexual Activity   Alcohol use: Yes    Alcohol/week: 5.0 standard drinks    Types: 5 Glasses of wine per week   Drug use: No   Sexual activity: Not Currently    Partners: Male  Other Topics Concern   Not on file  Social History Narrative   Exercise--  [employer]--yoga and cardio 3x a week      Right Handed    Lives in a one story home    Social Determinants of Health   Financial Resource Strain: Not on file  Food Insecurity: Not on file  Transportation Needs: Not on file  Physical Activity: Not on file  Stress: Not on file  Social Connections: Not on file  Intimate Partner Violence: Not on file    Outpatient Medications Prior to Visit  Medication Sig Dispense Refill   Cholecalciferol (VITAMIN D3) 125 MCG (5000 UT) CAPS Take 1 capsule (5,000 Units total) by mouth daily. [place]  capsule 0   clotrimazole-betamethasone (LOTRISONE) cream Apply 1 application topically 2 (two) times daily. Use as needed for rash 30 g 1   fluticasone (FLONASE) 50 MCG/ACT nasal spray Place 2 sprays into both nostrils daily. 16 g 6   hydrochlorothiazide (HYDRODIURIL) 25 MG tablet TAKE 1/2 TABLET(12.5 MG) BY MOUTH EVERY MORNING 45 tablet 1   lisinopril (ZESTRIL) 20 MG tablet TAKE 1 TABLET BY MOUTH DAILY 90 tablet 1   MELATONIN ER PO Take by mouth.     Multiple Vitamins-Minerals (ICAPS AREDS 2) CAPS Take 2 capsules by mouth daily.      rosuvastatin (CRESTOR) 20 MG tablet TAKE 1 TABLET BY MOUTH EVERY DAY 90 tablet 1   silver sulfADIAZINE (SILVADENE) 1 % cream Apply 1 application on to the skin daily. 50 g 0   COVID-19 mRNA bivalent vaccine, Pfizer, (PFIZER COVID-19 VAC BIVALENT) injection Inject into the muscle. (Patient not taking: Reported on 12/30/2021) 0.3 mL 0   influenza vaccine adjuvanted (FLUAD) 0.5 ML injection Inject into the muscle. (Patient not taking: Reported on 12/30/2021) 0.5 mL 0   No facility-administered medications prior to visit.    No Known Allergies  ROS Review of Systems   Constitutional:  Negative for activity change, appetite change, fatigue and unexpected weight change.  Respiratory:  Negative for cough and shortness of breath.   Cardiovascular:  Negative for chest pain and palpitations.  Skin:        Thickened L big toenail   Psychiatric/Behavioral:  Negative for behavioral problems and dysphoric mood. The patient is not nervous/anxious.      Objective:    Physical Exam Vitals and nursing note reviewed.  Constitutional:      Appearance: She is well-developed.  HENT:     Head: Normocephalic and atraumatic.  Eyes:     Conjunctiva/sclera: Conjunctivae normal.  Neck:     Thyroid: No thyromegaly.     Vascular: No carotid bruit or JVD.  Cardiovascular:     Rate and Rhythm: Normal rate and regular rhythm.     Heart sounds: Normal heart sounds. No murmur heard. Pulmonary:     Effort: Pulmonary effort is normal. No respiratory distress.     Breath sounds: Normal breath sounds. No wheezing or rales.  Chest:     Chest wall: No tenderness.  Musculoskeletal:     Cervical back: Normal range of motion and neck supple.  Skin:    Comments: L foot--- big toe Nail thick and yellow   Neurological:     Mental Status: She is alert and oriented to person, place, and time.    BP 122/68 (BP Location: Left Arm, Patient Position: Sitting, Cuff Size: Normal)    Pulse 75    Temp 97.7 F (36.5 C) (Oral)    Resp 18    Ht 5' (1.524 m)    Wt 161 lb 3.2 oz (73.1 kg)    SpO2 97%    BMI 31.48 kg/m  Wt Readings from Last 3 Encounters:  12/30/21 161 lb 3.2 oz (73.1 kg)  11/27/21 162 lb (73.5 kg)  10/10/21 162 lb (73.5 kg)     Health Maintenance Due  Topic Date Due   Zoster Vaccines- Shingrix (1 of 2) Never done    There are no preventive care reminders to display for this patient.  Lab Results  Component Value Date   TSH 1.29 09/15/2019   Lab Results  Component Value Date   WBC 7.7 07/08/2021   HGB 13.7 07/08/2021   HCT  41.8 07/08/2021   MCV 91.3  07/08/2021   PLT 261 07/08/2021   Lab Results  Component Value Date   NA 142 12/30/2021   K 4.4 12/30/2021   CO2 33 (H) 12/30/2021   GLUCOSE 89 12/30/2021   BUN 21 12/30/2021   CREATININE 0.76 12/30/2021   BILITOT 0.4 12/30/2021   ALKPHOS 80 12/30/2021   AST 20 12/30/2021   ALT 20 12/30/2021   PROT 6.9 12/30/2021   ALBUMIN 4.0 12/30/2021   CALCIUM 9.6 12/30/2021   ANIONGAP 6 07/08/2021   GFR 74.00 12/30/2021   Lab Results  Component Value Date   CHOL 142 06/04/2021   Lab Results  Component Value Date   HDL 40.20 06/04/2021   Lab Results  Component Value Date   LDLCALC 68 06/04/2021   Lab Results  Component Value Date   TRIG 170.0 (H) 06/04/2021   Lab Results  Component Value Date   CHOLHDL 4 06/04/2021   Lab Results  Component Value Date   HGBA1C 5.9 (H) 08/02/2020      Assessment & Plan:   Problem List Items Addressed This Visit       Unprioritized   Onychomycosis - Primary    Check liver function today and in 4 weeks  Lamisil ----- pt did not want to use another topical      Relevant Medications   terbinafine (LAMISIL) 250 MG tablet   Other Relevant Orders   Comprehensive metabolic panel (Completed)   Comp Met (CMET)    Meds ordered this encounter  Medications   terbinafine (LAMISIL) 250 MG tablet    Sig: Take 1 tablet (250 mg total) by mouth daily.    Dispense:  120 tablet    Refill:  1    Follow-up: Return in about 4 weeks (around 01/27/2022), or if symptoms worsen or fail to improve, for labs only.    Ann Held, DO

## 2021-12-31 DIAGNOSIS — B351 Tinea unguium: Secondary | ICD-10-CM | POA: Insufficient documentation

## 2021-12-31 LAB — COMPREHENSIVE METABOLIC PANEL
ALT: 20 U/L (ref 0–35)
AST: 20 U/L (ref 0–37)
Albumin: 4 g/dL (ref 3.5–5.2)
Alkaline Phosphatase: 80 U/L (ref 39–117)
BUN: 21 mg/dL (ref 6–23)
CO2: 33 mEq/L — ABNORMAL HIGH (ref 19–32)
Calcium: 9.6 mg/dL (ref 8.4–10.5)
Chloride: 103 mEq/L (ref 96–112)
Creatinine, Ser: 0.76 mg/dL (ref 0.40–1.20)
GFR: 74 mL/min (ref >60.00)
Glucose, Bld: 89 mg/dL (ref 70–99)
Potassium: 4.4 mEq/L (ref 3.5–5.1)
Sodium: 142 mEq/L (ref 135–145)
Total Bilirubin: 0.4 mg/dL (ref 0.2–1.2)
Total Protein: 6.9 g/dL (ref 6.0–8.3)

## 2021-12-31 NOTE — Assessment & Plan Note (Signed)
Check liver function today and in 4 weeks  Lamisil ----- pt did not want to use another topical

## 2022-01-28 ENCOUNTER — Ambulatory Visit (INDEPENDENT_AMBULATORY_CARE_PROVIDER_SITE_OTHER): Payer: Medicare HMO

## 2022-01-28 VITALS — BP 128/84 | HR 79 | Temp 98.5°F | Resp 16 | Ht 60.0 in | Wt 162.6 lb

## 2022-01-28 DIAGNOSIS — Z0000 Encounter for general adult medical examination without abnormal findings: Secondary | ICD-10-CM | POA: Diagnosis not present

## 2022-01-28 NOTE — Progress Notes (Signed)
Subjective:   Cindy Velasquez is a 81 y.o. female who presents for Medicare Annual (Subsequent) preventive examination.  Review of Systems     Cardiac Risk Factors include: advanced age (>10men, >55 women);hypertension;dyslipidemia;obesity (BMI >30kg/m2)     Objective:    Today's Vitals   01/28/22 1245  BP: 128/84  Pulse: 79  Resp: 16  Temp: 98.5 F (36.9 C)  TempSrc: Oral  SpO2: 96%  Weight: 162 lb 9.6 oz (73.8 kg)  Height: 5' (1.524 m)   Body mass index is 31.76 kg/m.  Advanced Directives 01/28/2022 11/27/2021 09/29/2021 07/08/2021 07/29/2020 06/13/2020 02/16/2020  Does Patient Have a Medical Advance Directive? Yes Yes No Yes Yes No Yes  Type of Paramedic of [place];Living will [place];Living will;Out of facility DNR (pink MOST or yellow form) - [place];Living will Living will - -  Does patient want to make changes to medical advance directive? - - - - No - Patient declined - -  Copy of [place] in Chart? No - copy requested - - No - copy requested - - -  Would patient like information on creating a medical advance directive? - - - - - No - Patient declined -    Current Medications (verified) Outpatient Encounter Medications as of 01/28/2022  Medication Sig   Cholecalciferol (VITAMIN D3) 125 MCG (5000 UT) CAPS Take 1 capsule (5,000 Units total) by mouth daily.   clotrimazole-betamethasone (LOTRISONE) cream Apply 1 application topically 2 (two) times daily. Use as needed for rash   fluticasone (FLONASE) 50 MCG/ACT nasal spray Place 2 sprays into both nostrils daily.   hydrochlorothiazide (HYDRODIURIL) 25 MG tablet TAKE 1/2 TABLET(12.5 MG) BY MOUTH EVERY MORNING   lisinopril (ZESTRIL) 20 MG tablet TAKE 1 TABLET BY MOUTH DAILY   MELATONIN ER PO Take by mouth.   Multiple Vitamins-Minerals (ICAPS AREDS 2) CAPS Take 2 capsules by mouth daily.    rosuvastatin (CRESTOR) 20 MG tablet TAKE 1 TABLET BY  MOUTH EVERY DAY   silver sulfADIAZINE (SILVADENE) 1 % cream Apply 1 application on to the skin daily.   terbinafine (LAMISIL) 250 MG tablet Take 1 tablet (250 mg total) by mouth daily.   COVID-19 mRNA bivalent vaccine, Pfizer, (PFIZER COVID-19 VAC BIVALENT) injection Inject into the muscle. (Patient not taking: Reported on 12/30/2021)   influenza vaccine adjuvanted (FLUAD) 0.5 ML injection Inject into the muscle. (Patient not taking: Reported on 12/30/2021)   No facility-administered encounter medications on file as of 01/28/2022.    Allergies (verified) Patient has no known allergies.   History: Past Medical History:  Diagnosis Date   Adenomatous colon polyp    Cancer ([place]) 03/2015   BCC R tibia    Diverticulosis of colon    GERD (gastroesophageal reflux disease)    Hip pain    Hyperlipidemia    Hyperplastic colon polyp    Hypertension    Lactose intolerance    Loose stools    Lower back pain    Osteoporosis    Palpitations    Past Surgical History:  Procedure Laterality Date   EYE SURGERY  2012   B/L 01/2011  02/2011   HAMMER TOE SURGERY     Bilateral   ROTATOR CUFF REPAIR     Left   TUBAL LIGATION     Family History  Problem Relation Age of Onset   Hypertension Mother    Cancer Mother 43       ?  Hyperlipidemia Mother    Obesity Mother    Hypertension Father    Stroke Father    Heart disease Father    Hypertension Brother    Testicular cancer Brother 98   Hypertension Brother    Heart disease Brother        quadruple bypass   Colon cancer Neg Hx    Stomach cancer Neg Hx    Throat cancer Neg Hx    Social History   Socioeconomic History   Marital status: Married    Spouse name: Ladrea Holladay   Number of children: 4   Years of education: Not on file   Highest education level: Not on file  Occupational History   Occupation: retired--[employer]  Tobacco Use   Smoking status: Former    Packs/day: 0.50    Years: 30.00    Pack  years: 15.00    Types: Cigarettes    Quit date: 06/02/1991    Years since quitting: 30.6   Smokeless tobacco: Never  Vaping Use   Vaping Use: Never used  Substance and Sexual Activity   Alcohol use: Yes    Alcohol/week: 5.0 standard drinks    Types: 5 Glasses of wine per week   Drug use: No   Sexual activity: Not Currently    Partners: Male  Other Topics Concern   Not on file  Social History Narrative   Exercise--  [employer]--yoga and cardio 3x a week      Right Handed    Lives in a one story home    Social Determinants of Health   Financial Resource Strain: Low Risk    Difficulty of Paying Living Expenses: Not hard at all  Food Insecurity: No Food Insecurity   Worried About Charity fundraiser in the Last Year: Never true   [place] in the Last Year: Never true  Transportation Needs: No Transportation Needs   Lack of Transportation (Medical): No   Lack of Transportation (Non-Medical): No  Physical Activity: Sufficiently Active   Days of Exercise per Week: 3 days   Minutes of Exercise per Session: 60 min  Stress: No Stress Concern Present   Feeling of Stress : Not at all  Social Connections: Moderately Integrated   Frequency of Communication with Friends and Family: More than three times a week   Frequency of Social Gatherings with Friends and Family: More than three times a week   Attends Religious Services: Never   Marine scientist or Organizations: Yes   Attends Music therapist: More than 4 times per year   Marital Status: Married    Tobacco Counseling Counseling given: Not Answered   Clinical Intake:  Pre-visit preparation completed: Yes  Pain : 0-10 Pain Type: Chronic pain Pain Location: Back (and neck) Pain Onset: More than a month ago Pain Frequency: Intermittent     BMI - recorded: 31.76 Nutritional Status: BMI > 30  Obese Nutritional Risks: None Diabetes: No  How often do you need to have someone help you when you read  instructions, pamphlets, or other written materials from your doctor or pharmacy?: 1 - Never  Diabetic?No  Interpreter Needed?: No  Information entered by :: Caroleen Hamman LPN   Activities of Daily Living In your present state of health, do you have any difficulty performing the following activities: 01/28/2022  Hearing? N  Vision? N  Difficulty concentrating or making decisions? N  Walking or climbing stairs? N  Dressing or bathing? N  Doing errands, shopping? N  Preparing Food and eating ? N  Using the Toilet? N  In the past six months, have you accidently leaked urine? Y  Comment occasionally  Do you have problems with loss of bowel control? N  Managing your Medications? N  Managing your Finances? N  Housekeeping or managing your Housekeeping? N  Some recent data might be hidden    Patient Care Team: Carollee Herter, Alferd Apa, DO as PCP - General Clent Jacks, MD as Consulting Physician (Ophthalmology) Irene Shipper, MD as Consulting Physician (Gastroenterology) Day, Melvenia Beam, [facility] (Inactive) as Pharmacist (Pharmacist) Alda Berthold, DO as Consulting Physician (Neurology)  Indicate any recent Medical Services you may have received from other than [facility] providers in the past year (date may be approximate).     Assessment:   This is a routine wellness examination for [place].  Hearing/Vision screen Hearing Screening - Comments:: No issues Vision Screening - Comments:: Last eye exam-12/2021-Dr.Groat  Dietary issues and exercise activities discussed: Current Exercise Habits: Home exercise routine;Structured exercise class, Type of exercise: yoga;walking;strength training/weights, Time (Minutes): 60, Frequency (Times/Week): 3, Weekly Exercise (Minutes/Week): 180, Intensity: Mild, Exercise limited by: None identified   Goals Addressed             This Visit's Progress    Increase physical activity   On track    Increase water intake   On track    Patient Stated        Lose about 10 pounds       Depression Screen PHQ 2/9 Scores 01/28/2022 06/04/2021 09/24/2020 09/15/2019 09/06/2018 09/03/2018 09/03/2017  PHQ - 2 Score 0 0 0 0 2 0 0  PHQ- 9 Score - - - - 7 - -    Fall Risk Fall Risk  01/28/2022 11/27/2021 09/24/2020 09/15/2019 09/03/2018  Falls in the past year? 0 0 1 0 No  Number falls in past yr: 0 0 0 0 -  Injury with Fall? 0 0 1 0 -  Follow up Falls prevention discussed - Falls evaluation completed - -    FALL RISK PREVENTION PERTAINING TO THE HOME:  Any stairs in or around the home? No  Home free of loose throw rugs in walkways, pet beds, electrical cords, etc? Yes  Adequate lighting in your home to reduce risk of falls? Yes   ASSISTIVE DEVICES UTILIZED TO PREVENT FALLS:  Life alert? No  Use of a cane, walker or w/c? No  Grab bars in the bathroom? No  Shower chair or bench in shower? Yes  Elevated toilet seat or a handicapped toilet? No   TIMED UP AND GO:  Was the test performed? Yes .  Length of time to ambulate 10 feet: 10 sec.   Gait steady and fast without use of assistive device  Cognitive Function:Normal cognitive status assessed by direct observation by this Nurse Health Advisor. No abnormalities found.   MMSE - Mini Mental State Exam 08/05/2016  Orientation to time 5  Orientation to Place 5  Registration 3  Attention/ Calculation 5  Recall 3  Language- name 2 objects 2  Language- repeat 1  Language- follow 3 step command 3  Language- read & follow direction 1  Write a sentence 1  Copy design 0  Total score 29        Immunizations Immunization History  Administered Date(s) Administered   Fluad Quad(high Dose 65+) 09/24/2020, 09/23/2021   H1N1 12/28/2008   Influenza Whole 10/20/2007, 10/18/2008, 11/15/2012   Influenza, High Dose  Seasonal PF 09/03/2018   Influenza-Unspecified 12/20/2013, 11/16/2016   PFIZER(Purple Top)SARS-COV-2 Vaccination 12/19/2019, 01/08/2020, 09/28/2020   Pfizer Covid-19 Vaccine Bivalent  Booster 72yrs & up 09/23/2021   Pneumococcal Conjugate-13 04/23/2015   Pneumococcal Polysaccharide-23 12/28/2008   Td 06/04/2021   Tdap 08/01/2013    TDAP status: Up to date  Flu Vaccine status: Up to date  Pneumococcal vaccine status: Up to date  Covid-19 vaccine status: Completed vaccines  Qualifies for Shingles Vaccine? Yes   Zostavax completed No   Shingrix Completed?: No.    Education has been provided regarding the importance of this vaccine. Patient has been advised to call insurance company to determine out of pocket expense if they have not yet received this vaccine. Advised may also receive vaccine at local pharmacy or Health Dept. Verbalized acceptance and understanding.  Screening Tests Health Maintenance  Topic Date Due   Zoster Vaccines- Shingrix (1 of 2) Never done   MAMMOGRAM  09/10/2022   TETANUS/TDAP  06/05/2031   Pneumonia Vaccine 28+ Years old  Completed   INFLUENZA VACCINE  Completed   DEXA SCAN  Completed   COVID-19 Vaccine  Completed   HPV VACCINES  Aged Out    Health Maintenance  Health Maintenance Due  Topic Date Due   Zoster Vaccines- Shingrix (1 of 2) Never done    Colorectal cancer screening: No longer required.   Mammogram status: Completed bilateral 09/10/2021. Repeat every year  Bone Density status: Declined  Lung Cancer Screening: (Low Dose CT Chest recommended if Age 58-80 years, 30 pack-year currently smoking OR have quit w/in 15years.) does not qualify.     Additional Screening:  Hepatitis C Screening: does not qualify  Vision Screening: Recommended annual ophthalmology exams for early detection of glaucoma and other disorders of the eye. Is the patient up to date with their annual eye exam?  Yes  Who is the provider or what is the name of the office in which the patient attends annual eye exams? Dr. Katy Fitch   Dental Screening: Recommended annual dental exams for proper oral hygiene  Community Resource Referral / Chronic Care  Management: CRR required this visit?  No   CCM required this visit?  No      Plan:     I have personally reviewed and noted the following in the patients chart:   Medical and social history Use of alcohol, tobacco or illicit drugs  Current medications and supplements including opioid prescriptions.  Functional ability and status Nutritional status Physical activity Advanced directives List of other physicians Hospitalizations, surgeries, and ER visits in previous 12 months Vitals Screenings to include cognitive, depression, and falls Referrals and appointments  In addition, I have reviewed and discussed with patient certain preventive protocols, quality metrics, and best practice recommendations. A written personalized care plan for preventive services as well as general preventive health recommendations were provided to patient.     Marta Antu, LPN   5/70/1779  Nurse health Advisor  Nurse Notes: None

## 2022-01-28 NOTE — Patient Instructions (Signed)
Cindy Velasquez , Thank you for taking time to come for your Medicare Wellness Visit. I appreciate your ongoing commitment to your health goals. Please review the following plan we discussed and let me know if I can assist you in the future.   Screening recommendations/referrals: Colonoscopy: No longer required Mammogram: Completed 09/10/2021-Due 09/10/2022 Bone Density: Due-Declined today Recommended yearly ophthalmology/optometry visit for glaucoma screening and checkup Recommended yearly dental visit for hygiene and checkup  Vaccinations: Influenza vaccine: Up to date Pneumococcal vaccine: Up to date Tdap vaccine: Up to date Shingles vaccine: Up to date-Please bring dates to next visit.   Covid-19:Up to date  Advanced directives: Please bring a copy of Living Will and/or Healthcare Power of Attorney for your chart.   Conditions/risks identified: See problem list  Next appointment: Follow up in one year for your annual wellness visit    Preventive Care 65 Years and Older, Female Preventive care refers to lifestyle choices and visits with your health care provider that can promote health and wellness. What does preventive care include? A yearly physical exam. This is also called an annual well check. Dental exams once or twice a year. Routine eye exams. Ask your health care provider how often you should have your eyes checked. Personal lifestyle choices, including: Daily care of your teeth and gums. Regular physical activity. Eating a healthy diet. Avoiding tobacco and drug use. Limiting alcohol use. Practicing safe sex. Taking low-dose aspirin every day. Taking vitamin and mineral supplements as recommended by your health care provider. What happens during an annual well check? The services and screenings done by your health care provider during your annual well check will depend on your age, overall health, lifestyle risk factors, and family history of disease. Counseling  Your  health care provider may ask you questions about your: Alcohol use. Tobacco use. Drug use. Emotional well-being. Home and relationship well-being. Sexual activity. Eating habits. History of falls. Memory and ability to understand (cognition). Work and work Statistician. Reproductive health. Screening  You may have the following tests or measurements: Height, weight, and BMI. Blood pressure. Lipid and cholesterol levels. These may be checked every 5 years, or more frequently if you are over 63 years old. Skin check. Lung cancer screening. You may have this screening every year starting at age 17 if you have a 30-pack-year history of smoking and currently smoke or have quit within the past 15 years. Fecal occult blood test (FOBT) of the stool. You may have this test every year starting at age 29. Flexible sigmoidoscopy or colonoscopy. You may have a sigmoidoscopy every 5 years or a colonoscopy every 10 years starting at age 57. Hepatitis C blood test. Hepatitis B blood test. Sexually transmitted disease (STD) testing. Diabetes screening. This is done by checking your blood sugar (glucose) after you have not eaten for a while (fasting). You may have this done every 1-3 years. Bone density scan. This is done to screen for osteoporosis. You may have this done starting at age 75. Mammogram. This may be done every 1-2 years. Talk to your health care provider about how often you should have regular mammograms. Talk with your health care provider about your test results, treatment options, and if necessary, the need for more tests. Vaccines  Your health care provider may recommend certain vaccines, such as: Influenza vaccine. This is recommended every year. Tetanus, diphtheria, and acellular pertussis (Tdap, Td) vaccine. You may need a Td booster every 10 years. Zoster vaccine. You may need this after  age 45. Pneumococcal 13-valent conjugate (PCV13) vaccine. One dose is recommended after age  69. Pneumococcal polysaccharide (PPSV23) vaccine. One dose is recommended after age 47. Talk to your health care provider about which screenings and vaccines you need and how often you need them. This information is not intended to replace advice given to you by your health care provider. Make sure you discuss any questions you have with your health care provider. Document Released: 12/21/2015 Document Revised: 08/13/2016 Document Reviewed: 09/25/2015 Elsevier Interactive Patient Education  2017 [employer] Prevention in the Home Falls can cause injuries. They can happen to people of all ages. There are many things you can do to make your home safe and to help prevent falls. What can I do on the outside of my home? Regularly fix the edges of walkways and driveways and fix any cracks. Remove anything that might make you trip as you walk through a door, such as a raised step or threshold. Trim any bushes or trees on the path to your home. Use bright outdoor lighting. Clear any walking paths of anything that might make someone trip, such as rocks or tools. Regularly check to see if handrails are loose or broken. Make sure that both sides of any steps have handrails. Any raised decks and porches should have guardrails on the edges. Have any leaves, snow, or ice cleared regularly. Use sand or salt on walking paths during winter. Clean up any spills in your garage right away. This includes oil or grease spills. What can I do in the bathroom? Use night lights. Install grab bars by the toilet and in the tub and shower. Do not use towel bars as grab bars. Use non-skid mats or decals in the tub or shower. If you need to sit down in the shower, use a plastic, non-slip stool. Keep the floor dry. Clean up any water that spills on the floor as soon as it happens. Remove soap buildup in the tub or shower regularly. Attach bath mats securely with double-sided non-slip rug tape. Do not have throw  rugs and other things on the floor that can make you trip. What can I do in the bedroom? Use night lights. Make sure that you have a light by your bed that is easy to reach. Do not use any sheets or blankets that are too big for your bed. They should not hang down onto the floor. Have a firm chair that has side arms. You can use this for support while you get dressed. Do not have throw rugs and other things on the floor that can make you trip. What can I do in the kitchen? Clean up any spills right away. Avoid walking on wet floors. Keep items that you use a lot in easy-to-reach places. If you need to reach something above you, use a strong step stool that has a grab bar. Keep electrical cords out of the way. Do not use floor polish or wax that makes floors slippery. If you must use wax, use non-skid floor wax. Do not have throw rugs and other things on the floor that can make you trip. What can I do with my stairs? Do not leave any items on the stairs. Make sure that there are handrails on both sides of the stairs and use them. Fix handrails that are broken or loose. Make sure that handrails are as long as the stairways. Check any carpeting to make sure that it is firmly attached to the stairs.  Fix any carpet that is loose or worn. Avoid having throw rugs at the top or bottom of the stairs. If you do have throw rugs, attach them to the floor with carpet tape. Make sure that you have a light switch at the top of the stairs and the bottom of the stairs. If you do not have them, ask someone to add them for you. What else can I do to help prevent falls? Wear shoes that: Do not have high heels. Have rubber bottoms. Are comfortable and fit you well. Are closed at the toe. Do not wear sandals. If you use a stepladder: Make sure that it is fully opened. Do not climb a closed stepladder. Make sure that both sides of the stepladder are locked into place. Ask someone to hold it for you, if  possible. Clearly mark and make sure that you can see: Any grab bars or handrails. First and last steps. Where the edge of each step is. Use tools that help you move around (mobility aids) if they are needed. These include: Canes. Walkers. Scooters. Crutches. Turn on the lights when you go into a dark area. Replace any light bulbs as soon as they burn out. Set up your furniture so you have a clear path. Avoid moving your furniture around. If any of your floors are uneven, fix them. If there are any pets around you, be aware of where they are. Review your medicines with your doctor. Some medicines can make you feel dizzy. This can increase your chance of falling. Ask your doctor what other things that you can do to help prevent falls. This information is not intended to replace advice given to you by your health care provider. Make sure you discuss any questions you have with your health care provider. Document Released: 09/20/2009 Document Revised: 05/01/2016 Document Reviewed: 12/29/2014 Elsevier Interactive Patient Education  2017 [employer].

## 2022-01-29 ENCOUNTER — Other Ambulatory Visit: Payer: Medicare HMO

## 2022-02-04 ENCOUNTER — Ambulatory Visit: Payer: Medicare HMO | Admitting: Neurology

## 2022-02-04 ENCOUNTER — Other Ambulatory Visit: Payer: Self-pay

## 2022-02-04 DIAGNOSIS — G5602 Carpal tunnel syndrome, left upper limb: Secondary | ICD-10-CM

## 2022-02-04 DIAGNOSIS — M542 Cervicalgia: Secondary | ICD-10-CM

## 2022-02-04 NOTE — Progress Notes (Signed)
Follow-up Visit   Date: 02/04/22   Cindy Velasquez MRN: [id number] DOB: 1941-04-11   Interim History: Cindy Velasquez is a 81 y.o. right-handed Caucasian female with hyperlipidemia, hypertension, and GERD returning to the clinic for follow-up of bilateral hand numbness.  The patient was accompanied to the clinic by self.  History of present illness: Starting in early 2022, she began having numbness involving all the fingers on both hands.  Symptoms are constant and less noticeable when she is distracted.  Symptoms are worse when she is using her hands, such as shuffling cards and playing solitaire.  She does not have any pain, but reports stiffness in the hands.  It does not wake her up from sleeping, but tends not to sleep well from back and neck pain.  She endorses weakness in the hands with opening jars and bottles.  She is not dropping things.     She lives with husband in a one level home.  She is retired from working at a Dentist.   UPDATE 02/04/2022:  There has been no change with the degree of numbness in the hands, which remains constant. She is here for EDX of the upper extremities.  She endorses chronic neck pain.  No radicular pain or weakness.   Medications:  Current Outpatient Medications on File Prior to Visit  Medication Sig Dispense Refill   Cholecalciferol (VITAMIN D3) 125 MCG (5000 UT) CAPS Take 1 capsule (5,000 Units total) by mouth daily. 30 capsule 0   clotrimazole-betamethasone (LOTRISONE) cream Apply 1 application topically 2 (two) times daily. Use as needed for rash 30 g 1   COVID-19 mRNA bivalent vaccine, Pfizer, (PFIZER COVID-19 VAC BIVALENT) injection Inject into the muscle. (Patient not taking: Reported on 12/30/2021) 0.3 mL 0   fluticasone (FLONASE) 50 MCG/ACT nasal spray Place 2 sprays into both nostrils daily. 16 g 6   hydrochlorothiazide (HYDRODIURIL) 25 MG tablet TAKE 1/2 TABLET(12.5 MG) BY MOUTH EVERY MORNING 45 tablet 1    influenza vaccine adjuvanted (FLUAD) 0.5 ML injection Inject into the muscle. (Patient not taking: Reported on 12/30/2021) 0.5 mL 0   lisinopril (ZESTRIL) 20 MG tablet TAKE 1 TABLET BY MOUTH DAILY 90 tablet 1   MELATONIN ER PO Take by mouth.     Multiple Vitamins-Minerals (ICAPS AREDS 2) CAPS Take 2 capsules by mouth daily.      rosuvastatin (CRESTOR) 20 MG tablet TAKE 1 TABLET BY MOUTH EVERY DAY 90 tablet 1   silver sulfADIAZINE (SILVADENE) 1 % cream Apply 1 application on to the skin daily. 50 g 0   terbinafine (LAMISIL) 250 MG tablet Take 1 tablet (250 mg total) by mouth daily. 120 tablet 1   No current facility-administered medications on file prior to visit.    Allergies: No Known Allergies  Vital Signs:  There were no vitals taken for this visit.   Exam deferred, see note on 11/27/2021  Data: NCS/EMG of the upper extremities 02/04/2022: Left median neuropathy at or distal to the wrist, consistent with a clinical diagnosis of carpal tunnel syndrome.  Overall, these findings are very mild in degree electrically. There is no evidence of right carpal tunnel syndrome or cervical radiculopathy affecting the upper extremities.  IMPRESSION/PLAN: Bilateral hand numbness, NCS/EMG shows very mild left CTS, otherwise no evidence of neuropathy or cervical radiculopathy to explain her symptoms.  She complains of chronic neck pain.  It's possible she may have some degree of cervical spondylosis.  I recommend starting  neck PT.  If numbness does not improve with PT, the next step is MRI cervical spine wo contrast.     Thank you for allowing me to participate in patient's care.  If I can answer any additional questions, I would be pleased to do so.    Sincerely,    Mirely Pangle K. Posey Pronto, DO

## 2022-02-04 NOTE — Procedures (Signed)
[facility]  [address]  [place], [place] [address] Tel: [phone] Fax:  [phone] Test Date:  02/04/2022  Patient: Cindy Velasquez DOB: 11/08/1941 Physician: Narda Amber, DO  Sex: Female Height: 5' " Ref Phys: Narda Amber, DO  ID#: [id number]   Technician:    Patient Complaints: This is a 81 year old female referred for evaluation of bilateral hand numbness.  NCV & EMG Findings: Extensive electrodiagnostic testing of the right upper extremity and additional studies of the left shows:  Bilateral median, ulnar, and right mixed palmar sensory responses are within normal limits.  Left mixed palmar sensory response shows mildly prolonged latency. Bilateral median and ulnar motor responses are within normal limits. There is no evidence of active or chronic motor axonal loss changes affecting any of the tested muscles.  Motor unit configuration and recruitment pattern is within normal limits.  Impression: Left median neuropathy at or distal to the wrist, consistent with a clinical diagnosis of carpal tunnel syndrome.  Overall, these findings are very mild in degree electrically. There is no evidence of right carpal tunnel syndrome or cervical radiculopathy affecting the upper extremities.   ___________________________ Narda Amber, DO    Nerve Conduction Studies Anti Sensory Summary Table   Stim Site NR Peak (ms) Norm Peak (ms) P-T Amp (V) Norm P-T Amp  Left Median Anti Sensory (2nd Digit)  32C  Wrist    3.2 <3.8 28.5 >10  Right Median Anti Sensory (2nd Digit)  32C  Wrist    2.9 <3.8 25.4 >10  Left Ulnar Anti Sensory (5th Digit)  32C  Wrist    2.5 <3.2 37.7 >5  Right Ulnar Anti Sensory (5th Digit)  32C  Wrist    2.4 <3.2 32.2 >5   Motor Summary Table   Stim Site NR Onset (ms) Norm Onset (ms) O-P Amp (mV) Norm O-P Amp Site1 Site2 Delta-0 (ms) Dist (cm) Vel (m/s) Norm Vel (m/s)  Left Median Motor (Abd Poll Brev)  32C  Wrist    3.4 <4.0 9.6 >5  Elbow Wrist 4.7 26.0 55 >50  Elbow    8.1  8.9         Right Median Motor (Abd Poll Brev)  32C  Wrist    3.6 <4.0 7.7 >5 Elbow Wrist 4.0 26.0 65 >50  Elbow    7.6  7.0         Left Ulnar Motor (Abd Dig Minimi)  32C  Wrist    1.9 <3.1 7.2 >7 B Elbow Wrist 3.2 20.0 63 >50  B Elbow    5.1  6.4  A Elbow B Elbow 1.5 10.0 67 >50  A Elbow    6.6  6.3         Right Ulnar Motor (Abd Dig Minimi)  32C  Wrist    1.6 <3.1 7.1 >7 B Elbow Wrist 3.6 20.0 56 >50  B Elbow    5.2  6.5  A Elbow B Elbow 1.6 10.0 63 >50  A Elbow    6.8  6.2          Comparison Summary Table   Stim Site NR Peak (ms) Norm Peak (ms) P-T Amp (V) Site1 Site2 Delta-P (ms) Norm Delta (ms)  Left Median/Ulnar Palm Comparison (Wrist - 8cm)  32C  Median Palm    1.9 <2.2 73.1 Median Palm Ulnar Palm 0.5   Ulnar Palm    1.4 <2.2 34.3      Right Median/Ulnar Palm Comparison (Wrist -  8cm)  32C  Median Palm    1.6 <2.2 78.6 Median Palm Ulnar Palm 0.2   Ulnar Palm    1.4 <2.2 27.9       EMG   Side Muscle Ins Act Fibs Psw Fasc Number Recrt Dur Dur. Amp Amp. Poly Poly. Comment  Right 1stDorInt Nml Nml Nml Nml Nml Nml Nml Nml Nml Nml Nml Nml N/A  Right PronatorTeres Nml Nml Nml Nml Nml Nml Nml Nml Nml Nml Nml Nml N/A  Right Biceps Nml Nml Nml Nml Nml Nml Nml Nml Nml Nml Nml Nml N/A  Right Triceps Nml Nml Nml Nml Nml Nml Nml Nml Nml Nml Nml Nml N/A  Right Deltoid Nml Nml Nml Nml Nml Nml Nml Nml Nml Nml Nml Nml N/A  Left 1stDorInt Nml Nml Nml Nml Nml Nml Nml Nml Nml Nml Nml Nml N/A  Left Abd Poll Brev Nml Nml Nml Nml Nml Nml Nml Nml Nml Nml Nml Nml N/A  Left PronatorTeres Nml Nml Nml Nml Nml Nml Nml Nml Nml Nml Nml Nml N/A  Left Biceps Nml Nml Nml Nml Nml Nml Nml Nml Nml Nml Nml Nml N/A  Left Triceps Nml Nml Nml Nml Nml Nml Nml Nml Nml Nml Nml Nml N/A  Left Deltoid Nml Nml Nml Nml Nml Nml Nml Nml Nml Nml Nml Nml N/A      Waveforms:

## 2022-02-10 ENCOUNTER — Other Ambulatory Visit (INDEPENDENT_AMBULATORY_CARE_PROVIDER_SITE_OTHER): Payer: Medicare HMO

## 2022-02-10 ENCOUNTER — Ambulatory Visit: Payer: Medicare HMO | Admitting: Family Medicine

## 2022-02-10 DIAGNOSIS — B351 Tinea unguium: Secondary | ICD-10-CM

## 2022-02-10 LAB — COMPREHENSIVE METABOLIC PANEL
ALT: 21 U/L (ref 0–35)
AST: 21 U/L (ref 0–37)
Albumin: 4.1 g/dL (ref 3.5–5.2)
Alkaline Phosphatase: 87 U/L (ref 39–117)
BUN: 15 mg/dL (ref 6–23)
CO2: 31 mEq/L (ref 19–32)
Calcium: 9.6 mg/dL (ref 8.4–10.5)
Chloride: 101 mEq/L (ref 96–112)
Creatinine, Ser: 0.64 mg/dL (ref 0.40–1.20)
GFR: 83.39 mL/min (ref >60.00)
Glucose, Bld: 89 mg/dL (ref 70–99)
Potassium: 4.5 mEq/L (ref 3.5–5.1)
Sodium: 141 mEq/L (ref 135–145)
Total Bilirubin: 0.6 mg/dL (ref 0.2–1.2)
Total Protein: 7.2 g/dL (ref 6.0–8.3)

## 2022-02-24 ENCOUNTER — Ambulatory Visit: Payer: Medicare HMO | Attending: Neurology | Admitting: Physical Therapy

## 2022-02-24 ENCOUNTER — Encounter: Payer: Self-pay | Admitting: Physical Therapy

## 2022-02-24 ENCOUNTER — Other Ambulatory Visit: Payer: Self-pay

## 2022-02-24 DIAGNOSIS — G5602 Carpal tunnel syndrome, left upper limb: Secondary | ICD-10-CM | POA: Diagnosis not present

## 2022-02-24 DIAGNOSIS — M542 Cervicalgia: Secondary | ICD-10-CM | POA: Diagnosis not present

## 2022-02-24 DIAGNOSIS — M546 Pain in thoracic spine: Secondary | ICD-10-CM | POA: Diagnosis not present

## 2022-02-24 DIAGNOSIS — R252 Cramp and spasm: Secondary | ICD-10-CM

## 2022-02-24 DIAGNOSIS — R42 Dizziness and giddiness: Secondary | ICD-10-CM

## 2022-02-24 NOTE — Therapy (Signed)
[facility] ?[address] ?[place], [place], [address] ?Phone: [phone]   Fax:  [phone] ? ?Physical Therapy Evaluation ? ?Patient Details  ?Name: Cindy Velasquez ?MRN: [id number] ?Date of Birth: 30-Nov-1941 ?Referring Provider (PT): Narda Amber ? ? ?Encounter Date: 02/24/2022 ? ? PT End of Session - 02/24/22 1710   ? ? Visit Number 1   ? Number of Visits 13   ? Date for PT Re-Evaluation 05/27/22   ? Authorization Type Hum[employer]   ? PT Start Time 1608   ? PT Stop Time 1700   ? PT Time Calculation (min) 52 min   ? Activity Tolerance Patient tolerated treatment well   ? Behavior During Therapy [facility] for tasks assessed/performed   ? ?  ?  ? ?  ? ? ?Past Medical History:  ?Diagnosis Date  ? Adenomatous colon polyp   ? Cancer ([place]) 03/2015  ? BCC R tibia   ? Diverticulosis of colon   ? GERD (gastroesophageal reflux disease)   ? Hip pain   ? Hyperlipidemia   ? Hyperplastic colon polyp   ? Hypertension   ? Lactose intolerance   ? Loose stools   ? Lower back pain   ? Osteoporosis   ? Palpitations   ? ? ?Past Surgical History:  ?Procedure Laterality Date  ? EYE SURGERY  2012  ? B/L 01/2011  02/2011  ? HAMMER TOE SURGERY    ? Bilateral  ? ROTATOR CUFF REPAIR    ? Left  ? TUBAL LIGATION    ? ? ?There were no vitals filed for this visit. ? ? ? Subjective Assessment - 02/24/22 1611   ? ? Subjective Patient reports that she has had issues with her neck for a number of years, stenosis and DDD, she reports that recently she started having more issues with numbness and tingling in the hands, about 5 months ago.   and NCVT showed some mild left CTS.  She reports that she has tightness in the neck.   ? Limitations Sitting;Reading;House hold activities   ? Patient Stated Goals have less pain, less numbness   ? Currently in Pain? Yes   ? Pain Score 7    ? Pain Location Neck   ? Pain Descriptors / Indicators Aching;Spasm;Tightness   ? Pain Type Chronic pain   ? Pain Radiating Towards does  have some numbness in both hands   ? Pain Onset More than a month ago   ? Pain Frequency Constant   ? Aggravating Factors  sitting, reading, driving, pain up to 6-6/06   ? Pain Relieving Factors pain medication, pain patches pain can be 3/10   ? Effect of Pain on Daily Activities reports difficulty playing cards, just always hurting   ? ?  ?  ? ?  ? ? ? ? ? [facility] PT Assessment - 02/24/22 0001   ? ?  ? Assessment  ? Medical Diagnosis cervicalgia   ? Referring Provider (PT) Narda Amber   ? Onset Date/Surgical Date 01/27/22   ? Hand Dominance Right   ?  ? Precautions  ? Precautions None   ?  ? Balance Screen  ? Has the patient fallen in the past 6 months No   ? Has the patient had a decrease in activity level because of a fear of falling?  No   ? Is the patient reluctant to leave their home because of a fear of falling?  No   ?  ?  Home Environment  ? Additional Comments some housework   ?  ? Prior Function  ? Level of Independence Independent   ? Vocation Retired   ? [facility], balance and movement and a chair yoga class   ?  ? Posture/Postural Control  ? Posture Comments fwd head, rounded shoulders   ?  ? ROM / Strength  ? AROM / PROM / Strength AROM;Strength   ?  ? AROM  ? Overall AROM Comments Shoulder ROM is limited in all motions c/o tightness, some discomfort with flexion, ER and IR, cervcial ROM decreased 75% for flexion, extension and side bending, decreased 50% for rotaiton   ?  ? Strength  ? Overall Strength Comments shoulder strength 4-/5, grip strength right 45#, left 353   ?  ? Flexibility  ? Soft Tissue Assessment /Muscle Length --   tight median nerve right > left  ?  ? Palpation  ? Palpation comment she is very tight in the upper traps, the rhomboids and the cervical area   ?  ? Special Tests  ? Other special tests some tightness of the median nerve R> L   ? ?  ?  ? ?  ? ? ? ? ? ? ? ? ? Vestibular Assessment - 02/24/22 0001   ? ?  ? Symptom Behavior  ? Subjective history of current  problem occasional dizziness 1x/week   ? Type of Dizziness  Lightheadedness;"Funny feeling in head"   ? Duration of Dizziness 1-2 minutes   ? Aggravating Factors --   unsure, vague, "maybe movements"  ?  ? Oculomotor Exam  ? Gaze-induced  Direction changing nystagmus;Age appropriate nystagmus at end range   ? Smooth Pursuits Saccades   ? Saccades Hypometric;Overshoots   ? ?  ?  ? ?  ? ? ? ? ? ?Objective measurements completed on examination: See above findings.  ? ? ? ? ? ? ? ? ? ? ? ? ? ? PT Education - 02/24/22 1709   ? ? Education Details HEP for low functioning VOR   ? Person(s) Educated Patient   ? Methods Explanation;Demonstration;Verbal cues;Handout   ? Comprehension Verbal cues required;Returned demonstration;Verbalized understanding;Need further instruction   ? ?  ?  ? ?  ? ? ? PT Short Term Goals - 02/24/22 1829   ? ?  ? PT SHORT TERM GOAL #1  ? Title independent with initial HEP   ? Time 2   ? Period Weeks   ? Status New   ? ?  ?  ? ?  ? ? ? ? PT Long Term Goals - 02/24/22 1829   ? ?  ? PT LONG TERM GOAL #1  ? Title increase cervical ROM 25% for easier backing up of the car   ? Time 12   ? Period Weeks   ? Status New   ?  ? PT LONG TERM GOAL #2  ? Title indepednent with posture and body mechanics   ? Time 12   ? Period Weeks   ? Status New   ?  ? PT LONG TERM GOAL #3  ? Title decrease pain 50%   ? Time 12   ? Period Weeks   ? Status New   ?  ? PT LONG TERM GOAL #4  ? Title report no difficulty shuffling cards   ? Time 12   ? Period Weeks   ? Status New   ? ?  ?  ? ?  ? ? ? ? ? ? ? ? ?  Plan - 02/24/22 1710   ? ? Clinical Impression Statement Patient reports that she has had some neck pain for a number of years, reports that over the past 6 months she has had some numbness, prompting her to go to the MD, MD did a NCVT and shows some mild CTS symptoms, she hd xrays about 4-5 years ago that showed DDD and stenosis, she has very limited cervical ROM, she has tightness and tenderness in the cervical, rhomboids,  upper traps.  She has limited ROM of the shoulders at the end range. She mentioned to me about being dizzy at times with looking, I did some VOR testing and she had nystagmus at end range smooth pursuits, difficulty with saccades and diffiuclty with gaze, this could be the reason for the dizziness   ? Stability/Clinical Decision Making Evolving/Moderate complexity   ? Clinical Decision Making Moderate   ? Rehab Potential Good   ? PT Frequency 1x / week   ? PT Duration 12 weeks   ? PT Treatment/Interventions ADLs/Self Care Home Management;Electrical Stimulation;Traction;Therapeutic activities;Therapeutic exercise;Neuromuscular re-education;Patient/family education;Manual techniques;Dry needling   ? PT Next Visit Plan she will be out of town and when she returns will work on the spasms and pain   ? Consulted and Agree with Plan of Care Patient   ? ?  ?  ? ?  ? ? ?Patient will benefit from skilled therapeutic intervention in order to improve the following deficits and impairments:  Decreased range of motion, Increased muscle spasms, Decreased activity tolerance, Pain, Improper body mechanics, Dizziness, Decreased strength, Postural dysfunction ? ?Visit Diagnosis: ?Cervicalgia - Plan: PT plan of care cert/re-cert ? ?Pain in thoracic spine - Plan: PT plan of care cert/re-cert ? ?Cramp and spasm - Plan: PT plan of care cert/re-cert ? ?Dizziness and giddiness - Plan: PT plan of care cert/re-cert ? ? ? ? ?Problem List ?Patient Active Problem List  ? Diagnosis Date Noted  ? Onychomycosis 12/31/2021  ? Burn of left arm, first degree, initial encounter 10/10/2021  ? Wound of left leg 10/10/2021  ? Hand abrasion, infected, left, initial encounter 06/05/2021  ? Thoracic stenosis 07/27/2020  ? Dyslipidemia 07/27/2020  ? Prediabetes 12/13/2018  ? Vitamin D deficiency 12/13/2018  ? Overweight (BMI 25.0-29.9) 12/13/2018  ? Preventative health care 09/15/2018  ? Bilateral cold feet 06/17/2018  ? Idiopathic peripheral neuropathy  06/17/2018  ? Lower extremity edema 06/17/2018  ? Right shoulder pain 08/20/2016  ? Knee pain, bilateral 04/23/2015  ? Urticaria 10/13/2014  ? Neuropathy 10/13/2014  ? Fatigue 10/13/2014  ? Abrasion of face 0

## 2022-03-14 ENCOUNTER — Ambulatory Visit: Payer: Medicare HMO | Attending: Neurology | Admitting: Physical Therapy

## 2022-03-14 ENCOUNTER — Encounter: Payer: Self-pay | Admitting: Physical Therapy

## 2022-03-14 DIAGNOSIS — M546 Pain in thoracic spine: Secondary | ICD-10-CM | POA: Diagnosis not present

## 2022-03-14 DIAGNOSIS — R42 Dizziness and giddiness: Secondary | ICD-10-CM | POA: Insufficient documentation

## 2022-03-14 DIAGNOSIS — M542 Cervicalgia: Secondary | ICD-10-CM | POA: Diagnosis not present

## 2022-03-14 DIAGNOSIS — M545 Low back pain, unspecified: Secondary | ICD-10-CM | POA: Diagnosis not present

## 2022-03-14 DIAGNOSIS — R252 Cramp and spasm: Secondary | ICD-10-CM | POA: Diagnosis not present

## 2022-03-14 NOTE — Therapy (Signed)
[facility] ?[address] [place] ?[address] ?[place], [place], [address] ?Phone: [phone]   Fax:  [phone] ? ?Physical Therapy Treatment ? ?Patient Details  ?Name: Cindy Velasquez ?MRN: [id number] ?Date of Birth: [birth year] ?Referring Provider (PT): Narda Amber ? ? ?Encounter Date: 03/14/2022 ? ? PT End of Session - 03/14/22 6160   ? ? Visit Number 2   ? Number of Visits 13   ? Date for PT Re-Evaluation 05/27/22   ? Authorization Type Humana   ? PT Start Time [phone]   ? PT Stop Time 0626   ? PT Time Calculation (min) 58 min   ? Activity Tolerance Patient tolerated treatment well   ? Behavior During Therapy [facility] for tasks assessed/performed   ? ?  ?  ? ?  ? ? ?Past Medical History:  ?Diagnosis Date  ? Adenomatous colon polyp   ? Cancer ([place]) 03/2015  ? BCC R tibia   ? Diverticulosis of colon   ? GERD (gastroesophageal reflux disease)   ? Hip pain   ? Hyperlipidemia   ? Hyperplastic colon polyp   ? Hypertension   ? Lactose intolerance   ? Loose stools   ? Lower back pain   ? Osteoporosis   ? Palpitations   ? ? ?Past Surgical History:  ?Procedure Laterality Date  ? EYE SURGERY  2012  ? B/L 01/2011  02/2011  ? HAMMER TOE SURGERY    ? Bilateral  ? ROTATOR CUFF REPAIR    ? Left  ? TUBAL LIGATION    ? ? ?There were no vitals filed for this visit. ? ? Subjective Assessment - 03/14/22 0837   ? ? Subjective My dizziness is better.  We drove 4 hours one way and back did okay on the drive, i use the [facility]   ? Currently in Pain? Yes   ? Pain Score 7    ? Pain Location Neck   ? Pain Orientation Right   ? Pain Descriptors / Indicators Spasm;Tightness   ? Aggravating Factors  sitting   ? Pain Relieving Factors salon [facility]   ? ?  ?  ? ?  ? ? ? ? ? ? ? ? ? ? ? ? ? ? ? ? ? ? ? ? [facility] Adult PT Treatment/Exercise - 03/14/22 0001   ? ?  ? Exercises  ? Exercises Neck   ?  ? Neck Exercises: Machines for Strengthening  ? UBE (Upper Arm Bike) Level 1 x 4 minutes   ?  ? Neck Exercises: Standing  ? Other  Standing Exercises red tband rows, extension and ER 2x10 each   ? Other Standing Exercises 4# shrugs with upper trap and levator stetches   ?  ? Modalities  ? Modalities Electrical Stimulation;Moist Heat   ?  ? Moist Heat Therapy  ? Number Minutes Moist Heat 10 Minutes   ? Moist Heat Location Cervical   ?  ? Electrical Stimulation  ? Electrical Stimulation Location C/T area   ? Electrical Stimulation Action IFC   ? Electrical Stimulation Parameters supine   ? Electrical Stimulation Goals Pain   ?  ? Manual Therapy  ? Manual Therapy Soft tissue mobilization   ? Soft tissue mobilization bilateral upper traps and cervical pspinals   ? ?  ?  ? ?  ? ? ? Trigger Point Dry Needling - 03/14/22 0001   ? ? Consent Given? Yes   ? Education Handout Provided Yes   ?  Muscles Treated Head and Neck Upper trapezius   ? Upper Trapezius Response Twitch reponse elicited   ? ?  ?  ? ?  ? ? ? ? ? ? ? ? ? ? PT Short Term Goals - 03/14/22 0925   ? ?  ? PT SHORT TERM GOAL #1  ? Title independent with initial HEP   ? Status Partially Met   ? ?  ?  ? ?  ? ? ? ? PT Long Term Goals - 03/14/22 0925   ? ?  ? PT LONG TERM GOAL #1  ? Title increase cervical ROM 25% for easier backing up of the car   ? Status On-going   ?  ? PT LONG TERM GOAL #2  ? Title indepednent with posture and body mechanics   ? Status On-going   ? ?  ?  ? ?  ? ? ? ? ? ? ? ? Plan - 03/14/22 0923   ? ? Clinical Impression Statement Reviewed the VOR HEP, no quesitons, talked with her about doing the tband rows, extension and ER at home as part of HEP, she reported she understood, I added DN and had large LTR's in both traps, she is very tight in the neck and upper traps,  Education about the importance of posture, she does report being on her phone too much   ? PT Next Visit Plan see how she does with the DN   ? Consulted and Agree with Plan of Care Patient   ? ?  ?  ? ?  ? ? ?Patient will benefit from skilled therapeutic intervention in order to improve the following deficits  and impairments:  Decreased range of motion, Increased muscle spasms, Decreased activity tolerance, Pain, Improper body mechanics, Dizziness, Decreased strength, Postural dysfunction ? ?Visit Diagnosis: ?Cervicalgia ? ?Pain in thoracic spine ? ?Cramp and spasm ? ?Dizziness and giddiness ? ? ? ? ?Problem List ?Patient Active Problem List  ? Diagnosis Date Noted  ? Onychomycosis 12/31/2021  ? Burn of left arm, first degree, initial encounter 10/10/2021  ? Wound of left leg 10/10/2021  ? Hand abrasion, infected, left, initial encounter 06/05/2021  ? Thoracic stenosis 07/27/2020  ? Dyslipidemia 07/27/2020  ? Prediabetes 12/13/2018  ? Vitamin D deficiency 12/13/2018  ? Overweight (BMI 25.0-29.9) 12/13/2018  ? Preventative health care 09/15/2018  ? Bilateral cold feet 06/17/2018  ? Idiopathic peripheral neuropathy 06/17/2018  ? Lower extremity edema 06/17/2018  ? Right shoulder pain 08/20/2016  ? Knee pain, bilateral 04/23/2015  ? Urticaria 10/13/2014  ? Neuropathy 10/13/2014  ? Fatigue 10/13/2014  ? Abrasion of face 08/01/2013  ? Rib pain on left side 07/25/2013  ? Tinea cruris 06/22/2012  ? Eustachian tube dysfunction 04/07/2012  ? Cerumen impaction 04/07/2012  ? URI 10/30/2010  ? CHANGE IN BOWELS 04/03/2010  ? FECAL OCCULT BLOOD 04/03/2010  ? PERSONAL HX COLONIC POLYPS 04/03/2010  ? DYSPEPSIA 03/04/2010  ? GUAIAC POSITIVE STOOL 03/04/2010  ? HEMATURIA UNSPECIFIED 06/28/2009  ? DYSURIA 06/28/2009  ? HAMMER TOE 12/28/2008  ? POSTMENOPAUSAL STATUS 12/28/2008  ? BACK PAIN, LUMBAR 07/11/2008  ? BENIGN NEOPLASM OF SKIN SITE UNSPECIFIED 02/28/2008  ? Hyperlipidemia 04/15/2007  ? Essential hypertension 04/15/2007  ? DIVERTICULOSIS, COLON 04/15/2007  ? THUMB PAIN, RIGHT 04/15/2007  ? OSTEOPOROSIS 04/15/2007  ? ? Sumner Boast, PT ?03/14/2022, 9:26 AM ? ?[facility] ?[place] ?[address] ?[place], [place], [address] ?Phone: [phone]   Fax:  [phone] ? ?Name: Cindy Velasquez ?MRN: [id number] ?Date of Birth: Apr 03, 1941 ? ? ? ?

## 2022-03-19 ENCOUNTER — Encounter: Payer: Self-pay | Admitting: Physical Therapy

## 2022-03-19 ENCOUNTER — Ambulatory Visit: Payer: Medicare HMO | Admitting: Physical Therapy

## 2022-03-19 DIAGNOSIS — M546 Pain in thoracic spine: Secondary | ICD-10-CM | POA: Diagnosis not present

## 2022-03-19 DIAGNOSIS — M542 Cervicalgia: Secondary | ICD-10-CM | POA: Diagnosis not present

## 2022-03-19 DIAGNOSIS — R42 Dizziness and giddiness: Secondary | ICD-10-CM | POA: Diagnosis not present

## 2022-03-19 DIAGNOSIS — R252 Cramp and spasm: Secondary | ICD-10-CM

## 2022-03-19 DIAGNOSIS — M545 Low back pain, unspecified: Secondary | ICD-10-CM | POA: Diagnosis not present

## 2022-03-19 NOTE — Therapy (Signed)
[facility] ?[place] ?[address] ?[place], [place], [address] ?Phone: [phone]   Fax:  [phone] ? ?Physical Therapy Treatment ? ?Patient Details  ?Name: Cindy Velasquez ?MRN: [id number] ?Date of Birth: [birth year] ?Referring Provider (PT): Narda Amber ? ? ?Encounter Date: 03/19/2022 ? ? PT End of Session - 03/19/22 1140   ? ? Visit Number 3   ? Date for PT Re-Evaluation 05/27/22   ? Authorization Type Humana   ? PT Start Time 1100   ? PT Stop Time 1145   ? PT Time Calculation (min) 45 min   ? Activity Tolerance Patient tolerated treatment well   ? Behavior During Therapy [facility] for tasks assessed/performed   ? ?  ?  ? ?  ? ? ?Past Medical History:  ?Diagnosis Date  ? Adenomatous colon polyp   ? Cancer ([place]) 03/2015  ? BCC R tibia   ? Diverticulosis of colon   ? GERD (gastroesophageal reflux disease)   ? Hip pain   ? Hyperlipidemia   ? Hyperplastic colon polyp   ? Hypertension   ? Lactose intolerance   ? Loose stools   ? Lower back pain   ? Osteoporosis   ? Palpitations   ? ? ?Past Surgical History:  ?Procedure Laterality Date  ? EYE SURGERY  2012  ? B/L 01/2011  02/2011  ? HAMMER TOE SURGERY    ? Bilateral  ? ROTATOR CUFF REPAIR    ? Left  ? TUBAL LIGATION    ? ? ?There were no vitals filed for this visit. ? ? Subjective Assessment - 03/19/22 1107   ? ? Subjective Doing ok, the DN did help   ? Currently in Pain? No/denies   ? ?  ?  ? ?  ? ? ? ? ? ? ? ? ? ? ? ? ? ? ? ? ? ? ? ? [facility] Adult PT Treatment/Exercise - 03/19/22 0001   ? ?  ? Neck Exercises: Machines for Strengthening  ? Nustep L4 x 6 min   ?  ? Neck Exercises: Standing  ? Other Standing Exercises red tband rows, extension and ER 2x10 each   ? Other Standing Exercises 4# shrugs with upper trap and levator stetches   ?  ? Modalities  ? Modalities Electrical Stimulation;Moist Heat   ?  ? Moist Heat Therapy  ? Number Minutes Moist Heat 10 Minutes   ? Moist Heat Location Cervical   ?  ? Electrical Stimulation  ? Electrical  Stimulation Location C/T area   ? Electrical Stimulation Action IFC   ? Electrical Stimulation Parameters supine   ? Electrical Stimulation Goals Pain   ?  ? Manual Therapy  ? Manual Therapy Soft tissue mobilization   ? Soft tissue mobilization bilateral upper traps and cervical pspinals   ? ?  ?  ? ?  ? ? ? Trigger Point Dry Needling - 03/19/22 0001   ? ? Consent Given? Yes   ? Upper Trapezius Response Twitch reponse elicited   ? ?  ?  ? ?  ? ? ? ? ? ? ? ? ? ? PT Short Term Goals - 03/19/22 1143   ? ?  ? PT SHORT TERM GOAL #1  ? Title independent with initial HEP   ? Status Achieved   ? ?  ?  ? ?  ? ? ? ? PT Long Term Goals - 03/19/22 1143   ? ?  ? PT LONG TERM  GOAL #1  ? Title increase cervical ROM 25% for easier backing up of the car   ? Status On-going   ?  ? PT LONG TERM GOAL #2  ? Title indepednent with posture and body mechanics   ? Status Partially Met   ? ?  ?  ? ?  ? ? ? ? ? ? ? ? Plan - 03/19/22 1140   ? ? Clinical Impression Statement Pt enters clinic feeling well reporting no pain. She stated that DN really helped and wanted to try it again. Lead PT assisted in treatment providing DN. Pt did well completing all exercise interventions. Tactile cues for posture needed with shoulder extension and rows. She did reports a crunching sensation with shrugs. Modalities to help with possible DN soreness.   ? Stability/Clinical Decision Making Evolving/Moderate complexity   ? Rehab Potential Good   ? PT Frequency 1x / week   ? PT Duration 12 weeks   ? PT Treatment/Interventions ADLs/Self Care Home Management;Electrical Stimulation;Traction;Therapeutic activities;Therapeutic exercise;Neuromuscular re-education;Patient/family education;Manual techniques;Dry needling   ? PT Next Visit Plan Assess Tx, progress as tolerated.   ? ?  ?  ? ?  ? ? ?Patient will benefit from skilled therapeutic intervention in order to improve the following deficits and impairments:  Decreased range of motion, Increased muscle spasms,  Decreased activity tolerance, Pain, Improper body mechanics, Dizziness, Decreased strength, Postural dysfunction ? ?Visit Diagnosis: ?Cervicalgia ? ?Pain in thoracic spine ? ?Cramp and spasm ? ? ? ? ?Problem List ?Patient Active Problem List  ? Diagnosis Date Noted  ? Onychomycosis 12/31/2021  ? Burn of left arm, first degree, initial encounter 10/10/2021  ? Wound of left leg 10/10/2021  ? Hand abrasion, infected, left, initial encounter 06/05/2021  ? Thoracic stenosis 07/27/2020  ? Dyslipidemia 07/27/2020  ? Prediabetes 12/13/2018  ? Vitamin D deficiency 12/13/2018  ? Overweight (BMI 25.0-29.9) 12/13/2018  ? Preventative health care 09/15/2018  ? Bilateral cold feet 06/17/2018  ? Idiopathic peripheral neuropathy 06/17/2018  ? Lower extremity edema 06/17/2018  ? Right shoulder pain 08/20/2016  ? Knee pain, bilateral 04/23/2015  ? Urticaria 10/13/2014  ? Neuropathy 10/13/2014  ? Fatigue 10/13/2014  ? Abrasion of face 08/01/2013  ? Rib pain on left side 07/25/2013  ? Tinea cruris 06/22/2012  ? Eustachian tube dysfunction 04/07/2012  ? Cerumen impaction 04/07/2012  ? URI 10/30/2010  ? CHANGE IN BOWELS 04/03/2010  ? FECAL OCCULT BLOOD 04/03/2010  ? PERSONAL HX COLONIC POLYPS 04/03/2010  ? DYSPEPSIA 03/04/2010  ? GUAIAC POSITIVE STOOL 03/04/2010  ? HEMATURIA UNSPECIFIED 06/28/2009  ? DYSURIA 06/28/2009  ? HAMMER TOE 12/28/2008  ? POSTMENOPAUSAL STATUS 12/28/2008  ? BACK PAIN, LUMBAR 07/11/2008  ? BENIGN NEOPLASM OF SKIN SITE UNSPECIFIED 02/28/2008  ? Hyperlipidemia 04/15/2007  ? Essential hypertension 04/15/2007  ? DIVERTICULOSIS, COLON 04/15/2007  ? THUMB PAIN, RIGHT 04/15/2007  ? OSTEOPOROSIS 04/15/2007  ? ? ?Scot Jun, PTA ?03/19/2022, 11:45 AM ? ?[place] ?[address] ?[address] ?[place], [place], [address] ?Phone: [phone]   Fax:  [phone] ? ?Name: Cindy Velasquez ?MRN: [id number] ?Date of Birth: August 18, 1941 ? ? ? ?

## 2022-03-21 ENCOUNTER — Encounter: Payer: Self-pay | Admitting: Physical Therapy

## 2022-03-21 ENCOUNTER — Ambulatory Visit: Payer: Medicare HMO | Admitting: Physical Therapy

## 2022-03-21 DIAGNOSIS — M546 Pain in thoracic spine: Secondary | ICD-10-CM | POA: Diagnosis not present

## 2022-03-21 DIAGNOSIS — R42 Dizziness and giddiness: Secondary | ICD-10-CM | POA: Diagnosis not present

## 2022-03-21 DIAGNOSIS — R252 Cramp and spasm: Secondary | ICD-10-CM

## 2022-03-21 DIAGNOSIS — M542 Cervicalgia: Secondary | ICD-10-CM | POA: Diagnosis not present

## 2022-03-21 DIAGNOSIS — M545 Low back pain, unspecified: Secondary | ICD-10-CM | POA: Diagnosis not present

## 2022-03-21 NOTE — Therapy (Signed)
[name] ?[place], [place], [address] ?Phone: [phone]   Fax:  [phone] ? ?Physical Therapy Treatment ? ?Patient Details  ?Name: Cindy Velasquez ?MRN: [id number] ?Date of Birth: 1941-05-07 ?Referring Provider (PT): Narda Amber ? ? ?Encounter Date: 03/21/2022 ? ? PT End of Session - 03/21/22 1136   ? ? Visit Number 4   ? Date for PT Re-Evaluation 05/27/22   ? Authorization Type [employer]   ? PT Start Time 1055   ? PT Stop Time 1145   ? PT Time Calculation (min) 50 min   ? Activity Tolerance Patient tolerated treatment well   ? Behavior During Therapy [facility] for tasks assessed/performed   ? ?  ?  ? ?  ? ? ?Past Medical History:  ?Diagnosis Date  ? Adenomatous colon polyp   ? Cancer ([place]) 03/2015  ? BCC R tibia   ? Diverticulosis of colon   ? GERD (gastroesophageal reflux disease)   ? Hip pain   ? Hyperlipidemia   ? Hyperplastic colon polyp   ? Hypertension   ? Lactose intolerance   ? Loose stools   ? Lower back pain   ? Osteoporosis   ? Palpitations   ? ? ?Past Surgical History:  ?Procedure Laterality Date  ? EYE SURGERY  2012  ? B/L 01/2011  02/2011  ? HAMMER TOE SURGERY    ? Bilateral  ? ROTATOR CUFF REPAIR    ? Left  ? TUBAL LIGATION    ? ? ?There were no vitals filed for this visit. ? ? Subjective Assessment - 03/21/22 1057   ? ? Subjective Doing pretty good today   ? Currently in Pain? No/denies   ? ?  ?  ? ?  ? ? ? ? ? ? ? ? ? ? ? ? ? ? ? ? ? ? ? ? [facility] [place] [facility] Adult PT Treatment/Exercise - 03/21/22 0001   ? ?  ? Neck Exercises: Machines for Strengthening  ? Nustep L4 x 6 min   ? Power Tower Rows 15lb s 20 2x10   ?  ? Neck Exercises: Standing  ? Other Standing Exercises ER red, horiz abd red 2x10   ?  ? Moist Heat Therapy  ? Number Minutes Moist Heat 10 Minutes   ? Moist Heat Location Cervical   ?  ? Electrical Stimulation  ? Electrical Stimulation Location C/T area   ? Electrical Stimulation Action IFC   ? Electrical Stimulation Parameters supine   ?  Electrical Stimulation Goals Pain   ?  ? Manual Therapy  ? Manual Therapy Soft tissue mobilization;Passive ROM   ? Soft tissue mobilization bilateral upper traps and cervical pspinals   ? Passive ROM cervical spine with end range holds   ? ?  ?  ? ?  ? ? ? ? ? ? ? ? ? ? ? ? PT Short Term Goals - 03/19/22 1143   ? ?  ? PT SHORT TERM GOAL #1  ? Title independent with initial HEP   ? Status Achieved   ? ?  ?  ? ?  ? ? ? ? PT Long Term Goals - 03/21/22 1137   ? ?  ? PT LONG TERM GOAL #1  ? Title increase cervical ROM 25% for easier backing up of the car   ? Status On-going   ? ?  ?  ? ?  ? ? ? ? ? ? ? ? Plan - 03/21/22  1137   ? ? Clinical Impression Statement Pt enters reporting an overall improvement with her neck pain but still has some numbness in he LUE. Added more scapular stabilization interventions without issue Tactile cue to both elbows with ER to maintain proper positioning. som cervical stiffness present with PROM. Modalities for pain.   ? Stability/Clinical Decision Making Evolving/Moderate complexity   ? Rehab Potential Good   ? PT Frequency 1x / week   ? PT Duration 12 weeks   ? PT Treatment/Interventions ADLs/Self Care Home Management;Electrical Stimulation;Traction;Therapeutic activities;Therapeutic exercise;Neuromuscular re-education;Patient/family education;Manual techniques;Dry needling   ? PT Next Visit Plan Assess Tx, progress as tolerated.   ? ?  ?  ? ?  ? ? ?Patient will benefit from skilled therapeutic intervention in order to improve the following deficits and impairments:  Decreased range of motion, Increased muscle spasms, Decreased activity tolerance, Pain, Improper body mechanics, Dizziness, Decreased strength, Postural dysfunction ? ?Visit Diagnosis: ?Cervicalgia ? ?Cramp and spasm ? ?Pain in thoracic spine ? ? ? ? ?Problem List ?Patient Active Problem List  ? Diagnosis Date Noted  ? Onychomycosis 12/31/2021  ? Burn of left arm, first degree, initial encounter 10/10/2021  ? Wound of left  leg 10/10/2021  ? Hand abrasion, infected, left, initial encounter 06/05/2021  ? Thoracic stenosis 07/27/2020  ? Dyslipidemia 07/27/2020  ? Prediabetes 12/13/2018  ? Vitamin D deficiency 12/13/2018  ? Overweight (BMI 25.0-29.9) 12/13/2018  ? Preventative health care 09/15/2018  ? Bilateral cold feet 06/17/2018  ? Idiopathic peripheral neuropathy 06/17/2018  ? Lower extremity edema 06/17/2018  ? Right shoulder pain 08/20/2016  ? Knee pain, bilateral 04/23/2015  ? Urticaria 10/13/2014  ? Neuropathy 10/13/2014  ? Fatigue 10/13/2014  ? Abrasion of face 08/01/2013  ? Rib pain on left side 07/25/2013  ? Tinea cruris 06/22/2012  ? Eustachian tube dysfunction 04/07/2012  ? Cerumen impaction 04/07/2012  ? URI 10/30/2010  ? CHANGE IN BOWELS 04/03/2010  ? FECAL OCCULT BLOOD 04/03/2010  ? PERSONAL HX COLONIC POLYPS 04/03/2010  ? DYSPEPSIA 03/04/2010  ? GUAIAC POSITIVE STOOL 03/04/2010  ? HEMATURIA UNSPECIFIED 06/28/2009  ? DYSURIA 06/28/2009  ? HAMMER TOE 12/28/2008  ? POSTMENOPAUSAL STATUS 12/28/2008  ? BACK PAIN, LUMBAR 07/11/2008  ? BENIGN NEOPLASM OF SKIN SITE UNSPECIFIED 02/28/2008  ? Hyperlipidemia 04/15/2007  ? Essential hypertension 04/15/2007  ? DIVERTICULOSIS, COLON 04/15/2007  ? THUMB PAIN, RIGHT 04/15/2007  ? OSTEOPOROSIS 04/15/2007  ? ? ?Scot Jun, PTA ?03/21/2022, 11:39 AM ? ?[facility] ?[place] ?[address] ?[place], [place], [address] ?Phone: [phone]   Fax:  [phone] ? ?Name: Cindy Velasquez ?MRN: [id number] ?Date of Birth: Jul 13, 1941 ? ? ? ?

## 2022-03-25 ENCOUNTER — Encounter: Payer: Self-pay | Admitting: Physical Therapy

## 2022-03-25 ENCOUNTER — Ambulatory Visit: Payer: Medicare HMO | Admitting: Physical Therapy

## 2022-03-25 DIAGNOSIS — M542 Cervicalgia: Secondary | ICD-10-CM

## 2022-03-25 DIAGNOSIS — R42 Dizziness and giddiness: Secondary | ICD-10-CM | POA: Diagnosis not present

## 2022-03-25 DIAGNOSIS — R252 Cramp and spasm: Secondary | ICD-10-CM | POA: Diagnosis not present

## 2022-03-25 DIAGNOSIS — M546 Pain in thoracic spine: Secondary | ICD-10-CM

## 2022-03-25 DIAGNOSIS — M545 Low back pain, unspecified: Secondary | ICD-10-CM | POA: Diagnosis not present

## 2022-03-25 NOTE — Therapy (Signed)
[facility] ?[place] ?[address] ?[place], [place], [address] ?Phone: [phone]   Fax:  [phone] ? ?Physical Therapy Treatment ? ?Patient Details  ?Name: Cindy Velasquez ?MRN: [id number] ?Date of Birth: September 05, 1941 ?Referring Provider (PT): Narda Amber ? ? ?Encounter Date: 03/25/2022 ? ? PT End of Session - 03/25/22 1434   ? ? Visit Number 5   ? Number of Visits 13   ? Date for PT Re-Evaluation 05/27/22   ? Authorization Type Humana   ? PT Start Time 1350   ? PT Stop Time 1324   ? PT Time Calculation (min) 58 min   ? Activity Tolerance Patient tolerated treatment well   ? Behavior During Therapy [facility] for tasks assessed/performed   ? ?  ?  ? ?  ? ? ?Past Medical History:  ?Diagnosis Date  ? Adenomatous colon polyp   ? Cancer ([place]) 03/2015  ? BCC R tibia   ? Diverticulosis of colon   ? GERD (gastroesophageal reflux disease)   ? Hip pain   ? Hyperlipidemia   ? Hyperplastic colon polyp   ? Hypertension   ? Lactose intolerance   ? Loose stools   ? Lower back pain   ? Osteoporosis   ? Palpitations   ? ? ?Past Surgical History:  ?Procedure Laterality Date  ? EYE SURGERY  2012  ? B/L 01/2011  02/2011  ? HAMMER TOE SURGERY    ? Bilateral  ? ROTATOR CUFF REPAIR    ? Left  ? TUBAL LIGATION    ? ? ?There were no vitals filed for this visit. ? ? Subjective Assessment - 03/25/22 1356   ? ? Subjective I think it is helping.  Less pain   ? Currently in Pain? Yes   ? Pain Score 2    ? Pain Location Neck   ? Pain Relieving Factors the needling helps   ? ?  ?  ? ?  ? ? ? ? ? ? ? ? ? ? ? ? ? ? ? ? ? ? ? ? [place] Adult PT Treatment/Exercise - 03/25/22 0001   ? ?  ? Neck Exercises: Machines for Strengthening  ? UBE (Upper Arm Bike) Level  x 4 minutes   ?  ? Neck Exercises: Standing  ? Other Standing Exercises ER red, horiz abd red 2x10   ? Other Standing Exercises 4# shrugs with upper trap and levator stetches   ?  ? Moist Heat Therapy  ? Number Minutes Moist Heat 10 Minutes   ? Moist Heat Location  Cervical   ?  ? Electrical Stimulation  ? Electrical Stimulation Location C/T area   ? Electrical Stimulation Action IFC   ? Electrical Stimulation Parameters supine   ? Electrical Stimulation Goals Pain   ?  ? Manual Therapy  ? Manual Therapy Soft tissue mobilization;Passive ROM   ? Soft tissue mobilization bilateral upper traps and cervical pspinals   ? Passive ROM cervical spine with end range holds   ? ?  ?  ? ?  ? ? ? ? ? ? ? ? ? ? ? ? PT Short Term Goals - 03/19/22 1143   ? ?  ? PT SHORT TERM GOAL #1  ? Title independent with initial HEP   ? Status Achieved   ? ?  ?  ? ?  ? ? ? ? PT Long Term Goals - 03/25/22 1437   ? ?  ? PT LONG TERM GOAL #  1  ? Title increase cervical ROM 25% for easier backing up of the car   ? Status On-going   ?  ? PT LONG TERM GOAL #2  ? Title indepednent with posture and body mechanics   ? Status Partially Met   ? ?  ?  ? ?  ? ? ? ? ? ? ? ? Plan - 03/25/22 1435   ? ? Clinical Impression Statement Patient feels like she is getting better, less tightness and overall pain.  She is still very tight in the upper trap and the neck.   ? PT Next Visit Plan continue to work with [facility]   ? Consulted and Agree with Plan of Care Patient   ? ?  ?  ? ?  ? ? ?Patient will benefit from skilled therapeutic intervention in order to improve the following deficits and impairments:  Decreased range of motion, Increased muscle spasms, Decreased activity tolerance, Pain, Improper body mechanics, Dizziness, Decreased strength, Postural dysfunction ? ?Visit Diagnosis: ?Cervicalgia ? ?Cramp and spasm ? ?Pain in thoracic spine ? ?Dizziness and giddiness ? ? ? ? ?Problem List ?Patient Active Problem List  ? Diagnosis Date Noted  ? Onychomycosis 12/31/2021  ? Burn of left arm, first degree, initial encounter 10/10/2021  ? Wound of left leg 10/10/2021  ? Hand abrasion, infected, left, initial encounter 06/05/2021  ? Thoracic stenosis 07/27/2020  ? Dyslipidemia 07/27/2020  ? Prediabetes 12/13/2018  ? Vitamin D  deficiency 12/13/2018  ? Overweight (BMI 25.0-29.9) 12/13/2018  ? Preventative health care 09/15/2018  ? Bilateral cold feet 06/17/2018  ? Idiopathic peripheral neuropathy 06/17/2018  ? Lower extremity edema 06/17/2018  ? Right shoulder pain 08/20/2016  ? Knee pain, bilateral 04/23/2015  ? Urticaria 10/13/2014  ? Neuropathy 10/13/2014  ? Fatigue 10/13/2014  ? Abrasion of face 08/01/2013  ? Rib pain on left side 07/25/2013  ? Tinea cruris 06/22/2012  ? Eustachian tube dysfunction 04/07/2012  ? Cerumen impaction 04/07/2012  ? URI 10/30/2010  ? CHANGE IN BOWELS 04/03/2010  ? FECAL OCCULT BLOOD 04/03/2010  ? PERSONAL HX COLONIC POLYPS 04/03/2010  ? DYSPEPSIA 03/04/2010  ? GUAIAC POSITIVE STOOL 03/04/2010  ? HEMATURIA UNSPECIFIED 06/28/2009  ? DYSURIA 06/28/2009  ? HAMMER TOE 12/28/2008  ? POSTMENOPAUSAL STATUS 12/28/2008  ? BACK PAIN, LUMBAR 07/11/2008  ? BENIGN NEOPLASM OF SKIN SITE UNSPECIFIED 02/28/2008  ? Hyperlipidemia 04/15/2007  ? Essential hypertension 04/15/2007  ? DIVERTICULOSIS, COLON 04/15/2007  ? THUMB PAIN, RIGHT 04/15/2007  ? OSTEOPOROSIS 04/15/2007  ? ? Sumner Boast, PT ?03/25/2022, 2:38 PM ? ?[facility] ?[place] [address] ?[place], [place], [address] ?Phone: [phone]   Fax:  [phone] ? ?Name: Cindy Velasquez ?MRN: [id number] ?Date of Birth: [birth year] ? ? ? ?

## 2022-03-27 ENCOUNTER — Ambulatory Visit: Payer: Medicare HMO | Admitting: Physical Therapy

## 2022-03-28 ENCOUNTER — Encounter: Payer: Self-pay | Admitting: Physical Therapy

## 2022-03-28 ENCOUNTER — Ambulatory Visit: Payer: Medicare HMO | Admitting: Physical Therapy

## 2022-03-28 DIAGNOSIS — M542 Cervicalgia: Secondary | ICD-10-CM | POA: Diagnosis not present

## 2022-03-28 DIAGNOSIS — M546 Pain in thoracic spine: Secondary | ICD-10-CM

## 2022-03-28 DIAGNOSIS — R252 Cramp and spasm: Secondary | ICD-10-CM | POA: Diagnosis not present

## 2022-03-28 DIAGNOSIS — M545 Low back pain, unspecified: Secondary | ICD-10-CM | POA: Diagnosis not present

## 2022-03-28 DIAGNOSIS — R42 Dizziness and giddiness: Secondary | ICD-10-CM | POA: Diagnosis not present

## 2022-03-28 NOTE — Therapy (Signed)
[facility] ?[place] [address] ?[place], [place], [address] ?Phone: [phone]   Fax:  [phone] ? ?Physical Therapy Treatment ? ?Patient Details  ?Name: Cindy Velasquez ?MRN: [id number] ?Date of Birth: Jan 13, 1941 ?Referring Provider (PT): Narda Amber ? ? ?Encounter Date: 03/28/2022 ? ? PT End of Session - 03/28/22 1052   ? ? Visit Number 6   ? Date for PT Re-Evaluation 05/27/22   ? PT Start Time 1015   ? PT Stop Time 1100   ? PT Time Calculation (min) 45 min   ? Activity Tolerance Patient tolerated treatment well   ? Behavior During Therapy [facility] for tasks assessed/performed   ? ?  ?  ? ?  ? ? ?Past Medical History:  ?Diagnosis Date  ? Adenomatous colon polyp   ? Cancer ([facility]) 03/2015  ? BCC R tibia   ? Diverticulosis of colon   ? GERD (gastroesophageal reflux disease)   ? Hip pain   ? Hyperlipidemia   ? Hyperplastic colon polyp   ? Hypertension   ? Lactose intolerance   ? Loose stools   ? Lower back pain   ? Osteoporosis   ? Palpitations   ? ? ?Past Surgical History:  ?Procedure Laterality Date  ? EYE SURGERY  2012  ? B/L 01/2011  02/2011  ? HAMMER TOE SURGERY    ? Bilateral  ? ROTATOR CUFF REPAIR    ? Left  ? TUBAL LIGATION    ? ? ?There were no vitals filed for this visit. ? ? Subjective Assessment - 03/28/22 1013   ? ? Subjective "Good actually"   ? Currently in Pain? No/denies   ? ?  ?  ? ?  ? ? ? ? ? ? ? ? ? ? ? ? ? ? ? ? ? ? ? ? [place] Adult PT Treatment/Exercise - 03/28/22 0001   ? ?  ? Neck Exercises: Machines for Strengthening  ? UBE (Upper Arm Bike) Level 1.5 x 4 minutes   ? Power Tower Shoulder Ext 5lb 2x10   ?  ? Neck Exercises: Standing  ? Other Standing Exercises ER red, horiz abd red 2x10   ? Other Standing Exercises 4# shrugs with upper trap and levator stetches   ?  ? Moist Heat Therapy  ? Number Minutes Moist Heat 10 Minutes   ? Moist Heat Location Cervical   ?  ? Electrical Stimulation  ? Electrical Stimulation Location C/T area   ? Electrical Stimulation  Action IFC   ? Electrical Stimulation Parameters supine   ? Electrical Stimulation Goals Pain   ?  ? Manual Therapy  ? Manual Therapy Soft tissue mobilization;Passive ROM   ? Soft tissue mobilization bilateral upper traps and cervical pspinals   ? ?  ?  ? ?  ? ? ? [place] Dry Needling - 03/28/22 0001   ? ? Consent Given? Yes   ? Upper Trapezius Response Twitch reponse elicited   ? ?  ?  ? ?  ? ? ? ? ? ? ? ? ? ? PT Short Term Goals - 03/19/22 1143   ? ?  ? PT SHORT TERM GOAL #1  ? Title independent with initial HEP   ? Status Achieved   ? ?  ?  ? ?  ? ? ? ? PT Long Term Goals - 03/25/22 1437   ? ?  ? PT LONG TERM GOAL #1  ? Title increase cervical ROM 25% for  easier backing up of the car   ? Status On-going   ?  ? PT LONG TERM GOAL #2  ? Title indepednent with posture and body mechanics   ? Status Partially Met   ? ?  ?  ? ?  ? ? ? ? ? ? ? ? Plan - 03/28/22 1052   ? ? Clinical Impression Statement Pt enters clinic feeling well with less pain and tightness overall. No issue completing interventions but did require postural cues with shoulder extensions. Lead PT assisted in treatment session providing DN   ? Stability/Clinical Decision Making Evolving/Moderate complexity   ? Rehab Potential Good   ? PT Frequency 1x / week   ? PT Duration 12 weeks   ? PT Treatment/Interventions ADLs/Self Care Home Management;Electrical Stimulation;Traction;Therapeutic activities;Therapeutic exercise;Neuromuscular re-education;Patient/family education;Manual techniques;Dry needling   ? PT Next Visit Plan continue to work with strength   ? ?  ?  ? ?  ? ? ?Patient will benefit from skilled therapeutic intervention in order to improve the following deficits and impairments:  Decreased range of motion, Increased muscle spasms, Decreased activity tolerance, Pain, Improper body mechanics, Dizziness, Decreased strength, Postural dysfunction ? ?Visit Diagnosis: ?Cervicalgia ? ?Cramp and spasm ? ?Pain in thoracic spine ? ? ? ? ?Problem  List ?Patient Active Problem List  ? Diagnosis Date Noted  ? Onychomycosis 12/31/2021  ? Burn of left arm, first degree, initial encounter 10/10/2021  ? Wound of left leg 10/10/2021  ? Hand abrasion, infected, left, initial encounter 06/05/2021  ? Thoracic stenosis 07/27/2020  ? Dyslipidemia 07/27/2020  ? Prediabetes 12/13/2018  ? Vitamin D deficiency 12/13/2018  ? Overweight (BMI 25.0-29.9) 12/13/2018  ? Preventative health care 09/15/2018  ? Bilateral cold feet 06/17/2018  ? Idiopathic peripheral neuropathy 06/17/2018  ? Lower extremity edema 06/17/2018  ? Right shoulder pain 08/20/2016  ? Knee pain, bilateral 04/23/2015  ? Urticaria 10/13/2014  ? Neuropathy 10/13/2014  ? Fatigue 10/13/2014  ? Abrasion of face 08/01/2013  ? Rib pain on left side 07/25/2013  ? Tinea cruris 06/22/2012  ? Eustachian tube dysfunction 04/07/2012  ? Cerumen impaction 04/07/2012  ? URI 10/30/2010  ? CHANGE IN BOWELS 04/03/2010  ? FECAL OCCULT BLOOD 04/03/2010  ? PERSONAL HX COLONIC POLYPS 04/03/2010  ? DYSPEPSIA 03/04/2010  ? GUAIAC POSITIVE STOOL 03/04/2010  ? HEMATURIA UNSPECIFIED 06/28/2009  ? DYSURIA 06/28/2009  ? HAMMER TOE 12/28/2008  ? POSTMENOPAUSAL STATUS 12/28/2008  ? BACK PAIN, LUMBAR 07/11/2008  ? BENIGN NEOPLASM OF SKIN SITE UNSPECIFIED 02/28/2008  ? Hyperlipidemia 04/15/2007  ? Essential hypertension 04/15/2007  ? DIVERTICULOSIS, COLON 04/15/2007  ? THUMB PAIN, RIGHT 04/15/2007  ? OSTEOPOROSIS 04/15/2007  ? ? Sumner Boast, PT ?03/28/2022, 11:09 AM ? ?[facility] ?[place] [address] ?[place], [place], [address] ?Phone: [phone]   Fax:  [phone] ? ?Name: Cindy Velasquez ?MRN: [id number] ?Date of Birth: 1941-07-14 ? ? ? ?

## 2022-04-01 ENCOUNTER — Encounter: Payer: Self-pay | Admitting: Physical Therapy

## 2022-04-01 ENCOUNTER — Ambulatory Visit: Payer: Medicare HMO | Admitting: Physical Therapy

## 2022-04-01 DIAGNOSIS — R42 Dizziness and giddiness: Secondary | ICD-10-CM | POA: Diagnosis not present

## 2022-04-01 DIAGNOSIS — M545 Low back pain, unspecified: Secondary | ICD-10-CM | POA: Diagnosis not present

## 2022-04-01 DIAGNOSIS — M546 Pain in thoracic spine: Secondary | ICD-10-CM

## 2022-04-01 DIAGNOSIS — M542 Cervicalgia: Secondary | ICD-10-CM | POA: Diagnosis not present

## 2022-04-01 DIAGNOSIS — R252 Cramp and spasm: Secondary | ICD-10-CM

## 2022-04-01 NOTE — Therapy (Signed)
[facility] ?[place] [address] ?[place], [place], [address] ?Phone: [phone]   Fax:  [phone] ? ?Physical Therapy Treatment ? ?Patient Details  ?Name: Cindy Velasquez ?MRN: [id number] ?Date of Birth: 12-05-1941 ?Referring Provider (PT): Narda Amber ? ? ?Encounter Date: 04/01/2022 ? ? PT End of Session - 04/01/22 1142   ? ? Visit Number 7   ? Number of Visits 13   ? Date for PT Re-Evaluation 05/27/22   ? Authorization Type [employer]   ? PT Start Time 1054   ? PT Stop Time 1140   ? PT Time Calculation (min) 46 min   ? Activity Tolerance Patient tolerated treatment well   ? Behavior During Therapy [facility] for tasks assessed/performed   ? ?  ?  ? ?  ? ? ?Past Medical History:  ?Diagnosis Date  ? Adenomatous colon polyp   ? Cancer ([place]) 03/2015  ? BCC R tibia   ? Diverticulosis of colon   ? GERD (gastroesophageal reflux disease)   ? Hip pain   ? Hyperlipidemia   ? Hyperplastic colon polyp   ? Hypertension   ? Lactose intolerance   ? Loose stools   ? Lower back pain   ? Osteoporosis   ? Palpitations   ? ? ?Past Surgical History:  ?Procedure Laterality Date  ? EYE SURGERY  2012  ? B/L 01/2011  02/2011  ? HAMMER TOE SURGERY    ? Bilateral  ? ROTATOR CUFF REPAIR    ? Left  ? TUBAL LIGATION    ? ? ?There were no vitals filed for this visit. ? ? Subjective Assessment - 04/01/22 1057   ? ? Subjective I am doing well, less pain   ? Currently in Pain? No/denies   ? ?  ?  ? ?  ? ? ? ? ? ? ? ? ? ? ? ? ? ? ? ? ? ? ? ? [facility] Adult PT Treatment/Exercise - 04/01/22 0001   ? ?  ? Neck Exercises: Machines for Strengthening  ? Nustep L5 x 6 min   ? Cybex Row 10# 2x10   ? Power Tower Shoulder Ext 5lb 2x10   ?  ? Neck Exercises: Standing  ? Other Standing Exercises ER red, horiz abd red 2x10   ? Other Standing Exercises 4# shrugs with upper trap and levator stetches   ?  ? Manual Therapy  ? Manual Therapy Soft tissue mobilization;Passive ROM;Manual Traction   ? Soft tissue mobilization bilateral upper  traps and cervical pspinals   ? Passive ROM cervical spine with end range holds   ? Manual Traction cervical spine   ? ?  ?  ? ?  ? ? ? ? ? ? ? ? ? ? PT Education - 04/01/22 1141   ? ? Education Details education on her x-rays from 4 years ago and how that gentle traction may help, demo of anatomy and the traction device   ? Person(s) Educated Patient   ? Methods Explanation   ? Comprehension Verbalized understanding   ? ?  ?  ? ?  ? ? ? PT Short Term Goals - 03/19/22 1143   ? ?  ? PT SHORT TERM GOAL #1  ? Title independent with initial HEP   ? Status Achieved   ? ?  ?  ? ?  ? ? ? ? PT Long Term Goals - 04/01/22 1144   ? ?  ? PT LONG TERM GOAL #1  ?  Title increase cervical ROM 25% for easier backing up of the car   ? Status On-going   ?  ? PT LONG TERM GOAL #2  ? Title indepednent with posture and body mechanics   ? Status Partially Met   ?  ? PT LONG TERM GOAL #3  ? Title decrease pain 50%   ? Status On-going   ?  ? PT LONG TERM GOAL #4  ? Title report no difficulty shuffling cards   ? Status Partially Met   ? ?  ?  ? ?  ? ? ? ? ? ? ? ? Plan - 04/01/22 1142   ? ? Clinical Impression Statement Patient reports that the neck and the back are feeling better, reports tha tthe only issue is the numbness and tingling in the hands, we talked about the dx and her x-rays and how traction could help and that posture and body mechanics and strength will help prevent future issues if this is the case.  I added manual cervical distraction and occipital release.   ? PT Next Visit Plan see what she thought about the manual traction and occipital release, we could try to do the real traction   ? Consulted and Agree with Plan of Care Patient   ? ?  ?  ? ?  ? ? ?Patient will benefit from skilled therapeutic intervention in order to improve the following deficits and impairments:  Decreased range of motion, Increased muscle spasms, Decreased activity tolerance, Pain, Improper body mechanics, Dizziness, Decreased strength, Postural  dysfunction ? ?Visit Diagnosis: ?Cervicalgia ? ?Cramp and spasm ? ?Pain in thoracic spine ? ?Dizziness and giddiness ? ?Acute bilateral low back pain without sciatica ? ? ? ? ?Problem List ?Patient Active Problem List  ? Diagnosis Date Noted  ? Onychomycosis 12/31/2021  ? Burn of left arm, first degree, initial encounter 10/10/2021  ? Wound of left leg 10/10/2021  ? Hand abrasion, infected, left, initial encounter 06/05/2021  ? Thoracic stenosis 07/27/2020  ? Dyslipidemia 07/27/2020  ? Prediabetes 12/13/2018  ? Vitamin D deficiency 12/13/2018  ? Overweight (BMI 25.0-29.9) 12/13/2018  ? Preventative health care 09/15/2018  ? Bilateral cold feet 06/17/2018  ? Idiopathic peripheral neuropathy 06/17/2018  ? Lower extremity edema 06/17/2018  ? Right shoulder pain 08/20/2016  ? Knee pain, bilateral 04/23/2015  ? Urticaria 10/13/2014  ? Neuropathy 10/13/2014  ? Fatigue 10/13/2014  ? Abrasion of face 08/01/2013  ? Rib pain on left side 07/25/2013  ? Tinea cruris 06/22/2012  ? Eustachian tube dysfunction 04/07/2012  ? Cerumen impaction 04/07/2012  ? URI 10/30/2010  ? CHANGE IN BOWELS 04/03/2010  ? FECAL OCCULT BLOOD 04/03/2010  ? PERSONAL HX COLONIC POLYPS 04/03/2010  ? DYSPEPSIA 03/04/2010  ? GUAIAC POSITIVE STOOL 03/04/2010  ? HEMATURIA UNSPECIFIED 06/28/2009  ? DYSURIA 06/28/2009  ? HAMMER TOE 12/28/2008  ? POSTMENOPAUSAL STATUS 12/28/2008  ? BACK PAIN, LUMBAR 07/11/2008  ? BENIGN NEOPLASM OF SKIN SITE UNSPECIFIED 02/28/2008  ? Hyperlipidemia 04/15/2007  ? Essential hypertension 04/15/2007  ? DIVERTICULOSIS, COLON 04/15/2007  ? THUMB PAIN, RIGHT 04/15/2007  ? OSTEOPOROSIS 04/15/2007  ? ? Sumner Boast, PT ?04/01/2022, 11:45 AM ? ?[facility] ?[place] [address] ?[place], [place], [address] ?Phone: [phone]   Fax:  [phone] ? ?Name: Cindy Velasquez ?MRN: [id number] ?Date of Birth: 02-09-1941 ? ? ? ?

## 2022-04-03 ENCOUNTER — Ambulatory Visit: Payer: Medicare HMO | Admitting: Physical Therapy

## 2022-04-03 ENCOUNTER — Encounter: Payer: Self-pay | Admitting: Physical Therapy

## 2022-04-03 DIAGNOSIS — R252 Cramp and spasm: Secondary | ICD-10-CM

## 2022-04-03 DIAGNOSIS — M546 Pain in thoracic spine: Secondary | ICD-10-CM

## 2022-04-03 DIAGNOSIS — M545 Low back pain, unspecified: Secondary | ICD-10-CM | POA: Diagnosis not present

## 2022-04-03 DIAGNOSIS — R42 Dizziness and giddiness: Secondary | ICD-10-CM

## 2022-04-03 DIAGNOSIS — M542 Cervicalgia: Secondary | ICD-10-CM | POA: Diagnosis not present

## 2022-04-03 NOTE — Therapy (Signed)
[facility] ?[address] ?[place], [place], [address] ?Phone: [phone]   Fax:  [phone] ? ?Physical Therapy Treatment ? ?Patient Details  ?Name: Cindy Velasquez ?MRN: [id number] ?Date of Birth: 09/05/1941 ?Referring Provider (PT): Narda Amber ? ? ?Encounter Date: 04/03/2022 ? ? PT End of Session - 04/03/22 1147   ? ? Visit Number 8   ? Number of Visits 13   ? Date for PT Re-Evaluation 05/27/22   ? Authorization Type [employer]   ? PT Start Time 1007   ? PT Stop Time 2575   ? PT Time Calculation (min) 46 min   ? Activity Tolerance Patient tolerated treatment well   ? Behavior During Therapy [facility] for tasks assessed/performed   ? ?  ?  ? ?  ? ? ?Past Medical History:  ?Diagnosis Date  ? Adenomatous colon polyp   ? Cancer ([place]) 03/2015  ? BCC R tibia   ? Diverticulosis of colon   ? GERD (gastroesophageal reflux disease)   ? Hip pain   ? Hyperlipidemia   ? Hyperplastic colon polyp   ? Hypertension   ? Lactose intolerance   ? Loose stools   ? Lower back pain   ? Osteoporosis   ? Palpitations   ? ? ?Past Surgical History:  ?Procedure Laterality Date  ? EYE SURGERY  2012  ? B/L 01/2011  02/2011  ? HAMMER TOE SURGERY    ? Bilateral  ? ROTATOR CUFF REPAIR    ? Left  ? TUBAL LIGATION    ? ? ?There were no vitals filed for this visit. ? ? Subjective Assessment - 04/03/22 1015   ? ? Subjective Still doing well, not hurting as much   ? Currently in Pain? No/denies   ? Aggravating Factors  sitting   ? ?  ?  ? ?  ? ? ? ? ? ? ? ? ? ? ? ? ? ? ? ? ? ? ? ? [facility] Adult PT Treatment/Exercise - 04/03/22 0001   ? ?  ? Neck Exercises: Machines for Strengthening  ? Nustep L5 x 6 min   ? Cybex Row 10# 2x10   ? Lat Pull 15# 2x10   ? Power Tower Shoulder Ext 5lb 2x10   ?  ? Neck Exercises: Theraband  ? Shoulder External Rotation 20 reps;Red   ? Horizontal ADduction 15 reps;Red   ?  ? Neck Exercises: Standing  ? Other Standing Exercises 4# shrugs with upper trap and levator stetches   ?  ? Manual  Therapy  ? Manual Therapy Soft tissue mobilization;Passive ROM;Manual Traction   ? Soft tissue mobilization bilateral upper traps and cervical pspinals   ? Passive ROM cervical spine with end range holds   ? Manual Traction cervical spine   ? ?  ?  ? ?  ? ? ? Trigger Point Dry Needling - 04/03/22 0001   ? ? Consent Given? Yes   ? Upper Trapezius Response Twitch reponse elicited   ? ?  ?  ? ?  ? ? ? ? ? ? ? ? ? ? PT Short Term Goals - 03/19/22 1143   ? ?  ? PT SHORT TERM GOAL #1  ? Title independent with initial HEP   ? Status Achieved   ? ?  ?  ? ?  ? ? ? ? PT Long Term Goals - 04/03/22 1149   ? ?  ? PT LONG TERM GOAL #1  ?  Title increase cervical ROM 25% for easier backing up of the car   ? Status On-going   ?  ? PT LONG TERM GOAL #2  ? Title indepednent with posture and body mechanics   ? Status Partially Met   ?  ? PT LONG TERM GOAL #3  ? Title decrease pain 50%   ? Status Partially Met   ?  ? PT LONG TERM GOAL #4  ? Title report no difficulty shuffling cards   ? Status Partially Met   ? ?  ?  ? ?  ? ? ? ? ? ? ? ? Plan - 04/03/22 1147   ? ? Clinical Impression Statement Patient continues to report feeling better overall, we had talked about trying the traction but since she is not hurting much today and will be on a trip next week I decided to not do this.  Overall she is motivated to get better and doing her HEP, still tight in the upper traps, especially the neck but is feeling a little looser   ? PT Next Visit Plan she will be out of town next week, alter treatment depending on how she is doing   ? Consulted and Agree with Plan of Care Patient   ? ?  ?  ? ?  ? ? ?Patient will benefit from skilled therapeutic intervention in order to improve the following deficits and impairments:  Decreased range of motion, Increased muscle spasms, Decreased activity tolerance, Pain, Improper body mechanics, Dizziness, Decreased strength, Postural dysfunction ? ?Visit Diagnosis: ?Cervicalgia ? ?Cramp and spasm ? ?Pain in  thoracic spine ? ?Dizziness and giddiness ? ? ? ? ?Problem List ?Patient Active Problem List  ? Diagnosis Date Noted  ? Onychomycosis 12/31/2021  ? Burn of left arm, first degree, initial encounter 10/10/2021  ? Wound of left leg 10/10/2021  ? Hand abrasion, infected, left, initial encounter 06/05/2021  ? Thoracic stenosis 07/27/2020  ? Dyslipidemia 07/27/2020  ? Prediabetes 12/13/2018  ? Vitamin D deficiency 12/13/2018  ? Overweight (BMI 25.0-29.9) 12/13/2018  ? Preventative health care 09/15/2018  ? Bilateral cold feet 06/17/2018  ? Idiopathic peripheral neuropathy 06/17/2018  ? Lower extremity edema 06/17/2018  ? Right shoulder pain 08/20/2016  ? Knee pain, bilateral 04/23/2015  ? Urticaria 10/13/2014  ? Neuropathy 10/13/2014  ? Fatigue 10/13/2014  ? Abrasion of face 08/01/2013  ? Rib pain on left side 07/25/2013  ? Tinea cruris 06/22/2012  ? Eustachian tube dysfunction 04/07/2012  ? Cerumen impaction 04/07/2012  ? URI 10/30/2010  ? CHANGE IN BOWELS 04/03/2010  ? FECAL OCCULT BLOOD 04/03/2010  ? PERSONAL HX COLONIC POLYPS 04/03/2010  ? DYSPEPSIA 03/04/2010  ? GUAIAC POSITIVE STOOL 03/04/2010  ? HEMATURIA UNSPECIFIED 06/28/2009  ? DYSURIA 06/28/2009  ? HAMMER TOE 12/28/2008  ? POSTMENOPAUSAL STATUS 12/28/2008  ? BACK PAIN, LUMBAR 07/11/2008  ? BENIGN NEOPLASM OF SKIN SITE UNSPECIFIED 02/28/2008  ? Hyperlipidemia 04/15/2007  ? Essential hypertension 04/15/2007  ? DIVERTICULOSIS, COLON 04/15/2007  ? THUMB PAIN, RIGHT 04/15/2007  ? OSTEOPOROSIS 04/15/2007  ? ? Sumner Boast, PT ?04/03/2022, 11:50 AM ? ?[facility] ?[address] ?[place], [place], [address] ?Phone: [phone]   Fax:  [phone] ? ?Name: Cindy Velasquez ?MRN: [id number] ?Date of Birth: 09/27/1941 ? ? ? ?

## 2022-04-23 ENCOUNTER — Encounter: Payer: Self-pay | Admitting: Physical Therapy

## 2022-04-23 ENCOUNTER — Ambulatory Visit: Payer: Medicare HMO | Attending: Neurology | Admitting: Physical Therapy

## 2022-04-23 DIAGNOSIS — R42 Dizziness and giddiness: Secondary | ICD-10-CM | POA: Diagnosis not present

## 2022-04-23 DIAGNOSIS — M546 Pain in thoracic spine: Secondary | ICD-10-CM | POA: Insufficient documentation

## 2022-04-23 DIAGNOSIS — M542 Cervicalgia: Secondary | ICD-10-CM | POA: Insufficient documentation

## 2022-04-23 DIAGNOSIS — R252 Cramp and spasm: Secondary | ICD-10-CM | POA: Diagnosis not present

## 2022-04-23 NOTE — Therapy (Signed)
?[facility] ?[address] ?[place], [place], [address] ?Phone: [phone]   Fax:  [phone] ? ?Physical Therapy Treatment ? ?Patient Details  ?Name: Cindy Velasquez ?MRN: [id number] ?Date of Birth: 21-Jul-1941 ?Referring Provider (PT): Narda Amber ? ? ?Encounter Date: 04/23/2022 ? ? PT End of Session - 04/23/22 8676   ? ? Visit Number 9   ? Number of Visits 13   ? Date for PT Re-Evaluation 05/27/22   ? Authorization Type Hum[employer]   ? PT Start Time 0845   ? PT Stop Time 1950   ? PT Time Calculation (min) 47 min   ? Activity Tolerance Patient tolerated treatment well   ? Behavior During Therapy [facility] for tasks assessed/performed   ? ?  ?  ? ?  ? ? ?Past Medical History:  ?Diagnosis Date  ? Adenomatous colon polyp   ? Cancer ([place]) 03/2015  ? BCC R tibia   ? Diverticulosis of colon   ? GERD (gastroesophageal reflux disease)   ? Hip pain   ? Hyperlipidemia   ? Hyperplastic colon polyp   ? Hypertension   ? Lactose intolerance   ? Loose stools   ? Lower back pain   ? Osteoporosis   ? Palpitations   ? ? ?Past Surgical History:  ?Procedure Laterality Date  ? EYE SURGERY  2012  ? B/L 01/2011  02/2011  ? HAMMER TOE SURGERY    ? Bilateral  ? ROTATOR CUFF REPAIR    ? Left  ? TUBAL LIGATION    ? ? ?There were no vitals filed for this visit. ? ? Subjective Assessment - 04/23/22 0852   ? ? Subjective My neck is doing much better, I still have issues ith my hands   ? Currently in Pain? No/denies   ? Aggravating Factors  reports neck stiffness/tightness, biggest issue is the numbness in the finger tips, all fingers   ? ?  ?  ? ?  ? ? ? ? ? ? ? ? ? ? ? ? ? ? ? ? ? ? ? ? [facility] Adult PT Treatment/Exercise - 04/23/22 0001   ? ?  ? Therapeutic Activites   ? Therapeutic Activities ADL's   ? ADL's vacuuming, mopping, sweeping body mechanics   ?  ? Neck Exercises: Machines for Strengthening  ? Nustep L5 x 6 min   ? Cybex Row 15# 2x10   ? Lat Pull 20# 2x10   ? Power Tower Shoulder Ext 5lb 2x10   ?  ?  Neck Exercises: Standing  ? Other Standing Exercises ER red, horiz abd red 2x10, reviewed HEP with these   ?  ? Neck Exercises: Stretches  ? Upper Trapezius Stretch Right;Left;3 reps;10 seconds   ? Levator Stretch Right;Left;10 seconds   ? Corner Stretch 3 reps;20 seconds   ? Other Neck Stretches did median nerve stretches   ? ?  ?  ? ?  ? ? ? ? ? ? ? ? ? ? ? ? PT Short Term Goals - 03/19/22 1143   ? ?  ? PT SHORT TERM GOAL #1  ? Title independent with initial HEP   ? Status Achieved   ? ?  ?  ? ?  ? ? ? ? PT Long Term Goals - 04/23/22 0934   ? ?  ? PT LONG TERM GOAL #1  ? Title increase cervical ROM 25% for easier backing up of the car   ? Status Partially Met   ?  ?  PT LONG TERM GOAL #2  ? Title indepednent with posture and body mechanics   ? Status Achieved   ?  ? PT LONG TERM GOAL #3  ? Title decrease pain 50%   ? Status Partially Met   ?  ? PT LONG TERM GOAL #4  ? Title report no difficulty shuffling cards   ? Status Partially Met   ? ?  ?  ? ?  ? ? ? ? ? ? ? ? Plan - 04/23/22 0929   ? ? Clinical Impression Statement Patient repors less neck pain still tight and sore at times, her biggest issue is numbness and tingling in all the finger tips.  I looked at median nerve stretches and this seemed to be negative.  We did discuss body mechnaics for vacuuming.  I added corner stretch as well   ? PT Next Visit Plan will see next week after a two long train trips and see how she is doing   ? Consulted and Agree with Plan of Care Patient   ? ?  ?  ? ?  ? ? ?Patient will benefit from skilled therapeutic intervention in order to improve the following deficits and impairments:  Decreased range of motion, Increased muscle spasms, Decreased activity tolerance, Pain, Improper body mechanics, Dizziness, Decreased strength, Postural dysfunction ? ?Visit Diagnosis: ?Cervicalgia ? ?Cramp and spasm ? ?Pain in thoracic spine ? ?Dizziness and giddiness ? ? ? ? ?Problem List ?Patient Active Problem List  ? Diagnosis Date Noted  ?  Onychomycosis 12/31/2021  ? Burn of left arm, first degree, initial encounter 10/10/2021  ? Wound of left leg 10/10/2021  ? Hand abrasion, infected, left, initial encounter 06/05/2021  ? Thoracic stenosis 07/27/2020  ? Dyslipidemia 07/27/2020  ? Prediabetes 12/13/2018  ? Vitamin D deficiency 12/13/2018  ? Overweight (BMI 25.0-29.9) 12/13/2018  ? Preventative health care 09/15/2018  ? Bilateral cold feet 06/17/2018  ? Idiopathic peripheral neuropathy 06/17/2018  ? Lower extremity edema 06/17/2018  ? Right shoulder pain 08/20/2016  ? Knee pain, bilateral 04/23/2015  ? Urticaria 10/13/2014  ? Neuropathy 10/13/2014  ? Fatigue 10/13/2014  ? Abrasion of face 08/01/2013  ? Rib pain on left side 07/25/2013  ? Tinea cruris 06/22/2012  ? Eustachian tube dysfunction 04/07/2012  ? Cerumen impaction 04/07/2012  ? URI 10/30/2010  ? CHANGE IN BOWELS 04/03/2010  ? FECAL OCCULT BLOOD 04/03/2010  ? PERSONAL HX COLONIC POLYPS 04/03/2010  ? DYSPEPSIA 03/04/2010  ? GUAIAC POSITIVE STOOL 03/04/2010  ? HEMATURIA UNSPECIFIED 06/28/2009  ? DYSURIA 06/28/2009  ? HAMMER TOE 12/28/2008  ? POSTMENOPAUSAL STATUS 12/28/2008  ? BACK PAIN, LUMBAR 07/11/2008  ? BENIGN NEOPLASM OF SKIN SITE UNSPECIFIED 02/28/2008  ? Hyperlipidemia 04/15/2007  ? Essential hypertension 04/15/2007  ? DIVERTICULOSIS, COLON 04/15/2007  ? THUMB PAIN, RIGHT 04/15/2007  ? OSTEOPOROSIS 04/15/2007  ? ? Sumner Boast, PT ?04/23/2022, 9:35 AM ? ?[facility] ?[address] ?[address] ?[place], [place], [address] ?Phone: [phone]   Fax:  [phone] ? ?Name: Cindy Velasquez ?MRN: [id number] ?Date of Birth: Jan 09, 1941 ? ? ? ?

## 2022-04-24 ENCOUNTER — Encounter (INDEPENDENT_AMBULATORY_CARE_PROVIDER_SITE_OTHER): Payer: Medicare HMO | Admitting: Ophthalmology

## 2022-04-26 ENCOUNTER — Other Ambulatory Visit: Payer: Self-pay | Admitting: Family Medicine

## 2022-04-28 ENCOUNTER — Encounter: Payer: Self-pay | Admitting: Physical Therapy

## 2022-04-28 ENCOUNTER — Ambulatory Visit: Payer: Medicare HMO | Admitting: Physical Therapy

## 2022-04-28 DIAGNOSIS — M546 Pain in thoracic spine: Secondary | ICD-10-CM | POA: Diagnosis not present

## 2022-04-28 DIAGNOSIS — R42 Dizziness and giddiness: Secondary | ICD-10-CM | POA: Diagnosis not present

## 2022-04-28 DIAGNOSIS — R252 Cramp and spasm: Secondary | ICD-10-CM

## 2022-04-28 DIAGNOSIS — M542 Cervicalgia: Secondary | ICD-10-CM | POA: Diagnosis not present

## 2022-04-28 NOTE — Therapy (Signed)
[facility]. [place], [place], [address] Phone: [phone]   Fax:  [phone] Progress Note Reporting Period 02/24/22 to 04/28/22  See note below for Objective Data and Assessment of Progress/Goals.     Physical Therapy Treatment  Patient Details  Name: Cindy Velasquez MRN: [id number] Date of Birth: Sep 06, 1941 Referring Provider (PT): Narda Amber   Encounter Date: 04/28/2022   PT End of Session - 04/28/22 1017     Visit Number 10    Number of Visits 13    Date for PT Re-Evaluation 05/27/22    Authorization Type Humana    PT Start Time 1008    PT Stop Time 1100    PT Time Calculation (min) 52 min    Activity Tolerance Patient tolerated treatment well    Behavior During Therapy [facility] for tasks assessed/performed             Past Medical History:  Diagnosis Date   Adenomatous colon polyp    Cancer ([place]) 03/2015   BCC R tibia    Diverticulosis of colon    GERD (gastroesophageal reflux disease)    Hip pain    Hyperlipidemia    Hyperplastic colon polyp    Hypertension    Lactose intolerance    Loose stools    Lower back pain    Osteoporosis    Palpitations     Past Surgical History:  Procedure Laterality Date   EYE SURGERY  2012   B/L 01/2011  02/2011   HAMMER TOE SURGERY     Bilateral   ROTATOR CUFF REPAIR     Left   TUBAL LIGATION      There were no vitals filed for this visit.   Subjective Assessment - 04/28/22 1018     Subjective I went on a trip and did pretty good.    Currently in Pain? Yes    Pain Score 2     Pain Location Neck    Pain Orientation Right    Pain Descriptors / Indicators Spasm;Tightness    Aggravating Factors  stiffness from sitting on train so much                               [facility] Adult PT Treatment/Exercise - 04/28/22 0001       Neck Exercises: Machines for Strengthening   UBE (Upper Arm Bike) Level 1.5 x 4 minutes    Cybex Row 15#  2x10    Lat Pull 20# 2x10    Power Tower Shoulder Ext 5lb 2x10      Neck Exercises: Theraband   Shoulder External Rotation 20 reps;Red    Horizontal ADduction 20 reps;Red      Neck Exercises: Standing   Other Standing Exercises ER red, horiz abd red 2x10, reviewed HEP with these    Other Standing Exercises 4# shrugs with upper trap and levator stetches      Manual Therapy   Manual Therapy Soft tissue mobilization;Passive ROM;Manual Traction    Soft tissue mobilization bilateral upper traps and cervical pspinals    Passive ROM cervical spine with end range holds    Manual Traction cervical spine                       PT Short Term Goals - 03/19/22 1143       PT SHORT TERM GOAL #1   Title independent  with initial HEP    Status Achieved               PT Long Term Goals - 04/28/22 1045       PT LONG TERM GOAL #1   Title increase cervical ROM 25% for easier backing up of the car    Status Partially Met      PT LONG TERM GOAL #2   Title indepednent with posture and body mechanics    Status Achieved      PT LONG TERM GOAL #3   Title decrease pain 50%    Status Partially Met      PT LONG TERM GOAL #4   Title report no difficulty shuffling cards    Status Achieved                   Plan - 04/28/22 1046     Clinical Impression Statement Some tightness in the neck reports still issues with the hands.  She feels that she is doing better overall and having less pain and better motions, she went on a trip and did well. she still has some issues shuffling cards.    PT Next Visit Plan will hold fro about 2-3 weeks to assure all is well    Consulted and Agree with Plan of Care Patient             Patient will benefit from skilled therapeutic intervention in order to improve the following deficits and impairments:  Decreased range of motion, Increased muscle spasms, Decreased activity tolerance, Pain, Improper body mechanics, Dizziness, Decreased  strength, Postural dysfunction  Visit Diagnosis: Cervicalgia  Cramp and spasm  Pain in thoracic spine  Dizziness and giddiness     Problem List Patient Active Problem List   Diagnosis Date Noted   Onychomycosis 12/31/2021   Burn of left arm, first degree, initial encounter 10/10/2021   Wound of left leg 10/10/2021   Hand abrasion, infected, left, initial encounter 06/05/2021   Thoracic stenosis 07/27/2020   Dyslipidemia 07/27/2020   Prediabetes 12/13/2018   Vitamin D deficiency 12/13/2018   Overweight (BMI 25.0-29.9) 12/13/2018   Preventative health care 09/15/2018   Bilateral cold feet 06/17/2018   Idiopathic peripheral neuropathy 06/17/2018   Lower extremity edema 06/17/2018   Right shoulder pain 08/20/2016   Knee pain, bilateral 04/23/2015   Urticaria 10/13/2014   Neuropathy 10/13/2014   Fatigue 10/13/2014   Abrasion of face 08/01/2013   Rib pain on left side 07/25/2013   Tinea cruris 06/22/2012   Eustachian tube dysfunction 04/07/2012   Cerumen impaction 04/07/2012   URI 10/30/2010   CHANGE IN BOWELS 04/03/2010   FECAL OCCULT BLOOD 04/03/2010   PERSONAL HX COLONIC POLYPS 04/03/2010   DYSPEPSIA 03/04/2010   GUAIAC POSITIVE STOOL 03/04/2010   HEMATURIA UNSPECIFIED 06/28/2009   DYSURIA 06/28/2009   HAMMER TOE 12/28/2008   POSTMENOPAUSAL STATUS 12/28/2008   BACK PAIN, LUMBAR 07/11/2008   BENIGN NEOPLASM OF SKIN SITE UNSPECIFIED 02/28/2008   Hyperlipidemia 04/15/2007   Essential hypertension 04/15/2007   DIVERTICULOSIS, COLON 04/15/2007   THUMB PAIN, RIGHT 04/15/2007   OSTEOPOROSIS 04/15/2007    Sumner Boast, PT 04/28/2022, 10:48 AM  [facility]. [place], [place], [address] Phone: [phone]   Fax:  [phone]  Name: KINSLY HILD MRN: [id number] Date of Birth: [birth year]

## 2022-04-29 ENCOUNTER — Encounter: Payer: Self-pay | Admitting: Family Medicine

## 2022-04-29 ENCOUNTER — Ambulatory Visit (INDEPENDENT_AMBULATORY_CARE_PROVIDER_SITE_OTHER): Payer: Medicare HMO | Admitting: Family Medicine

## 2022-04-29 ENCOUNTER — Other Ambulatory Visit (HOSPITAL_BASED_OUTPATIENT_CLINIC_OR_DEPARTMENT_OTHER): Payer: Self-pay

## 2022-04-29 VITALS — BP 118/68 | HR 70 | Temp 97.5°F | Resp 18 | Ht 60.0 in | Wt 163.4 lb

## 2022-04-29 DIAGNOSIS — J069 Acute upper respiratory infection, unspecified: Secondary | ICD-10-CM

## 2022-04-29 DIAGNOSIS — J029 Acute pharyngitis, unspecified: Secondary | ICD-10-CM

## 2022-04-29 DIAGNOSIS — H6123 Impacted cerumen, bilateral: Secondary | ICD-10-CM

## 2022-04-29 DIAGNOSIS — R21 Rash and other nonspecific skin eruption: Secondary | ICD-10-CM

## 2022-04-29 MED ORDER — LORATADINE 10 MG PO TABS
10.0000 mg | ORAL_TABLET | Freq: Every day | ORAL | 11 refills | Status: DC
Start: 2022-04-29 — End: 2022-06-24
  Filled 2022-04-29: qty 100, 100d supply, fill #0

## 2022-04-29 MED ORDER — AMOXICILLIN 875 MG PO TABS
875.0000 mg | ORAL_TABLET | Freq: Two times a day (BID) | ORAL | 0 refills | Status: AC
  Filled 2022-04-29: qty 20, 10d supply, fill #0

## 2022-04-29 MED ORDER — CLOTRIMAZOLE-BETAMETHASONE 1-0.05 % EX CREA
1.0000 | TOPICAL_CREAM | Freq: Two times a day (BID) | CUTANEOUS | 0 refills | Status: DC
Start: 2022-04-29 — End: 2022-08-29
  Filled 2022-04-29: qty 30, 15d supply, fill #0

## 2022-04-29 MED ORDER — AZELASTINE HCL 0.1 % NA SOLN
1.0000 | Freq: Two times a day (BID) | NASAL | 12 refills | Status: DC
  Filled 2022-04-29: qty 30, 50d supply, fill #0

## 2022-04-29 NOTE — Assessment & Plan Note (Signed)
astelin and claritin

## 2022-04-29 NOTE — Progress Notes (Signed)
Established Patient Office Visit  Subjective   Patient ID: Cindy Velasquez, female    DOB: [birth year]  Age: 81 y.o. MRN: [id number]  Chief Complaint  Patient presents with   Sore Throat    Sxs x2 days, Pt states having a sore throat and full ears and runny nose. No pain with swallowing. No COVID test and no OTC meds    HPI  Patient Active Problem List   Diagnosis Date Noted   Rash 04/29/2022   Pharyngitis 04/29/2022   Viral upper respiratory tract infection 04/29/2022   Onychomycosis 12/31/2021   Burn of left arm, first degree, initial encounter 10/10/2021   Wound of left leg 10/10/2021   Hand abrasion, infected, left, initial encounter 06/05/2021   Thoracic stenosis 07/27/2020   Dyslipidemia 07/27/2020   Prediabetes 12/13/2018   Vitamin D deficiency 12/13/2018   Overweight (BMI 25.0-29.9) 12/13/2018   Preventative health care 09/15/2018   Bilateral cold feet 06/17/2018   Idiopathic peripheral neuropathy 06/17/2018   Lower extremity edema 06/17/2018   Right shoulder pain 08/20/2016   Knee pain, bilateral 04/23/2015   Urticaria 10/13/2014   Neuropathy 10/13/2014   Fatigue 10/13/2014   Abrasion of face 08/01/2013   Rib pain on left side 07/25/2013   Tinea cruris 06/22/2012   Eustachian tube dysfunction 04/07/2012   Cerumen impaction 04/07/2012   Acute upper respiratory infection 10/30/2010   CHANGE IN BOWELS 04/03/2010   FECAL OCCULT BLOOD 04/03/2010   PERSONAL HX COLONIC POLYPS 04/03/2010   DYSPEPSIA 03/04/2010   GUAIAC POSITIVE STOOL 03/04/2010   HEMATURIA UNSPECIFIED 06/28/2009   DYSURIA 06/28/2009   HAMMER TOE 12/28/2008   POSTMENOPAUSAL STATUS 12/28/2008   BACK PAIN, LUMBAR 07/11/2008   BENIGN NEOPLASM OF SKIN SITE UNSPECIFIED 02/28/2008   Hyperlipidemia 04/15/2007   Essential hypertension 04/15/2007   DIVERTICULOSIS, COLON 04/15/2007   THUMB PAIN, RIGHT 04/15/2007   OSTEOPOROSIS 04/15/2007   Past Medical History:  Diagnosis Date   Adenomatous colon  polyp    Cancer ([place]) 03/2015   BCC R tibia    Diverticulosis of colon    GERD (gastroesophageal reflux disease)    Hip pain    Hyperlipidemia    Hyperplastic colon polyp    Hypertension    Lactose intolerance    Loose stools    Lower back pain    Osteoporosis    Palpitations    Past Surgical History:  Procedure Laterality Date   EYE SURGERY  2012   B/L 01/2011  02/2011   HAMMER TOE SURGERY     Bilateral   ROTATOR CUFF REPAIR     Left   TUBAL LIGATION     Social History   Tobacco Use   Smoking status: Former    Packs/day: 0.50    Years: 30.00    Pack years: 15.00    Types: Cigarettes    Quit date: 06/02/1991    Years since quitting: 30.9   Smokeless tobacco: Never  Vaping Use   Vaping Use: Never used  Substance Use Topics   Alcohol use: Yes    Alcohol/week: 5.0 standard drinks    Types: 5 Glasses of wine per week   Drug use: No   Social History   Socioeconomic History   Marital status: Married    Spouse name: Shawnese Magner   Number of children: 4   Years of education: Not on file   Highest education level: Not on file  Occupational History   Occupation: retired--[employer]  Tobacco Use  Smoking status: Former    Packs/day: 0.50    Years: 30.00    Pack years: 15.00    Types: Cigarettes    Quit date: 06/02/1991    Years since quitting: 30.9   Smokeless tobacco: Never  Vaping Use   Vaping Use: Never used  Substance and Sexual Activity   Alcohol use: Yes    Alcohol/week: 5.0 standard drinks    Types: 5 Glasses of wine per week   Drug use: No   Sexual activity: Not Currently    Partners: Male  Other Topics Concern   Not on file  Social History Narrative   Exercise--  [employer]--yoga and cardio 3x a week      Right Handed    Lives in a one story home    Social Determinants of Health   Financial Resource Strain: Low Risk    Difficulty of Paying Living Expenses: Not hard at all  Food Insecurity: No Food Insecurity   Worried  About Charity fundraiser in the Last Year: Never true   [place] in the Last Year: Never true  Transportation Needs: No Transportation Needs   Lack of Transportation (Medical): No   Lack of Transportation (Non-Medical): No  Physical Activity: Sufficiently Active   Days of Exercise per Week: 3 days   Minutes of Exercise per Session: 60 min  Stress: No Stress Concern Present   Feeling of Stress : Not at all  Social Connections: Moderately Integrated   Frequency of Communication with Friends and Family: More than three times a week   Frequency of Social Gatherings with Friends and Family: More than three times a week   Attends Religious Services: Never   Marine scientist or Organizations: Yes   Attends Music therapist: More than 4 times per year   Marital Status: Married  Human resources officer Violence: Not At Risk   Fear of Current or Ex-Partner: No   Emotionally Abused: No   Physically Abused: No   Sexually Abused: No   Family Status  Relation Name Status   Mother  Deceased at age 13   Father  Deceased at age 66   Brother  Alive   Brother  Alive   Neg Hx  (Not Specified)   Family History  Problem Relation Age of Onset   Hypertension Mother    Cancer Mother 9       ?   Hyperlipidemia Mother    Obesity Mother    Hypertension Father    Stroke Father    Heart disease Father    Hypertension Brother    Testicular cancer Brother 18   Hypertension Brother    Heart disease Brother        quadruple bypass   Colon cancer Neg Hx    Stomach cancer Neg Hx    Throat cancer Neg Hx    No Known Allergies    Review of Systems  Constitutional:  Positive for chills and malaise/fatigue. Negative for diaphoresis and fever.  HENT:  Positive for congestion, ear pain and sore throat.   Respiratory:  Negative for cough, sputum production, shortness of breath and wheezing.      Objective:     BP 118/68 (BP Location: Left Arm, Patient Position: Sitting, Cuff  Size: Normal)   Pulse 70   Temp (!) 97.5 F (36.4 C) (Oral)   Resp 18   Ht 5' (1.524 m)   Wt 163 lb 6.4 oz (74.1 kg)  SpO2 96%   BMI 31.91 kg/m  BP Readings from Last 3 Encounters:  04/29/22 118/68  01/28/22 128/84  12/30/21 122/68   Wt Readings from Last 3 Encounters:  04/29/22 163 lb 6.4 oz (74.1 kg)  01/28/22 162 lb 9.6 oz (73.8 kg)  12/30/21 161 lb 3.2 oz (73.1 kg)   SpO2 Readings from Last 3 Encounters:  04/29/22 96%  01/28/22 96%  12/30/21 97%      Physical Exam Vitals and nursing note reviewed.  Constitutional:      Appearance: She is well-developed.  HENT:     Head: Normocephalic and atraumatic.     Right Ear: Tympanic membrane normal. There is impacted cerumen.     Left Ear: A middle ear effusion is present. There is impacted cerumen.     Ears:     Comments: Both ears are impacted -- irrigated with no complications  Tm normal and intact  Pt requested them to be cleaned out Unable to use hoop     Nose: Congestion and rhinorrhea present. No nasal tenderness. Rhinorrhea is clear.     Right Sinus: No maxillary sinus tenderness or frontal sinus tenderness.     Left Sinus: No maxillary sinus tenderness or frontal sinus tenderness.     Mouth/Throat:     Pharynx: Posterior oropharyngeal erythema present.  Eyes:     Conjunctiva/sclera: Conjunctivae normal.  Neck:     Thyroid: No thyromegaly.     Vascular: No carotid bruit or JVD.  Cardiovascular:     Rate and Rhythm: Normal rate and regular rhythm.     Heart sounds: Normal heart sounds. No murmur heard. Pulmonary:     Effort: Pulmonary effort is normal. No respiratory distress.     Breath sounds: Normal breath sounds. No wheezing or rales.  Chest:     Chest wall: No tenderness.  Musculoskeletal:     Cervical back: Normal range of motion and neck supple.  Lymphadenopathy:     Cervical: Cervical adenopathy present.  Skin:    Findings: Rash present. Rash is papular.       Neurological:     Mental  Status: She is alert and oriented to person, place, and time.  Psychiatric:        Mood and Affect: Mood normal.        Behavior: Behavior normal.     No results found for any visits on 04/29/22.  Last CBC Lab Results  Component Value Date   WBC 7.7 07/08/2021   HGB 13.7 07/08/2021   HCT 41.8 07/08/2021   MCV 91.3 07/08/2021   MCH 29.9 07/08/2021   RDW 14.3 07/08/2021   PLT 261 28/31/5176   Last metabolic panel Lab Results  Component Value Date   GLUCOSE 89 02/10/2022   NA 141 02/10/2022   K 4.5 02/10/2022   CL 101 02/10/2022   CO2 31 02/10/2022   BUN 15 02/10/2022   CREATININE 0.64 02/10/2022   GFRNONAA >60 07/08/2021   CALCIUM 9.6 02/10/2022   PROT 7.2 02/10/2022   ALBUMIN 4.1 02/10/2022   LABGLOB 3.1 12/09/2018   AGRATIO 1.4 12/09/2018   BILITOT 0.6 02/10/2022   ALKPHOS 87 02/10/2022   AST 21 02/10/2022   ALT 21 02/10/2022   ANIONGAP 6 07/08/2021   Last lipids Lab Results  Component Value Date   CHOL 142 06/04/2021   HDL 40.20 06/04/2021   LDLCALC 68 06/04/2021   LDLDIRECT 129.1 08/23/2007   TRIG 170.0 (H) 06/04/2021   CHOLHDL 4 06/04/2021  Last thyroid functions Lab Results  Component Value Date   TSH 1.29 09/15/2019   T4TOTAL 9.7 09/03/2018   Last vitamin D Lab Results  Component Value Date   VD25OH 50.2 03/30/2019   Last vitamin B12 and Folate Lab Results  Component Value Date   TWSFKCLE75 170 10/13/2014   FOLATE >20.0 10/13/2014      The ASCVD Risk score (Arnett DK, et al., 2019) failed to calculate for the following reasons:   The 2019 ASCVD risk score is only valid for ages 36 to 39    Assessment & Plan:   Problem List Items Addressed This Visit       Unprioritized   Viral upper respiratory tract infection   Relevant Medications   clotrimazole-betamethasone (LOTRISONE) cream   azelastine (ASTELIN) 0.1 % nasal spray   loratadine (CLARITIN) 10 MG tablet   Rash - Primary    lotrisone bid  rto prn        Relevant  Medications   clotrimazole-betamethasone (LOTRISONE) cream   Pharyngitis    abx per orders Tylenol prn        Relevant Medications   amoxicillin (AMOXIL) 875 MG tablet   Acute upper respiratory infection    astelin and claritin       Relevant Medications   clotrimazole-betamethasone (LOTRISONE) cream    Return if symptoms worsen or fail to improve.    Ann Held, DO

## 2022-04-29 NOTE — Assessment & Plan Note (Signed)
abx per orders Tylenol prn

## 2022-04-29 NOTE — Patient Instructions (Signed)

## 2022-04-29 NOTE — Assessment & Plan Note (Signed)
Both ears irrigated  Tm I b/l  L ear + fluid

## 2022-04-29 NOTE — Assessment & Plan Note (Signed)
lotrisone bid  rto prn

## 2022-04-30 ENCOUNTER — Other Ambulatory Visit (HOSPITAL_BASED_OUTPATIENT_CLINIC_OR_DEPARTMENT_OTHER): Payer: Self-pay

## 2022-05-08 ENCOUNTER — Other Ambulatory Visit: Payer: Self-pay | Admitting: Family Medicine

## 2022-05-08 ENCOUNTER — Encounter (INDEPENDENT_AMBULATORY_CARE_PROVIDER_SITE_OTHER): Payer: Medicare HMO | Admitting: Ophthalmology

## 2022-05-19 ENCOUNTER — Ambulatory Visit: Payer: Medicare HMO | Attending: Neurology | Admitting: Physical Therapy

## 2022-05-19 ENCOUNTER — Encounter: Payer: Self-pay | Admitting: Physical Therapy

## 2022-05-19 DIAGNOSIS — R252 Cramp and spasm: Secondary | ICD-10-CM | POA: Insufficient documentation

## 2022-05-19 DIAGNOSIS — M546 Pain in thoracic spine: Secondary | ICD-10-CM | POA: Diagnosis not present

## 2022-05-19 DIAGNOSIS — M542 Cervicalgia: Secondary | ICD-10-CM | POA: Insufficient documentation

## 2022-05-19 NOTE — Therapy (Signed)
[address], [place], [address] Phone: [phone]   Fax:  [phone]  Physical Therapy Treatment  Patient Details  Name: Cindy Velasquez MRN: [id number] Date of Birth: [birth year] Referring Provider (PT): Narda Amber   Encounter Date: 05/19/2022   PT End of Session - 05/19/22 1310     Visit Number 11    Date for PT Re-Evaluation 05/27/22    Authorization Type [employer]    PT Start Time 4970    PT Stop Time 1356    PT Time Calculation (min) 48 min    Activity Tolerance Patient tolerated treatment well    Behavior During Therapy [facility] for tasks assessed/performed             Past Medical History:  Diagnosis Date   Adenomatous colon polyp    Cancer ([place]) 03/2015   BCC R tibia    Diverticulosis of colon    GERD (gastroesophageal reflux disease)    Hip pain    Hyperlipidemia    Hyperplastic colon polyp    Hypertension    Lactose intolerance    Loose stools    Lower back pain    Osteoporosis    Palpitations     Past Surgical History:  Procedure Laterality Date   EYE SURGERY  2012   B/L 01/2011  02/2011   HAMMER TOE SURGERY     Bilateral   ROTATOR CUFF REPAIR     Left   TUBAL LIGATION      There were no vitals filed for this visit.   Subjective Assessment - 05/19/22 1311     Subjective Reports that she has a little more tension in the higher cervical area    Currently in Pain? Yes    Pain Score 4     Pain Location Neck    Pain Descriptors / Indicators Sore;Tightness;Spasm    Aggravating Factors  sore and tight                               [facility] Adult PT Treatment/Exercise - 05/19/22 0001       Modalities   Modalities Traction      Traction   Type of Traction Cervical    Min (lbs) 11    Hold Time static    Time 12      Manual Therapy   Manual Therapy Soft tissue mobilization;Passive ROM;Manual Traction    Soft tissue mobilization bilateral upper traps  and cervical pspinals    Passive ROM cervical spine with end range holds                       PT Short Term Goals - 03/19/22 1143       PT SHORT TERM GOAL #1   Title independent with initial HEP    Status Achieved               PT Long Term Goals - 05/19/22 1336       PT LONG TERM GOAL #1   Title increase cervical ROM 25% for easier backing up of the car    Status Partially Met      PT LONG TERM GOAL #2   Title indepednent with posture and body mechanics    Status Achieved      PT LONG TERM GOAL #3   Title decrease pain 50%    Status Partially  Met      PT LONG TERM GOAL #4   Title report no difficulty shuffling cards    Status Achieved                   Plan - 05/19/22 1336     Clinical Impression Statement Patient reports that she is doing better overall but has a little more pain in the upper cervical area, she is very tight here with a lot of spasms and into the upper traps.  I decided to try the traction today due to her numbness and the MD was thinking she may need an MRI due to the numbness, this would give her and the MD an idea of how the traction did with pain and numbness    PT Next Visit Plan I tried traction today to see if this helps, I do feel that she will need to return to the Md to see if this helps and if she needs an MRI and what that shows    Consulted and Agree with Plan of Care Patient             Patient will benefit from skilled therapeutic intervention in order to improve the following deficits and impairments:  Decreased range of motion, Increased muscle spasms, Decreased activity tolerance, Pain, Improper body mechanics, Dizziness, Decreased strength, Postural dysfunction  Visit Diagnosis: Cervicalgia  Cramp and spasm  Pain in thoracic spine     Problem List Patient Active Problem List   Diagnosis Date Noted   Rash 04/29/2022   Pharyngitis 04/29/2022   Viral upper respiratory tract infection  04/29/2022   Onychomycosis 12/31/2021   Burn of left arm, first degree, initial encounter 10/10/2021   Wound of left leg 10/10/2021   Hand abrasion, infected, left, initial encounter 06/05/2021   Thoracic stenosis 07/27/2020   Dyslipidemia 07/27/2020   Prediabetes 12/13/2018   Vitamin D deficiency 12/13/2018   Overweight (BMI 25.0-29.9) 12/13/2018   Preventative health care 09/15/2018   Bilateral cold feet 06/17/2018   Idiopathic peripheral neuropathy 06/17/2018   Lower extremity edema 06/17/2018   Right shoulder pain 08/20/2016   Knee pain, bilateral 04/23/2015   Urticaria 10/13/2014   Neuropathy 10/13/2014   Fatigue 10/13/2014   Abrasion of face 08/01/2013   Rib pain on left side 07/25/2013   Tinea cruris 06/22/2012   Eustachian tube dysfunction 04/07/2012   Cerumen impaction 04/07/2012   Acute upper respiratory infection 10/30/2010   CHANGE IN BOWELS 04/03/2010   FECAL OCCULT BLOOD 04/03/2010   PERSONAL HX COLONIC POLYPS 04/03/2010   DYSPEPSIA 03/04/2010   GUAIAC POSITIVE STOOL 03/04/2010   HEMATURIA UNSPECIFIED 06/28/2009   DYSURIA 06/28/2009   HAMMER TOE 12/28/2008   POSTMENOPAUSAL STATUS 12/28/2008   BACK PAIN, LUMBAR 07/11/2008   BENIGN NEOPLASM OF SKIN SITE UNSPECIFIED 02/28/2008   Hyperlipidemia 04/15/2007   Essential hypertension 04/15/2007   DIVERTICULOSIS, COLON 04/15/2007   THUMB PAIN, RIGHT 04/15/2007   OSTEOPOROSIS 04/15/2007    Sumner Boast, PT 05/19/2022, 1:41 PM  [place] [place], [place], [address] Phone: [phone]   Fax:  [phone]  Name: MARYLENE MASEK MRN: [id number] Date of Birth: Jan 26, 1941

## 2022-05-23 ENCOUNTER — Ambulatory Visit (INDEPENDENT_AMBULATORY_CARE_PROVIDER_SITE_OTHER): Payer: Medicare HMO | Admitting: Family Medicine

## 2022-05-23 ENCOUNTER — Encounter: Payer: Self-pay | Admitting: Family Medicine

## 2022-05-23 VITALS — BP 132/60 | HR 80 | Temp 97.7°F | Resp 18 | Ht 60.0 in | Wt 162.8 lb

## 2022-05-23 DIAGNOSIS — E559 Vitamin D deficiency, unspecified: Secondary | ICD-10-CM

## 2022-05-23 DIAGNOSIS — M542 Cervicalgia: Secondary | ICD-10-CM

## 2022-05-23 DIAGNOSIS — Z0000 Encounter for general adult medical examination without abnormal findings: Secondary | ICD-10-CM | POA: Diagnosis not present

## 2022-05-23 DIAGNOSIS — H1013 Acute atopic conjunctivitis, bilateral: Secondary | ICD-10-CM | POA: Diagnosis not present

## 2022-05-23 DIAGNOSIS — I10 Essential (primary) hypertension: Secondary | ICD-10-CM | POA: Diagnosis not present

## 2022-05-23 MED ORDER — OLOPATADINE HCL 0.2 % OP SOLN: OPHTHALMIC | 0 refills | Status: AC

## 2022-05-23 NOTE — Progress Notes (Signed)
Subjective:     Cindy Velasquez is a 81 y.o. female and is here for a comprehensive physical exam. The patient reports  no new problems   She saw neuro for numbness in her hands and went to pt with no relief.  Neuro wanted mri c spine but she is out on maternity leave until Dec.   .  Social History   Socioeconomic History   Marital status: Married    Spouse name: [name]   Number of children: 4   Years of education: Not on file   Highest education level: Not on file  Occupational History   Occupation: retired--[employer]  Tobacco Use   Smoking status: Former    Packs/day: 0.50    Years: 30.00    Total pack years: 15.00    Types: Cigarettes    Quit date: 06/02/1991    Years since quitting: 30.9   Smokeless tobacco: Never  Vaping Use   Vaping Use: Never used  Substance and Sexual Activity   Alcohol use: Yes    Alcohol/week: 5.0 standard drinks of alcohol    Types: 5 Glasses of wine per week   Drug use: No   Sexual activity: Not Currently    Partners: Male  Other Topics Concern   Not on file  Social History Narrative   Exercise--  [employer]--yoga and cardio 3x a week      Right Handed    Lives in a one story home    Social Determinants of Health   Financial Resource Strain: Low Risk  (01/28/2022)   Overall Financial Resource Strain (CARDIA)    Difficulty of Paying Living Expenses: Not hard at all  Food Insecurity: No Food Insecurity (01/28/2022)   Hunger Vital Sign    Worried About Running Out of Food in the Last Year: Never true    [place] in the Last Year: Never true  Transportation Needs: No Transportation Needs (01/28/2022)   PRAPARE - Hydrologist (Medical): No    Lack of Transportation (Non-Medical): No  Physical Activity: Sufficiently Active (01/28/2022)   Exercise Vital Sign    Days of Exercise per Week: 3 days    Minutes of Exercise per Session: 60 min  Stress: No Stress Concern Present (01/28/2022)    [place]    Feeling of Stress : Not at all  Social Connections: Moderately Integrated (01/28/2022)   Social Connection and Isolation Panel [NHANES]    Frequency of Communication with Friends and Family: More than three times a week    Frequency of Social Gatherings with Friends and Family: More than three times a week    Attends Religious Services: Never    Marine scientist or Organizations: Yes    Attends Music therapist: More than 4 times per year    Marital Status: Married  Human resources officer Violence: Not At Risk (01/28/2022)   Humiliation, Afraid, Rape, and Kick questionnaire    Fear of Current or Ex-Partner: No    Emotionally Abused: No    Physically Abused: No    Sexually Abused: No   Health Maintenance  Topic Date Due   Zoster Vaccines- Shingrix (1 of 2) Never done   COVID-19 Vaccine (5 - Pfizer series) 01/24/2022   DEXA SCAN  05/24/2023 (Originally 06/17/2013)   INFLUENZA VACCINE  07/08/2022   MAMMOGRAM  09/10/2022   TETANUS/TDAP  06/05/2031   Pneumonia Vaccine 65+  Years old  Completed   HPV VACCINES  Aged Out    The following portions of the patient's history were reviewed and updated as appropriate: She  has a past medical history of Adenomatous colon polyp, Cancer ([place]) (03/2015), Diverticulosis of colon, GERD (gastroesophageal reflux disease), Hip pain, Hyperlipidemia, Hyperplastic colon polyp, Hypertension, Lactose intolerance, Loose stools, Lower back pain, Osteoporosis, and Palpitations. She does not have any pertinent problems on file. She  has a past surgical history that includes Tubal ligation; Hammer toe surgery; Rotator cuff repair; and Eye surgery (2012). Her family history includes Cancer (age of onset: 66) in her mother; Heart disease in her brother and father; Hyperlipidemia in her mother; Hypertension in her brother, brother, father, and mother; Obesity in her mother; Stroke in  her father; Testicular cancer (age of onset: 37) in her brother. She  reports that she quit smoking about 30 years ago. Her smoking use included cigarettes. She has a 15.00 pack-year smoking history. She has never used smokeless tobacco. She reports current alcohol use of about 5.0 standard drinks of alcohol per week. She reports that she does not use drugs. She has a current medication list which includes the following prescription(s): azelastine, vitamin d3, clotrimazole-betamethasone, clotrimazole-betamethasone, fluticasone, hydrochlorothiazide, lisinopril, loratadine, melatonin, icaps areds 2, olopatadine hcl, rosuvastatin, silver sulfadiazine, and terbinafine. Current Outpatient Medications on File Prior to Visit  Medication Sig Dispense Refill   azelastine (ASTELIN) 0.1 % nasal spray Place 1 spray into both nostrils 2 (two) times daily. Use in each nostril as directed 30 mL 12   Cholecalciferol (VITAMIN D3) 125 MCG (5000 UT) CAPS Take 1 capsule (5,000 Units total) by mouth daily. 30 capsule 0   clotrimazole-betamethasone (LOTRISONE) cream Apply 1 application topically 2 (two) times daily. Use as needed for rash 30 g 1   clotrimazole-betamethasone (LOTRISONE) cream Apply to affected area(s) 2 times daily. 30 g 0   fluticasone (FLONASE) 50 MCG/ACT nasal spray Place 2 sprays into both nostrils daily. 16 g 6   hydrochlorothiazide (HYDRODIURIL) 25 MG tablet TAKE 1/2 TABLET(12.5 MG) BY MOUTH EVERY MORNING 45 tablet 1   lisinopril (ZESTRIL) 20 MG tablet TAKE 1 TABLET BY MOUTH DAILY 90 tablet 1   loratadine (CLARITIN) 10 MG tablet Take 1 tablet (10 mg total) by mouth daily. 100 tablet 11   MELATONIN ER PO Take by mouth.     Multiple Vitamins-Minerals (ICAPS AREDS 2) CAPS Take 2 capsules by mouth daily.      rosuvastatin (CRESTOR) 20 MG tablet TAKE 1 TABLET BY MOUTH EVERY DAY 90 tablet 1   silver sulfADIAZINE (SILVADENE) 1 % cream Apply 1 application on to the skin daily. 50 g 0   terbinafine (LAMISIL)  250 MG tablet Take 1 tablet (250 mg total) by mouth daily. 120 tablet 1   No current facility-administered medications on file prior to visit.   She has No Known Allergies..  Review of Systems Review of Systems  Constitutional: Negative for activity change, appetite change and fatigue.  HENT: Negative for hearing loss, congestion, tinnitus and ear discharge.  dentist q57mEyes: Negative for visual disturbance (see optho q1y -- vision corrected to 20/20 with glasses).  Respiratory: Negative for cough, chest tightness and shortness of breath.   Cardiovascular: Negative for chest pain, palpitations and leg swelling.  Gastrointestinal: Negative for abdominal pain, diarrhea, constipation and abdominal distention.  Genitourinary: Negative for urgency, frequency, decreased urine volume and difficulty urinating.  Musculoskeletal: Negative for back pain, arthralgias and gait problem.  Skin: Negative  for color change, pallor and rash.  Neurological: Negative for dizziness, light-headedness, numbness and headaches.  Hematological: Negative for adenopathy. Does not bruise/bleed easily.  Psychiatric/Behavioral: Negative for suicidal ideas, confusion, sleep disturbance, self-injury, dysphoric mood, decreased concentration and agitation.      Objective:    BP 132/60 (BP Location: Right Arm, Patient Position: Sitting, Cuff Size: Large)   Pulse 80   Temp 97.7 F (36.5 C) (Oral)   Resp 18   Ht 5' (1.524 m)   Wt 162 lb 12.8 oz (73.8 kg)   SpO2 95%   BMI 31.79 kg/m  General appearance: alert, cooperative, appears stated age, and no distress Head: Normocephalic, without obvious abnormality, atraumatic Eyes: negative findings: lids and lashes normal, conjunctivae and sclerae normal, and pupils equal, round, reactive to light and accomodation Ears: normal TM's and external ear canals both ears Nose: Nares normal. Septum midline. Mucosa normal. No drainage or sinus tenderness. Throat: lips, mucosa,  and tongue normal; teeth and gums normal Neck: no adenopathy, no carotid bruit, no JVD, supple, symmetrical, trachea midline, and thyroid not enlarged, symmetric, no tenderness/mass/nodules Lungs: clear to auscultation bilaterally Heart: regular rate and rhythm, S1, S2 normal, no murmur, click, rub or gallop Abdomen: soft, non-tender; bowel sounds normal; no masses,  no organomegaly Extremities: extremities normal, atraumatic, no cyanosis or edema Skin: Skin color, texture, turgor normal. No rashes or lesions Lymph nodes: Cervical, supraclavicular, and axillary nodes normal. Neurologic: Alert and oriented X 3, normal strength and tone. Normal symmetric reflexes. Normal coordination and gait    Assessment:    Healthy female exam.      Plan:     Ghm utd  Check labs  See After Visit Summary for Counseling Recommendations   1. Vitamin D deficiency Check labs  - VITAMIN D 25 Hydroxy (Vit-D Deficiency, Fractures); Future  2. Primary hypertension Well controlled, no changes to meds. Encouraged heart healthy diet such as the DASH diet and exercise as tolerated.   - CBC with Differential/Platelet; Future - Comprehensive metabolic panel; Future - Lipid panel; Future  3. Allergic conjunctivitis of both eyes stable - Olopatadine HCl (PATADAY) 0.2 % SOLN; 1 gtt each eye qd; Refill: 0  4. Cervicalgia Pt did pt with no relief----- mri c spine ordered  - MR Cervical Spine Wo Contrast; Future  5. Preventative health care Ghm utd Check labs

## 2022-05-23 NOTE — Patient Instructions (Signed)
Preventive Care 65 Years and Older, Female Preventive care refers to lifestyle choices and visits with your health care provider that can promote health and wellness. Preventive care visits are also called wellness exams. What can I expect for my preventive care visit? Counseling Your health care provider may ask you questions about your: Medical history, including: Past medical problems. Family medical history. Pregnancy and menstrual history. History of falls. Current health, including: Memory and ability to understand (cognition). Emotional well-being. Home life and relationship well-being. Sexual activity and sexual health. Lifestyle, including: Alcohol, nicotine or tobacco, and drug use. Access to firearms. Diet, exercise, and sleep habits. Work and work environment. Sunscreen use. Safety issues such as seatbelt and bike helmet use. Physical exam Your health care provider will check your: Height and weight. These may be used to calculate your BMI (body mass index). BMI is a measurement that tells if you are at a healthy weight. Waist circumference. This measures the distance around your waistline. This measurement also tells if you are at a healthy weight and may help predict your risk of certain diseases, such as type 2 diabetes and high blood pressure. Heart rate and blood pressure. Body temperature. Skin for abnormal spots. What immunizations do I need?  Vaccines are usually given at various ages, according to a schedule. Your health care provider will recommend vaccines for you based on your age, medical history, and lifestyle or other factors, such as travel or where you work. What tests do I need? Screening Your health care provider may recommend screening tests for certain conditions. This may include: Lipid and cholesterol levels. Hepatitis C test. Hepatitis B test. HIV (human immunodeficiency virus) test. STI (sexually transmitted infection) testing, if you are at  risk. Lung cancer screening. Colorectal cancer screening. Diabetes screening. This is done by checking your blood sugar (glucose) after you have not eaten for a while (fasting). Mammogram. Talk with your health care provider about how often you should have regular mammograms. BRCA-related cancer screening. This may be done if you have a family history of breast, ovarian, tubal, or peritoneal cancers. Bone density scan. This is done to screen for osteoporosis. Talk with your health care provider about your test results, treatment options, and if necessary, the need for more tests. Follow these instructions at home: Eating and drinking  Eat a diet that includes fresh fruits and vegetables, whole grains, lean protein, and low-fat dairy products. Limit your intake of foods with high amounts of sugar, saturated fats, and salt. Take vitamin and mineral supplements as recommended by your health care provider. Do not drink alcohol if your health care provider tells you not to drink. If you drink alcohol: Limit how much you have to 0-1 drink a day. Know how much alcohol is in your drink. In the [place] one drink equals one 12 oz bottle of beer (355 mL), one 5 oz glass of wine (148 mL), or one 1 oz glass of hard liquor (44 mL). Lifestyle Brush your teeth every morning and night with fluoride toothpaste. Floss one time each day. Exercise for at least 30 minutes 5 or more days each week. Do not use any products that contain nicotine or tobacco. These products include cigarettes, chewing tobacco, and vaping devices, such as e-cigarettes. If you need help quitting, ask your health care provider. Do not use drugs. If you are sexually active, practice safe sex. Use a condom or other form of protection in order to prevent STIs. Take aspirin only as told by   your health care provider. Make sure that you understand how much to take and what form to take. Work with your health care provider to find out whether it  is safe and beneficial for you to take aspirin daily. Ask your health care provider if you need to take a cholesterol-lowering medicine (statin). Find healthy ways to manage stress, such as: Meditation, yoga, or listening to music. Journaling. Talking to a trusted person. Spending time with friends and family. Minimize exposure to UV radiation to reduce your risk of skin cancer. Safety Always wear your seat belt while driving or riding in a vehicle. Do not drive: If you have been drinking alcohol. Do not ride with someone who has been drinking. When you are tired or distracted. While texting. If you have been using any mind-altering substances or drugs. Wear a helmet and other protective equipment during sports activities. If you have firearms in your house, make sure you follow all gun safety procedures. What's next? Visit your health care provider once a year for an annual wellness visit. Ask your health care provider how often you should have your eyes and teeth checked. Stay up to date on all vaccines. This information is not intended to replace advice given to you by your health care provider. Make sure you discuss any questions you have with your health care provider. Document Revised: 05/22/2021 Document Reviewed: 05/22/2021 Elsevier Patient Education  2023 Elsevier Inc.  

## 2022-05-23 NOTE — Assessment & Plan Note (Signed)
ghm utd Check labs  See avs  

## 2022-05-27 ENCOUNTER — Other Ambulatory Visit (INDEPENDENT_AMBULATORY_CARE_PROVIDER_SITE_OTHER): Payer: Medicare HMO

## 2022-05-27 DIAGNOSIS — E559 Vitamin D deficiency, unspecified: Secondary | ICD-10-CM | POA: Diagnosis not present

## 2022-05-27 DIAGNOSIS — I10 Essential (primary) hypertension: Secondary | ICD-10-CM | POA: Diagnosis not present

## 2022-05-27 LAB — COMPREHENSIVE METABOLIC PANEL
ALT: 24 U/L (ref 0–35)
AST: 23 U/L (ref 0–37)
Albumin: 3.8 g/dL (ref 3.5–5.2)
Alkaline Phosphatase: 90 U/L (ref 39–117)
BUN: 15 mg/dL (ref 6–23)
CO2: 33 mEq/L — ABNORMAL HIGH (ref 19–32)
Calcium: 9.5 mg/dL (ref 8.4–10.5)
Chloride: 101 mEq/L (ref 96–112)
Creatinine, Ser: 0.7 mg/dL (ref 0.40–1.20)
GFR: 81.44 mL/min (ref >60.00)
Glucose, Bld: 96 mg/dL (ref 70–99)
Potassium: 4.6 mEq/L (ref 3.5–5.1)
Sodium: 142 mEq/L (ref 135–145)
Total Bilirubin: 0.6 mg/dL (ref 0.2–1.2)
Total Protein: 7.1 g/dL (ref 6.0–8.3)

## 2022-05-27 LAB — CBC WITH DIFFERENTIAL/PLATELET
Basophils Absolute: 0.1 10*3/uL (ref 0.0–0.1)
Basophils Relative: 1 % (ref 0.0–3.0)
Eosinophils Absolute: 0.4 10*3/uL (ref 0.0–0.7)
Eosinophils Relative: 6.6 % — ABNORMAL HIGH (ref 0.0–5.0)
HCT: 44.3 % (ref 36.0–46.0)
Hemoglobin: 14.5 g/dL (ref 12.0–15.0)
Lymphocytes Relative: 32.8 % (ref 12.0–46.0)
Lymphs Abs: 1.8 10*3/uL (ref 0.7–4.0)
MCHC: 32.7 g/dL (ref 30.0–36.0)
MCV: 89.5 fl (ref 78.0–100.0)
Monocytes Absolute: 0.6 10*3/uL (ref 0.1–1.0)
Monocytes Relative: 11.5 % (ref 3.0–12.0)
Neutro Abs: 2.7 10*3/uL (ref 1.4–7.7)
Neutrophils Relative %: 48.1 % (ref 43.0–77.0)
Platelets: 251 10*3/uL (ref 150.0–400.0)
RBC: 4.95 Mil/uL (ref 3.87–5.11)
RDW: 15 % (ref 11.5–15.5)
WBC: 5.6 10*3/uL (ref 4.0–10.5)

## 2022-05-27 LAB — LIPID PANEL
Cholesterol: 156 mg/dL (ref 0–200)
HDL: 45.1 mg/dL (ref >39.00)
LDL Cholesterol: 85 mg/dL (ref 0–99)
NonHDL: 110.52
Total CHOL/HDL Ratio: 3
Triglycerides: 127 mg/dL (ref 0.0–149.0)
VLDL: 25.4 mg/dL (ref 0.0–40.0)

## 2022-05-27 LAB — VITAMIN D 25 HYDROXY (VIT D DEFICIENCY, FRACTURES): VITD: 56.67 ng/mL (ref 30.00–100.00)

## 2022-06-06 ENCOUNTER — Encounter: Payer: Medicare HMO | Admitting: Family Medicine

## 2022-06-16 ENCOUNTER — Inpatient Hospital Stay: Admission: RE | Admit: 2022-06-16 | Payer: Medicare HMO | Source: Ambulatory Visit

## 2022-06-17 ENCOUNTER — Ambulatory Visit
Admission: RE | Admit: 2022-06-17 | Discharge: 2022-06-17 | Disposition: A | Discharge status: Home / Self Care | Payer: Medicare HMO | Source: Ambulatory Visit | Attending: Family Medicine | Admitting: Family Medicine

## 2022-06-17 DIAGNOSIS — M4802 Spinal stenosis, cervical region: Secondary | ICD-10-CM | POA: Diagnosis not present

## 2022-06-17 DIAGNOSIS — M542 Cervicalgia: Secondary | ICD-10-CM | POA: Diagnosis not present

## 2022-06-24 ENCOUNTER — Ambulatory Visit (INDEPENDENT_AMBULATORY_CARE_PROVIDER_SITE_OTHER): Payer: Medicare HMO | Admitting: Family Medicine

## 2022-06-24 ENCOUNTER — Encounter: Payer: Self-pay | Admitting: Family Medicine

## 2022-06-24 VITALS — BP 137/64 | HR 75 | Temp 98.1°F | Resp 16 | Ht 60.0 in | Wt 161.0 lb

## 2022-06-24 DIAGNOSIS — M4712 Other spondylosis with myelopathy, cervical region: Secondary | ICD-10-CM

## 2022-06-24 NOTE — Progress Notes (Shared)
Subjective:   By signing my name below, I, Cindy Velasquez, attest that this documentation has been prepared under the direction and in the presence of Cindy Held DO 06/24/2022     Patient ID: Cindy Velasquez, female    DOB: [birth year], 81 y.o.   MRN: [id number]  No chief complaint on file.   HPI Patient is in today for an office visit.  She is requesting for information about her MRI results. There was found to be degenerative changes and cord compression. She is to be referred to a neurosurgeon for further evaluation. She originally done the testing for the numbness in her hands. She denies of undergoing any fusion in her spine. She reports that her neck symptoms are improving but the numbness in her hands is still persistent.   Past Medical History:  Diagnosis Date   Adenomatous colon polyp    Cancer ([place]) 03/2015   BCC R tibia    Diverticulosis of colon    GERD (gastroesophageal reflux disease)    Hip pain    Hyperlipidemia    Hyperplastic colon polyp    Hypertension    Lactose intolerance    Loose stools    Lower back pain    Osteoporosis    Palpitations     Past Surgical History:  Procedure Laterality Date   EYE SURGERY  2012   B/L 01/2011  02/2011   HAMMER TOE SURGERY     Bilateral   ROTATOR CUFF REPAIR     Left   TUBAL LIGATION      Family History  Problem Relation Age of Onset   Hypertension Mother    Cancer Mother 46       ?   Hyperlipidemia Mother    Obesity Mother    Hypertension Father    Stroke Father    Heart disease Father    Hypertension Brother    Testicular cancer Brother 74   Hypertension Brother    Heart disease Brother        quadruple bypass   Colon cancer Neg Hx    Stomach cancer Neg Hx    Throat cancer Neg Hx     Social History   Socioeconomic History   Marital status: Married    Spouse name: Cindy Velasquez   Number of children: 4   Years of education: Not on file   Highest education level: Not on file  Occupational  History   Occupation: retired--[employer]  Tobacco Use   Smoking status: Former    Packs/day: 0.50    Years: 30.00    Total pack years: 15.00    Types: Cigarettes    Quit date: 06/02/1991    Years since quitting: 31.0   Smokeless tobacco: Never  Vaping Use   Vaping Use: Never used  Substance and Sexual Activity   Alcohol use: Yes    Alcohol/week: 5.0 standard drinks of alcohol    Types: 5 Glasses of wine per week   Drug use: No   Sexual activity: Not Currently    Partners: Male  Other Topics Concern   Not on file  Social History Narrative   Exercise--  [employer]--yoga and cardio 3x a week      Right Handed    Lives in a one story home    Social Determinants of Health   Financial Resource Strain: Low Risk  (01/28/2022)   Overall Financial Resource Strain (CARDIA)    Difficulty of Paying Living Expenses: Not hard  at all  Food Insecurity: No Food Insecurity (01/28/2022)   Hunger Vital Sign    Worried About Running Out of Food in the Last Year: Never true    Ran Out of Food in the Last Year: Never true  Transportation Needs: No Transportation Needs (01/28/2022)   PRAPARE - Hydrologist (Medical): No    Lack of Transportation (Non-Medical): No  Physical Activity: Sufficiently Active (01/28/2022)   Exercise Vital Sign    Days of Exercise per Week: 3 days    Minutes of Exercise per Session: 60 min  Stress: No Stress Concern Present (01/28/2022)   [place]    Feeling of Stress : Not at all  Social Connections: Moderately Integrated (01/28/2022)   Social Connection and Isolation Panel [NHANES]    Frequency of Communication with Friends and Family: More than three times a week    Frequency of Social Gatherings with Friends and Family: More than three times a week    Attends Religious Services: Never    Marine scientist or Organizations: Yes    Attends Programme researcher, broadcasting/film/video: More than 4 times per year    Marital Status: Married  Human resources officer Violence: Not At Risk (01/28/2022)   Humiliation, Afraid, Rape, and Kick questionnaire    Fear of Current or Ex-Partner: No    Emotionally Abused: No    Physically Abused: No    Sexually Abused: No    Outpatient Medications Prior to Visit  Medication Sig Dispense Refill   azelastine (ASTELIN) 0.1 % nasal spray Place 1 spray into both nostrils 2 (two) times daily. Use in each nostril as directed 30 mL 12   Cholecalciferol (VITAMIN D3) 125 MCG (5000 UT) CAPS Take 1 capsule (5,000 Units total) by mouth daily. 30 capsule 0   clotrimazole-betamethasone (LOTRISONE) cream Apply 1 application topically 2 (two) times daily. Use as needed for rash 30 g 1   clotrimazole-betamethasone (LOTRISONE) cream Apply to affected area(s) 2 times daily. 30 g 0   fluticasone (FLONASE) 50 MCG/ACT nasal spray Place 2 sprays into both nostrils daily. 16 g 6   hydrochlorothiazide (HYDRODIURIL) 25 MG tablet TAKE 1/2 TABLET(12.5 MG) BY MOUTH EVERY MORNING 45 tablet 1   lisinopril (ZESTRIL) 20 MG tablet TAKE 1 TABLET BY MOUTH DAILY 90 tablet 1   loratadine (CLARITIN) 10 MG tablet Take 1 tablet (10 mg total) by mouth daily. 100 tablet 11   MELATONIN ER PO Take by mouth.     Multiple Vitamins-Minerals (ICAPS AREDS 2) CAPS Take 2 capsules by mouth daily.      Olopatadine HCl (PATADAY) 0.2 % SOLN 1 gtt each eye qd  0   rosuvastatin (CRESTOR) 20 MG tablet TAKE 1 TABLET BY MOUTH EVERY DAY 90 tablet 1   silver sulfADIAZINE (SILVADENE) 1 % cream Apply 1 application on to the skin daily. 50 g 0   terbinafine (LAMISIL) 250 MG tablet Take 1 tablet (250 mg total) by mouth daily. 120 tablet 1   No facility-administered medications prior to visit.    No Known Allergies  ROS     Objective:    Physical Exam Constitutional:      General: She is not in acute distress.    Appearance: Normal appearance. She is not ill-appearing.   HENT:     Head: Normocephalic and atraumatic.     Right Ear: External ear normal.     Left Ear: External ear  normal.  Eyes:     Extraocular Movements: Extraocular movements intact.     Pupils: Pupils are equal, round, and reactive to light.  Cardiovascular:     Rate and Rhythm: Normal rate and regular rhythm.     Heart sounds: Normal heart sounds. No murmur heard.    No gallop.  Pulmonary:     Effort: No respiratory distress.     Breath sounds: Normal breath sounds. No wheezing or rales.  Skin:    General: Skin is warm and dry.  Neurological:     Mental Status: She is alert and oriented to person, place, and time.  Psychiatric:        Judgment: Judgment normal.     There were no vitals taken for this visit. Wt Readings from Last 3 Encounters:  05/23/22 162 lb 12.8 oz (73.8 kg)  04/29/22 163 lb 6.4 oz (74.1 kg)  01/28/22 162 lb 9.6 oz (73.8 kg)    Diabetic Foot Exam - Simple   No data filed    Lab Results  Component Value Date   WBC 5.6 05/27/2022   HGB 14.5 05/27/2022   HCT 44.3 05/27/2022   PLT 251.0 05/27/2022   GLUCOSE 96 05/27/2022   CHOL 156 05/27/2022   TRIG 127.0 05/27/2022   HDL 45.10 05/27/2022   LDLDIRECT 129.1 08/23/2007   LDLCALC 85 05/27/2022   ALT 24 05/27/2022   AST 23 05/27/2022   NA 142 05/27/2022   K 4.6 05/27/2022   CL 101 05/27/2022   CREATININE 0.70 05/27/2022   BUN 15 05/27/2022   CO2 33 (H) 05/27/2022   TSH 1.29 09/15/2019   INR 1.0 07/29/2020   HGBA1C 5.9 (H) 08/02/2020    Lab Results  Component Value Date   TSH 1.29 09/15/2019   Lab Results  Component Value Date   WBC 5.6 05/27/2022   HGB 14.5 05/27/2022   HCT 44.3 05/27/2022   MCV 89.5 05/27/2022   PLT 251.0 05/27/2022   Lab Results  Component Value Date   NA 142 05/27/2022   K 4.6 05/27/2022   CO2 33 (H) 05/27/2022   GLUCOSE 96 05/27/2022   BUN 15 05/27/2022   CREATININE 0.70 05/27/2022   BILITOT 0.6 05/27/2022   ALKPHOS 90 05/27/2022   AST 23 05/27/2022    ALT 24 05/27/2022   PROT 7.1 05/27/2022   ALBUMIN 3.8 05/27/2022   CALCIUM 9.5 05/27/2022   ANIONGAP 6 07/08/2021   GFR 81.44 05/27/2022   Lab Results  Component Value Date   CHOL 156 05/27/2022   Lab Results  Component Value Date   HDL 45.10 05/27/2022   Lab Results  Component Value Date   LDLCALC 85 05/27/2022   Lab Results  Component Value Date   TRIG 127.0 05/27/2022   Lab Results  Component Value Date   CHOLHDL 3 05/27/2022   Lab Results  Component Value Date   HGBA1C 5.9 (H) 08/02/2020       Assessment & Plan:   Problem List Items Addressed This Visit   None    No orders of the defined types were placed in this encounter.   I, Cindy Velasquez, personally preformed the services described in this documentation.  All medical record entries made by the scribe were at my direction and in my presence.  I have reviewed the chart and discharge instructions (if applicable) and agree that the record reflects my personal performance and is accurate and complete. 06/24/2022   I,Amber Collins,acting as a Education administrator for  Cindy Held, DO.,have documented all relevant documentation on the behalf of Cindy Held, DO,as directed by  Cindy Held, DO while in the presence of Cindy Held, DO.    [employer]

## 2022-06-24 NOTE — Patient Instructions (Signed)
Spinal Stenosis  Spinal stenosis is a condition that happens when the spinal canal narrows. The spinal canal is the space between the bones of your spine (vertebrae). This narrowing puts pressure on the spinal cord or nerves. Spinal stenosis can affect the vertebrae in the neck, upper back, and lower back. This condition can range from mild to severe. In some cases, there are no symptoms. What are the causes? This condition is caused by areas of bone pushing into the spinal canal. This condition may be present at birth (congenital), or it may be caused by: Slow breakdown of your vertebrae (spinal degeneration). This usually starts around 81 years of age. Injury (trauma) to your spine. Tumors in your spine. Calcium deposits in your spine. What increases the risk? The following factors may make you more likely to develop this condition: Being older than age 35. Having a problem present at birth with an abnormally shaped spine (congenitalspinal deformity), such as scoliosis. Having arthritis. What are the signs or symptoms? Symptoms of this condition include: Pain in the neck or back that is generally worse with activities, particularly when you stand or walk. Numbness, tingling, hot or cold sensations, weakness, or tiredness (fatigue) in your leg or legs. Pain going from the buttock, down the thigh, and to the calf (sciatica). This can happen in one or both legs. Frequent episodes of falling. A foot-slapping gait that leads to muscle weakness. In more severe cases, you may develop: Problems having a bowel movement or urinating. Difficulty having sex. Loss of feeling in your legs and inability to walk. Symptoms may come on slowly and get worse over time. In some cases, there are no symptoms. How is this diagnosed? This condition is diagnosed based on your medical history and a physical exam. You will also have tests, such as an MRI, a CT scan, or an X-ray. How is this treated? Treatment  for this condition often focuses on managing your pain and any other symptoms. Treatment may include: Practicing good posture to lessen pressure on your nerves. Exercising to strengthen muscles, build endurance, improve balance, and maintain range of motion. This may include physical therapy to restore movement and strength to your back. Losing weight, if needed. Medicines to reduce inflammation or pain. This may include a medicine that is injected into your spine (steroidinjection). Assistive devices, such as a corset or brace. In some cases, surgery may be needed. The most common procedure is decompression laminectomy. This is done to remove excess bone that puts pressure on your nerve roots. Follow these instructions at home: Managing pain, stiffness, and swelling  Practice good posture. If you were given a brace or a corset, wear it as told by your health care provider. Maintain a healthy weight. Talk with your health care provider if you need help losing weight. If directed, apply heat to the affected area as often as told by your health care provider. Use the heat source that your health care provider recommends, such as a moist heat pack or a heating pad. Place a towel between your skin and the heat source. Leave the heat on for 20-30 minutes. Remove the heat if your skin turns bright red. This is especially important if you are unable to feel pain, heat, or cold. You may have a greater risk of getting burned. Activity Do all exercises and stretches as told by your health care provider. Do not do any activities that cause pain. Ask your health care provider what activities are safe for you.  Do not lift anything that is heavier than 10 lb (4.5 kg), or the limit that you are told by your health care provider. Return to your normal activities as told by your health care provider. Ask your health care provider what activities are safe for you. General instructions Take over-the-counter and  prescription medicines only as told by your health care provider. Do not use any products that contain nicotine or tobacco, such as cigarettes, e-cigarettes, and chewing tobacco. If you need help quitting, ask your health care provider. Eat a healthy diet. This includes plenty of fruits and vegetables, whole grains, and low-fat (lean) protein. Keep all follow-up visits as told by your health care provider. This is important. Contact a health care provider if: Your symptoms do not get better or they get worse. You have a fever. Get help right away if: You have new pain or symptoms of severe pain, such as: New or worsening pain in your neck or upper back. Severe pain that cannot be controlled with medicines. A severe headache that gets worse when you stand. You are dizzy. You have vision problems, such as blurred vision or double vision. You have nausea or you vomit. You develop new or worsening numbness or tingling in your back or legs. You have pain, redness, swelling, or warmth in your arm or leg. Summary Spinal stenosis is a condition that happens when the spinal canal narrows. The spinal canal is the space between the bones of your spine (vertebrae). This narrowing puts pressure on the spinal cord or nerves. This condition may be caused by a birth defect, breakdown of your vertebrae, trauma, tumors, or calcium deposits. Spinal stenosis can cause numbness, weakness, or pain in the buttocks, neck, back, and legs. This condition is usually diagnosed with your medical history, a physical exam, and tests, such as an MRI, a CT scan, or an X-ray. This information is not intended to replace advice given to you by your health care provider. Make sure you discuss any questions you have with your health care provider. Document Revised: 12/25/2021 Document Reviewed: 09/22/2019 Elsevier Patient Education  [place].

## 2022-06-25 DIAGNOSIS — M4712 Other spondylosis with myelopathy, cervical region: Secondary | ICD-10-CM | POA: Insufficient documentation

## 2022-06-25 NOTE — Assessment & Plan Note (Signed)
Discussed mri with pt---- referral to neurosurgery placed  rto prn

## 2022-07-02 DIAGNOSIS — M4712 Other spondylosis with myelopathy, cervical region: Secondary | ICD-10-CM | POA: Diagnosis not present

## 2022-07-02 DIAGNOSIS — Z6831 Body mass index (BMI) 31.0-31.9, adult: Secondary | ICD-10-CM | POA: Diagnosis not present

## 2022-07-05 IMAGING — CT CT ANGIO NECK
1 of 11 series · 6 of 33 positions shown · IV contrast (Omnipaque)
Comparison: None.

CLINICAL DATA: Lightheaded and dizzy since yesterday.

EXAM:
CT ANGIOGRAPHY HEAD AND NECK
TECHNIQUE: Multidetector CT imaging of the head and neck was performed using
the standard protocol during bolus administration of intravenous
contrast. Multiplanar CT image reconstructions and MIPs were
obtained to evaluate the vascular anatomy. Carotid stenosis
measurements (when applicable) are obtained utilizing NASCET
criteria, using the distal internal carotid diameter as the
denominator.
CONTRAST:  100mL OMNIPAQUE IOHEXOL 350 MG/ML SOLN

[Series 11: axial thin · axial · 0.42mm/px · z∈[-299,-66]mm · 6 of 328 slices shown]
[im 47/328  soft-tissue]
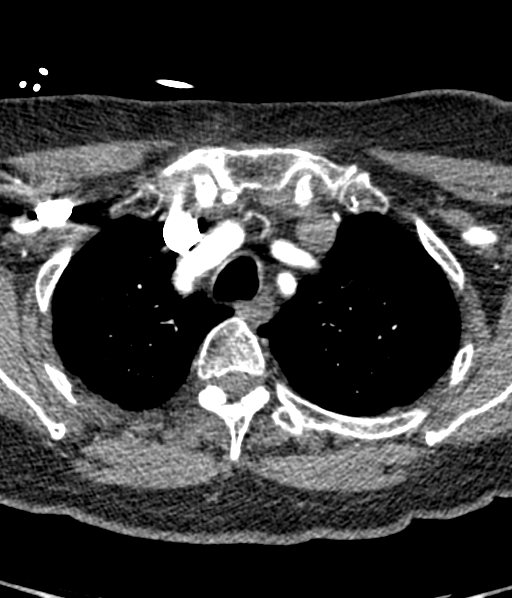
[im 94/328  bone]
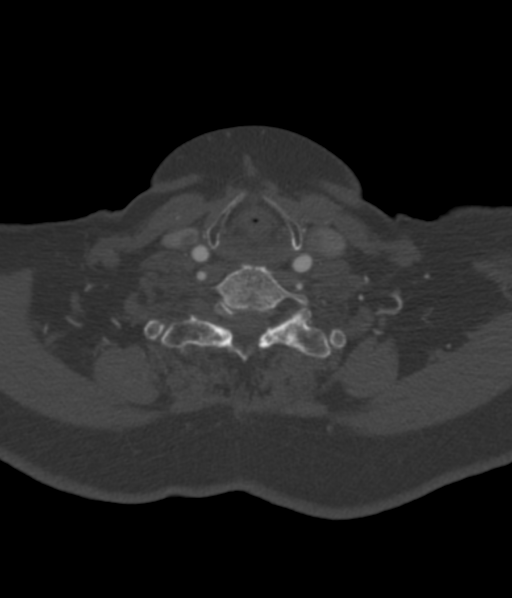
[im 141/328  soft-tissue]
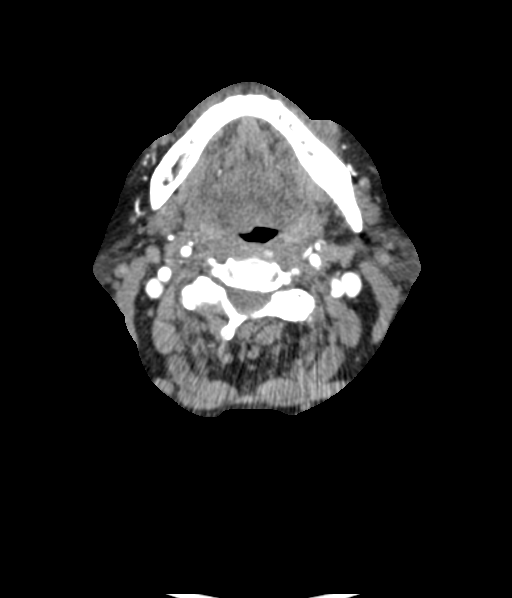
[im 187/328  bone]
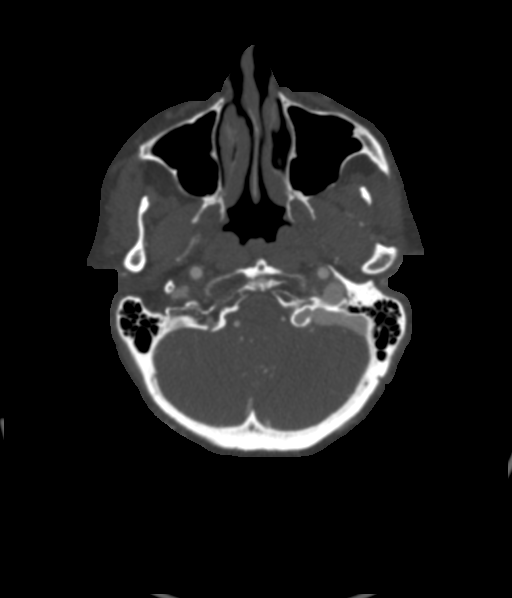
[im 234/328  soft-tissue]
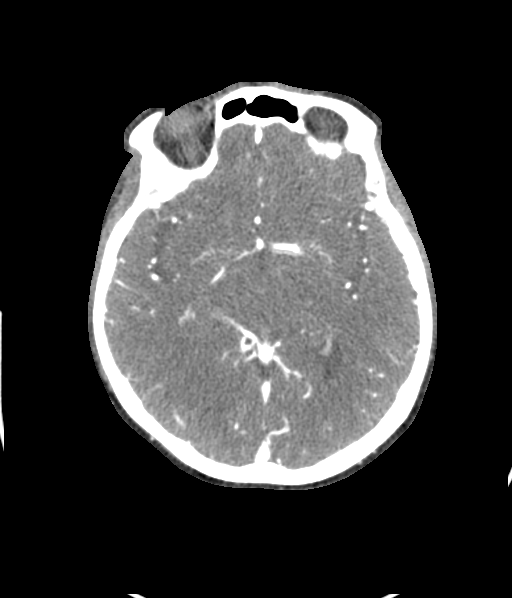
[im 281/328  bone]
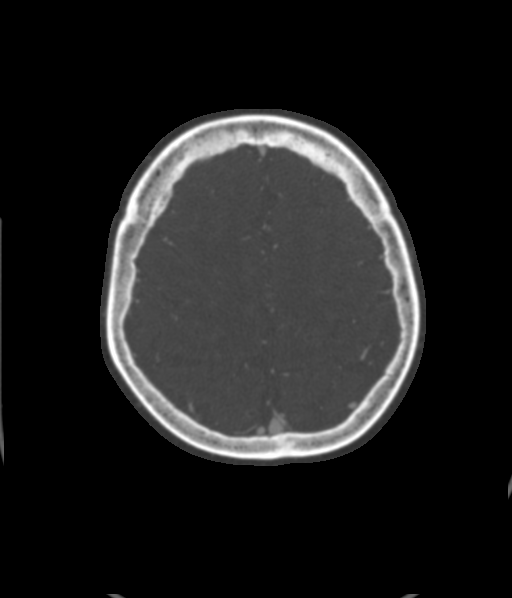

[6 of 33 positions shown; findings below may reference images not displayed]

FINDINGS: CT HEAD FINDINGS

Brain: Mild age related volume loss. Mild chronic appearing small
vessel ischemic change of the cerebral hemispheric white matter. No
sign of acute infarction, mass lesion, hemorrhage, hydrocephalus or
extra-axial collection.

Vascular: Minimal atherosclerotic calcification of the vessels at
the base of the brain.

Skull: Negative

Sinuses: Clear

Orbits: Normal

Review of the MIP images confirms the above findings

CTA NECK FINDINGS

Aortic arch: Aortic atherosclerotic calcification. Branching pattern
is normal without origin stenosis.

Right carotid system: Common carotid artery widely patent to the
bifurcation. Calcified plaque at the carotid bifurcation but no
stenosis. Cervical ICA widely patent.

Left carotid system: Common carotid artery widely patent to the
bifurcation. Carotid bifurcation is normal without soft or calcified
plaque. No stenosis. Cervical ICA widely patent.

Vertebral arteries: No flow limiting subclavian disease. Both
vertebral artery origins are widely patent. The right vertebral
artery is dominant. Both vertebral arteries are patent through the
cervical region to the foramen magnum.

Skeleton: Ordinary cervical spondylosis. Some degree of spinal
stenosis at C5-6. Chronic fusion at C4-5.

Other neck: No mass or lymphadenopathy.

Upper chest: Normal

Review of the MIP images confirms the above findings

CTA HEAD FINDINGS

Anterior circulation: Both internal carotid arteries are patent
through the skull base and siphon regions. No siphon stenosis. The
anterior and middle cerebral vessels are patent without proximal
stenosis, aneurysm or vascular malformation. No large or medium
vessel occlusion. Fetal origin of the right PCA.

Posterior circulation: Both vertebral arteries are widely patent
through the foramen magnum to the basilar. No basilar stenosis.
Posterior circulation branch vessels are normal.

Venous sinuses: Patent and normal.

Anatomic variants: None significant.

Review of the MIP images confirms the above findings
IMPRESSION: No acute head CT finding. Chronic small-vessel ischemic changes of
the cerebral hemispheric white matter.

No acute large or medium vessel occlusion.

Aortic atherosclerosis.

Minimal carotid bifurcation atherosclerosis on the right. No
stenosis on either side.

No vertebral artery or other posterior circulation pathology.

## 2022-07-16 ENCOUNTER — Encounter (INDEPENDENT_AMBULATORY_CARE_PROVIDER_SITE_OTHER): Payer: Self-pay

## 2022-07-21 DIAGNOSIS — H0102B Squamous blepharitis left eye, upper and lower eyelids: Secondary | ICD-10-CM | POA: Diagnosis not present

## 2022-07-21 DIAGNOSIS — H353132 Nonexudative age-related macular degeneration, bilateral, intermediate dry stage: Secondary | ICD-10-CM | POA: Diagnosis not present

## 2022-07-21 DIAGNOSIS — H43812 Vitreous degeneration, left eye: Secondary | ICD-10-CM | POA: Diagnosis not present

## 2022-07-21 DIAGNOSIS — Z961 Presence of intraocular lens: Secondary | ICD-10-CM | POA: Diagnosis not present

## 2022-07-21 DIAGNOSIS — H0102A Squamous blepharitis right eye, upper and lower eyelids: Secondary | ICD-10-CM | POA: Diagnosis not present

## 2022-07-21 DIAGNOSIS — H10413 Chronic giant papillary conjunctivitis, bilateral: Secondary | ICD-10-CM | POA: Diagnosis not present

## 2022-07-21 DIAGNOSIS — H04223 Epiphora due to insufficient drainage, bilateral lacrimal glands: Secondary | ICD-10-CM | POA: Diagnosis not present

## 2022-07-28 ENCOUNTER — Encounter (INDEPENDENT_AMBULATORY_CARE_PROVIDER_SITE_OTHER): Payer: Medicare HMO | Admitting: Ophthalmology

## 2022-07-28 DIAGNOSIS — H353132 Nonexudative age-related macular degeneration, bilateral, intermediate dry stage: Secondary | ICD-10-CM

## 2022-07-28 DIAGNOSIS — H43813 Vitreous degeneration, bilateral: Secondary | ICD-10-CM

## 2022-07-28 DIAGNOSIS — H35033 Hypertensive retinopathy, bilateral: Secondary | ICD-10-CM | POA: Diagnosis not present

## 2022-07-28 DIAGNOSIS — I10 Essential (primary) hypertension: Secondary | ICD-10-CM

## 2022-08-04 DIAGNOSIS — M4712 Other spondylosis with myelopathy, cervical region: Secondary | ICD-10-CM | POA: Diagnosis not present

## 2022-08-08 ENCOUNTER — Ambulatory Visit (INDEPENDENT_AMBULATORY_CARE_PROVIDER_SITE_OTHER): Payer: Medicare HMO | Admitting: Family Medicine

## 2022-08-08 ENCOUNTER — Encounter: Payer: Self-pay | Admitting: Family Medicine

## 2022-08-08 VITALS — BP 122/70 | HR 86 | Temp 98.1°F | Resp 18 | Ht 60.0 in | Wt 163.8 lb

## 2022-08-08 DIAGNOSIS — E785 Hyperlipidemia, unspecified: Secondary | ICD-10-CM | POA: Diagnosis not present

## 2022-08-08 DIAGNOSIS — Z01818 Encounter for other preprocedural examination: Secondary | ICD-10-CM | POA: Diagnosis not present

## 2022-08-08 DIAGNOSIS — I10 Essential (primary) hypertension: Secondary | ICD-10-CM

## 2022-08-08 NOTE — Progress Notes (Signed)
Subjective:                           Cindy Velasquez is a 81 y.o. female who presents to the office today for a preoperative consultation at the request of surgeon Dr Marcello Moores who plans on performing Cervical fusion  on  TBA   - . This consultation is requested for the specific conditions prompting preoperative evaluation (i.e. because of potential affect on operative risk): age, bp and cholesterol. . Planned anesthesia: general. The patient has the following known anesthesia issues:  none . Patients bleeding risk: no recent abnormal bleeding. Patient does not have objections to receiving blood products if needed.  The following portions of the patient's history were reviewed and updated as appropriate: She  has a past medical history of Adenomatous colon polyp, Cancer ([place]) (03/2015), Diverticulosis of colon, GERD (gastroesophageal reflux disease), Hip pain, Hyperlipidemia, Hyperplastic colon polyp, Hypertension, Lactose intolerance, Loose stools, Lower back pain, Osteoporosis, and Palpitations. She does not have any pertinent problems on file. She  has a past surgical history that includes Tubal ligation; Hammer toe surgery; Rotator cuff repair; and Eye surgery (2012). Her family history includes Cancer (age of onset: 57) in her mother; Heart disease in her brother and father; Hyperlipidemia in her mother; Hypertension in her brother, brother, father, and mother; Obesity in her mother; Stroke in her father; Testicular cancer (age of onset: 26) in her brother. She  reports that she quit smoking about 31 years ago. Her smoking use included cigarettes. She has a 15.00 pack-year smoking history. She has never used smokeless tobacco. She reports current alcohol use of about 5.0 standard drinks of alcohol per week. She reports that she does not use drugs. She has a current medication list which includes the following prescription(s): azelastine, vitamin d3, clotrimazole-betamethasone,  clotrimazole-betamethasone, fluticasone, hydrochlorothiazide, lisinopril, melatonin, icaps areds 2, olopatadine hcl, rosuvastatin, silver sulfadiazine, and terbinafine. Current Outpatient Medications on File Prior to Visit  Medication Sig Dispense Refill   azelastine (ASTELIN) 0.1 % nasal spray Place 1 spray into both nostrils 2 (two) times daily. Use in each nostril as directed 30 mL 12   Cholecalciferol (VITAMIN D3) 125 MCG (5000 UT) CAPS Take 1 capsule (5,000 Units total) by mouth daily. 30 capsule 0   clotrimazole-betamethasone (LOTRISONE) cream Apply 1 application topically 2 (two) times daily. Use as needed for rash 30 g 1   clotrimazole-betamethasone (LOTRISONE) cream Apply to affected area(s) 2 times daily. 30 g 0   fluticasone (FLONASE) 50 MCG/ACT nasal spray Place 2 sprays into both nostrils daily. 16 g 6   hydrochlorothiazide (HYDRODIURIL) 25 MG tablet TAKE 1/2 TABLET(12.5 MG) BY MOUTH EVERY MORNING 45 tablet 1   lisinopril (ZESTRIL) 20 MG tablet TAKE 1 TABLET BY MOUTH DAILY 90 tablet 1   MELATONIN ER PO Take by mouth.     Multiple Vitamins-Minerals (ICAPS AREDS 2) CAPS Take 2 capsules by mouth daily.      Olopatadine HCl (PATADAY) 0.2 % SOLN 1 gtt each eye qd  0   rosuvastatin (CRESTOR) 20 MG tablet TAKE 1 TABLET BY MOUTH EVERY DAY 90 tablet 1   silver sulfADIAZINE (SILVADENE) 1 % cream Apply 1 application on to the skin daily. 50 g 0   terbinafine (LAMISIL) 250 MG tablet Take 1 tablet (250 mg total) by mouth daily. 120 tablet 1   No current facility-administered medications on file prior to visit.   She has No Known Allergies.Marland Kitchen  Review of Systems Review of Systems  Constitutional: Negative for activity change, appetite change and fatigue.  HENT: Negative for hearing loss, congestion, tinnitus and ear discharge.  dentist q51mEyes: Negative for visual disturbance (see optho q1y -- vision corrected to 20/20 with glasses).  Respiratory: Negative for cough, chest tightness and  shortness of breath.   Cardiovascular: Negative for chest pain, palpitations and leg swelling.  Gastrointestinal: Negative for abdominal pain, diarrhea, constipation and abdominal distention.  Genitourinary: Negative for urgency, frequency, decreased urine volume and difficulty urinating.  Musculoskeletal: Negative for back pain, arthralgias and gait problem.  Skin: Negative for color change, pallor and rash.  Neurological: Negative for dizziness, light-headedness, numbness and headaches.  Hematological: Negative for adenopathy. Does not bruise/bleed easily.  Psychiatric/Behavioral: Negative for suicidal ideas, confusion, sleep disturbance, self-injury, dysphoric mood, decreased concentration and agitation.       Objective:    BP 122/70 (BP Location: Left Arm, Patient Position: Sitting, Cuff Size: Normal)   Pulse 86   Temp 98.1 F (36.7 C) (Oral)   Resp 18   Ht 5' (1.524 m)   Wt 163 lb 12.8 oz (74.3 kg)   SpO2 95%   BMI 31.99 kg/m  General appearance: alert, cooperative, appears stated age, and no distress Head: Normocephalic, without obvious abnormality, atraumatic Eyes: negative findings: lids and lashes normal, conjunctivae and sclerae normal, and pupils equal, round, reactive to light and accomodation Ears: normal TM's and external ear canals both ears Nose: Nares normal. Septum midline. Mucosa normal. No drainage or sinus tenderness. Throat: lips, mucosa, and tongue normal; teeth and gums normal Neck: no adenopathy, no carotid bruit, no JVD, supple, symmetrical, trachea midline, and thyroid not enlarged, symmetric, no tenderness/mass/nodules Back: symmetric, no curvature. ROM normal. No CVA tenderness. Lungs: clear to auscultation bilaterally Heart: regular rate and rhythm, S1, S2 normal, no murmur, click, rub or gallop Abdomen: soft, non-tender; bowel sounds normal; no masses,  no organomegaly Extremities: extremities normal, atraumatic, no cyanosis or edema Pulses: 2+ and  symmetric Skin: Skin color, texture, turgor normal. No rashes or lesions Lymph nodes: Cervical, supraclavicular, and axillary nodes normal. Neurologic: Alert and oriented X 3, normal strength and tone. Normal symmetric reflexes. Normal coordination and gait  Predictors of intubation difficulty:  Morbid obesity? yes -   Anatomically abnormal facies? no    Cardiographics ECG: normal sinus rhythm, no blocks or conduction defects, no ischemic changes Echocardiogram: not done  Imaging Chest x-ray: none  Lab Review  Lab on 05/27/2022  Component Date Value   WBC 05/27/2022 5.6    RBC 05/27/2022 4.95    Hemoglobin 05/27/2022 14.5    HCT 05/27/2022 44.3    MCV 05/27/2022 89.5    MCHC 05/27/2022 32.7    RDW 05/27/2022 15.0    Platelets 05/27/2022 251.0    Neutrophils Relative % 05/27/2022 48.1    Lymphocytes Relative 05/27/2022 32.8    Monocytes Relative 05/27/2022 11.5    Eosinophils Relative 05/27/2022 6.6 (H)    Basophils Relative 05/27/2022 1.0    Neutro Abs 05/27/2022 2.7    Lymphs Abs 05/27/2022 1.8    Monocytes Absolute 05/27/2022 0.6    Eosinophils Absolute 05/27/2022 0.4    Basophils Absolute 05/27/2022 0.1    Sodium 05/27/2022 142    Potassium 05/27/2022 4.6    Chloride 05/27/2022 101    CO2 05/27/2022 33 (H)    Glucose, Bld 05/27/2022 96    BUN 05/27/2022 15    Creatinine, Ser 05/27/2022 0.70    Total Bilirubin  05/27/2022 0.6    Alkaline Phosphatase 05/27/2022 90    AST 05/27/2022 23    ALT 05/27/2022 24    Total Protein 05/27/2022 7.1    Albumin 05/27/2022 3.8    GFR 05/27/2022 81.44    Calcium 05/27/2022 9.5    Cholesterol 05/27/2022 156    Triglycerides 05/27/2022 127.0    HDL 05/27/2022 45.10    VLDL 05/27/2022 25.4    LDL Cholesterol 05/27/2022 85    Total CHOL/HDL Ratio 05/27/2022 3    NonHDL 05/27/2022 110.52    VITD 05/27/2022 56.67   Lab on 02/10/2022  Component Date Value   Sodium 02/10/2022 141    Potassium 02/10/2022 4.5    Chloride  02/10/2022 101    CO2 02/10/2022 31    Glucose, Bld 02/10/2022 89    BUN 02/10/2022 15    Creatinine, Ser 02/10/2022 0.64    Total Bilirubin 02/10/2022 0.6    Alkaline Phosphatase 02/10/2022 87    AST 02/10/2022 21    ALT 02/10/2022 21    Total Protein 02/10/2022 7.2    Albumin 02/10/2022 4.1    GFR 02/10/2022 83.39    Calcium 02/10/2022 9.6       Assessment:      81 y.o. female with planned surgery as above.   Known risk factors for perioperative complications:  Difficulty with intubation is not anticipated.  Cardiac Risk Estimation: low  Current medications which may produce withdrawal symptoms if withheld perioperatively: no    Plan:    1. Preoperative workup as follows ECG, hemoglobin, hematocrit, electrolytes, creatinine, glucose, liver function studies, coagulation studies. 2. Change in medication regimen before surgery: none, continue medication regimen including morning of surgery, with sip of water. 3. Prophylaxis for cardiac events with perioperative beta-blockers: not indicated. 4. Invasive hemodynamic monitoring perioperatively: not indicated. 5. Deep vein thrombosis prophylaxis postoperatively:regimen to be chosen by surgical team. 6. Surveillance for postoperative MI with ECG immediately postoperatively and on postoperative days 1 and 2 AND troponin levels 24 hours postoperatively and on day 4 or hospital discharge (whichever comes first): not indicated.

## 2022-08-08 NOTE — Patient Instructions (Signed)
Cervical Fusion Cervical fusion is a procedure that is done on bones in the neck to relieve pressure on the spinal cord or on one or more nerve roots. The bones in the neck form the cervical spine. There are two types of cervical fusion: Posterior cervical fusion. This surgery is done through the back, or posterior, of the neck. The goal of this procedure is to join two or more of the bones in the spine (vertebrae). This procedure is most commonly used to treat neck fractures and dislocations and to fix deformities in the neck. Anterior cervical fusion. This surgery is done through the front, or anterior, of the neck. This procedure is most commonly used to treat a herniated cervical disk, degenerative cervical disease, and arthritis in the cervical spine. Tell a health care provider about: Any allergies you have. All medicines you are taking, including vitamins, herbs, eye drops, creams, and over-the-counter medicines. Any problems you or family members have had with anesthetic medicines. Any blood disorders you have. Any surgeries you have had. Any medical conditions you have. Whether you are pregnant or may be pregnant. What are the risks? Generally, this is a safe procedure. However, problems may occur, including: Infection. Bleeding. Damage to nearby structures or organs, such as nerves near the spine. Blood clots. Temporary hoarseness of the voice. Difficulty swallowing. Rare complications include: Spinal fluid leakage. Trouble controlling urination or bowel movements. Vertebrae not fusing together completely (pseudoarthrosis). Allergic reactions to medicines or dyes. What happens before the procedure? Staying hydrated Follow instructions from your health care provider about hydration, which may include: Up to 2 hours before the procedure - you may continue to drink clear liquids, such as water, clear fruit juice, black coffee, and plain tea.  Eating and drinking  restrictions Follow instructions from your health care provider about eating and drinking, which may include: 8 hours before the procedure - stop eating heavy meals or foods, such as meat, fried foods, or fatty foods. 6 hours before the procedure - stop eating light meals or foods, such as toast or cereal. 6 hours before the procedure - stop drinking milk or drinks that contain milk. 2 hours before the procedure - stop drinking clear liquids. Medicines Ask your health care provider about: Changing or stopping your regular medicines. This is especially important if you are taking diabetes medicines or blood thinners. Taking medicines such as aspirin and ibuprofen. These medicines can thin your blood. Do not take these medicines unless your health care provider tells you to take them. Taking over-the-counter medicines, vitamins, herbs, and supplements. Tests You may have tests and imaging done. These include: Blood and urine tests. X-ray, CT scan, or MRI. Surgery safety Ask your health care provider: How your surgery site will be marked. What steps will be taken to help prevent infection. These may include: Removing hair at the surgery site. Washing skin with a germ-killing soap. Receiving antibiotic medicine. General instructions Do not use any products that contain nicotine or tobacco for at least 4 weeks before the procedure. These products include cigarettes, e-cigarettes, and chewing tobacco. If you need help quitting, ask your health care provider. Plan to have a responsible adult take you home from the hospital or clinic. Plan to have a responsible adult care for you for the time you are told after you leave the hospital or clinic. This is important. What happens during the procedure?  An IV will be inserted into one of your veins. You will be given one or more   of the following: A medicine to help you relax (sedative). A medicine to make you fall asleep (general anesthetic). If  you are having a posterior cervical fusion: An incision will be made in the back of your neck. Two or more vertebrae will be joined into one solid section of bone with a bone graft. If you are having an anterior cervical fusion: An incision will be made in the area where the fusion will be placed. This is usually done at the front of your neck. Your neck muscles will be pushed aside. The affected disk and bone spurs will be removed (decompression). The area where the disk was removed will then be filled with bone graft or bone implants or both. Screws and rods or metal plates may be used to stabilize the vertebrae while they fuse. Your incision may be closed with stitches (sutures). A bandage (dressing) may be placed over your incision. The procedure may vary among health care providers and hospitals. What happens after the procedure? Your blood pressure, heart rate, breathing rate, and blood oxygen level will be monitored until you leave the hospital or clinic. You will be given medicine to relieve pain as needed. You will continue to receive fluids and medicines through an IV. You may be given a brace to wear while you heal. Do not drive until your health care provider approves. You may have to wear compression stockings. These stockings help to prevent blood clots and reduce swelling in your legs. You may be asked to do breathing exercises. This helps prevent a lung infection. You will be encouraged to stand up and walk as soon as you are able. You will be taught how to move correctly and how to stand and walk. You will be assisted to turn in bed frequently by moving your whole body without twisting your back (log rolling technique). Summary Cervical fusion is a procedure that is done on bones in the neck. The procedure is done to relieve pressure on the spinal cord or on nerve roots. Tell your health care provider about any medical conditions you have and the medicines you are taking for  them. Follow your health care provider's instructions before the procedure. You will be told when to stop eating and drinking, and whether to change or stop your medicines. After the procedure, you may be asked to do breathing exercises. You may also be encouraged to start walking. Plan to have someone take you home from the hospital or clinic. Do not drive until your health care provider approves. This information is not intended to replace advice given to you by your health care provider. Make sure you discuss any questions you have with your health care provider. Document Revised: 03/14/2020 Document Reviewed: 03/14/2020 Elsevier Patient Education  2023 [employer]  

## 2022-08-09 LAB — CBC WITH DIFFERENTIAL/PLATELET
Absolute Monocytes: 480 cells/uL (ref 200–950)
Basophils Absolute: 58 cells/uL (ref 0–200)
Basophils Relative: 0.9 %
Eosinophils Absolute: 333 cells/uL (ref 15–500)
Eosinophils Relative: 5.2 %
HCT: 41.9 % (ref 35.0–45.0)
Hemoglobin: 13.9 g/dL (ref 11.7–15.5)
Lymphs Abs: 1894 cells/uL (ref 850–3900)
MCH: 29.3 pg (ref 27.0–33.0)
MCHC: 33.2 g/dL (ref 32.0–36.0)
MCV: 88.4 fL (ref 80.0–100.0)
MPV: 11.7 fL (ref 7.5–12.5)
Monocytes Relative: 7.5 %
Neutro Abs: 3635 cells/uL (ref 1500–7800)
Neutrophils Relative %: 56.8 %
Platelets: 238 10*3/uL (ref 140–400)
RBC: 4.74 10*6/uL (ref 3.80–5.10)
RDW: 13.3 % (ref 11.0–15.0)
Total Lymphocyte: 29.6 %
WBC: 6.4 10*3/uL (ref 3.8–10.8)

## 2022-08-09 LAB — COMPREHENSIVE METABOLIC PANEL
AG Ratio: 1.4 (calc) (ref 1.0–2.5)
ALT: 21 U/L (ref 6–29)
AST: 20 U/L (ref 10–35)
Albumin: 3.9 g/dL (ref 3.6–5.1)
Alkaline phosphatase (APISO): 79 U/L (ref 37–153)
BUN: 19 mg/dL (ref 7–25)
CO2: 30 mmol/L (ref 20–32)
Calcium: 9.7 mg/dL (ref 8.6–10.4)
Chloride: 103 mmol/L (ref 98–110)
Creat: 0.76 mg/dL (ref 0.60–0.95)
Globulin: 2.8 g/dL (calc) (ref 1.9–3.7)
Glucose, Bld: 121 mg/dL — ABNORMAL HIGH (ref 65–99)
Potassium: 4.3 mmol/L (ref 3.5–5.3)
Sodium: 142 mmol/L (ref 135–146)
Total Bilirubin: 0.5 mg/dL (ref 0.2–1.2)
Total Protein: 6.7 g/dL (ref 6.1–8.1)

## 2022-08-09 LAB — LIPID PANEL
Cholesterol: 154 mg/dL (ref <200)
HDL: 49 mg/dL — ABNORMAL LOW (ref >=50)
LDL Cholesterol (Calc): 80 mg/dL (calc)
Non-HDL Cholesterol (Calc): 105 mg/dL (calc) (ref <130)
Total CHOL/HDL Ratio: 3.1 (calc) (ref <5.0)
Triglycerides: 146 mg/dL (ref <150)

## 2022-08-09 LAB — PROTIME-INR
INR: 1
Prothrombin Time: 10.4 s (ref 9.0–11.5)

## 2022-08-09 LAB — APTT: aPTT: 30 s (ref 23–32)

## 2022-08-25 DIAGNOSIS — Z20822 Contact with and (suspected) exposure to covid-19: Secondary | ICD-10-CM | POA: Diagnosis not present

## 2022-08-25 DIAGNOSIS — J3489 Other specified disorders of nose and nasal sinuses: Secondary | ICD-10-CM | POA: Diagnosis not present

## 2022-08-25 DIAGNOSIS — J309 Allergic rhinitis, unspecified: Secondary | ICD-10-CM | POA: Diagnosis not present

## 2022-08-26 DIAGNOSIS — M4712 Other spondylosis with myelopathy, cervical region: Secondary | ICD-10-CM | POA: Diagnosis not present

## 2022-08-28 ENCOUNTER — Other Ambulatory Visit: Payer: Self-pay | Admitting: Neurosurgery

## 2022-08-28 DIAGNOSIS — T884XXA Failed or difficult intubation, initial encounter: Secondary | ICD-10-CM | POA: Diagnosis not present

## 2022-08-28 DIAGNOSIS — Z5309 Procedure and treatment not carried out because of other contraindication: Secondary | ICD-10-CM | POA: Diagnosis not present

## 2022-08-29 ENCOUNTER — Other Ambulatory Visit (HOSPITAL_BASED_OUTPATIENT_CLINIC_OR_DEPARTMENT_OTHER): Payer: Self-pay

## 2022-08-29 ENCOUNTER — Other Ambulatory Visit: Payer: Self-pay | Admitting: Neurosurgery

## 2022-08-29 ENCOUNTER — Encounter: Payer: Self-pay | Admitting: Internal Medicine

## 2022-08-29 ENCOUNTER — Ambulatory Visit (INDEPENDENT_AMBULATORY_CARE_PROVIDER_SITE_OTHER): Payer: Medicare HMO | Admitting: Internal Medicine

## 2022-08-29 VITALS — BP 138/84 | HR 86 | Temp 97.7°F | Resp 18 | Ht 60.0 in | Wt 163.5 lb

## 2022-08-29 DIAGNOSIS — J020 Streptococcal pharyngitis: Secondary | ICD-10-CM | POA: Diagnosis not present

## 2022-08-29 LAB — POCT RAPID STREP A (OFFICE): Rapid Strep A Screen: POSITIVE — AB

## 2022-08-29 LAB — POC COVID19 BINAXNOW: SARS Coronavirus 2 Ag: NEGATIVE

## 2022-08-29 MED ORDER — AMOXICILLIN 875 MG PO TABS
875.0000 mg | ORAL_TABLET | Freq: Two times a day (BID) | ORAL | 0 refills | Status: DC
  Filled 2022-08-29: qty 20, 10d supply, fill #0

## 2022-08-29 MED ORDER — ALBUTEROL SULFATE HFA 108 (90 BASE) MCG/ACT IN AERS
2.0000 | INHALATION_SPRAY | Freq: Four times a day (QID) | RESPIRATORY_TRACT | 2 refills | Status: DC | PRN
  Filled 2022-08-29: qty 6.7, 25d supply, fill #0

## 2022-08-29 NOTE — Patient Instructions (Signed)
Continue Flonase 2 sprays on each side of the nose daily  Tylenol as needed  Amoxicillin x10 stay  Get albuterol only if you have wheezing or chest congestion.  Call if not gradually better

## 2022-08-29 NOTE — Progress Notes (Unsigned)
Subjective:    Patient ID: Cindy Velasquez, female    DOB: 08-21-1941, 81 y.o.   MRN: [id number]  DOS:  08/29/2022 Type of visit - description: Acute, here with her daughter During the week and September 16 she was not feeling well. Nasal congestion, postnasal dripping, felt tired. No fever chills No cough No nausea vomiting. Went to another office, COVID test was negative, was told she probably had allergies or sinus infection. The patient elected to continue Flonase.  She presented for cervical spine surgery yesterday, at the OR she was attempted to be intubated and the throat was noted to be swollen, the intubation was very difficult, they noted some wheezing. She got Decadron, albuterol and Ancef (preop antibiotic). The surgery was canceled and was told to come here for further eval. They wonder if she has upper respiratory infection and also the patient was noted to be wheezing.  Today the patient feels simply tired and she has developed mild sore throat she thinks possibly due to the attempted intubation yesterday.  Review of Systems See above   Past Medical History:  Diagnosis Date   Adenomatous colon polyp    Cancer ([place]) 03/2015   BCC R tibia    Diverticulosis of colon    GERD (gastroesophageal reflux disease)    Hip pain    Hyperlipidemia    Hyperplastic colon polyp    Hypertension    Lactose intolerance    Loose stools    Lower back pain    Osteoporosis    Palpitations     Past Surgical History:  Procedure Laterality Date   EYE SURGERY  2012   B/L 01/2011  02/2011   HAMMER TOE SURGERY     Bilateral   ROTATOR CUFF REPAIR     Left   TUBAL LIGATION      Current Outpatient Medications  Medication Instructions   azelastine (ASTELIN) 0.1 % nasal spray 1 spray, Each Nare, 2 times daily, Use in each nostril as directed   clotrimazole-betamethasone (LOTRISONE) cream 1 application , Topical, 2 times daily, Use as needed for rash   fluticasone (FLONASE) 50 MCG/ACT  nasal spray 2 sprays, Each Nare, Daily   hydrochlorothiazide (HYDRODIURIL) 25 MG tablet TAKE 1/2 TABLET(12.5 MG) BY MOUTH EVERY MORNING   lisinopril (ZESTRIL) 20 MG tablet TAKE 1 TABLET BY MOUTH DAILY   MELATONIN ER PO Oral   Multiple Vitamins-Minerals (ICAPS AREDS 2) CAPS 2 capsules, Oral, Daily   Olopatadine HCl (PATADAY) 0.2 % SOLN 1 gtt each eye qd   rosuvastatin (CRESTOR) 20 MG tablet TAKE 1 TABLET BY MOUTH EVERY DAY   silver sulfADIAZINE (SILVADENE) 1 % cream Apply 1 application on to the skin daily.   terbinafine (LAMISIL) 250 mg, Oral, Daily   Vitamin D3 5,000 Units, Oral, Daily       Objective:   Physical Exam BP 138/84   Pulse 86   Temp 97.7 F (36.5 C) (Oral)   Resp 18   Ht 5' (1.524 m)   Wt 163 lb 8 oz (74.2 kg)   SpO2 95%   BMI 31.93 kg/m  General:   Well developed, NAD, BMI noted. HEENT:  Normocephalic . Face symmetric, atraumatic TMs: Partially visualized due to wax but normal Throat: Extremely crowded, I was able to visualize the soft palate and the uvula and they look slightly red.  No white patches.  No difficulty with respiration. Tongue has a bruise at the right anterior area. Lungs:  CTA B Normal respiratory  effort, no intercostal retractions, no accessory muscle use. Heart: RRR,  no murmur.  Lower extremities: no pretibial edema bilaterally  Skin: Not pale. Not jaundice Neurologic:  alert & oriented X3.  Speech normal, gait appropriate for age and unassisted Psych--  Cognition and judgment appear intact.  Cooperative with normal attention span and concentration.  Behavior appropriate. No anxious or depressed appearing.      Assessment     80 year old female, PMH includes HTN, high cholesterol, DJD here for an acute visit.  Pharyngitis The patient presented with above history, exam showed erythematous throat. Strep test +. COVID test: neg Plan:  Amoxicillin x10 days. Otherwise conservative treatment, see AVS. I printed a prescription for  albuterol in case she has any wheezing. Multiple questions asked regarding antibiotic use, albuterol (how to use it), she was given an incentive spirometry I also explained how to use it. 30 minutes

## 2022-09-05 NOTE — Progress Notes (Signed)
Surgical Instructions    Your procedure is scheduled on 09/11/22.  Report to [facility] Main Entrance "A" at 9:00 A.M., then check in with the Admitting office.  Call this number if you have problems the morning of surgery:  [phone]   If you have any questions prior to your surgery date call [phone]: Open Monday-Friday 8am-4pm If you experience any cold or flu symptoms such as cough, fever, chills, shortness of breath, etc. between now and your scheduled surgery, please notify us at the above number     Remember:  Do not eat or drink after midnight the night before your surgery     Take these medicines the morning of surgery with A SIP OF WATER:  amoxicillin (AMOXIL)  Olopatadine HCl (PATADAY) rosuvastatin (CRESTOR)  IF NEEDED: fluticasone (FLONASE)  As of today, STOP taking any Aspirin (unless otherwise instructed by your surgeon) Aleve, Naproxen, Ibuprofen, Motrin, Advil, Goody's, BC's, all herbal medications, fish oil, and all vitamins.           Do not wear jewelry or makeup. Do not wear lotions, powders, perfumes or deodorant. Do not shave 48 hours prior to surgery.   Do not bring valuables to the hospital. Do not wear nail polish, gel polish, artificial nails, or any other type of covering on natural nails (fingers and toes) If you have artificial nails or gel coating that need to be removed by a nail salon, please have this removed prior to surgery. Artificial nails or gel coating may interfere with anesthesia's ability to adequately monitor your vital signs.  [facility] is not responsible for any belongings or valuables.    Do NOT Smoke (Tobacco/Vaping)  24 hours prior to your procedure  If you use a CPAP at night, you may bring your mask for your overnight stay.   Contacts, glasses, hearing aids, dentures or partials may not be worn into surgery, please bring cases for these belongings   For patients admitted to the hospital, discharge time will be  determined by your treatment team.   Patients discharged the day of surgery will not be allowed to drive home, and someone needs to stay with them for 24 hours.   SURGICAL WAITING ROOM VISITATION Patients having surgery or a procedure may have no more than 2 support people in the waiting area - these visitors may rotate.   Children under the age of 54 must have an adult with them who is not the patient. If the patient needs to stay at the hospital during part of their recovery, the visitor guidelines for inpatient rooms apply. Pre-op nurse will coordinate an appropriate time for 1 support person to accompany patient in pre-op.  This support person may not rotate.   Please refer to the [facility] website for the visitor guidelines for Inpatients (after your surgery is over and you are in a regular room).    Special instructions:    Oral Hygiene is also important to reduce your risk of infection.  Remember - BRUSH YOUR TEETH THE MORNING OF SURGERY WITH YOUR REGULAR TOOTHPASTE   [place] [place]- Preparing For Surgery  Before surgery, you can play an important role. Because skin is not sterile, your skin needs to be as free of germs as possible. You can reduce the number of germs on your skin by washing with CHG (chlorahexidine gluconate) Soap before surgery.  CHG is an antiseptic cleaner which kills germs and bonds with the skin to continue killing germs even after washing.  Please do not use if you have an allergy to CHG or antibacterial soaps. If your skin becomes reddened/irritated stop using the CHG.  Do not shave (including legs and underarms) for at least 48 hours prior to first CHG shower. It is OK to shave your face.  Please follow these instructions carefully.     Shower the NIGHT BEFORE SURGERY and the MORNING OF SURGERY with CHG Soap.   If you chose to wash your hair, wash your hair first as usual with your normal shampoo. After you shampoo, rinse your hair and body thoroughly  to remove the shampoo.  Then [employer] and genitals (private parts) with your normal soap and rinse thoroughly to remove soap.  After that Use CHG Soap as you would any other liquid soap. You can apply CHG directly to the skin and wash gently with a scrungie or a clean washcloth.   Apply the CHG Soap to your body ONLY FROM THE NECK DOWN.  Do not use on open wounds or open sores. Avoid contact with your eyes, ears, mouth and genitals (private parts). Wash Face and genitals (private parts)  with your normal soap.   Wash thoroughly, paying special attention to the area where your surgery will be performed.  Thoroughly rinse your body with warm water from the neck down.  DO NOT shower/wash with your normal soap after using and rinsing off the CHG Soap.  Pat yourself dry with a CLEAN TOWEL.  Wear CLEAN PAJAMAS to bed the night before surgery  Place CLEAN SHEETS on your bed the night before your surgery  DO NOT SLEEP WITH PETS.   Day of Surgery: Take a shower with CHG soap. Wear Clean/Comfortable clothing the morning of surgery Do not apply any deodorants/lotions.   Remember to brush your teeth WITH YOUR REGULAR TOOTHPASTE.    If you received a COVID test during your pre-op visit, it is requested that you wear a mask when out in public, stay away from anyone that may not be feeling well, and notify your surgeon if you develop symptoms. If you have been in contact with anyone that has tested positive in the last 10 days, please notify your surgeon.    Please read over the following fact sheets that you were given.

## 2022-09-08 ENCOUNTER — Encounter (HOSPITAL_COMMUNITY)
Admission: RE | Admit: 2022-09-08 | Discharge: 2022-09-08 | Disposition: A | Discharge status: Home / Self Care | Payer: Medicare HMO | Source: Ambulatory Visit | Attending: Neurosurgery | Admitting: Neurosurgery

## 2022-09-08 ENCOUNTER — Encounter (HOSPITAL_COMMUNITY): Payer: Self-pay

## 2022-09-08 ENCOUNTER — Other Ambulatory Visit: Payer: Self-pay

## 2022-09-08 VITALS — BP 154/69 | HR 91 | Temp 97.9°F | Resp 17 | Ht 60.0 in | Wt 161.5 lb

## 2022-09-08 DIAGNOSIS — M4712 Other spondylosis with myelopathy, cervical region: Secondary | ICD-10-CM | POA: Diagnosis not present

## 2022-09-08 DIAGNOSIS — Z01812 Encounter for preprocedural laboratory examination: Secondary | ICD-10-CM | POA: Insufficient documentation

## 2022-09-08 HISTORY — DX: Failed or difficult intubation, initial encounter: T88.4XXA

## 2022-09-08 LAB — TYPE AND SCREEN
ABO/RH(D): A NEG
Antibody Screen: NEGATIVE

## 2022-09-08 LAB — SURGICAL PCR SCREEN
MRSA, PCR: NEGATIVE
Staphylococcus aureus: NEGATIVE

## 2022-09-08 NOTE — Progress Notes (Signed)
PCP - Roma Schanz DO Cardiologist - denies  PPM/ICD - denies  Chest x-ray - n/a EKG - 08/08/22 Stress Test - n/a ECHO - 07/16/17 Cardiac Cath - n/a  Sleep Study - denies  No diabetes.  As of today, STOP taking any Aspirin (unless otherwise instructed by your surgeon) Aleve, Naproxen, Ibuprofen, Motrin, Advil, Goody's, BC's, all herbal medications, fish oil, and all vitamins.  ERAS Protcol - No PRE-SURGERY Ensure or G2- N/A  COVID TEST- N/A   Anesthesia review: no.  Patient denies shortness of breath, fever, cough and chest pain at PAT appointment   All instructions explained to the patient, with a verbal understanding of the material. Patient agrees to go over the instructions while at home for a better understanding.  The opportunity to ask questions was provided.

## 2022-09-11 ENCOUNTER — Encounter (HOSPITAL_COMMUNITY): Payer: Self-pay

## 2022-09-11 ENCOUNTER — Other Ambulatory Visit: Payer: Self-pay

## 2022-09-11 ENCOUNTER — Encounter (HOSPITAL_COMMUNITY)
Admission: RE | Disposition: A | Discharge status: Home / Self Care | Payer: Self-pay | Source: Home / Self Care | Attending: Neurosurgery

## 2022-09-11 ENCOUNTER — Ambulatory Visit (HOSPITAL_COMMUNITY): Payer: Medicare HMO | Admitting: Certified Registered Nurse Anesthetist

## 2022-09-11 ENCOUNTER — Ambulatory Visit (HOSPITAL_COMMUNITY): Payer: Medicare HMO

## 2022-09-11 ENCOUNTER — Observation Stay (HOSPITAL_COMMUNITY)
Admission: RE | Admit: 2022-09-11 | Discharge: 2022-09-12 | Disposition: A | Discharge status: Home / Self Care | Payer: Medicare HMO | Attending: Neurosurgery | Admitting: Neurosurgery

## 2022-09-11 DIAGNOSIS — Z79899 Other long term (current) drug therapy: Secondary | ICD-10-CM | POA: Diagnosis not present

## 2022-09-11 DIAGNOSIS — Z87891 Personal history of nicotine dependence: Secondary | ICD-10-CM | POA: Diagnosis not present

## 2022-09-11 DIAGNOSIS — M4802 Spinal stenosis, cervical region: Secondary | ICD-10-CM

## 2022-09-11 DIAGNOSIS — Z85828 Personal history of other malignant neoplasm of skin: Secondary | ICD-10-CM | POA: Insufficient documentation

## 2022-09-11 DIAGNOSIS — I10 Essential (primary) hypertension: Secondary | ICD-10-CM | POA: Diagnosis not present

## 2022-09-11 DIAGNOSIS — G992 Myelopathy in diseases classified elsewhere: Secondary | ICD-10-CM | POA: Diagnosis not present

## 2022-09-11 DIAGNOSIS — M4322 Fusion of spine, cervical region: Principal | ICD-10-CM | POA: Diagnosis present

## 2022-09-11 DIAGNOSIS — G959 Disease of spinal cord, unspecified: Secondary | ICD-10-CM

## 2022-09-11 DIAGNOSIS — M50022 Cervical disc disorder at C5-C6 level with myelopathy: Secondary | ICD-10-CM | POA: Insufficient documentation

## 2022-09-11 DIAGNOSIS — M1990 Unspecified osteoarthritis, unspecified site: Secondary | ICD-10-CM | POA: Diagnosis not present

## 2022-09-11 DIAGNOSIS — Z981 Arthrodesis status: Secondary | ICD-10-CM | POA: Diagnosis not present

## 2022-09-11 HISTORY — PX: ANTERIOR CERVICAL DECOMP/DISCECTOMY FUSION: SHX1161

## 2022-09-11 LAB — POCT I-STAT, CHEM 8
BUN: 19 mg/dL (ref 8–23)
Calcium, Ion: 1.06 mmol/L — ABNORMAL LOW (ref 1.15–1.40)
Chloride: 104 mmol/L (ref 98–111)
Creatinine, Ser: 0.6 mg/dL (ref 0.44–1.00)
Glucose, Bld: 107 mg/dL — ABNORMAL HIGH (ref 70–99)
HCT: 44 % (ref 36.0–46.0)
Hemoglobin: 15 g/dL (ref 12.0–15.0)
Potassium: 4.1 mmol/L (ref 3.5–5.1)
Sodium: 140 mmol/L (ref 135–145)
TCO2: 29 mmol/L (ref 22–32)

## 2022-09-11 LAB — ABO/RH: ABO/RH(D): A NEG

## 2022-09-11 SURGERY — ANTERIOR CERVICAL DECOMPRESSION/DISCECTOMY FUSION 1 LEVEL
Anesthesia: General | Site: Spine Cervical

## 2022-09-11 MED ORDER — MELATONIN 3 MG PO TABS
3.0000 mg | ORAL_TABLET | Freq: Every day | ORAL | Status: DC
  Administered 2022-09-11: 3 mg via ORAL
  Filled 2022-09-11: qty 1

## 2022-09-11 MED ORDER — HYDROCHLOROTHIAZIDE 12.5 MG PO TABS: 12.5000 mg | ORAL_TABLET | Freq: Every day | ORAL | Status: DC

## 2022-09-11 MED ORDER — SUGAMMADEX SODIUM 200 MG/2ML IV SOLN
INTRAVENOUS | Status: DC | PRN
  Administered 2022-09-11: 200 mg via INTRAVENOUS

## 2022-09-11 MED ORDER — LIDOCAINE 2% (20 MG/ML) 5 ML SYRINGE
INTRAMUSCULAR | Status: DC | PRN
  Administered 2022-09-11: 60 mg via INTRAVENOUS

## 2022-09-11 MED ORDER — DOCUSATE SODIUM 100 MG PO CAPS
100.0000 mg | ORAL_CAPSULE | Freq: Two times a day (BID) | ORAL | Status: DC
  Administered 2022-09-11: 100 mg via ORAL
  Filled 2022-09-11: qty 1

## 2022-09-11 MED ORDER — ROCURONIUM BROMIDE 10 MG/ML (PF) SYRINGE
PREFILLED_SYRINGE | INTRAVENOUS | Status: DC | PRN
  Administered 2022-09-11: 30 mg via INTRAVENOUS
  Administered 2022-09-11: 40 mg via INTRAVENOUS

## 2022-09-11 MED ORDER — ALBUTEROL SULFATE HFA 108 (90 BASE) MCG/ACT IN AERS: 2.0000 | INHALATION_SPRAY | Freq: Four times a day (QID) | RESPIRATORY_TRACT | Status: DC | PRN

## 2022-09-11 MED ORDER — SODIUM CHLORIDE 0.9% FLUSH: 3.0000 mL | INTRAVENOUS | Status: DC | PRN

## 2022-09-11 MED ORDER — FLEET ENEMA 7-19 GM/118ML RE ENEM: 1.0000 | ENEMA | Freq: Once | RECTAL | Status: DC | PRN

## 2022-09-11 MED ORDER — ACETAMINOPHEN 500 MG PO TABS
1000.0000 mg | ORAL_TABLET | Freq: Once | ORAL | Status: AC
  Administered 2022-09-11: 1000 mg via ORAL
  Filled 2022-09-11: qty 2

## 2022-09-11 MED ORDER — ONDANSETRON HCL 4 MG/2ML IJ SOLN: 4.0000 mg | Freq: Four times a day (QID) | INTRAMUSCULAR | Status: DC | PRN

## 2022-09-11 MED ORDER — ACETAMINOPHEN 325 MG PO TABS: 650.0000 mg | ORAL_TABLET | ORAL | Status: DC | PRN

## 2022-09-11 MED ORDER — THROMBIN 5000 UNITS EX SOLR
CUTANEOUS | Status: AC
  Filled 2022-09-11: qty 5000

## 2022-09-11 MED ORDER — DEXAMETHASONE SODIUM PHOSPHATE 10 MG/ML IJ SOLN
INTRAMUSCULAR | Status: AC
  Filled 2022-09-11: qty 1

## 2022-09-11 MED ORDER — FENTANYL CITRATE (PF) 250 MCG/5ML IJ SOLN
INTRAMUSCULAR | Status: AC
  Filled 2022-09-11: qty 5

## 2022-09-11 MED ORDER — DEXAMETHASONE SODIUM PHOSPHATE 10 MG/ML IJ SOLN
INTRAMUSCULAR | Status: DC | PRN
  Administered 2022-09-11: 10 mg via INTRAVENOUS

## 2022-09-11 MED ORDER — CYCLOBENZAPRINE HCL 10 MG PO TABS
10.0000 mg | ORAL_TABLET | Freq: Three times a day (TID) | ORAL | Status: DC | PRN
  Administered 2022-09-11 – 2022-09-12 (×2): 10 mg via ORAL
  Filled 2022-09-11 (×2): qty 1

## 2022-09-11 MED ORDER — PROPOFOL 10 MG/ML IV BOLUS
INTRAVENOUS | Status: DC | PRN
  Administered 2022-09-11 (×3): 50 mg via INTRAVENOUS
  Administered 2022-09-11: 100 mg via INTRAVENOUS
  Administered 2022-09-11: 50 mg via INTRAVENOUS

## 2022-09-11 MED ORDER — LIDOCAINE-EPINEPHRINE 1 %-1:100000 IJ SOLN
INTRAMUSCULAR | Status: DC | PRN
  Administered 2022-09-11: 7 mL

## 2022-09-11 MED ORDER — MENTHOL 3 MG MT LOZG
1.0000 | LOZENGE | OROMUCOSAL | Status: DC | PRN
  Administered 2022-09-11: 3 mg via ORAL
  Filled 2022-09-11: qty 9

## 2022-09-11 MED ORDER — CEFAZOLIN SODIUM-DEXTROSE 1-4 GM/50ML-% IV SOLN
1.0000 g | Freq: Three times a day (TID) | INTRAVENOUS | Status: DC
  Administered 2022-09-11 – 2022-09-12 (×2): 1 g via INTRAVENOUS
  Filled 2022-09-11 (×2): qty 50

## 2022-09-11 MED ORDER — LIDOCAINE-EPINEPHRINE 1 %-1:100000 IJ SOLN
INTRAMUSCULAR | Status: AC
  Filled 2022-09-11: qty 1

## 2022-09-11 MED ORDER — AMISULPRIDE (ANTIEMETIC) 5 MG/2ML IV SOLN: 10.0000 mg | Freq: Once | INTRAVENOUS | Status: DC | PRN

## 2022-09-11 MED ORDER — ONDANSETRON HCL 4 MG/2ML IJ SOLN
INTRAMUSCULAR | Status: AC
  Filled 2022-09-11: qty 2

## 2022-09-11 MED ORDER — ONDANSETRON HCL 4 MG PO TABS: 4.0000 mg | ORAL_TABLET | Freq: Four times a day (QID) | ORAL | Status: DC | PRN

## 2022-09-11 MED ORDER — HYDROMORPHONE HCL 1 MG/ML IJ SOLN: 0.5000 mg | INTRAMUSCULAR | Status: DC | PRN

## 2022-09-11 MED ORDER — SODIUM CHLORIDE 0.9 % IV SOLN
250.0000 mL | INTRAVENOUS | Status: DC
  Administered 2022-09-11: 250 mL via INTRAVENOUS

## 2022-09-11 MED ORDER — CEFAZOLIN SODIUM-DEXTROSE 2-4 GM/100ML-% IV SOLN
2.0000 g | INTRAVENOUS | Status: AC
  Administered 2022-09-11: 2 g via INTRAVENOUS
  Filled 2022-09-11: qty 100

## 2022-09-11 MED ORDER — AZELASTINE HCL 0.1 % NA SOLN: 1.0000 | Freq: Two times a day (BID) | NASAL | Status: DC

## 2022-09-11 MED ORDER — OXYCODONE HCL 5 MG PO TABS: 10.0000 mg | ORAL_TABLET | ORAL | Status: DC | PRN

## 2022-09-11 MED ORDER — CHLORHEXIDINE GLUCONATE 0.12 % MT SOLN
15.0000 mL | Freq: Once | OROMUCOSAL | Status: AC
  Administered 2022-09-11: 15 mL via OROMUCOSAL
  Filled 2022-09-11: qty 15

## 2022-09-11 MED ORDER — THROMBIN 5000 UNITS EX SOLR: OROMUCOSAL | Status: DC | PRN

## 2022-09-11 MED ORDER — TERBINAFINE HCL 250 MG PO TABS: 250.0000 mg | ORAL_TABLET | Freq: Every day | ORAL | Status: DC

## 2022-09-11 MED ORDER — OLOPATADINE HCL 0.1 % OP SOLN
1.0000 [drp] | Freq: Two times a day (BID) | OPHTHALMIC | Status: DC
  Administered 2022-09-11: 1 [drp] via OPHTHALMIC
  Filled 2022-09-11: qty 5

## 2022-09-11 MED ORDER — CHLORHEXIDINE GLUCONATE CLOTH 2 % EX PADS: 6.0000 | MEDICATED_PAD | Freq: Once | CUTANEOUS | Status: DC

## 2022-09-11 MED ORDER — LACTATED RINGERS IV SOLN: INTRAVENOUS | Status: DC

## 2022-09-11 MED ORDER — CULTURELLE DIGESTIVE DAILY PO CAPS: 1.0000 | ORAL_CAPSULE | Freq: Every day | ORAL | Status: DC

## 2022-09-11 MED ORDER — FENTANYL CITRATE (PF) 100 MCG/2ML IJ SOLN
INTRAMUSCULAR | Status: AC
  Filled 2022-09-11: qty 2

## 2022-09-11 MED ORDER — ONDANSETRON HCL 4 MG/2ML IJ SOLN
INTRAMUSCULAR | Status: DC | PRN
  Administered 2022-09-11: 4 mg via INTRAVENOUS

## 2022-09-11 MED ORDER — POTASSIUM CHLORIDE IN NACL 20-0.9 MEQ/L-% IV SOLN: INTRAVENOUS | Status: DC

## 2022-09-11 MED ORDER — FENTANYL CITRATE (PF) 250 MCG/5ML IJ SOLN
INTRAMUSCULAR | Status: DC | PRN
  Administered 2022-09-11 (×4): 50 ug via INTRAVENOUS

## 2022-09-11 MED ORDER — PROPOFOL 10 MG/ML IV BOLUS
INTRAVENOUS | Status: AC
  Filled 2022-09-11: qty 20

## 2022-09-11 MED ORDER — ACETAMINOPHEN 500 MG PO TABS
1000.0000 mg | ORAL_TABLET | Freq: Four times a day (QID) | ORAL | Status: DC
  Administered 2022-09-11 – 2022-09-12 (×3): 1000 mg via ORAL
  Filled 2022-09-11 (×3): qty 2

## 2022-09-11 MED ORDER — PHENOL 1.4 % MT LIQD: 1.0000 | OROMUCOSAL | Status: DC | PRN

## 2022-09-11 MED ORDER — LISINOPRIL 20 MG PO TABS: 20.0000 mg | ORAL_TABLET | Freq: Every day | ORAL | Status: DC

## 2022-09-11 MED ORDER — DEXAMETHASONE 4 MG PO TABS
4.0000 mg | ORAL_TABLET | Freq: Four times a day (QID) | ORAL | Status: DC
  Administered 2022-09-11 – 2022-09-12 (×3): 4 mg via ORAL
  Filled 2022-09-11 (×3): qty 1

## 2022-09-11 MED ORDER — FLUTICASONE PROPIONATE 50 MCG/ACT NA SUSP
2.0000 | Freq: Every day | NASAL | Status: DC | PRN
  Filled 2022-09-11: qty 16

## 2022-09-11 MED ORDER — VITAMIN D 25 MCG (1000 UNIT) PO TABS: 5000.0000 [IU] | ORAL_TABLET | Freq: Every day | ORAL | Status: DC

## 2022-09-11 MED ORDER — POLYETHYLENE GLYCOL 3350 17 G PO PACK: 17.0000 g | PACK | Freq: Every day | ORAL | Status: DC | PRN

## 2022-09-11 MED ORDER — 0.9 % SODIUM CHLORIDE (POUR BTL) OPTIME
TOPICAL | Status: DC | PRN
  Administered 2022-09-11: 1000 mL

## 2022-09-11 MED ORDER — ROSUVASTATIN CALCIUM 20 MG PO TABS: 20.0000 mg | ORAL_TABLET | Freq: Every day | ORAL | Status: DC

## 2022-09-11 MED ORDER — DEXAMETHASONE SODIUM PHOSPHATE 4 MG/ML IJ SOLN: 4.0000 mg | Freq: Four times a day (QID) | INTRAMUSCULAR | Status: DC

## 2022-09-11 MED ORDER — OXYCODONE HCL 5 MG PO TABS: 5.0000 mg | ORAL_TABLET | ORAL | Status: DC | PRN

## 2022-09-11 MED ORDER — ORAL CARE MOUTH RINSE: 15.0000 mL | Freq: Once | OROMUCOSAL | Status: AC

## 2022-09-11 MED ORDER — PHENYLEPHRINE HCL-NACL 20-0.9 MG/250ML-% IV SOLN
INTRAVENOUS | Status: DC | PRN
  Administered 2022-09-11: 25 ug/min via INTRAVENOUS

## 2022-09-11 MED ORDER — SODIUM CHLORIDE 0.9% FLUSH
3.0000 mL | Freq: Two times a day (BID) | INTRAVENOUS | Status: DC
  Administered 2022-09-11: 3 mL via INTRAVENOUS

## 2022-09-11 MED ORDER — FENTANYL CITRATE (PF) 100 MCG/2ML IJ SOLN
25.0000 ug | INTRAMUSCULAR | Status: DC | PRN
  Administered 2022-09-11: 25 ug via INTRAVENOUS
  Administered 2022-09-11: 50 ug via INTRAVENOUS

## 2022-09-11 MED ORDER — ACETAMINOPHEN 650 MG RE SUPP: 650.0000 mg | RECTAL | Status: DC | PRN

## 2022-09-11 MED ORDER — PHENYLEPHRINE 80 MCG/ML (10ML) SYRINGE FOR IV PUSH (FOR BLOOD PRESSURE SUPPORT)
PREFILLED_SYRINGE | INTRAVENOUS | Status: DC | PRN
  Administered 2022-09-11: 160 ug via INTRAVENOUS

## 2022-09-11 SURGICAL SUPPLY — 70 items
APL SKNCLS STERI-STRIP NONHPOA (GAUZE/BANDAGES/DRESSINGS) ×1
BAG COUNTER SPONGE SURGICOUNT (BAG) ×1 IMPLANT
BAG SPNG CNTER NS LX DISP (BAG) ×1
BAND INSRT 18 STRL LF DISP RB (MISCELLANEOUS) ×2
BAND RUBBER #18 3X1/16 STRL (MISCELLANEOUS) ×2 IMPLANT
BASKET BONE COLLECTION (BASKET) IMPLANT
BENZOIN TINCTURE PRP APPL 2/3 (GAUZE/BANDAGES/DRESSINGS) ×1 IMPLANT
BIT DRILL NEURO 2X3.1 SFT TUCH (MISCELLANEOUS) ×1 IMPLANT
BLADE CLIPPER SURG (BLADE) IMPLANT
BLADE SURG 15 STRL LF DISP TIS (BLADE) IMPLANT
BLADE SURG 15 STRL SS (BLADE)
BLADE ULTRA TIP 2M (BLADE) IMPLANT
BUR MATCHSTICK NEURO 3.0 LAGG (BURR) ×1 IMPLANT
CANISTER SUCT 3000ML PPV (MISCELLANEOUS) ×1 IMPLANT
COLLAR CERV LO CONTOUR FIRM DE (SOFTGOODS) ×1 IMPLANT
DEVICE ENDSKLTN TC NANOLCK 6MM (Cage) IMPLANT
DRAPE C-ARM 42X72 X-RAY (DRAPES) ×2 IMPLANT
DRAPE LAPAROTOMY 100X72 PEDS (DRAPES) ×1 IMPLANT
DRAPE MICROSCOPE SLANT 54X150 (MISCELLANEOUS) ×1 IMPLANT
DRAPE SHEET LG 3/4 BI-LAMINATE (DRAPES) ×1 IMPLANT
DRESSING MEPILEX FLEX 4X4 (GAUZE/BANDAGES/DRESSINGS) IMPLANT
DRILL NEURO 2X3.1 SOFT TOUCH (MISCELLANEOUS) ×1
DRSG MEPILEX FLEX 4X4 (GAUZE/BANDAGES/DRESSINGS)
DRSG OPSITE 4X5.5 SM (GAUZE/BANDAGES/DRESSINGS) ×2 IMPLANT
DRSG OPSITE POSTOP 3X4 (GAUZE/BANDAGES/DRESSINGS) ×1 IMPLANT
DRSG OPSITE POSTOP 4X6 (GAUZE/BANDAGES/DRESSINGS) IMPLANT
DURAPREP 26ML APPLICATOR (WOUND CARE) ×1 IMPLANT
ELECT COATED BLADE 2.86 ST (ELECTRODE) ×1 IMPLANT
ELECT REM PT RETURN 9FT ADLT (ELECTROSURGICAL) ×1
ELECTRODE REM PT RTRN 9FT ADLT (ELECTROSURGICAL) ×1 IMPLANT
ENDOSKELETON TC NANOLOCK 6MM (Cage) ×1 IMPLANT
GAUZE 4X4 16PLY ~~LOC~~+RFID DBL (SPONGE) IMPLANT
GLOVE BIOGEL PI IND STRL 7.5 (GLOVE) ×1 IMPLANT
GLOVE ECLIPSE 7.5 STRL STRAW (GLOVE) ×1 IMPLANT
GLOVE SURG ENC MOIS LTX SZ8 (GLOVE) ×1 IMPLANT
GLOVE SURG UNDER POLY LF SZ8.5 (GLOVE) ×1 IMPLANT
GOWN STRL REUS W/ TWL LRG LVL3 (GOWN DISPOSABLE) ×2 IMPLANT
GOWN STRL REUS W/ TWL XL LVL3 (GOWN DISPOSABLE) ×2 IMPLANT
GOWN STRL REUS W/TWL 2XL LVL3 (GOWN DISPOSABLE) IMPLANT
GOWN STRL REUS W/TWL LRG LVL3 (GOWN DISPOSABLE) ×2
GOWN STRL REUS W/TWL XL LVL3 (GOWN DISPOSABLE) ×2
HEMOSTAT POWDER KIT SURGIFOAM (HEMOSTASIS) ×1 IMPLANT
KIT BASIN OR (CUSTOM PROCEDURE TRAY) ×1 IMPLANT
KIT TURNOVER KIT B (KITS) ×1 IMPLANT
NDL SPNL 22GX3.5 QUINCKE BK (NEEDLE) ×1 IMPLANT
NEEDLE HYPO 22GX1.5 SAFETY (NEEDLE) ×1 IMPLANT
NEEDLE SPNL 22GX3.5 QUINCKE BK (NEEDLE) ×1 IMPLANT
NS IRRIG 1000ML POUR BTL (IV SOLUTION) ×1 IMPLANT
PACK LAMINECTOMY NEURO (CUSTOM PROCEDURE TRAY) ×1 IMPLANT
PAD ARMBOARD 7.5X6 YLW CONV (MISCELLANEOUS) ×3 IMPLANT
PATTIES SURGICAL .5 X3 (DISPOSABLE) ×1 IMPLANT
PIN DISTRACTION 14MM (PIN) ×2 IMPLANT
PLATE ZEVO 1LVL 19MM (Plate) IMPLANT
PUTTY DBF 1CC CORTICAL FIBERS (Putty) IMPLANT
SCREW CERV SD VA 3.5X14 (Screw) IMPLANT
SPIKE FLUID TRANSFER (MISCELLANEOUS) ×1 IMPLANT
SPONGE INTESTINAL PEANUT (DISPOSABLE) ×1 IMPLANT
SPONGE SURGIFOAM ABS GEL SZ50 (HEMOSTASIS) IMPLANT
STAPLER VISISTAT 35W (STAPLE) IMPLANT
STRIP CLOSURE SKIN 1/2X4 (GAUZE/BANDAGES/DRESSINGS) ×1 IMPLANT
SUT MNCRL AB 4-0 PS2 18 (SUTURE) ×1 IMPLANT
SUT SILK 2 0 TIES 10X30 (SUTURE) IMPLANT
SUT VIC AB 0 CT1 27 (SUTURE)
SUT VIC AB 0 CT1 27XBRD ANTBC (SUTURE) IMPLANT
SUT VIC AB 2-0 CP2 18 (SUTURE) IMPLANT
SUT VIC AB 3-0 SH 8-18 (SUTURE) ×1 IMPLANT
TAPE CLOTH 3X10 TAN LF (GAUZE/BANDAGES/DRESSINGS) ×1 IMPLANT
TOWEL GREEN STERILE (TOWEL DISPOSABLE) ×1 IMPLANT
TOWEL GREEN STERILE FF (TOWEL DISPOSABLE) ×1 IMPLANT
WATER STERILE IRR 1000ML POUR (IV SOLUTION) ×1 IMPLANT

## 2022-09-11 NOTE — Transfer of Care (Signed)
Immediate Anesthesia Transfer of Care Note  Patient: Cindy Velasquez  Procedure(s) Performed: CERVICAL FIVE-SIX ANTERIOR CERVICAL DECOMPRESSION/DISCECTOMY FUSION (Spine Cervical)  Patient Location: PACU  Anesthesia Type:General  Level of Consciousness: drowsy and patient cooperative  Airway & Oxygen Therapy: Patient Spontanous Breathing and Patient connected to face mask oxygen  Post-op Assessment: Report given to RN and Post -op Vital signs reviewed and stable  Post vital signs: Reviewed and stable  Last Vitals:  Vitals Value Taken Time  BP 175/67 09/11/22 1347  Temp    Pulse 78 09/11/22 1351  Resp 11 09/11/22 1351  SpO2 97 % 09/11/22 1351  Vitals shown include unvalidated device data.  Last Pain:  Vitals:   09/11/22 0946  TempSrc:   PainSc: 0-No pain         Complications:  Encounter Notable Events  Notable Event Outcome Phase Comment  Difficult to intubate - expected  Intraprocedure Filed from anesthesia note documentation.

## 2022-09-11 NOTE — Op Note (Signed)
PREOP DIAGNOSIS: Cervical stenosis with myelopathy  POSTOP DIAGNOSIS: Cervical stenosis with myelopathy  PROCEDURE: 1. Arthrodesis C5-6, anterior interbody technique, including Discectomy for decompression of spinal cord and exiting nerve roots with foraminotomies  2. Placement of intervertebral biomechanical device C5-6 3. Placement of anterior instrumentation consisting of interbody plate and screws V7-7 4. Use of morselized bone allograft  5. Use of intraoperative microscope  SURGEON: Dr. Duffy Rhody, MD  ASSISTANT: Weston Brass, NP.  Please note, no qualified trainees were available to assist with the procedure.  Assistance was required for retraction of the visceral structures to safely allow for instrumentation.  ANESTHESIA: General Endotracheal  EBL: 25 ml  IMPLANTS: [employer] 6 x 16 x 14 mm Titian-C cage 17 mm Zevo plate 14 mm screws x 4 DBF  SPECIMENS: None  DRAINS: None  COMPLICATIONS: None immediate  CONDITION: Hemodynamically stable to PACU  HISTORY: Cindy Velasquez is a 81 y.o. y.o. female who developed progressive myelopathy and was found to have severe cervical stenosis with myelopathy at C5-6.  Risks, benefits, alternatives, and expected convalescence were discussed with the patient.  Risks discussed included but were not limited to bleeding, pain, infection, dysphagia, dysphonia, pseudoarthrosis, hardware failure, adjacent segment disease, CSF leak, neurologic deficits, weakness, numbness, paralysis, coma, and death. After all questions were answered, informed consent was obtained.  PROCEDURE IN DETAIL: The patient was brought to the operating room and transferred to the operative table. After induction of general anesthesia, the patient was positioned on the operative table in the supine position with all pressure points meticulously padded. The skin of the neck was then prepped and draped in the usual sterile fashion.  After timeout was conducted, the  skin was infiltrated with local anesthetic. Left anterior skin incision was then made sharply and Bovie electrocautery was used to dissect the subcutaneous tissue until the platysma was identified. The platysma was then divided and undermined. The sternocleidomastoid muscle was then identified and, utilizing natural fascial planes in the neck, the prevertebral fascia was identified and the carotid sheath was retracted laterally and the trachea and esophagus retracted medially. Again using fluoroscopy, the C5-6 disc space was identified. Bovie electrocautery was used to dissect in the subperiosteal plane and elevate the bilateral longus coli muscles. Self-retaining retractors were then placed. Caspar distraction pins were placed in the adjacent bodies to allow for gentle distraction.  At this point, the microscope was draped and brought into the field, and the remainder of the case was done under the microscope using microdissecting technique.  The disc space was incised sharply and combination of high speed drill, curettes, and rongeurs were use to initially complete a discectomy. The high-speed drill was then used to complete discectomy until the posterior annulus was identified and removed and the posterior longitudinal ligament was identified. Using a nerve hook, the PLL was elevated, and Kerrison rongeurs were used to remove the posterior longitudinal ligament and the ventral thecal sac was identified. The PLL was heavily calcified and adherent to the dura.  Using a combination of curettes and rongeurs, complete decompression of the thecal sac and exiting nerve roots at this level was completed, and verified with easy passage of micro-nerve hook centrally and in the bilateral foramina.  Having completed our decompression, attention was turned to placement of the intervertebral device. Trial spacers were used to select a size 6 mm graft. This graft was then filled with morcellized allograft, and inserted  under live fluoroscopy.  After placement of the intervertebral devices, the caspar  pins were removed.  An anterior cervical plate was placed across the interspaces for anterior fixation.  Using a high-speed drill, the cortex of the cervical vertebral bodies was punctured, and screws inserted in the vertebral bodies. Final fluoroscopic images in AP and lateral projections were taken to confirm good hardware placement.  At this point, after all counts were verified to be correct, meticulous hemostasis was secured using a combination of bipolar electrocautery and passive hemostatics. The platysma muscle was then closed using interrupted 3-0 Vicryl sutures, and the skin was closed with a 4-0 monocryl in subcutical fashion. Sterile dressings were then applied and the drapes removed.  The patient tolerated the procedure well and was extubated in the room and taken to the postanesthesia care unit in stable condition.  All counts were correct at the end of the procedure.

## 2022-09-11 NOTE — Anesthesia Preprocedure Evaluation (Signed)
Anesthesia Evaluation  Patient identified by MRN, date of birth, ID band Patient awake    Reviewed: Allergy & Precautions, NPO status , Patient's Chart, lab work & pertinent test results  Airway Mallampati: II  TM Distance: >3 FB Neck ROM: Full    Dental  (+) Dental Advisory Given   Pulmonary former smoker,    breath sounds clear to auscultation       Cardiovascular hypertension, Pt. on medications  Rhythm:Regular Rate:Normal     Neuro/Psych  Neuromuscular disease    GI/Hepatic Neg liver ROS, GERD  ,  Endo/Other  negative endocrine ROS  Renal/GU negative Renal ROS     Musculoskeletal  (+) Arthritis ,   Abdominal   Peds  Hematology negative hematology ROS (+)   Anesthesia Other Findings   Reproductive/Obstetrics                             Lab Results  Component Value Date   WBC 6.4 08/08/2022   HGB 15.0 09/11/2022   HCT 44.0 09/11/2022   MCV 88.4 08/08/2022   PLT 238 08/08/2022   Lab Results  Component Value Date   CREATININE 0.60 09/11/2022   BUN 19 09/11/2022   NA 140 09/11/2022   K 4.1 09/11/2022   CL 104 09/11/2022   CO2 30 08/08/2022    Anesthesia Physical Anesthesia Plan  ASA: 2  Anesthesia Plan: General   Post-op Pain Management: Tylenol PO (pre-op)*   Induction: Intravenous  PONV Risk Score and Plan: 3 and Dexamethasone, Ondansetron and Treatment may vary due to age or medical condition  Airway Management Planned: Oral ETT and Video Laryngoscope Planned  Additional Equipment: None  Intra-op Plan:   Post-operative Plan: Extubation in OR  Informed Consent: I have reviewed the patients History and Physical, chart, labs and discussed the procedure including the risks, benefits and alternatives for the proposed anesthesia with the patient or authorized representative who has indicated his/her understanding and acceptance.     Dental advisory given  Plan  Discussed with: CRNA  Anesthesia Plan Comments:         Anesthesia Quick Evaluation

## 2022-09-11 NOTE — Anesthesia Procedure Notes (Signed)
Procedure Name: Intubation Date/Time: 09/11/2022 11:55 AM  Performed by: Suzette Battiest, MDPre-anesthesia Checklist: Patient identified, Emergency Drugs available, Suction available and Patient being monitored Patient Re-evaluated:Patient Re-evaluated prior to induction Oxygen Delivery Method: Circle system utilized Preoxygenation: Pre-oxygenation with 100% oxygen Induction Type: IV induction Ventilation: Oral airway inserted - appropriate to patient size and Two handed mask ventilation required Laryngoscope Size: Glidescope Grade View: Grade II Tube type: Oral Tube size: 6.5 mm Number of attempts: 4 Airway Equipment and Method: Oral airway, Video-laryngoscopy, Fiberoptic brochoscope and Rigid stylet Placement Confirmation: ETT inserted through vocal cords under direct vision, positive ETCO2 and breath sounds checked- equal and bilateral Secured at: 21 cm Tube secured with: Tape Dental Injury: Teeth and Oropharynx as per pre-operative assessment  Difficulty Due To: Difficulty was anticipated, Difficult Airway- due to anterior larynx, Difficult Airway- due to large tongue, Difficult Airway- due to reduced neck mobility and Difficult Airway- due to limited oral opening Future Recommendations: Recommend- induction with short-acting agent, and alternative techniques readily available Comments: Pt with known difficult airway including surgery cancellation at [facility] 2/2 inability to intubate despite multiple attempts. Monitors placed and pre-O2. SIVI. Two hand mask ventilation with OA required. Video laryngoscopy x 1 by CRNA with limited view. Second pass by myself, Deatra Canter, MD with view of epiglottis but unable to visualize vocal cords. Glidescope removed and ventilation resumed with OA again. Additional help called for and bronchoscope brought into room. Attempted bronchoscope by myself again with jaw thrust but unable to make turn and identify airway structures. Ventilation resumed with OA.  Dr. Fransisco Beau present and placed glidescope to help move tongue out of the way for bronchoscope intubation but during process vocal cords popped into view. ATOI using glidescope and rigid stylet by Dr. Fransisco Beau. Cuff inflated, +BBS, +ETCO2. Tube secured. Deatra Canter, MD
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
## 2022-09-11 NOTE — Anesthesia Postprocedure Evaluation (Signed)
Anesthesia Post Note  Patient: Cindy Velasquez  Procedure(s) Performed: CERVICAL FIVE-SIX ANTERIOR CERVICAL DECOMPRESSION/DISCECTOMY FUSION (Spine Cervical)     Patient location during evaluation: PACU Anesthesia Type: General Level of consciousness: awake and alert Pain management: pain level controlled Vital Signs Assessment: post-procedure vital signs reviewed and stable Respiratory status: spontaneous breathing, nonlabored ventilation, respiratory function stable and patient connected to nasal cannula oxygen Cardiovascular status: blood pressure returned to baseline and stable Postop Assessment: no apparent nausea or vomiting Anesthetic complications: yes   Encounter Notable Events  Notable Event Outcome Phase Comment  Difficult to intubate - expected  Intraprocedure Filed from anesthesia note documentation.    Last Vitals:  Vitals:   09/11/22 1415 09/11/22 1430  BP: (!) 169/72 (!) 158/58  Pulse: 71 77  Resp: 12 13  Temp:    SpO2: 93% 92%    Last Pain:  Vitals:   09/11/22 1445  TempSrc:   PainSc: 0-No pain                 Tiajuana Amass

## 2022-09-11 NOTE — Progress Notes (Signed)
Neurosurgery  Patient doing well in PACU, FC x 4 with full strength grossly, talking with mild hoarseness.  However, she will need overnight observation given her difficult airway.

## 2022-09-12 ENCOUNTER — Other Ambulatory Visit (HOSPITAL_BASED_OUTPATIENT_CLINIC_OR_DEPARTMENT_OTHER): Payer: Self-pay

## 2022-09-12 DIAGNOSIS — Z85828 Personal history of other malignant neoplasm of skin: Secondary | ICD-10-CM | POA: Diagnosis not present

## 2022-09-12 DIAGNOSIS — M50022 Cervical disc disorder at C5-C6 level with myelopathy: Secondary | ICD-10-CM | POA: Diagnosis not present

## 2022-09-12 DIAGNOSIS — Z87891 Personal history of nicotine dependence: Secondary | ICD-10-CM | POA: Diagnosis not present

## 2022-09-12 DIAGNOSIS — Z79899 Other long term (current) drug therapy: Secondary | ICD-10-CM | POA: Diagnosis not present

## 2022-09-12 DIAGNOSIS — I10 Essential (primary) hypertension: Secondary | ICD-10-CM | POA: Diagnosis not present

## 2022-09-12 DIAGNOSIS — M4802 Spinal stenosis, cervical region: Secondary | ICD-10-CM | POA: Diagnosis not present

## 2022-09-12 MED ORDER — CYCLOBENZAPRINE HCL 10 MG PO TABS
10.0000 mg | ORAL_TABLET | Freq: Three times a day (TID) | ORAL | 3 refills | Status: DC | PRN
  Filled 2022-09-12: qty 60, 20d supply, fill #0

## 2022-09-12 NOTE — Discharge Summary (Addendum)
Physician Discharge Summary  Patient ID: Cindy Velasquez MRN: [id number] DOB/AGE: May 13, 1941 81 y.o.  Admit date: 09/11/2022 Discharge date: 09/12/2022  Admission Diagnoses: Cervical stenosis with myelopathy  Discharge Diagnoses: Cervical stenosis with myelopathy Principal Problem:   Cervical vertebral fusion   Discharged Condition: good  Hospital Course: The patient was admitted on 09/11/2022 and taken to the operating room where the patient underwent C5-6 ACDF. The patient tolerated the procedure well and was taken to the recovery room and then to the floor in stable condition. The hospital course was routine. There were no complications. The wound remained clean dry and intact. Pt had appropriate neck soreness. No complaints of arm pain or new N/T/W. The patient remained afebrile with stable vital signs, and tolerated a regular diet. The patient continued to increase activities, and pain was well controlled with oral pain medications.   Consults: None  Significant Diagnostic Studies: radiology: X-Ray: intraoperative   Treatments: surgery: 1. Arthrodesis C5-6, anterior interbody technique, including Discectomy for decompression of spinal cord and exiting nerve roots with foraminotomies  2. Placement of intervertebral biomechanical device C5-6 3. Placement of anterior instrumentation consisting of interbody plate and screws Y1-8 4. Use of morselized bone allograft  5. Use of intraoperative microscope  Discharge Exam: Blood pressure (!) 142/58, pulse 97, temperature 98.2 F (36.8 C), temperature source Oral, resp. rate 18, height 5' (1.524 m), weight 73.5 kg, SpO2 95 %. Physical Exam: Patient is awake, A/O X 4, conversant, and in good spirits. Eyes open spontaneously. They are in NAD and VSS. Doing well. Speech is fluent and appropriate. MAEW with good strength that is symmetric bilaterally.  BUE 5/5 throughout, BLE 5/5 throughout. Sensation to light touch is intact. PERLA, EOMI. CNs  grossly intact. Dressing is clean dry intact. Incision is well approximated with no drainage, erythema, or edema. Soft cervical collar in place      Disposition: Discharge disposition: 01-Home or Self Care       Discharge Instructions     Incentive spirometry RT   Complete by: As directed       Allergies as of 09/12/2022       Reactions   Lactose Intolerance (gi)         Medication List     TAKE these medications    albuterol 108 (90 Base) MCG/ACT inhaler Commonly known as: VENTOLIN HFA Inhale 2 puffs into the lungs every 6 (six) hours as needed for wheezing or shortness of breath.   amoxicillin 875 MG tablet Commonly known as: AMOXIL Take 1 tablet (875 mg total) by mouth 2 (two) times daily.   azelastine 0.1 % nasal spray Commonly known as: ASTELIN Place 1 spray into both nostrils 2 (two) times daily. Use in each nostril as directed   clotrimazole-betamethasone cream Commonly known as: LOTRISONE Apply 1 application topically 2 (two) times daily. Use as needed for rash   Culturelle Digestive Daily Caps Take 1 capsule by mouth daily.   cyclobenzaprine 10 MG tablet Commonly known as: FLEXERIL Take 1 tablet (10 mg total) by mouth 3 (three) times daily as needed for muscle spasms.   fluticasone 50 MCG/ACT nasal spray Commonly known as: FLONASE Place 2 sprays into both nostrils daily as needed for allergies.   hydrochlorothiazide 25 MG tablet Commonly known as: HYDRODIURIL TAKE 1/2 TABLET(12.5 MG) BY MOUTH EVERY MORNING   ICaps Areds 2 Caps Take 1 capsule by mouth in the morning and at bedtime.   lisinopril 20 MG tablet Commonly known as: ZESTRIL TAKE  1 TABLET BY MOUTH DAILY   melatonin 3 MG Tabs tablet Take 3 mg by mouth at bedtime.   Olopatadine HCl 0.2 % Soln Commonly known as: Pataday 1 gtt each eye qd   rosuvastatin 20 MG tablet Commonly known as: CRESTOR TAKE 1 TABLET BY MOUTH EVERY DAY   SSD 1 % cream Generic drug: silver  sulfADIAZINE Apply 1 application on to the skin daily.   terbinafine 250 MG tablet Commonly known as: LamISIL Take 1 tablet (250 mg total) by mouth daily.   Vitamin D3 125 MCG (5000 UT) Caps Take 1 capsule (5,000 Units total) by mouth daily.        Follow-up Information     Vallarie Mare, MD Follow up.   Specialty: Neurosurgery Contact information: [address] [place] [address] [phone]                 Signed: Marvis Moeller 09/12/2022, 10:35 AM

## 2022-09-12 NOTE — Progress Notes (Signed)
PT Cancellation Note  Patient Details Name: Cindy Velasquez MRN: [id number] DOB: 16-Dec-1940   Cancelled Treatment:    Reason Eval/Treat Not Completed: PT screened, no needs identified, will sign off. Discussed pt case with OT who reports pt is currently mobilizing without assistance and does not require a formal PT evaluation at this time. PT signing off. If needs change, please reconsult.     Thelma Comp 09/12/2022, 10:13 AM  Rolinda Roan, PT, DPT Acute Rehabilitation Services Secure Chat Preferred Office: [phone]

## 2022-09-12 NOTE — Evaluation (Signed)
Occupational Therapy Evaluation and Discharge Patient Details Name: Cindy Velasquez MRN: [id number] DOB: August 08, 1941 Today's Date: 09/12/2022   History of Present Illness This 81 yo female s/p C5-6 fusion due to cervical myelopathy, severe stenosis with myelomalacia.   Clinical Impression   This 81 yo female admitted and underwent above presents to acute OT with PLOF of being totally independent with basic ADLs, IADLs, driving, [facility]. She currently is Mod I with all basic ADLs with having to really concentrate on not moving her neck too much due to she not having any pain. All education has been completed, we will D/C from acute OT. No acute PT needs identified.     Recommendations for follow up therapy are one component of a multi-disciplinary discharge planning process, led by the attending physician.  Recommendations may be updated based on patient status, additional functional criteria and insurance authorization.   Follow Up Recommendations  No OT follow up    Assistance Recommended at Discharge PRN  Patient can return home with the following Assistance with cooking/housework;Assist for transportation    Functional Status Assessment  Patient has had a recent decline in their functional status and demonstrates the ability to make significant improvements in function in a reasonable and predictable amount of time. (without further need for OT)  Equipment Recommendations  None recommended by OT       Precautions / Restrictions Precautions Precautions: Cervical Precaution Booklet Issued: Yes (comment) Required Braces or Orthoses: Cervical Brace Cervical Brace: Soft collar (off for showering) Restrictions Weight Bearing Restrictions: No      Mobility Bed Mobility Overal bed mobility: Modified Independent                  Transfers Overall transfer level: Independent Equipment used: None                      Balance Overall balance  assessment: Independent                                         ADL either performed or assessed with clinical judgement   ADL                                         General ADL Comments: Educated pt and dtr on using 2 cups for brushing teeth (one to spit and one rinse with straw)--pt returned demonstrated; donning upper and lower body clothing--pt returned demonstrated; getting out of a flat bed--pt returned demonstrated; how to protect collar when eating and brushing teeth--pt returned demonstrated; recommended a hand held shower.     Vision Baseline Vision/History: 1 Wears glasses Ability to See in Adequate Light: 0 Adequate Patient Visual Report: No change from baseline              Pertinent Vitals/Pain Pain Assessment Pain Assessment: No/denies pain     Hand Dominance Right   Extremity/Trunk Assessment Upper Extremity Assessment Upper Extremity Assessment: RUE deficits/detail;LUE deficits/detail RUE Deficits / Details: still with numbness in hands LUE Deficits / Details: still with numbness in hands           Communication Communication Communication: No difficulties   Cognition Arousal/Alertness: Awake/alert Behavior During Therapy: WFL for tasks assessed/performed Overall Cognitive Status: Within Functional Limits for  tasks assessed                                                  Home Living Family/patient expects to be discharged to:: Private residence Living Arrangements: Spouse/significant other Available Help at Discharge: Family;Available 24 hours/day Type of Home: [place]: One level     Bathroom Shower/Tub: Occupational psychologist: Standard     Home Equipment: Tub bench   Additional Comments[facility]      Prior Functioning/Environment Prior Level of Function : Independent/Modified Independent;Driving                        OT  Problem List: Impaired sensation         OT Goals(Current goals can be found in the care plan section) Acute Rehab OT Goals Patient Stated Goal: to go home today         AM-PAC OT "6 Clicks" Daily Activity     Outcome Measure Help from another person eating meals?: None Help from another person taking care of personal grooming?: None Help from another person toileting, which includes using toliet, bedpan, or urinal?: None Help from another person bathing (including washing, rinsing, drying)?: None Help from another person to put on and taking off regular upper body clothing?: None Help from another person to put on and taking off regular lower body clothing?: None 6 Click Score: 24   End of Session Nurse Communication:  (no OT needs nor PT needs)  Activity Tolerance: Patient tolerated treatment well Patient left: in chair;with family/visitor present                   Time: 8850-2774 OT Time Calculation (min): 49 min Charges:  OT General Charges $OT Visit: 1 Visit OT Evaluation $OT Eval Moderate Complexity: 1 Mod OT Treatments $Self Care/Home Management : 23-37 mins  Golden Circle, OTR/L Acute Rehab Services [facility] [phone] Office [phone]    Almon Register 09/12/2022, 10:30 AM

## 2022-09-12 NOTE — Progress Notes (Signed)
Patient alert and oriented, mae's well, voiding adequate amount of urine, swallowing without difficulty, no c/o pain at time of discharge. Patient discharged home with family. Script and discharged instructions given to patient. Patient and family stated understanding of instructions given. Patient has an appointment with Dr. Thomas ?

## 2022-09-12 NOTE — Discharge Instructions (Signed)
Wound Care Remove dressing in 3 days Leave incision open to air. You may shower. Do not scrub directly on incision.  Do not put any creams, lotions, or ointments on incision. Activity Walk each and every day, increasing distance each day. No lifting greater than 5 lbs.  Avoid excessive neck motion. No driving for 2 weeks; may ride as a passenger locally. Wear neck brace at all times except when showering.  If provided soft collar, may wear for comfort unless otherwise instructed. Diet Resume your normal diet.   Call Your Doctor If Any of These Occur Redness, drainage, or swelling at the wound.  Temperature greater than 101 degrees. Severe pain not relieved by pain medication. Increased difficulty swallowing. Incision starts to come apart. Follow Up Appt Call [phone]) for problems.  If you have any hardware placed in your spine, you will need an x-ray before your appointment.

## 2022-09-13 ENCOUNTER — Encounter (HOSPITAL_COMMUNITY): Payer: Self-pay | Admitting: Neurosurgery

## 2022-09-15 ENCOUNTER — Ambulatory Visit (INDEPENDENT_AMBULATORY_CARE_PROVIDER_SITE_OTHER): Payer: Medicare HMO | Admitting: Medical

## 2022-09-15 ENCOUNTER — Encounter: Payer: Self-pay | Admitting: *Deleted

## 2022-09-15 ENCOUNTER — Telehealth: Payer: Self-pay | Admitting: Family Medicine

## 2022-09-15 ENCOUNTER — Encounter: Payer: Self-pay | Admitting: Medical

## 2022-09-15 VITALS — BP 150/60 | HR 100 | Temp 97.9°F | Resp 18 | Ht 60.0 in | Wt 161.0 lb

## 2022-09-15 DIAGNOSIS — R062 Wheezing: Secondary | ICD-10-CM | POA: Diagnosis not present

## 2022-09-15 DIAGNOSIS — M542 Cervicalgia: Secondary | ICD-10-CM

## 2022-09-15 NOTE — Telephone Encounter (Signed)
Called Cindy Velasquez back- had to [facility] as no answer  It looks like she had cervical fusion performed on 10/5.  Since she is having throat symptoms I recommended that they call her surgeon for advice today.  However, if any true difficulty swallowing, not able to eat or drink please take her to the emergency room  Please let us know if we can do anything else to help

## 2022-09-15 NOTE — Progress Notes (Signed)
Subjective:    Patient ID: Cindy Velasquez, female    DOB: Sep 05, 1941, 81 y.o.   MRN: [id number]  HPI  Pt in for follow up.  Pt had surgery.   Pt called her surgeon office as had concern for complication.   Pt feel like has mucus stuck in her throat.  She feels like she is wheezing all the sudden.   No history of asthma.  Dr. Duffy Rhody [phone].  Surgery was done last Thursday.  Described slight rough breath sounds.  Feels difficult to swallow.   Pt describes very large tongue and small airway. Difficult intubation.   Review of Systems  Constitutional:  Negative for chills and fatigue.  Respiratory:  Negative for cough, chest tightness, shortness of breath and wheezing.   Cardiovascular:  Negative for chest pain and palpitations.  Gastrointestinal:  Negative for abdominal pain.  Genitourinary:  Negative for dysuria, flank pain, frequency and urgency.  Musculoskeletal:  Positive for neck pain. Negative for back pain and joint swelling.    Past Medical History:  Diagnosis Date   Adenomatous colon polyp    Cancer ([place]) 03/2015   BCC R tibia    Difficult intubation    Diverticulosis of colon    GERD (gastroesophageal reflux disease)    Hip pain    Hyperlipidemia    Hyperplastic colon polyp    Hypertension    Lactose intolerance    Loose stools    Lower back pain    Osteoporosis    Palpitations      Social History   Socioeconomic History   Marital status: Married    Spouse name: Seri Kimmer   Number of children: 4   Years of education: Not on file   Highest education level: Not on file  Occupational History   Occupation: retired--[employer]  Tobacco Use   Smoking status: Former    Packs/day: 0.50    Years: 30.00    Total pack years: 15.00    Types: Cigarettes    Quit date: 06/02/1991    Years since quitting: 31.3   Smokeless tobacco: Never  Vaping Use   Vaping Use: Never used  Substance and Sexual Activity    Alcohol use: Yes    Alcohol/week: 2.0 standard drinks of alcohol    Types: 2 Glasses of wine per week   Drug use: No   Sexual activity: Not Currently    Partners: Male  Other Topics Concern   Not on file  Social History Narrative   Exercise--  [employer]--yoga and cardio 3x a week      Right Handed    Lives in a one story home    Social Determinants of Health   Financial Resource Strain: Low Risk  (01/28/2022)   Overall Financial Resource Strain (CARDIA)    Difficulty of Paying Living Expenses: Not hard at all  Food Insecurity: No Food Insecurity (01/28/2022)   Hunger Vital Sign    Worried About Running Out of Food in the Last Year: Never true    [place] in the Last Year: Never true  Transportation Needs: No Transportation Needs (01/28/2022)   PRAPARE - Hydrologist (Medical): No    Lack of Transportation (Non-Medical): No  Physical Activity: Sufficiently Active (01/28/2022)   Exercise Vital Sign    Days of Exercise per Week: 3 days    Minutes of Exercise per Session: 60 min  Stress: No Stress Concern Present (01/28/2022)  [employer] - Occupational Stress Questionnaire    Feeling of Stress : Not at all  Social Connections: Moderately Integrated (01/28/2022)   Social Connection and Isolation Panel [NHANES]    Frequency of Communication with Friends and Family: More than three times a week    Frequency of Social Gatherings with Friends and Family: More than three times a week    Attends Religious Services: Never    Marine scientist or Organizations: Yes    Attends Archivist Meetings: More than 4 times per year    Marital Status: Married  Human resources officer Violence: Not At Risk (01/28/2022)   Humiliation, Afraid, Rape, and Kick questionnaire    Fear of Current or Ex-Partner: No    Emotionally Abused: No    Physically Abused: No    Sexually Abused: No    Past Surgical History:  Procedure Laterality  Date   ANTERIOR CERVICAL DECOMP/DISCECTOMY FUSION N/A 09/11/2022   Procedure: CERVICAL FIVE-SIX ANTERIOR CERVICAL DECOMPRESSION/DISCECTOMY FUSION;  Surgeon: Vallarie Mare, MD;  Location: [facility];  Service: Neurosurgery;  Laterality: N/A;   EYE SURGERY  2012   B/L 01/2011  02/2011   HAMMER TOE SURGERY     Bilateral   ROTATOR CUFF REPAIR     Left   TUBAL LIGATION      Family History  Problem Relation Age of Onset   Hypertension Mother    Cancer Mother 39       ?   Hyperlipidemia Mother    Obesity Mother    Hypertension Father    Stroke Father    Heart disease Father    Hypertension Brother    Testicular cancer Brother 58   Hypertension Brother    Heart disease Brother        quadruple bypass   Colon cancer Neg Hx    Stomach cancer Neg Hx    Throat cancer Neg Hx     Allergies  Allergen Reactions   Lactose Intolerance (Gi)     Current Outpatient Medications on File Prior to Visit  Medication Sig Dispense Refill   albuterol (VENTOLIN HFA) 108 (90 Base) MCG/ACT inhaler Inhale 2 puffs into the lungs every 6 (six) hours as needed for wheezing or shortness of breath. (Patient not taking: Reported on 09/02/2022) 6.7 g 2   Cholecalciferol (VITAMIN D3) 125 MCG (5000 UT) CAPS Take 1 capsule (5,000 Units total) by mouth daily. 30 capsule 0   clotrimazole-betamethasone (LOTRISONE) cream Apply 1 application topically 2 (two) times daily. Use as needed for rash 30 g 1   cyclobenzaprine (FLEXERIL) 10 MG tablet Take 1 tablet (10 mg total) by mouth 3 (three) times daily as needed for muscle spasms. 60 tablet 3   fluticasone (FLONASE) 50 MCG/ACT nasal spray Place 2 sprays into both nostrils daily as needed for allergies.     hydrochlorothiazide (HYDRODIURIL) 25 MG tablet TAKE 1/2 TABLET(12.5 MG) BY MOUTH EVERY MORNING 45 tablet 1   Lactobacillus-Inulin (CULTURELLE DIGESTIVE DAILY) CAPS Take 1 capsule by mouth daily.     lisinopril (ZESTRIL) 20 MG tablet TAKE 1 TABLET BY MOUTH DAILY 90 tablet  1   melatonin 3 MG TABS tablet Take 3 mg by mouth at bedtime.     Multiple Vitamins-Minerals (ICAPS AREDS 2) CAPS Take 1 capsule by mouth in the morning and at bedtime.     Olopatadine HCl (PATADAY) 0.2 % SOLN 1 gtt each eye qd  0   rosuvastatin (CRESTOR) 20 MG tablet TAKE  1 TABLET BY MOUTH EVERY DAY 90 tablet 1   silver sulfADIAZINE (SILVADENE) 1 % cream Apply 1 application on to the skin daily. (Patient not taking: Reported on 09/02/2022) 50 g 0   terbinafine (LAMISIL) 250 MG tablet Take 1 tablet (250 mg total) by mouth daily. (Patient not taking: Reported on 09/02/2022) 120 tablet 1   No current facility-administered medications on file prior to visit.    BP (!) 150/60   Pulse 100   Temp 97.9 F (36.6 C)   Resp 18   Ht 5' (1.524 m)   Wt 161 lb (73 kg)   SpO2 97%   BMI 31.44 kg/m        Objective:   Physical Exam  .General Mental Status- Alert. General Appearance- Not in acute distress.   Skin General: Color- Normal Color. Moisture- Normal Moisture.  Neck Anterior neck- over incision scar looks clear. No swelling. Steri strips are in place. Chest and Lung Exam Auscultation: Breath Sounds:-Normal.  Cardiovascular Auscultation:Rythm- Regular. Murmurs & Other Heart Sounds:Auscultation of the heart reveals- No Murmurs.  Abdomen Inspection:-Inspeection Normal. Palpation/Percussion:Note:No mass. Palpation and Percussion of the abdomen reveal- Non Tender, Non Distended + BS, no rebound or guarding.   Neurologic Cranial Nerve exam:- CN III-XII intact(No nystagmus), symmetric smile. Strength:- 5/5 equal and symmetric strength both upper and lower extremities.      Assessment & Plan:   Patient Instructions  Neck discomfort and painful swallowing. Mild faint upper airway rough breath sounds. O2 sat was 97%.   Did call specialist office and got you follow up on  Wed at 10:15 am Address (number is same as main office) [phone]   If you were to have  worse symptoms before appointment then be seen in the ED.  Cbc tomorrow at 10:30 am at our office.  Follow up date to be determined after     Mackie Pai, PA-C   Time spent with patient today was  41 minutes which consisted of chart revdiew, discussing diagnosis, work up, treatment,  and Occupational psychologist office to arrange surgeon evaluation.

## 2022-09-15 NOTE — Telephone Encounter (Signed)
Lelon Frohlich (daughter DPR OK) called stating that pt is having some difficulty swallowing and it sounds like something is stuck in there (phlegm, mucus, etc.). Pt had neck surgery not long ago and other than this symptom, she is doing well with recovery. Please Advise.

## 2022-09-15 NOTE — Patient Instructions (Addendum)
Neck discomfort and painful swallowing. Mild faint upper airway rough breath sounds. O2 sat was 97%.   Did call specialist office and got you follow up on  Wed at 10:15 am Address (number is same as main office) [phone]   If you were to have worse symptoms before appointment then be seen in the ED.  Cbc tomorrow at 10:30 am at our office.    Follow up date to be determined after

## 2022-09-16 ENCOUNTER — Other Ambulatory Visit (INDEPENDENT_AMBULATORY_CARE_PROVIDER_SITE_OTHER): Payer: Medicare HMO

## 2022-09-16 DIAGNOSIS — M542 Cervicalgia: Secondary | ICD-10-CM | POA: Diagnosis not present

## 2022-09-16 LAB — CBC WITH DIFFERENTIAL/PLATELET
Basophils Absolute: 0 10*3/uL (ref 0.0–0.1)
Basophils Relative: 0.6 % (ref 0.0–3.0)
Eosinophils Absolute: 0.5 10*3/uL (ref 0.0–0.7)
Eosinophils Relative: 7 % — ABNORMAL HIGH (ref 0.0–5.0)
HCT: 42.7 % (ref 36.0–46.0)
Hemoglobin: 13.9 g/dL (ref 12.0–15.0)
Lymphocytes Relative: 25.3 % (ref 12.0–46.0)
Lymphs Abs: 1.7 10*3/uL (ref 0.7–4.0)
MCHC: 32.5 g/dL (ref 30.0–36.0)
MCV: 90 fl (ref 78.0–100.0)
Monocytes Absolute: 0.6 10*3/uL (ref 0.1–1.0)
Monocytes Relative: 9.3 % (ref 3.0–12.0)
Neutro Abs: 3.9 10*3/uL (ref 1.4–7.7)
Neutrophils Relative %: 57.8 % (ref 43.0–77.0)
Platelets: 268 10*3/uL (ref 150.0–400.0)
RBC: 4.75 Mil/uL (ref 3.87–5.11)
RDW: 15.2 % (ref 11.5–15.5)
WBC: 6.7 10*3/uL (ref 4.0–10.5)

## 2022-09-16 NOTE — Addendum Note (Signed)
Addended by: Manuela Schwartz on: 09/16/2022 10:32 AM   Modules accepted: Orders

## 2022-09-16 NOTE — Addendum Note (Signed)
Addended by: Anabel Halon on: 09/16/2022 10:29 AM   Modules accepted: Orders

## 2022-09-17 ENCOUNTER — Other Ambulatory Visit (HOSPITAL_BASED_OUTPATIENT_CLINIC_OR_DEPARTMENT_OTHER): Payer: Self-pay

## 2022-09-17 ENCOUNTER — Other Ambulatory Visit (HOSPITAL_COMMUNITY): Payer: Self-pay

## 2022-09-17 MED ORDER — DEXAMETHASONE 2 MG PO TABS
ORAL_TABLET | ORAL | 0 refills | Status: DC
  Filled 2022-09-17: qty 37, 14d supply, fill #0

## 2022-09-18 ENCOUNTER — Other Ambulatory Visit: Payer: Self-pay | Admitting: Family Medicine

## 2022-09-18 DIAGNOSIS — I10 Essential (primary) hypertension: Secondary | ICD-10-CM

## 2022-10-12 ENCOUNTER — Encounter (HOSPITAL_BASED_OUTPATIENT_CLINIC_OR_DEPARTMENT_OTHER): Payer: Self-pay | Admitting: Emergency Medicine

## 2022-10-12 ENCOUNTER — Emergency Department (HOSPITAL_BASED_OUTPATIENT_CLINIC_OR_DEPARTMENT_OTHER)
Admission: EM | Admit: 2022-10-12 | Discharge: 2022-10-12 | Disposition: A | Discharge status: Home / Self Care | Payer: Medicare HMO | Attending: Emergency Medicine | Admitting: Emergency Medicine

## 2022-10-12 ENCOUNTER — Other Ambulatory Visit: Payer: Self-pay

## 2022-10-12 ENCOUNTER — Emergency Department (HOSPITAL_BASED_OUTPATIENT_CLINIC_OR_DEPARTMENT_OTHER): Payer: Medicare HMO

## 2022-10-12 DIAGNOSIS — Z79899 Other long term (current) drug therapy: Secondary | ICD-10-CM | POA: Insufficient documentation

## 2022-10-12 DIAGNOSIS — W19XXXA Unspecified fall, initial encounter: Secondary | ICD-10-CM | POA: Insufficient documentation

## 2022-10-12 DIAGNOSIS — R079 Chest pain, unspecified: Secondary | ICD-10-CM | POA: Diagnosis not present

## 2022-10-12 DIAGNOSIS — S39012A Strain of muscle, fascia and tendon of lower back, initial encounter: Secondary | ICD-10-CM | POA: Diagnosis not present

## 2022-10-12 DIAGNOSIS — Z85828 Personal history of other malignant neoplasm of skin: Secondary | ICD-10-CM | POA: Insufficient documentation

## 2022-10-12 DIAGNOSIS — M546 Pain in thoracic spine: Secondary | ICD-10-CM | POA: Diagnosis not present

## 2022-10-12 DIAGNOSIS — S20212A Contusion of left front wall of thorax, initial encounter: Secondary | ICD-10-CM | POA: Diagnosis not present

## 2022-10-12 DIAGNOSIS — M545 Low back pain, unspecified: Secondary | ICD-10-CM | POA: Diagnosis not present

## 2022-10-12 DIAGNOSIS — I10 Essential (primary) hypertension: Secondary | ICD-10-CM | POA: Insufficient documentation

## 2022-10-12 DIAGNOSIS — Y9301 Activity, walking, marching and hiking: Secondary | ICD-10-CM | POA: Insufficient documentation

## 2022-10-12 DIAGNOSIS — S3992XA Unspecified injury of lower back, initial encounter: Secondary | ICD-10-CM | POA: Diagnosis present

## 2022-10-12 DIAGNOSIS — M4316 Spondylolisthesis, lumbar region: Secondary | ICD-10-CM | POA: Diagnosis not present

## 2022-10-12 NOTE — ED Provider Notes (Signed)
[facility] EMERGENCY DEPARTMENT Provider Note   CSN: [id number] Arrival date & time: 10/12/22  1334     History  Chief Complaint  Patient presents with   Cindy Velasquez is a 81 y.o. female.  Pt is a 81 yo female with pmhx significant for htn, hld, gerd, skin cancer, and seasonal allergies.  Pt said she was walking up some steps on Thursday, 11/2 around 6 pm.  Her friend behind her said something.  She turned to see what the friend wanted and missed the next step.  She did fall on her left side.  No loc.  No blood thinners.  She initially thought she was getting better, but it is still hurting.  She has been able to walk.  Pt is very busy and said [facility] 65 and wanted to make sure she did not have anything broken.  Pt does not want anything for pain.       Home Medications Prior to Admission medications   Medication Sig Start Date End Date Taking? Authorizing Provider  albuterol (VENTOLIN HFA) 108 (90 Base) MCG/ACT inhaler Inhale 2 puffs into the lungs every 6 (six) hours as needed for wheezing or shortness of breath. Patient not taking: Reported on 09/02/2022 08/29/22   Colon Branch, MD  Cholecalciferol (VITAMIN D3) 125 MCG (5000 UT) CAPS Take 1 capsule (5,000 Units total) by mouth daily. 04/05/19   Abby Potash, PA-C  clotrimazole-betamethasone (LOTRISONE) cream Apply 1 application topically 2 (two) times daily. Use as needed for rash 12/11/20   Carollee Herter, Alferd Apa, DO  cyclobenzaprine (FLEXERIL) 10 MG tablet Take 1 tablet (10 mg total) by mouth 3 (three) times daily as needed for muscle spasms. 09/12/22   Marvis Moeller, NP  dexamethasone (DECADRON) 2 MG tablet Take 2 tablets by mouth every 8 hours for 3 days, then 1 tablet every 8 hours for 3 days, then 1 tablet every 12 hours for 3 days, then half a tablet every 12 hours for 3 days, and then half a tablet daily for 2 days. 09/17/22     fluticasone (FLONASE)  50 MCG/ACT nasal spray Place 2 sprays into both nostrils daily as needed for allergies.    [provider]  hydrochlorothiazide (HYDRODIURIL) 25 MG tablet TAKE 1/2 TABLET(12.5 MG) BY MOUTH EVERY MORNING 05/08/22   Carollee Herter, Alferd Apa, DO  Lactobacillus-Inulin (CULTURELLE DIGESTIVE DAILY) CAPS Take 1 capsule by mouth daily.    [provider]  lisinopril (ZESTRIL) 20 MG tablet TAKE 1 TABLET BY MOUTH DAILY 09/18/22   Carollee Herter, Yvonne R, DO  melatonin 3 MG TABS tablet Take 3 mg by mouth at bedtime.    [provider]  Multiple Vitamins-Minerals (ICAPS AREDS 2) CAPS Take 1 capsule by mouth in the morning and at bedtime.    [provider]  Olopatadine HCl (PATADAY) 0.2 % SOLN 1 gtt each eye qd 05/23/22   Carollee Herter, Kendrick Fries R, DO  rosuvastatin (CRESTOR) 20 MG tablet TAKE 1 TABLET BY MOUTH EVERY DAY 04/28/22   Carollee Herter, Alferd Apa, DO  silver sulfADIAZINE (SILVADENE) 1 % cream Apply 1 application on to the skin daily. Patient not taking: Reported on 09/02/2022 10/10/21   Carollee Herter, Alferd Apa, DO  terbinafine (LAMISIL) 250 MG tablet Take 1 tablet (250 mg total) by mouth daily. Patient not taking: Reported on 09/02/2022 12/30/21   Ann Held, DO  Allergies    Lactose intolerance (gi)    Review of Systems   Review of Systems  Musculoskeletal:  Positive for back pain.       Lower left posterior rib pain  All other systems reviewed and are negative.   Physical Exam Updated Vital Signs BP (!) 145/119   Pulse (!) 107   Temp 98.2 F (36.8 C) (Oral)   Resp 18   Ht 5' (1.524 m)   Wt 72.6 kg   SpO2 97%   BMI 31.25 kg/m  Physical Exam Vitals and nursing note reviewed.  Constitutional:      Appearance: Normal appearance.  HENT:     Head: Normocephalic and atraumatic.     Right Ear: External ear normal.     Left Ear: External ear normal.     Nose: Nose normal.     Mouth/Throat:     Mouth: Mucous membranes are moist.     Pharynx: Oropharynx  is clear.  Eyes:     Extraocular Movements: Extraocular movements intact.     Conjunctiva/sclera: Conjunctivae normal.     Pupils: Pupils are equal, round, and reactive to light.  Cardiovascular:     Rate and Rhythm: Normal rate and regular rhythm.     Pulses: Normal pulses.     Heart sounds: Normal heart sounds.  Pulmonary:     Effort: Pulmonary effort is normal.     Breath sounds: Normal breath sounds.  Abdominal:     General: Abdomen is flat. Bowel sounds are normal.     Palpations: Abdomen is soft.  Musculoskeletal:       Arms:     Cervical back: Normal range of motion and neck supple.  Skin:    General: Skin is warm.     Capillary Refill: Capillary refill takes less than 2 seconds.     Comments: Skin tear left lower leg.  Not infected.  Neurological:     General: No focal deficit present.     Mental Status: She is alert and oriented to person, place, and time.  Psychiatric:        Mood and Affect: Mood normal.        Behavior: Behavior normal.     ED Results / Procedures / Treatments   Labs (all labs ordered are listed, but only abnormal results are displayed) Labs Reviewed - No data to display  EKG None  Radiology DG Thoracic Spine 2 View  Result Date: 10/12/2022 CLINICAL DATA:  Fall several days ago.  Thoracic back pain. EXAM: THORACIC SPINE 2 VIEWS COMPARISON:  None Available. FINDINGS: There is no evidence of thoracic spine fracture. Alignment is normal. Mild degenerative disc disease is seen at multiple thoracic levels. No focal lytic or sclerotic bone lesions identified. IMPRESSION: No acute findings. Mild degenerative spondylosis. Electronically Signed   By: Marlaine Hind M.D.   On: 10/12/2022 14:37   DG Lumbar Spine Complete  Result Date: 10/12/2022 CLINICAL DATA:  Fall several days ago.  Low back pain. EXAM: LUMBAR SPINE - COMPLETE 4+ VIEW COMPARISON:  None Available. FINDINGS: There is no evidence of lumbar spine fracture. Mild degenerative disc disease is  seen at L2-3 and L3-4. Bilateral facet osteoarthritis is seen at L4-5 and L5-S1. Grade 1 anterolisthesis is seen at L4-5 measuring 6 mm, attributable to the degenerative changes seen at this level. No focal lytic or sclerotic bone lesions identified. Aortic atherosclerotic calcification incidentally noted. IMPRESSION: No acute findings. Degenerative spondylosis, with grade 1 anterolisthesis at L4-5. Electronically  Signed   By: Marlaine Hind M.D.   On: 10/12/2022 14:36   DG Ribs Unilateral W/Chest Left  Result Date: 10/12/2022 CLINICAL DATA:  Fall.  Left-sided chest pain. EXAM: LEFT RIBS AND CHEST - 3+ VIEW COMPARISON:  07/08/2021 FINDINGS: No fracture or other bone lesions are seen involving the ribs. There is no evidence of pneumothorax or pleural effusion. Both lungs are clear. Heart size and mediastinal contours are within normal limits. IMPRESSION: Negative. Electronically Signed   By: Marlaine Hind M.D.   On: 10/12/2022 14:34    Procedures Procedures    Medications Ordered in ED Medications - No data to display  ED Course/ Medical Decision Making/ A&P                           Medical Decision Making Amount and/or Complexity of Data Reviewed Radiology: ordered.   This patient presents to the ED for concern of fall, this involves an extensive number of treatment options, and is a complaint that carries with it a high risk of complications and morbidity.  The differential diagnosis includes fx, sprain, contusion   Co morbidities that complicate the patient evaluation  htn, hld, gerd, skin cancer, and seasonal allergies   Additional history obtained:  Additional history obtained from epic chart review External records from outside source obtained and reviewed including husband  Imaging Studies ordered:  I ordered imaging studies including lumbar, thoracic, and rib xrays  I independently visualized and interpreted imaging which showed xrays neg for acute injury I agree with the  radiologist interpretation   Medicines ordered and prescription drug management:  I have reviewed the patients home medicines and have made adjustments as needed  Problem List / ED Course:  Fall:  pt has no fx.  She is stable for d/c.  She is to return if worse.  F/u with pcp.   Reevaluation:  After the interventions noted above, I reevaluated the patient and found that they have :improved   Social Determinants of Health:  Lives at home   Dispostion:  After consideration of the diagnostic results and the patients response to treatment, I feel that the patent would benefit from discharge with outpatient f/u.          Final Clinical Impression(s) / ED Diagnoses Final diagnoses:  Fall, initial encounter  Contusion of rib on left side, initial encounter  Strain of lumbar region, initial encounter    Rx / DC Orders ED Discharge Orders     None         Isla Pence, MD 10/12/22 1449

## 2022-10-12 NOTE — Discharge Instructions (Addendum)
Take otc tylenol or salonpas patches for pain.

## 2022-10-12 NOTE — ED Triage Notes (Signed)
Pt arrives pov, steady gait, endorses mechanical fall x 4 days pta. Pt c/o lower back pain. Denies thinners

## 2022-10-25 ENCOUNTER — Other Ambulatory Visit: Payer: Self-pay | Admitting: Family Medicine

## 2022-11-08 ENCOUNTER — Other Ambulatory Visit: Payer: Self-pay | Admitting: Family Medicine

## 2022-11-12 DIAGNOSIS — M4712 Other spondylosis with myelopathy, cervical region: Secondary | ICD-10-CM | POA: Diagnosis not present

## 2022-11-12 DIAGNOSIS — Z981 Arthrodesis status: Secondary | ICD-10-CM | POA: Diagnosis not present

## 2022-11-12 DIAGNOSIS — M542 Cervicalgia: Secondary | ICD-10-CM | POA: Diagnosis not present

## 2022-11-12 DIAGNOSIS — M47812 Spondylosis without myelopathy or radiculopathy, cervical region: Secondary | ICD-10-CM | POA: Diagnosis not present

## 2022-11-24 ENCOUNTER — Encounter: Payer: Self-pay | Admitting: Family Medicine

## 2022-11-24 ENCOUNTER — Ambulatory Visit (INDEPENDENT_AMBULATORY_CARE_PROVIDER_SITE_OTHER): Payer: Medicare HMO | Admitting: Family Medicine

## 2022-11-24 VITALS — BP 126/78 | HR 83 | Temp 97.8°F | Resp 18 | Ht 60.0 in | Wt 161.2 lb

## 2022-11-24 DIAGNOSIS — I10 Essential (primary) hypertension: Secondary | ICD-10-CM

## 2022-11-24 DIAGNOSIS — E785 Hyperlipidemia, unspecified: Secondary | ICD-10-CM | POA: Diagnosis not present

## 2022-11-24 LAB — LIPID PANEL
Cholesterol: 193 mg/dL (ref 0–200)
HDL: 46.9 mg/dL (ref >39.00)
LDL Cholesterol: 108 mg/dL — ABNORMAL HIGH (ref 0–99)
NonHDL: 146.18
Total CHOL/HDL Ratio: 4
Triglycerides: 190 mg/dL — ABNORMAL HIGH (ref 0.0–149.0)
VLDL: 38 mg/dL (ref 0.0–40.0)

## 2022-11-24 LAB — COMPREHENSIVE METABOLIC PANEL
ALT: 14 U/L (ref 0–35)
AST: 17 U/L (ref 0–37)
Albumin: 4.1 g/dL (ref 3.5–5.2)
Alkaline Phosphatase: 77 U/L (ref 39–117)
BUN: 20 mg/dL (ref 6–23)
CO2: 30 mEq/L (ref 19–32)
Calcium: 9.9 mg/dL (ref 8.4–10.5)
Chloride: 105 mEq/L (ref 96–112)
Creatinine, Ser: 0.61 mg/dL (ref 0.40–1.20)
GFR: 83.9 mL/min (ref >60.00)
Glucose, Bld: 76 mg/dL (ref 70–99)
Potassium: 4.9 mEq/L (ref 3.5–5.1)
Sodium: 145 mEq/L (ref 135–145)
Total Bilirubin: 0.5 mg/dL (ref 0.2–1.2)
Total Protein: 6.7 g/dL (ref 6.0–8.3)

## 2022-11-24 NOTE — Patient Instructions (Signed)

## 2022-11-24 NOTE — Progress Notes (Signed)
Subjective:   By signing my name below, I, Cindy Velasquez, attest that this documentation has been prepared under the direction and in the presence of Cindy Velasquez 11/24/2022    Patient ID: Cindy Velasquez, female    DOB: 10-Mar-1941, 81 y.o.   MRN: [id number]  Chief Complaint  Patient presents with   Hypertension   Follow-up    HPI Patient is in today for an office visit  She does not need any refills at this time.  She presents with scratches on her left forearm. She had encounters with her dog on 11/23/2022.   She reports of discomfort on the right side of her neck. She also has associated symptoms of lightheadedness.   As of today's visit, her blood pressure is normal. BP Readings from Last 3 Encounters:  11/24/22 126/78  10/12/22 (!) 157/73  09/15/22 (!) 150/60   Pulse Readings from Last 3 Encounters:  11/24/22 83  10/12/22 83  09/15/22 100   Her last completed mammogram was 09/10/2021 and is due on 09/10/2022. She states that she is going to make an appointment the first part of next year.   She underwent a Cervical 5-6 discectomy fusion on 09/11/2022. She denies of any significant concerns.   She reports that she has received the Shingles vaccine. She went to [facility] for the vaccine and is not able to get the records from the location.   Past Medical History:  Diagnosis Date   Adenomatous colon polyp    Cancer ([place]) 03/2015   [place] tibia    Difficult intubation    Diverticulosis of colon    GERD (gastroesophageal reflux disease)    Hip pain    Hyperlipidemia    Hyperplastic colon polyp    Hypertension    Lactose intolerance    Loose stools    Lower back pain    Osteoporosis    Palpitations     Past Surgical History:  Procedure Laterality Date   ANTERIOR CERVICAL DECOMP/DISCECTOMY FUSION N/A 09/11/2022   Procedure: CERVICAL FIVE-SIX ANTERIOR CERVICAL DECOMPRESSION/DISCECTOMY FUSION;  Surgeon: Vallarie Mare, MD;  Location: [place];   Service: Neurosurgery;  Laterality: N/A;   EYE SURGERY  2012   B/L 01/2011  02/2011   HAMMER TOE SURGERY     Bilateral   ROTATOR CUFF REPAIR     Left   TUBAL LIGATION      Family History  Problem Relation Age of Onset   Hypertension Mother    Cancer Mother 30       ?   Hyperlipidemia Mother    Obesity Mother    Hypertension Father    Stroke Father    Heart disease Father    Hypertension Brother    Testicular cancer Brother 72   Hypertension Brother    Heart disease Brother        quadruple bypass   Colon cancer Neg Hx    Stomach cancer Neg Hx    Throat cancer Neg Hx     Social History   Socioeconomic History   Marital status: Married    Spouse name: Cindy Velasquez   Number of children: 4   Years of education: Not on file   Highest education level: Not on file  Occupational History   Occupation: retired--[employer]  Tobacco Use   Smoking status: Former    Packs/day: 0.50    Years: 30.00    Total pack years: 15.00    Types: Cigarettes  Quit date: 06/02/1991    Years since quitting: 31.5   Smokeless tobacco: Never  Vaping Use   Vaping Use: Never used  Substance and Sexual Activity   Alcohol use: Yes    Alcohol/week: 2.0 standard drinks of alcohol    Types: 2 Glasses of wine per week   Drug use: No   Sexual activity: Not Currently    Partners: Male  Other Topics Concern   Not on file  Social History Narrative   Exercise--  [employer]--yoga and cardio 3x a week      Right Handed    Lives in a one story home    Social Determinants of Health   Financial Resource Strain: Low Risk  (01/28/2022)   Overall Financial Resource Strain (CARDIA)    Difficulty of Paying Living Expenses: Not hard at all  Food Insecurity: No Food Insecurity (01/28/2022)   Hunger Vital Sign    Worried About Running Out of Food in the Last Year: Never true    [place] in the Last Year: Never true  Transportation Needs: No Transportation Needs (01/28/2022)    PRAPARE - Hydrologist (Medical): No    Lack of Transportation (Non-Medical): No  Physical Activity: Sufficiently Active (01/28/2022)   Exercise Vital Sign    Days of Exercise per Week: 3 days    Minutes of Exercise per Session: 60 min  Stress: No Stress Concern Present (01/28/2022)   [place]    Feeling of Stress : Not at all  Social Connections: Moderately Integrated (01/28/2022)   Social Connection and Isolation Panel [NHANES]    Frequency of Communication with Friends and Family: More than three times a week    Frequency of Social Gatherings with Friends and Family: More than three times a week    Attends Religious Services: Never    Marine scientist or Organizations: Yes    Attends Music therapist: More than 4 times per year    Marital Status: Married  Human resources officer Violence: Not At Risk (01/28/2022)   Humiliation, Afraid, Rape, and Kick questionnaire    Fear of Current or Ex-Partner: No    Emotionally Abused: No    Physically Abused: No    Sexually Abused: No    Outpatient Medications Prior to Visit  Medication Sig Dispense Refill   Cholecalciferol (VITAMIN D3) 125 MCG (5000 UT) CAPS Take 1 capsule (5,000 Units total) by mouth daily. 30 capsule 0   clotrimazole-betamethasone (LOTRISONE) cream Apply 1 application topically 2 (two) times daily. Use as needed for rash 30 g 1   cyclobenzaprine (FLEXERIL) 10 MG tablet Take 1 tablet (10 mg total) by mouth 3 (three) times daily as needed for muscle spasms. 60 tablet 3   dexamethasone (DECADRON) 2 MG tablet Take 2 tablets by mouth every 8 hours for 3 days, then 1 tablet every 8 hours for 3 days, then 1 tablet every 12 hours for 3 days, then half a tablet every 12 hours for 3 days, and then half a tablet daily for 2 days. 37 tablet 0   fluticasone (FLONASE) 50 MCG/ACT nasal spray Place 2 sprays into both nostrils daily as  needed for allergies.     hydrochlorothiazide (HYDRODIURIL) 25 MG tablet TAKE 1/2 TABLET(12.5 MG) BY MOUTH EVERY MORNING 45 tablet 1   Lactobacillus-Inulin (CULTURELLE DIGESTIVE DAILY) CAPS Take 1 capsule by mouth daily.     lisinopril (ZESTRIL) 20 MG  tablet TAKE 1 TABLET BY MOUTH DAILY 90 tablet 1   melatonin 3 MG TABS tablet Take 3 mg by mouth at bedtime.     Multiple Vitamins-Minerals (ICAPS AREDS 2) CAPS Take 1 capsule by mouth in the morning and at bedtime.     Olopatadine HCl (PATADAY) 0.2 % SOLN 1 gtt each eye qd  0   rosuvastatin (CRESTOR) 20 MG tablet Take 1 tablet (20 mg total) by mouth daily. 90 tablet 0   albuterol (VENTOLIN HFA) 108 (90 Base) MCG/ACT inhaler Inhale 2 puffs into the lungs every 6 (six) hours as needed for wheezing or shortness of breath. (Patient not taking: Reported on 09/02/2022) 6.7 g 2   silver sulfADIAZINE (SILVADENE) 1 % cream Apply 1 application on to the skin daily. (Patient not taking: Reported on 09/02/2022) 50 g 0   terbinafine (LAMISIL) 250 MG tablet Take 1 tablet (250 mg total) by mouth daily. (Patient not taking: Reported on 09/02/2022) 120 tablet 1   No facility-administered medications prior to visit.    Allergies  Allergen Reactions   Lactose Intolerance (Gi)     Review of Systems  Constitutional:  Negative for fever and malaise/fatigue.  HENT:  Negative for congestion.   Eyes:  Negative for blurred vision.  Respiratory:  Negative for cough and shortness of breath.   Cardiovascular:  Negative for chest pain, palpitations and leg swelling.  Gastrointestinal:  Negative for vomiting.  Musculoskeletal:  Positive for neck pain (Right). Negative for back pain.  Skin:  Negative for rash.       (+) Scratches (Left Forearm)  Neurological:  Positive for dizziness. Negative for loss of consciousness and headaches.       Objective:    Physical Exam Vitals and nursing note reviewed.  Constitutional:      General: She is not in acute distress.     Appearance: Normal appearance. She is not ill-appearing.  HENT:     Head: Normocephalic and atraumatic.     Right Ear: External ear normal.     Left Ear: External ear normal.  Eyes:     Extraocular Movements: Extraocular movements intact.     Pupils: Pupils are equal, round, and reactive to light.  Cardiovascular:     Rate and Rhythm: Normal rate and regular rhythm.     Heart sounds: Normal heart sounds. No murmur heard.    No gallop.  Pulmonary:     Effort: Pulmonary effort is normal. No respiratory distress.     Breath sounds: Normal breath sounds. No wheezing or rales.  Skin:    General: Skin is warm and dry.  Neurological:     Mental Status: She is alert and oriented to person, place, and time.  Psychiatric:        Judgment: Judgment normal.     BP 126/78 (BP Location: Left Arm, Patient Position: Sitting, Cuff Size: Normal)   Pulse 83   Temp 97.8 F (36.6 C) (Oral)   Resp 18   Ht 5' (1.524 m)   Wt 161 lb 3.2 oz (73.1 kg)   SpO2 95%   BMI 31.48 kg/m  Wt Readings from Last 3 Encounters:  11/24/22 161 lb 3.2 oz (73.1 kg)  10/12/22 160 lb (72.6 kg)  09/15/22 161 lb (73 kg)    Diabetic Foot Exam - Simple   No data filed    Lab Results  Component Value Date   WBC 6.7 09/16/2022   HGB 13.9 09/16/2022   HCT 42.7 09/16/2022  PLT 268.0 09/16/2022   GLUCOSE 76 11/24/2022   CHOL 193 11/24/2022   TRIG 190.0 (H) 11/24/2022   HDL 46.90 11/24/2022   LDLDIRECT 129.1 08/23/2007   LDLCALC 108 (H) 11/24/2022   ALT 14 11/24/2022   AST 17 11/24/2022   NA 145 11/24/2022   K 4.9 11/24/2022   CL 105 11/24/2022   CREATININE 0.61 11/24/2022   BUN 20 11/24/2022   CO2 30 11/24/2022   TSH 1.29 09/15/2019   INR 1.0 08/08/2022   HGBA1C 5.9 (H) 08/02/2020    Lab Results  Component Value Date   TSH 1.29 09/15/2019   Lab Results  Component Value Date   WBC 6.7 09/16/2022   HGB 13.9 09/16/2022   HCT 42.7 09/16/2022   MCV 90.0 09/16/2022   PLT 268.0 09/16/2022   Lab  Results  Component Value Date   NA 145 11/24/2022   K 4.9 11/24/2022   CO2 30 11/24/2022   GLUCOSE 76 11/24/2022   BUN 20 11/24/2022   CREATININE 0.61 11/24/2022   BILITOT 0.5 11/24/2022   ALKPHOS 77 11/24/2022   AST 17 11/24/2022   ALT 14 11/24/2022   PROT 6.7 11/24/2022   ALBUMIN 4.1 11/24/2022   CALCIUM 9.9 11/24/2022   ANIONGAP 6 07/08/2021   GFR 83.90 11/24/2022   Lab Results  Component Value Date   CHOL 193 11/24/2022   Lab Results  Component Value Date   HDL 46.90 11/24/2022   Lab Results  Component Value Date   LDLCALC 108 (H) 11/24/2022   Lab Results  Component Value Date   TRIG 190.0 (H) 11/24/2022   Lab Results  Component Value Date   CHOLHDL 4 11/24/2022   Lab Results  Component Value Date   HGBA1C 5.9 (H) 08/02/2020       Assessment & Plan:   Problem List Items Addressed This Visit       Unprioritized   Hyperlipidemia    Encourage heart healthy diet such as MIND or DASH diet, increase exercise, avoid trans fats, simple carbohydrates and processed foods, consider a krill or fish or flaxseed oil cap daily.        Relevant Orders   Lipid panel (Completed)   Comprehensive metabolic panel (Completed)   Essential hypertension - Primary    Well controlled, no changes to meds. Encouraged heart healthy diet such as the DASH diet and exercise as tolerated.        Relevant Orders   Lipid panel (Completed)   Comprehensive metabolic panel (Completed)   No orders of the defined types were placed in this encounter.   IAnn Held, Velasquez, personally preformed the services described in this documentation.  All medical record entries made by the scribe were at my direction and in my presence.  I have reviewed the chart and discharge instructions (if applicable) and agree that the record reflects my personal performance and is accurate and complete. 11/24/2022   I,Amber Collins,acting as a scribe for Cindy Held, Velasquez.,have documented  all relevant documentation on the behalf of Cindy Held, Velasquez,as directed by  Cindy Held, Velasquez while in the presence of Cindy Held, Velasquez.    Cindy Held, Velasquez

## 2022-12-02 NOTE — Assessment & Plan Note (Signed)
Well controlled, no changes to meds. Encouraged heart healthy diet such as the DASH diet and exercise as tolerated.  °

## 2022-12-02 NOTE — Assessment & Plan Note (Signed)
Encourage heart healthy diet such as MIND or DASH diet, increase exercise, avoid trans fats, simple carbohydrates and processed foods, consider a krill or fish or flaxseed oil cap daily.  °

## 2022-12-23 DIAGNOSIS — H04223 Epiphora due to insufficient drainage, bilateral lacrimal glands: Secondary | ICD-10-CM | POA: Diagnosis not present

## 2022-12-23 DIAGNOSIS — H10413 Chronic giant papillary conjunctivitis, bilateral: Secondary | ICD-10-CM | POA: Diagnosis not present

## 2022-12-23 DIAGNOSIS — H02422 Myogenic ptosis of left eyelid: Secondary | ICD-10-CM | POA: Diagnosis not present

## 2022-12-23 DIAGNOSIS — H43812 Vitreous degeneration, left eye: Secondary | ICD-10-CM | POA: Diagnosis not present

## 2022-12-23 DIAGNOSIS — Z961 Presence of intraocular lens: Secondary | ICD-10-CM | POA: Diagnosis not present

## 2022-12-23 DIAGNOSIS — H353132 Nonexudative age-related macular degeneration, bilateral, intermediate dry stage: Secondary | ICD-10-CM | POA: Diagnosis not present

## 2022-12-26 ENCOUNTER — Other Ambulatory Visit (HOSPITAL_BASED_OUTPATIENT_CLINIC_OR_DEPARTMENT_OTHER): Payer: Self-pay

## 2022-12-26 MED ORDER — COMIRNATY 30 MCG/0.3ML IM SUSY
PREFILLED_SYRINGE | INTRAMUSCULAR | 0 refills | Status: DC
  Filled 2022-12-26: qty 0.3, 1d supply, fill #0

## 2022-12-26 MED ORDER — FLUAD QUADRIVALENT 0.5 ML IM PRSY
PREFILLED_SYRINGE | INTRAMUSCULAR | 0 refills | Status: DC
  Filled 2022-12-26: qty 0.5, 1d supply, fill #0

## 2023-01-05 ENCOUNTER — Ambulatory Visit: Payer: Medicare HMO | Admitting: Family Medicine

## 2023-01-05 ENCOUNTER — Encounter: Payer: Self-pay | Admitting: Family Medicine

## 2023-01-05 ENCOUNTER — Ambulatory Visit (INDEPENDENT_AMBULATORY_CARE_PROVIDER_SITE_OTHER): Payer: Medicare HMO | Admitting: Family Medicine

## 2023-01-05 VITALS — BP 120/78 | HR 82 | Temp 97.6°F | Resp 18 | Ht 60.0 in | Wt 159.0 lb

## 2023-01-05 DIAGNOSIS — E785 Hyperlipidemia, unspecified: Secondary | ICD-10-CM | POA: Diagnosis not present

## 2023-01-05 DIAGNOSIS — K580 Irritable bowel syndrome with diarrhea: Secondary | ICD-10-CM | POA: Diagnosis not present

## 2023-01-05 DIAGNOSIS — E559 Vitamin D deficiency, unspecified: Secondary | ICD-10-CM | POA: Diagnosis not present

## 2023-01-05 DIAGNOSIS — I10 Essential (primary) hypertension: Secondary | ICD-10-CM

## 2023-01-05 DIAGNOSIS — F439 Reaction to severe stress, unspecified: Secondary | ICD-10-CM

## 2023-01-05 DIAGNOSIS — G4700 Insomnia, unspecified: Secondary | ICD-10-CM

## 2023-01-05 MED ORDER — ESCITALOPRAM OXALATE 10 MG PO TABS: 10.0000 mg | ORAL_TABLET | Freq: Every day | ORAL | 2 refills | Status: DC

## 2023-01-05 MED ORDER — TRAZODONE HCL 50 MG PO TABS: 25.0000 mg | ORAL_TABLET | Freq: Every evening | ORAL | 3 refills | Status: DC | PRN

## 2023-01-05 NOTE — Patient Instructions (Signed)
Managing Hyperlipidemia After viewing this video, you will be able to describe how our body uses cholesterol, and the diagnosis and treatment of hyperlipidemia. To view the content, go to this web address: [email or web address]  This video will expire on: 08/12/2024. If you need access to this video following this date, please reach out to the healthcare provider who assigned it to you. This information is not intended to replace advice given to you by your health care provider. Make sure you discuss any questions you have with your health care provider. Elsevier Patient Education  [place].

## 2023-01-05 NOTE — Assessment & Plan Note (Signed)
Lexapro daily  Trazadone qhs as needed Return to office 1 month or sooner as needed

## 2023-01-05 NOTE — Progress Notes (Addendum)
Subjective:   By signing my name below, I, Eugene Gavia, attest that this documentation has been prepared under the direction and in the presence of Ann Held, DO 01/05/23   Patient ID: Cindy Velasquez, female    DOB: January 03, 1941, 82 y.o.   MRN: [id number]  Chief Complaint  Patient presents with   Stress    Pt has been able to sleep and been taking care of her husband. Pt has been unable to sleep and GI concerns. Pt having diarrhea and a lot burping.     HPI Patient is in today for evaluation of stress and sleep disturbance. She is accompanied by her daughter today.  Per patient's daughter, the patient is often only able to sleep for 4 hours or so before waking up. This has been a problem for many years but has worsened. She reports her stress/anxiety prevents her from being unable to go back to sleep. She is using Melatonin '3mg'$  nightly without any relief of her insomnia.   She has been taking care of her husband so she has had difficulty relaxing as she is often very worried about him and thinking about all of her responsibilities. He has a history of prior stroke, COPD, and DM. Patient's husband was diagnosed with DM and recently changed medications due to a worsening A1c. He has been wearing a Libre CGM and she has been following these readings. She often wakes up in the middle of the night to check his blood sugars. She is worried as he will no longer have the monitor in 2 weeks unless his insurance will cover it. Her daughter states that the patient feels responsible for managing all of his health concerns.  She has had issues with diarrhea, reflux, and fatigue x2-3 days per month which she attributes to her stress and Hx of diverticulosis. She does not like to take medications so she does not use any OTC medications during her episodes of diarrhea. She is using a daily probiotic with some relief.  Past Medical History:  Diagnosis Date   Adenomatous colon polyp    Cancer  ([place]) 03/2015   [place] R tibia    Difficult intubation    Diverticulosis of colon    GERD (gastroesophageal reflux disease)    Hip pain    Hyperlipidemia    Hyperplastic colon polyp    Hypertension    Lactose intolerance    Loose stools    Lower back pain    Osteoporosis    Palpitations     Past Surgical History:  Procedure Laterality Date   ANTERIOR CERVICAL DECOMP/DISCECTOMY FUSION N/A 09/11/2022   Procedure: CERVICAL FIVE-SIX ANTERIOR CERVICAL DECOMPRESSION/DISCECTOMY FUSION;  Surgeon: Vallarie Mare, MD;  Location: [facility];  Service: Neurosurgery;  Laterality: N/A;   EYE SURGERY  2012   B/L 01/2011  02/2011   HAMMER TOE SURGERY     Bilateral   ROTATOR CUFF REPAIR     Left   TUBAL LIGATION      Family History  Problem Relation Age of Onset   Hypertension Mother    Cancer Mother 27       ?   Hyperlipidemia Mother    Obesity Mother    Hypertension Father    Stroke Father    Heart disease Father    Hypertension Brother    Testicular cancer Brother 27   Hypertension Brother    Heart disease Brother        quadruple bypass  Colon cancer Neg Hx    Stomach cancer Neg Hx    Throat cancer Neg Hx     Social History   Socioeconomic History   Marital status: Married    Spouse name: Nehal Witting   Number of children: 4   Years of education: Not on file   Highest education level: Not on file  Occupational History   Occupation: retired--[employer]  Tobacco Use   Smoking status: Former    Packs/day: 0.50    Years: 30.00    Total pack years: 15.00    Types: Cigarettes    Quit date: 06/02/1991    Years since quitting: 31.6   Smokeless tobacco: Never  Vaping Use   Vaping Use: Never used  Substance and Sexual Activity   Alcohol use: Yes    Alcohol/week: 2.0 standard drinks of alcohol    Types: 2 Glasses of wine per week   Drug use: No   Sexual activity: Not Currently    Partners: Male  Other Topics Concern   Not on file  Social History  Narrative   Exercise--  [employer]--yoga and cardio 3x a week      Right Handed    Lives in a one story home    Social Determinants of Health   Financial Resource Strain: Low Risk  (01/28/2022)   Overall Financial Resource Strain (CARDIA)    Difficulty of Paying Living Expenses: Not hard at all  Food Insecurity: No Food Insecurity (01/28/2022)   Hunger Vital Sign    Worried About Running Out of Food in the Last Year: Never true    [place] in the Last Year: Never true  Transportation Needs: No Transportation Needs (01/28/2022)   PRAPARE - Hydrologist (Medical): No    Lack of Transportation (Non-Medical): No  Physical Activity: Sufficiently Active (01/28/2022)   Exercise Vital Sign    Days of Exercise per Week: 3 days    Minutes of Exercise per Session: 60 min  Stress: No Stress Concern Present (01/28/2022)   [place]    Feeling of Stress : Not at all  Social Connections: Moderately Integrated (01/28/2022)   Social Connection and Isolation Panel [NHANES]    Frequency of Communication with Friends and Family: More than three times a week    Frequency of Social Gatherings with Friends and Family: More than three times a week    Attends Religious Services: Never    Marine scientist or Organizations: Yes    Attends Music therapist: More than 4 times per year    Marital Status: Married  Human resources officer Violence: Not At Risk (01/28/2022)   Humiliation, Afraid, Rape, and Kick questionnaire    Fear of Current or Ex-Partner: No    Emotionally Abused: No    Physically Abused: No    Sexually Abused: No    Outpatient Medications Prior to Visit  Medication Sig Dispense Refill   Cholecalciferol (VITAMIN D3) 125 MCG (5000 UT) CAPS Take 1 capsule (5,000 Units total) by mouth daily. 30 capsule 0   clotrimazole-betamethasone (LOTRISONE) cream Apply 1 application topically 2 (two)  times daily. Use as needed for rash 30 g 1   cyclobenzaprine (FLEXERIL) 10 MG tablet Take 1 tablet (10 mg total) by mouth 3 (three) times daily as needed for muscle spasms. 60 tablet 3   dexamethasone (DECADRON) 2 MG tablet Take 2 tablets by mouth every 8  hours for 3 days, then 1 tablet every 8 hours for 3 days, then 1 tablet every 12 hours for 3 days, then half a tablet every 12 hours for 3 days, and then half a tablet daily for 2 days. 37 tablet 0   fluticasone (FLONASE) 50 MCG/ACT nasal spray Place 2 sprays into both nostrils daily as needed for allergies.     hydrochlorothiazide (HYDRODIURIL) 25 MG tablet TAKE 1/2 TABLET(12.5 MG) BY MOUTH EVERY MORNING 45 tablet 1   Lactobacillus-Inulin (CULTURELLE DIGESTIVE DAILY) CAPS Take 1 capsule by mouth daily.     lisinopril (ZESTRIL) 20 MG tablet TAKE 1 TABLET BY MOUTH DAILY 90 tablet 1   melatonin 3 MG TABS tablet Take 3 mg by mouth at bedtime.     Multiple Vitamins-Minerals (ICAPS AREDS 2) CAPS Take 1 capsule by mouth in the morning and at bedtime.     Olopatadine HCl (PATADAY) 0.2 % SOLN 1 gtt each eye qd  0   rosuvastatin (CRESTOR) 20 MG tablet Take 1 tablet (20 mg total) by mouth daily. 90 tablet 0   COVID-19 mRNA vaccine 2023-2024 (COMIRNATY) syringe Inject into the muscle. (Patient not taking: Reported on 01/05/2023) 0.3 mL 0   influenza vaccine adjuvanted (FLUAD QUADRIVALENT) 0.5 ML injection Inject into the muscle. (Patient not taking: Reported on 01/05/2023) 0.5 mL 0   No facility-administered medications prior to visit.    Allergies  Allergen Reactions   Lactose Intolerance (Gi)     Review of Systems  Constitutional:  Negative for fever and malaise/fatigue.  HENT:  Negative for congestion, sinus pain and sore throat.   Eyes:  Negative for blurred vision.  Respiratory:  Negative for cough, shortness of breath and wheezing.   Cardiovascular:  Negative for chest pain, palpitations and leg swelling.  Gastrointestinal:  Negative for  abdominal pain, constipation, diarrhea, nausea and vomiting.  Genitourinary:  Negative for dysuria, frequency and hematuria.  Musculoskeletal:  Negative for back pain.  Skin:  Negative for rash.  Neurological:  Negative for loss of consciousness and headaches.  Psychiatric/Behavioral:  The patient is nervous/anxious and has insomnia.        Objective:    Physical Exam Vitals and nursing note reviewed.  Constitutional:      General: She is not in acute distress.    Appearance: Normal appearance. She is not ill-appearing.  HENT:     Head: Normocephalic and atraumatic.     Right Ear: External ear normal.     Left Ear: External ear normal.  Eyes:     Extraocular Movements: Extraocular movements intact.     Pupils: Pupils are equal, round, and reactive to light.  Cardiovascular:     Rate and Rhythm: Normal rate and regular rhythm.     Heart sounds: Normal heart sounds. No murmur heard.    No gallop.  Pulmonary:     Effort: Pulmonary effort is normal. No respiratory distress.     Breath sounds: Normal breath sounds. No wheezing or rales.  Skin:    General: Skin is warm and dry.  Neurological:     Mental Status: She is alert and oriented to person, place, and time.  Psychiatric:        Attention and Perception: Attention normal.        Mood and Affect: Mood and affect normal.        Speech: Speech normal.        Behavior: Behavior normal. Behavior is cooperative.  Thought Content: Thought content normal.        Judgment: Judgment normal.     Comments: Pt daughter is with her She has been under a lot of stress and is not sleeping      BP 120/78 (BP Location: Left Arm, Patient Position: Sitting, Cuff Size: Normal)   Pulse 82   Temp 97.6 F (36.4 C) (Oral)   Resp 18   Ht 5' (1.524 m)   Wt 159 lb (72.1 kg)   SpO2 96%   BMI 31.05 kg/m  Wt Readings from Last 3 Encounters:  01/05/23 159 lb (72.1 kg)  11/24/22 161 lb 3.2 oz (73.1 kg)  10/12/22 160 lb (72.6 kg)        Assessment & Plan:  Essential hypertension -     CBC with Differential/Platelet -     Comprehensive metabolic panel -     Lipid panel -     TSH  Hyperlipidemia, unspecified hyperlipidemia type -     CBC with Differential/Platelet -     Comprehensive metabolic panel -     Lipid panel -     TSH  Vitamin D deficiency -     VITAMIN D 25 Hydroxy (Vit-D Deficiency, Fractures)  Irritable bowel syndrome with diarrhea Assessment & Plan: Inc fiber in diet Probiotic daily Can take immodium if needed  If immodium does not help call to be seen do not con't to take  Orders: -     CBC with Differential/Platelet -     Comprehensive metabolic panel -     Lipid panel -     TSH  Stress at home Assessment & Plan: Lexapro daily  Trazadone qhs as needed Return to office 1 month or sooner as needed   Orders: -     Escitalopram Oxalate; Take 1 tablet (10 mg total) by mouth daily.  Dispense: 30 tablet; Refill: 2  Insomnia, unspecified type -     traZODone HCl; Take 0.5-1 tablets (25-50 mg total) by mouth at bedtime as needed for sleep.  Dispense: 30 tablet; Refill: 3     I,Alexis Herring,acting as a scribe for [employer], DO.,have documented all relevant documentation on the behalf of Ann Held, DO,as directed by  Ann Held, DO while in the presence of [place], DO, personally preformed the services described in this documentation.  All medical record entries made by the scribe were at my direction and in my presence.  I have reviewed the chart and discharge instructions (if applicable) and agree that the record reflects my personal performance and is accurate and complete. 01/05/23   Ann Held, DO

## 2023-01-05 NOTE — Assessment & Plan Note (Signed)
Inc fiber in diet Probiotic daily Can take immodium if needed  If immodium does not help call to be seen do not con't to take

## 2023-01-06 LAB — COMPREHENSIVE METABOLIC PANEL
ALT: 20 U/L (ref 0–35)
AST: 20 U/L (ref 0–37)
Albumin: 4.1 g/dL (ref 3.5–5.2)
Alkaline Phosphatase: 87 U/L (ref 39–117)
BUN: 21 mg/dL (ref 6–23)
CO2: 30 mEq/L (ref 19–32)
Calcium: 9.9 mg/dL (ref 8.4–10.5)
Chloride: 102 mEq/L (ref 96–112)
Creatinine, Ser: 0.66 mg/dL (ref 0.40–1.20)
GFR: 82.25 mL/min (ref >60.00)
Glucose, Bld: 92 mg/dL (ref 70–99)
Potassium: 5 mEq/L (ref 3.5–5.1)
Sodium: 141 mEq/L (ref 135–145)
Total Bilirubin: 0.5 mg/dL (ref 0.2–1.2)
Total Protein: 7 g/dL (ref 6.0–8.3)

## 2023-01-06 LAB — TSH: TSH: 1.59 u[IU]/mL (ref 0.35–5.50)

## 2023-01-06 LAB — CBC WITH DIFFERENTIAL/PLATELET
Basophils Absolute: 0.1 10*3/uL (ref 0.0–0.1)
Basophils Relative: 1.2 % (ref 0.0–3.0)
Eosinophils Absolute: 0.4 10*3/uL (ref 0.0–0.7)
Eosinophils Relative: 4.6 % (ref 0.0–5.0)
HCT: 43.4 % (ref 36.0–46.0)
Hemoglobin: 14.3 g/dL (ref 12.0–15.0)
Lymphocytes Relative: 28.3 % (ref 12.0–46.0)
Lymphs Abs: 2.2 10*3/uL (ref 0.7–4.0)
MCHC: 33 g/dL (ref 30.0–36.0)
MCV: 89.1 fl (ref 78.0–100.0)
Monocytes Absolute: 0.9 10*3/uL (ref 0.1–1.0)
Monocytes Relative: 11.1 % (ref 3.0–12.0)
Neutro Abs: 4.3 10*3/uL (ref 1.4–7.7)
Neutrophils Relative %: 54.8 % (ref 43.0–77.0)
Platelets: 299 10*3/uL (ref 150.0–400.0)
RBC: 4.87 Mil/uL (ref 3.87–5.11)
RDW: 14.5 % (ref 11.5–15.5)
WBC: 7.8 10*3/uL (ref 4.0–10.5)

## 2023-01-06 LAB — LIPID PANEL
Cholesterol: 156 mg/dL (ref 0–200)
HDL: 43.9 mg/dL (ref >39.00)
LDL Cholesterol: 83 mg/dL (ref 0–99)
NonHDL: 112.03
Total CHOL/HDL Ratio: 4
Triglycerides: 147 mg/dL (ref 0.0–149.0)
VLDL: 29.4 mg/dL (ref 0.0–40.0)

## 2023-01-06 LAB — VITAMIN D 25 HYDROXY (VIT D DEFICIENCY, FRACTURES): VITD: 35.41 ng/mL (ref 30.00–100.00)

## 2023-01-27 ENCOUNTER — Encounter: Payer: Self-pay | Admitting: Family Medicine

## 2023-01-27 ENCOUNTER — Ambulatory Visit (INDEPENDENT_AMBULATORY_CARE_PROVIDER_SITE_OTHER): Payer: Medicare HMO | Admitting: Family Medicine

## 2023-01-27 ENCOUNTER — Other Ambulatory Visit (HOSPITAL_BASED_OUTPATIENT_CLINIC_OR_DEPARTMENT_OTHER): Payer: Self-pay

## 2023-01-27 VITALS — BP 132/70 | HR 71 | Temp 97.9°F | Resp 12 | Ht 60.0 in | Wt 154.4 lb

## 2023-01-27 DIAGNOSIS — F439 Reaction to severe stress, unspecified: Secondary | ICD-10-CM

## 2023-01-27 DIAGNOSIS — R1013 Epigastric pain: Secondary | ICD-10-CM | POA: Diagnosis not present

## 2023-01-27 MED ORDER — OMEPRAZOLE 20 MG PO CPDR: 20.0000 mg | DELAYED_RELEASE_CAPSULE | Freq: Every day | ORAL | 3 refills | Status: DC

## 2023-01-27 MED ORDER — OMEPRAZOLE 20 MG PO CPDR
20.0000 mg | DELAYED_RELEASE_CAPSULE | Freq: Every day | ORAL | 3 refills | Status: DC
  Filled 2023-01-27: qty 30, 30d supply, fill #0

## 2023-01-27 NOTE — Progress Notes (Signed)
Subjective:   By signing my name below, I, [name], attest that this documentation has been prepared under the direction and in the presence of Ann Held, DO. 01/27/2023   Patient ID: Cindy Velasquez, female    DOB: March 23, 1941, 82 y.o.   MRN: [id number]  Chief Complaint  Patient presents with   follow up stress    HPI Patient is in today for a follow up visit.   She has been taking 10 mg lexapro for the past 3 days. She is sleeping at least 6 hours a night which is more than prior to starting the medication.   She complains of belching frequently, feeling gaseous, nausea, and upset stomach for the past 6-7 weeks. Her symptoms were worse last weekend and reports she developed abdominal pain as well. She is taking metamucil 1x daily and finds it helps her symptoms. She started   Past Medical History:  Diagnosis Date   Adenomatous colon polyp    Cancer ([place]) 03/2015   [place] tibia    Difficult intubation    Diverticulosis of colon    GERD (gastroesophageal reflux disease)    Hip pain    Hyperlipidemia    Hyperplastic colon polyp    Hypertension    Lactose intolerance    Loose stools    Lower back pain    Osteoporosis    Palpitations     Past Surgical History:  Procedure Laterality Date   ANTERIOR CERVICAL DECOMP/DISCECTOMY FUSION N/A 09/11/2022   Procedure: CERVICAL FIVE-SIX ANTERIOR CERVICAL DECOMPRESSION/DISCECTOMY FUSION;  Surgeon: Vallarie Mare, MD;  Location: [facility];  Service: Neurosurgery;  Laterality: N/A;   EYE SURGERY  2012   B/L 01/2011  02/2011   HAMMER TOE SURGERY     Bilateral   ROTATOR CUFF REPAIR     Left   TUBAL LIGATION      Family History  Problem Relation Age of Onset   Hypertension Mother    Cancer Mother 2       ?   Hyperlipidemia Mother    Obesity Mother    Hypertension Father    Stroke Father    Heart disease Father    Hypertension Brother    Testicular cancer Brother 22   Hypertension Brother    Heart disease  Brother        quadruple bypass   Colon cancer Neg Hx    Stomach cancer Neg Hx    Throat cancer Neg Hx     Social History   Socioeconomic History   Marital status: Married    Spouse name: Cindy Velasquez   Number of children: 4   Years of education: Not on file   Highest education level: Not on file  Occupational History   Occupation: retired--[employer]  Tobacco Use   Smoking status: Former    Packs/day: 0.50    Years: 30.00    Total pack years: 15.00    Types: Cigarettes    Quit date: 06/02/1991    Years since quitting: 31.6   Smokeless tobacco: Never  Vaping Use   Vaping Use: Never used  Substance and Sexual Activity   Alcohol use: Yes    Alcohol/week: 2.0 standard drinks of alcohol    Types: 2 Glasses of wine per week   Drug use: No   Sexual activity: Not Currently    Partners: Male  Other Topics Concern   Not on file  Social History Narrative   Exercise--  [employer]--yoga  and cardio 3x a week      Right Handed    Lives in a one story home    Social Determinants of Health   Financial Resource Strain: Low Risk  (01/28/2022)   Overall Financial Resource Strain (CARDIA)    Difficulty of Paying Living Expenses: Not hard at all  Food Insecurity: No Food Insecurity (01/28/2022)   Hunger Vital Sign    Worried About Running Out of Food in the Last Year: Never true    Ran Out of Food in the Last Year: Never true  Transportation Needs: No Transportation Needs (01/28/2022)   PRAPARE - Hydrologist (Medical): No    Lack of Transportation (Non-Medical): No  Physical Activity: Sufficiently Active (01/28/2022)   Exercise Vital Sign    Days of Exercise per Week: 3 days    Minutes of Exercise per Session: 60 min  Stress: No Stress Concern Present (01/28/2022)   [place]    Feeling of Stress : Not at all  Social Connections: Moderately Integrated (01/28/2022)    Social Connection and Isolation Panel [NHANES]    Frequency of Communication with Friends and Family: More than three times a week    Frequency of Social Gatherings with Friends and Family: More than three times a week    Attends Religious Services: Never    Marine scientist or Organizations: Yes    Attends Music therapist: More than 4 times per year    Marital Status: Married  Human resources officer Violence: Not At Risk (01/28/2022)   Humiliation, Afraid, Rape, and Kick questionnaire    Fear of Current or Ex-Partner: No    Emotionally Abused: No    Physically Abused: No    Sexually Abused: No    Outpatient Medications Prior to Visit  Medication Sig Dispense Refill   Cholecalciferol (VITAMIN D3) 125 MCG (5000 UT) CAPS Take 1 capsule (5,000 Units total) by mouth daily. 30 capsule 0   clotrimazole-betamethasone (LOTRISONE) cream Apply 1 application topically 2 (two) times daily. Use as needed for rash 30 g 1   escitalopram (LEXAPRO) 10 MG tablet Take 1 tablet (10 mg total) by mouth daily. 30 tablet 2   fluticasone (FLONASE) 50 MCG/ACT nasal spray Place 2 sprays into both nostrils daily as needed for allergies.     hydrochlorothiazide (HYDRODIURIL) 25 MG tablet TAKE 1/2 TABLET(12.5 MG) BY MOUTH EVERY MORNING 45 tablet 1   Lactobacillus-Inulin (CULTURELLE DIGESTIVE DAILY) CAPS Take 1 capsule by mouth daily.     lisinopril (ZESTRIL) 20 MG tablet TAKE 1 TABLET BY MOUTH DAILY 90 tablet 1   Multiple Vitamins-Minerals (ICAPS AREDS 2) CAPS Take 1 capsule by mouth in the morning and at bedtime.     Olopatadine HCl (PATADAY) 0.2 % SOLN 1 gtt each eye qd  0   rosuvastatin (CRESTOR) 20 MG tablet Take 1 tablet (20 mg total) by mouth daily. 90 tablet 0   traZODone (DESYREL) 50 MG tablet Take 0.5-1 tablets (25-50 mg total) by mouth at bedtime as needed for sleep. 30 tablet 3   cyclobenzaprine (FLEXERIL) 10 MG tablet Take 1 tablet (10 mg total) by mouth 3 (three) times daily as needed for  muscle spasms. 60 tablet 3   dexamethasone (DECADRON) 2 MG tablet Take 2 tablets by mouth every 8 hours for 3 days, then 1 tablet every 8 hours for 3 days, then 1 tablet every 12 hours for 3 days, then  half a tablet every 12 hours for 3 days, and then half a tablet daily for 2 days. 37 tablet 0   melatonin 3 MG TABS tablet Take 3 mg by mouth at bedtime.     No facility-administered medications prior to visit.    Allergies  Allergen Reactions   Lactose Intolerance (Gi)     Review of Systems  Constitutional:  Negative for fever and malaise/fatigue.  HENT:  Negative for congestion.   Eyes:  Negative for blurred vision.  Respiratory:  Negative for cough and shortness of breath.   Cardiovascular:  Negative for chest pain, palpitations and leg swelling.  Gastrointestinal:  Positive for abdominal pain, heartburn and nausea. Negative for vomiting.       (+)belching (+)gaseous  Musculoskeletal:  Negative for back pain.  Skin:  Negative for rash.  Neurological:  Negative for loss of consciousness and headaches.       Objective:    Physical Exam Vitals and nursing note reviewed.  Constitutional:      General: She is not in acute distress.    Appearance: Normal appearance. She is not ill-appearing.  HENT:     Head: Normocephalic and atraumatic.     Right Ear: External ear normal.     Left Ear: External ear normal.  Eyes:     Extraocular Movements: Extraocular movements intact.     Pupils: Pupils are equal, round, and reactive to light.  Cardiovascular:     Rate and Rhythm: Normal rate and regular rhythm.     Heart sounds: Normal heart sounds. No murmur heard.    No gallop.  Pulmonary:     Effort: Pulmonary effort is normal. No respiratory distress.     Breath sounds: Normal breath sounds. No wheezing or rales.  Abdominal:     Palpations: Abdomen is soft.     Tenderness: There is no abdominal tenderness. There is no guarding.  Skin:    General: Skin is warm and dry.   Neurological:     Mental Status: She is alert and oriented to person, place, and time.  Psychiatric:        Judgment: Judgment normal.     BP 132/70 (BP Location: Left Arm, Cuff Size: Normal)   Pulse 71   Temp 97.9 F (36.6 C) (Oral)   Resp 12   Ht 5' (1.524 m)   Wt 154 lb 6.4 oz (70 kg)   SpO2 94%   BMI 30.15 kg/m  Wt Readings from Last 3 Encounters:  01/27/23 154 lb 6.4 oz (70 kg)  01/05/23 159 lb (72.1 kg)  11/24/22 161 lb 3.2 oz (73.1 kg)       Assessment & Plan:  Dyspepsia Assessment & Plan: Omeprazole daily  Check labs  HO on gerd diet given to pt  Consider GI if no better   Orders: -     CBC with Differential/Platelet -     Comprehensive metabolic panel -     H. pylori antibody, IgG -     Omeprazole; Take 1 capsule (20 mg total) by mouth daily.  Dispense: 30 capsule; Refill: 3  Stress at home Assessment & Plan: Lexapro 10 mg daily  Pt is sleeping much better      I, Ann Held, DO, personally preformed the services described in this documentation.  All medical record entries made by the scribe were at my direction and in my presence.  I have reviewed the chart and discharge instructions (if applicable) and agree that  the record reflects my personal performance and is accurate and complete. 01/27/2023   I,[name],acting as a scribe for Ann Held, DO.,have documented all relevant documentation on the behalf of Ann Held, DO,as directed by  Ann Held, DO while in the presence of Ann Held, DO.   Ann Held, DO

## 2023-01-27 NOTE — Assessment & Plan Note (Signed)
Lexapro 10 mg daily  Pt is sleeping much better

## 2023-01-27 NOTE — Patient Instructions (Addendum)
Call [phone] to schedule mammogram   Food Choices for Gastroesophageal Reflux Disease, Adult When you have gastroesophageal reflux disease (GERD), the foods you eat and your eating habits are very important. Choosing the right foods can help ease the discomfort of GERD. Consider working with a dietitian to help you make healthy food choices. What are tips for following this plan? Reading food labels Look for foods that are low in saturated fat. Foods that have less than 5% of daily value (DV) of fat and 0 g of trans fats may help with your symptoms. Cooking Cook foods using methods other than frying. This may include baking, steaming, grilling, or broiling. These are all methods that do not need a lot of fat for cooking. To add flavor, try to use herbs that are low in spice and acidity. Meal planning  Choose healthy foods that are low in fat, such as fruits, vegetables, whole grains, low-fat dairy products, lean meats, fish, and poultry. Eat frequent, small meals instead of three large meals each day. Eat your meals slowly, in a relaxed setting. Avoid bending over or lying down until 2-3 hours after eating. Limit high-fat foods such as fatty meats or fried foods. Limit your intake of fatty foods, such as oils, butter, and shortening. Avoid the following as told by your health care provider: Foods that cause symptoms. These may be different for different people. Keep a food diary to keep track of foods that cause symptoms. Alcohol. Drinking large amounts of liquid with meals. Eating meals during the 2-3 hours before bed. Lifestyle Maintain a healthy weight. Ask your health care provider what weight is healthy for you. If you need to lose weight, work with your health care provider to do so safely. Exercise for at least 30 minutes on 5 or more days each week, or as told by your health care provider. Avoid wearing clothes that fit tightly around your waist and chest. Do not use any  products that contain nicotine or tobacco. These products include cigarettes, chewing tobacco, and vaping devices, such as e-cigarettes. If you need help quitting, ask your health care provider. Sleep with the head of your bed raised. Use a wedge under the mattress or blocks under the bed frame to raise the head of the bed. Chew sugar-free gum after mealtimes. What foods should I eat?  Eat a healthy, well-balanced diet of fruits, vegetables, whole grains, low-fat dairy products, lean meats, fish, and poultry. Each person is different. Foods that may trigger symptoms in one person may not trigger any symptoms in another person. Work with your health care provider to identify foods that are safe for you. The items listed above may not be a complete list of recommended foods and beverages. Contact a dietitian for more information. What foods should I avoid? Limiting some of these foods may help manage the symptoms of GERD. Everyone is different. Consult a dietitian or your health care provider to help you identify the exact foods to avoid, if any. Fruits Any fruits prepared with added fat. Any fruits that cause symptoms. For some people this may include citrus fruits, such as oranges, grapefruit, pineapple, and lemons. Vegetables Deep-fried vegetables. [place] fries. Any vegetables prepared with added fat. Any vegetables that cause symptoms. For some people, this may include tomatoes and tomato products, chili peppers, onions and garlic, and horseradish. Grains Pastries or quick breads with added fat. Meats and other proteins High-fat meats, such as fatty beef or pork, hot dogs, ribs, ham, sausage,  salami, and bacon. Fried meat or protein, including fried fish and fried chicken. Nuts and nut butters, in large amounts. Dairy Whole milk and chocolate milk. Sour cream. Cream. Ice cream. Cream cheese. Milkshakes. Fats and oils Butter. Margarine. Shortening. Ghee. Beverages Coffee and tea, with or  without caffeine. Carbonated beverages. Sodas. Energy drinks. Fruit juice made with acidic fruits, such as orange or grapefruit. Tomato juice. Alcoholic drinks. Sweets and desserts Chocolate and cocoa. Donuts. Seasonings and condiments Pepper. Peppermint and spearmint. Added salt. Any condiments, herbs, or seasonings that cause symptoms. For some people, this may include curry, hot sauce, or vinegar-based salad dressings. The items listed above may not be a complete list of foods and beverages to avoid. Contact a dietitian for more information. Questions to ask your health care provider Diet and lifestyle changes are usually the first steps that are taken to manage symptoms of GERD. If diet and lifestyle changes do not improve your symptoms, talk with your health care provider about taking medicines. Where to find more information International Foundation for Gastrointestinal Disorders: aboutgerd.org Summary When you have gastroesophageal reflux disease (GERD), food and lifestyle choices may be very helpful in easing the discomfort of GERD. Eat frequent, small meals instead of three large meals each day. Eat your meals slowly, in a relaxed setting. Avoid bending over or lying down until 2-3 hours after eating. Limit high-fat foods such as fatty meats or fried foods. This information is not intended to replace advice given to you by your health care provider. Make sure you discuss any questions you have with your health care provider. Document Revised: 06/04/2020 Document Reviewed: 06/04/2020 Elsevier Patient Education  [place].

## 2023-01-27 NOTE — Assessment & Plan Note (Signed)
Omeprazole daily  Check labs  HO on gerd diet given to pt  Consider GI if no better

## 2023-01-28 LAB — COMPREHENSIVE METABOLIC PANEL
ALT: 22 U/L (ref 0–35)
AST: 22 U/L (ref 0–37)
Albumin: 4.2 g/dL (ref 3.5–5.2)
Alkaline Phosphatase: 111 U/L (ref 39–117)
BUN: 20 mg/dL (ref 6–23)
CO2: 27 mEq/L (ref 19–32)
Calcium: 10.1 mg/dL (ref 8.4–10.5)
Chloride: 100 mEq/L (ref 96–112)
Creatinine, Ser: 0.74 mg/dL (ref 0.40–1.20)
GFR: 75.83 mL/min (ref >60.00)
Glucose, Bld: 99 mg/dL (ref 70–99)
Potassium: 4.5 mEq/L (ref 3.5–5.1)
Sodium: 142 mEq/L (ref 135–145)
Total Bilirubin: 0.5 mg/dL (ref 0.2–1.2)
Total Protein: 7.3 g/dL (ref 6.0–8.3)

## 2023-01-28 LAB — CBC WITH DIFFERENTIAL/PLATELET
Basophils Absolute: 0.1 10*3/uL (ref 0.0–0.1)
Basophils Relative: 0.9 % (ref 0.0–3.0)
Eosinophils Absolute: 0.4 10*3/uL (ref 0.0–0.7)
Eosinophils Relative: 5.5 % — ABNORMAL HIGH (ref 0.0–5.0)
HCT: 42.9 % (ref 36.0–46.0)
Hemoglobin: 14 g/dL (ref 12.0–15.0)
Lymphocytes Relative: 29 % (ref 12.0–46.0)
Lymphs Abs: 2.1 10*3/uL (ref 0.7–4.0)
MCHC: 32.8 g/dL (ref 30.0–36.0)
MCV: 88.5 fl (ref 78.0–100.0)
Monocytes Absolute: 0.7 10*3/uL (ref 0.1–1.0)
Monocytes Relative: 10.2 % (ref 3.0–12.0)
Neutro Abs: 4 10*3/uL (ref 1.4–7.7)
Neutrophils Relative %: 54.4 % (ref 43.0–77.0)
Platelets: 303 10*3/uL (ref 150.0–400.0)
RBC: 4.84 Mil/uL (ref 3.87–5.11)
RDW: 14.5 % (ref 11.5–15.5)
WBC: 7.3 10*3/uL (ref 4.0–10.5)

## 2023-01-28 LAB — H. PYLORI ANTIBODY, IGG: H Pylori IgG: NEGATIVE

## 2023-01-31 ENCOUNTER — Other Ambulatory Visit: Payer: Self-pay | Admitting: Family Medicine

## 2023-02-04 DIAGNOSIS — M47812 Spondylosis without myelopathy or radiculopathy, cervical region: Secondary | ICD-10-CM | POA: Diagnosis not present

## 2023-02-04 DIAGNOSIS — M4712 Other spondylosis with myelopathy, cervical region: Secondary | ICD-10-CM | POA: Diagnosis not present

## 2023-02-05 ENCOUNTER — Ambulatory Visit: Payer: Medicare HMO | Admitting: Family Medicine

## 2023-02-17 ENCOUNTER — Telehealth: Payer: Self-pay | Admitting: Family Medicine

## 2023-02-17 NOTE — Telephone Encounter (Signed)
Copied from [place] [id number]. Topic: Medicare AWV >> Feb 17, 2023 10:19 AM Devoria Glassing wrote: Reason for CRM: Called patient to schedule Medicare Annual Wellness Visit (AWV). Left message for patient to call back and schedule Medicare Annual Wellness Visit (AWV).  Last date of AWV: 01/28/2022  Please schedule an appointment at any time with Beatris Ship, CMA   If any questions, please contact me.  Thank you ,  Sherol Dade; [place] Direct Dial: [phone]

## 2023-03-09 ENCOUNTER — Telehealth: Payer: Self-pay | Admitting: Family Medicine

## 2023-03-09 NOTE — Telephone Encounter (Signed)
Copied from [place] [id number] [id number]. Topic: Medicare AWV >> Mar 09, 2023  1:21 PM Devoria Glassing wrote: Reason for CRM: Called patient to schedule Medicare Annual Wellness Visit (AWV). Left message for patient to call back and schedule Medicare Annual Wellness Visit (AWV).  Last date of AWV: 02/2022  Please schedule an appointment at any time with Beatris Ship, [place]  .  If any questions, please contact me.  Thank you ,  Sherol Dade; [place] Direct Dial: [phone]

## 2023-03-22 ENCOUNTER — Other Ambulatory Visit: Payer: Self-pay | Admitting: Family Medicine

## 2023-03-22 DIAGNOSIS — I10 Essential (primary) hypertension: Secondary | ICD-10-CM

## 2023-04-05 ENCOUNTER — Other Ambulatory Visit: Payer: Self-pay | Admitting: Family Medicine

## 2023-04-05 DIAGNOSIS — F439 Reaction to severe stress, unspecified: Secondary | ICD-10-CM

## 2023-04-16 ENCOUNTER — Encounter (INDEPENDENT_AMBULATORY_CARE_PROVIDER_SITE_OTHER): Payer: Medicare HMO | Admitting: Ophthalmology

## 2023-04-16 DIAGNOSIS — I10 Essential (primary) hypertension: Secondary | ICD-10-CM | POA: Diagnosis not present

## 2023-04-16 DIAGNOSIS — H43813 Vitreous degeneration, bilateral: Secondary | ICD-10-CM | POA: Diagnosis not present

## 2023-04-16 DIAGNOSIS — H353132 Nonexudative age-related macular degeneration, bilateral, intermediate dry stage: Secondary | ICD-10-CM

## 2023-04-16 DIAGNOSIS — H35033 Hypertensive retinopathy, bilateral: Secondary | ICD-10-CM

## 2023-04-17 ENCOUNTER — Emergency Department (HOSPITAL_BASED_OUTPATIENT_CLINIC_OR_DEPARTMENT_OTHER)
Admission: EM | Admit: 2023-04-17 | Discharge: 2023-04-17 | Disposition: A | Discharge status: Home / Self Care | Payer: Medicare HMO | Attending: Emergency Medicine | Admitting: Emergency Medicine

## 2023-04-17 ENCOUNTER — Emergency Department (HOSPITAL_BASED_OUTPATIENT_CLINIC_OR_DEPARTMENT_OTHER): Payer: Medicare HMO

## 2023-04-17 ENCOUNTER — Encounter (HOSPITAL_BASED_OUTPATIENT_CLINIC_OR_DEPARTMENT_OTHER): Payer: Self-pay

## 2023-04-17 ENCOUNTER — Other Ambulatory Visit: Payer: Self-pay

## 2023-04-17 DIAGNOSIS — W19XXXA Unspecified fall, initial encounter: Secondary | ICD-10-CM | POA: Insufficient documentation

## 2023-04-17 DIAGNOSIS — S8992XA Unspecified injury of left lower leg, initial encounter: Secondary | ICD-10-CM | POA: Diagnosis present

## 2023-04-17 DIAGNOSIS — S82002A Unspecified fracture of left patella, initial encounter for closed fracture: Secondary | ICD-10-CM | POA: Diagnosis not present

## 2023-04-17 DIAGNOSIS — Z79899 Other long term (current) drug therapy: Secondary | ICD-10-CM | POA: Insufficient documentation

## 2023-04-17 DIAGNOSIS — S82032A Displaced transverse fracture of left patella, initial encounter for closed fracture: Secondary | ICD-10-CM | POA: Diagnosis not present

## 2023-04-17 DIAGNOSIS — S82042A Displaced comminuted fracture of left patella, initial encounter for closed fracture: Secondary | ICD-10-CM | POA: Diagnosis not present

## 2023-04-17 MED ORDER — ONDANSETRON 4 MG PO TBDP: 4.0000 mg | ORAL_TABLET | Freq: Three times a day (TID) | ORAL | 0 refills | Status: DC | PRN

## 2023-04-17 MED ORDER — OXYCODONE-ACETAMINOPHEN 5-325 MG PO TABS: 1.0000 | ORAL_TABLET | Freq: Three times a day (TID) | ORAL | 0 refills | Status: DC | PRN

## 2023-04-17 MED ORDER — OXYCODONE-ACETAMINOPHEN 5-325 MG PO TABS
1.0000 | ORAL_TABLET | Freq: Once | ORAL | Status: AC
  Administered 2023-04-17: 1 via ORAL
  Filled 2023-04-17: qty 1

## 2023-04-17 MED ORDER — ONDANSETRON 4 MG PO TBDP
4.0000 mg | ORAL_TABLET | Freq: Once | ORAL | Status: AC
  Administered 2023-04-17: 4 mg via ORAL
  Filled 2023-04-17: qty 1

## 2023-04-17 NOTE — ED Triage Notes (Signed)
Pt reports losing balance and falling on L knee. No thinners, no LOC.

## 2023-04-17 NOTE — ED Provider Notes (Signed)
[place] [facility] Provider Note   CSN: [id number] Arrival date & time: 04/17/23  1958     History  Chief Complaint  Patient presents with   Cindy Velasquez is a 82 y.o. female.   Fall  Patient presents with left knee injury.  States that she lost balance and fell onto her left knee with it bent.  Now cannot walk on it and cannot straighten out the knee.  Did not hit anything else.  No other pain.  Not on blood thinners.  Had seen Dr. Magnus Ivan for the left knee in the past but did not have surgery.     Home Medications Prior to Admission medications   Medication Sig Start Date End Date Taking? Authorizing Provider  ondansetron (ZOFRAN-ODT) 4 MG disintegrating tablet Take 1 tablet (4 mg total) by mouth every 8 (eight) hours as needed for nausea or vomiting. 04/17/23  Yes Benjiman Core, MD  oxyCODONE-acetaminophen (PERCOCET/ROXICET) 5-325 MG tablet Take 1-2 tablets by mouth every 8 (eight) hours as needed for severe pain. 04/17/23  Yes Benjiman Core, MD  Cholecalciferol (VITAMIN D3) 125 MCG (5000 UT) CAPS Take 1 capsule (5,000 Units total) by mouth daily. 04/05/19   Alois Cliche, PA-C  clotrimazole-betamethasone (LOTRISONE) cream Apply 1 application topically 2 (two) times daily. Use as needed for rash 12/11/20   Zola Button, Myrene Buddy R, DO  escitalopram (LEXAPRO) 10 MG tablet TAKE 1 TABLET(10 MG) BY MOUTH DAILY 04/06/23   Zola Button, Grayling Congress, DO  fluticasone (FLONASE) 50 MCG/ACT nasal spray Place 2 sprays into both nostrils daily as needed for allergies.    [provider]  hydrochlorothiazide (HYDRODIURIL) 25 MG tablet TAKE 1/2 TABLET(12.5 MG) BY MOUTH EVERY MORNING 11/10/22   Zola Button, Grayling Congress, DO  Lactobacillus-Inulin (CULTURELLE DIGESTIVE DAILY) CAPS Take 1 capsule by mouth daily.    [provider]  lisinopril (ZESTRIL) 20 MG tablet TAKE 1 TABLET BY MOUTH DAILY 03/23/23   Zola Button, Grayling Congress, DO  Multiple  Vitamins-Minerals (ICAPS AREDS 2) CAPS Take 1 capsule by mouth in the morning and at bedtime.    [provider]  Olopatadine HCl (PATADAY) 0.2 % SOLN 1 gtt each eye qd 05/23/22   Zola Button, Grayling Congress, DO  omeprazole (PRILOSEC) 20 MG capsule Take 1 capsule (20 mg total) by mouth daily. 01/27/23   Seabron Spates R, DO  rosuvastatin (CRESTOR) 20 MG tablet TAKE 1 TABLET(20 MG) BY MOUTH DAILY 02/02/23   Zola Button, Grayling Congress, DO  traZODone (DESYREL) 50 MG tablet Take 0.5-1 tablets (25-50 mg total) by mouth at bedtime as needed for sleep. 01/05/23   Donato Schultz, DO      Allergies    Lactose intolerance (gi)    Review of Systems   Review of Systems  Physical Exam Updated Vital Signs BP (!) 124/94 (BP Location: Right Arm)   Pulse 70   Temp 97.9 F (36.6 C) (Oral)   Resp 20   Ht 4\' 11"  (1.499 m)   Wt 68.5 kg   SpO2 93%   BMI 30.50 kg/m  Physical Exam Vitals and nursing note reviewed.  Chest:     Chest wall: No tenderness.  Abdominal:     Tenderness: There is no abdominal tenderness.  Musculoskeletal:     Comments: Large knee effusion on the left with the abrasion.  Knee effusion is anterior and I can palpate the fragments of the patella.  Unable  to keep leg straight actively.  Neurovascular intact in foot.  No tenderness over ankle or hip.  Skin:    Capillary Refill: Capillary refill takes less than 2 seconds.  Neurological:     Mental Status: She is alert.     ED Results / Procedures / Treatments   Labs (all labs ordered are listed, but only abnormal results are displayed) Labs Reviewed - No data to display  EKG None  Radiology DG Knee Complete 4 Views Left  Result Date: 04/17/2023 CLINICAL DATA:  Status post fall. EXAM: LEFT KNEE - COMPLETE 4+ VIEW COMPARISON:  Apr 16, 2020 FINDINGS: An acute fracture of the left patella is seen, with approximately 4.3 cm displacement of the dorsal and ventral fragments. There is no evidence of dislocation. Marked  severity anterior soft tissue swelling is seen at the previously noted fracture site. A small to moderate sized joint effusion is also noted. IMPRESSION: Acute, displaced fracture of the left patella. Electronically Signed   By: Aram Candela M.D.   On: 04/17/2023 20:40    Procedures Procedures    Medications Ordered in ED Medications  ondansetron (ZOFRAN-ODT) disintegrating tablet 4 mg (has no administration in time range)  oxyCODONE-acetaminophen (PERCOCET/ROXICET) 5-325 MG per tablet 1 tablet (has no administration in time range)    ED Course/ Medical Decision Making/ A&P                             Medical Decision Making Amount and/or Complexity of Data Reviewed Radiology: ordered.  Patient with fall.  Left knee injury with clinically broken patella.  X-ray independently interpreted does show patella fracture which is displaced.  Has seen Ortho care in the past and would like to stay with them.  Discussed with Dr. Steward Drone, who is the doctor on-call.  States she can call Dr. Magnus Ivan on Monday or if he cannot get her in she can always follow-up with [facility] office.        Final Clinical Impression(s) / ED Diagnoses Final diagnoses:  Closed displaced transverse fracture of left patella, initial encounter    Rx / DC Orders ED Discharge Orders          Ordered    oxyCODONE-acetaminophen (PERCOCET/ROXICET) 5-325 MG tablet  Every 8 hours PRN        04/17/23 2106    ondansetron (ZOFRAN-ODT) 4 MG disintegrating tablet  Every 8 hours PRN        04/17/23 2106              Benjiman Core, MD 04/17/23 2107

## 2023-04-19 ENCOUNTER — Other Ambulatory Visit: Payer: Self-pay

## 2023-04-19 ENCOUNTER — Emergency Department (HOSPITAL_BASED_OUTPATIENT_CLINIC_OR_DEPARTMENT_OTHER)
Admission: EM | Admit: 2023-04-19 | Discharge: 2023-04-19 | Disposition: A | Discharge status: Home / Self Care | Payer: Medicare HMO | Attending: Emergency Medicine | Admitting: Emergency Medicine

## 2023-04-19 ENCOUNTER — Encounter (HOSPITAL_BASED_OUTPATIENT_CLINIC_OR_DEPARTMENT_OTHER): Payer: Self-pay

## 2023-04-19 DIAGNOSIS — Z79899 Other long term (current) drug therapy: Secondary | ICD-10-CM | POA: Insufficient documentation

## 2023-04-19 DIAGNOSIS — R110 Nausea: Secondary | ICD-10-CM | POA: Diagnosis not present

## 2023-04-19 DIAGNOSIS — R55 Syncope and collapse: Secondary | ICD-10-CM

## 2023-04-19 DIAGNOSIS — R609 Edema, unspecified: Secondary | ICD-10-CM | POA: Diagnosis not present

## 2023-04-19 DIAGNOSIS — I10 Essential (primary) hypertension: Secondary | ICD-10-CM | POA: Insufficient documentation

## 2023-04-19 LAB — BASIC METABOLIC PANEL
Anion gap: 12 (ref 5–15)
BUN: 27 mg/dL — ABNORMAL HIGH (ref 8–23)
CO2: 28 mmol/L (ref 22–32)
Calcium: 8.9 mg/dL (ref 8.9–10.3)
Chloride: 100 mmol/L (ref 98–111)
Creatinine, Ser: 1.04 mg/dL — ABNORMAL HIGH (ref 0.44–1.00)
GFR, Estimated: 54 mL/min — ABNORMAL LOW (ref >60)
Glucose, Bld: 170 mg/dL — ABNORMAL HIGH (ref 70–99)
Potassium: 3.6 mmol/L (ref 3.5–5.1)
Sodium: 140 mmol/L (ref 135–145)

## 2023-04-19 LAB — CBC WITH DIFFERENTIAL/PLATELET
Abs Immature Granulocytes: 0.03 10*3/uL (ref 0.00–0.07)
Basophils Absolute: 0 10*3/uL (ref 0.0–0.1)
Basophils Relative: 0 %
Eosinophils Absolute: 0.3 10*3/uL (ref 0.0–0.5)
Eosinophils Relative: 3 %
HCT: 37.4 % (ref 36.0–46.0)
Hemoglobin: 11.7 g/dL — ABNORMAL LOW (ref 12.0–15.0)
Immature Granulocytes: 0 %
Lymphocytes Relative: 17 %
Lymphs Abs: 1.5 10*3/uL (ref 0.7–4.0)
MCH: 28.1 pg (ref 26.0–34.0)
MCHC: 31.3 g/dL (ref 30.0–36.0)
MCV: 89.9 fL (ref 80.0–100.0)
Monocytes Absolute: 0.6 10*3/uL (ref 0.1–1.0)
Monocytes Relative: 7 %
Neutro Abs: 6.5 10*3/uL (ref 1.7–7.7)
Neutrophils Relative %: 73 %
Platelets: 192 10*3/uL (ref 150–400)
RBC: 4.16 MIL/uL (ref 3.87–5.11)
RDW: 15.9 % — ABNORMAL HIGH (ref 11.5–15.5)
WBC: 9 10*3/uL (ref 4.0–10.5)
nRBC: 0 % (ref 0.0–0.2)

## 2023-04-19 MED ORDER — SODIUM CHLORIDE 0.9 % IV BOLUS
500.0000 mL | Freq: Once | INTRAVENOUS | Status: AC
  Administered 2023-04-19: 500 mL via INTRAVENOUS

## 2023-04-19 NOTE — ED Triage Notes (Signed)
Patient was getting into the shower. She had a near syncopal episode.  She had one episode of vomiting. She fx her left knee Friday after a fall.

## 2023-04-19 NOTE — ED Provider Notes (Signed)
[place] [facility] Provider Note   CSN: [id number] Arrival date & time: 04/19/23  1019     History  Chief Complaint  Patient presents with   Near Syncope    Cindy Velasquez is a 82 y.o. female.  Patient here after lightheaded episode.  Normal vitals.  No fever.  Patient with history of hypertension, high cholesterol.  Recently seen here and diagnosed with a left patellar fracture after she had a mechanical fall.  Patient states that she was using the bathroom and having a bowel movement.  Then was working on getting transferred into the shower when she felt lightheaded and warm.  Family member was with her to try to help her transfer into the shower when she was able to be guided back down into her wheelchair.  May be she had some brief loss of consciousness but did not sound like she had any seizure activity.  Was at her baseline pretty quickly afterwards.  Denies any chest pain or shortness of breath.  She is feeling much better now.  The history is provided by the patient and a caregiver.       Home Medications Prior to Admission medications   Medication Sig Start Date End Date Taking? Authorizing Provider  Cholecalciferol (VITAMIN D3) 125 MCG (5000 UT) CAPS Take 1 capsule (5,000 Units total) by mouth daily. 04/05/19   Alois Cliche, PA-C  clotrimazole-betamethasone (LOTRISONE) cream Apply 1 application topically 2 (two) times daily. Use as needed for rash 12/11/20   Zola Button, Myrene Buddy R, DO  escitalopram (LEXAPRO) 10 MG tablet TAKE 1 TABLET(10 MG) BY MOUTH DAILY 04/06/23   Zola Button, Grayling Congress, DO  fluticasone (FLONASE) 50 MCG/ACT nasal spray Place 2 sprays into both nostrils daily as needed for allergies.    [provider]  hydrochlorothiazide (HYDRODIURIL) 25 MG tablet TAKE 1/2 TABLET(12.5 MG) BY MOUTH EVERY MORNING 11/10/22   Zola Button, Grayling Congress, DO  Lactobacillus-Inulin (CULTURELLE DIGESTIVE DAILY) CAPS Take 1 capsule by mouth  daily.    [provider]  lisinopril (ZESTRIL) 20 MG tablet TAKE 1 TABLET BY MOUTH DAILY 03/23/23   Zola Button, Grayling Congress, DO  Multiple Vitamins-Minerals (ICAPS AREDS 2) CAPS Take 1 capsule by mouth in the morning and at bedtime.    [provider]  Olopatadine HCl (PATADAY) 0.2 % SOLN 1 gtt each eye qd 05/23/22   Zola Button, Grayling Congress, DO  omeprazole (PRILOSEC) 20 MG capsule Take 1 capsule (20 mg total) by mouth daily. 01/27/23   Seabron Spates R, DO  ondansetron (ZOFRAN-ODT) 4 MG disintegrating tablet Take 1 tablet (4 mg total) by mouth every 8 (eight) hours as needed for nausea or vomiting. 04/17/23   Benjiman Core, MD  oxyCODONE-acetaminophen (PERCOCET/ROXICET) 5-325 MG tablet Take 1-2 tablets by mouth every 8 (eight) hours as needed for severe pain. 04/17/23   Benjiman Core, MD  rosuvastatin (CRESTOR) 20 MG tablet TAKE 1 TABLET(20 MG) BY MOUTH DAILY 02/02/23   Zola Button, Grayling Congress, DO  traZODone (DESYREL) 50 MG tablet Take 0.5-1 tablets (25-50 mg total) by mouth at bedtime as needed for sleep. 01/05/23   Donato Schultz, DO      Allergies    Lactose intolerance (gi)    Review of Systems   Review of Systems  Physical Exam Updated Vital Signs BP (!) 124/51 (BP Location: Right Arm)   Pulse 69   Temp (!) 97.5 F (36.4 C) (Oral)   Resp  18   Ht 4\' 11"  (1.499 m)   Wt 65.8 kg   SpO2 94%   BMI 29.30 kg/m  Physical Exam Vitals and nursing note reviewed.  Constitutional:      General: She is not in acute distress.    Appearance: She is well-developed. She is not ill-appearing.  HENT:     Head: Normocephalic and atraumatic.     Nose: Nose normal.     Mouth/Throat:     Mouth: Mucous membranes are moist.  Eyes:     Extraocular Movements: Extraocular movements intact.     Conjunctiva/sclera: Conjunctivae normal.     Pupils: Pupils are equal, round, and reactive to light.  Cardiovascular:     Rate and Rhythm: Normal rate and regular rhythm.      Pulses: Normal pulses.     Heart sounds: Normal heart sounds. No murmur heard. Pulmonary:     Effort: Pulmonary effort is normal. No respiratory distress.     Breath sounds: Normal breath sounds.  Abdominal:     Palpations: Abdomen is soft.     Tenderness: There is no abdominal tenderness.  Musculoskeletal:        General: No swelling.     Cervical back: Normal range of motion and neck supple.  Skin:    General: Skin is warm and dry.     Capillary Refill: Capillary refill takes less than 2 seconds.  Neurological:     General: No focal deficit present.     Mental Status: She is alert and oriented to person, place, and time.     Cranial Nerves: No cranial nerve deficit.     Sensory: No sensory deficit.     Motor: No weakness.     Coordination: Coordination normal.     Comments: 5+ out of 5 strength throughout, normal sensation, no drift, normal finger-nose-finger, normal speech  Psychiatric:        Mood and Affect: Mood normal.     ED Results / Procedures / Treatments   Labs (all labs ordered are listed, but only abnormal results are displayed) Labs Reviewed  CBC WITH DIFFERENTIAL/PLATELET - Abnormal; Notable for the following components:      Result Value   Hemoglobin 11.7 (*)    RDW 15.9 (*)    All other components within normal limits  BASIC METABOLIC PANEL - Abnormal; Notable for the following components:   Glucose, Bld 170 (*)    BUN 27 (*)    Creatinine, Ser 1.04 (*)    GFR, Estimated 54 (*)    All other components within normal limits    EKG EKG Interpretation  Date/Time:  Sunday Apr 19 2023 10:30:14 EDT Ventricular Rate:  69 PR Interval:  138 QRS Duration: 101 QT Interval:  441 QTC Calculation: 473 R Axis:   72 Text Interpretation: Sinus rhythm Confirmed by Virgina Norfolk ([id number]) on 04/19/2023 10:44:21 AM  Radiology DG Knee Complete 4 Views Left  Result Date: 04/17/2023 CLINICAL DATA:  Status post fall. EXAM: LEFT KNEE - COMPLETE 4+ VIEW COMPARISON:  Apr 16, 2020 FINDINGS: An acute fracture of the left patella is seen, with approximately 4.3 cm displacement of the dorsal and ventral fragments. There is no evidence of dislocation. Marked severity anterior soft tissue swelling is seen at the previously noted fracture site. A small to moderate sized joint effusion is also noted. IMPRESSION: Acute, displaced fracture of the left patella. Electronically Signed   By: Aram Candela M.D.   On: 04/17/2023 20:40  Procedures Procedures    Medications Ordered in ED Medications  sodium chloride 0.9 % bolus 500 mL (0 mLs Intravenous Stopped 04/19/23 1129)    ED Course/ Medical Decision Making/ A&P                             Medical Decision Making Amount and/or Complexity of Data Reviewed Labs: ordered.   Cindy Velasquez is here after near syncope episode.  Hypertension high cholesterol history.  Recently injured her left patella.  She has been in a knee immobilizer.  Episode of near syncope/loss of consciousness after sitting having a bowel movement.  She was being guided from that to the wheelchair to the shower.  She started to feel lightheaded and nauseous.  She was guided back down to her wheelchair and did not fall or hit her head.  Symptoms resolved very quickly.  She is feeling much better now.  Denies any chest pain or shortness of breath.  She has been doing pretty well with a knee immobilizer and wheelchair at home.  Differential diagnosis is likely vasovagal type event as symptoms happen after being on the toilet.  She is asymptomatic now.  Seems less likely to be cardiac or pulmonary process.  Will check basic labs and electrolytes and give fluid bolus.  Will check EKG.  She has a normal neurological exam.  Per my review and interpretation of labs is no significant anemia or electrolyte abnormality or kidney injury or leukocytosis.  EKG shows sinus rhythm.  No ischemic changes.  Overall suspect vasovagal type event.  Discharged in good  condition.  Understands return precautions.  This chart was dictated using voice recognition software.  Despite best efforts to proofread,  errors can occur which can change the documentation meaning.         Final Clinical Impression(s) / ED Diagnoses Final diagnoses:  Near syncope    Rx / DC Orders ED Discharge Orders     None         Virgina Norfolk, DO 04/19/23 1129
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
## 2023-04-21 ENCOUNTER — Ambulatory Visit (INDEPENDENT_AMBULATORY_CARE_PROVIDER_SITE_OTHER): Payer: Medicare HMO | Admitting: Orthopaedic Surgery

## 2023-04-21 ENCOUNTER — Encounter: Payer: Self-pay | Admitting: Orthopaedic Surgery

## 2023-04-21 DIAGNOSIS — S82042A Displaced comminuted fracture of left patella, initial encounter for closed fracture: Secondary | ICD-10-CM

## 2023-04-22 ENCOUNTER — Ambulatory Visit: Payer: Medicare HMO | Admitting: Orthopaedic Surgery

## 2023-04-22 ENCOUNTER — Encounter (HOSPITAL_COMMUNITY): Payer: Self-pay | Admitting: Orthopaedic Surgery

## 2023-04-22 ENCOUNTER — Other Ambulatory Visit: Payer: Self-pay

## 2023-04-22 NOTE — Anesthesia Preprocedure Evaluation (Addendum)
Anesthesia Evaluation  Patient identified by MRN, date of birth, ID band Patient awake    Reviewed: Allergy & Precautions, H&P , NPO status , Patient's Chart, lab work & pertinent test results  History of Anesthesia Complications (+) DIFFICULT AIRWAY and history of anesthetic complications  Airway Mallampati: II  TM Distance: >3 FB Neck ROM: Full    Dental no notable dental hx. (+) Teeth Intact, Dental Advisory Given,    Pulmonary former smoker   Pulmonary exam normal breath sounds clear to auscultation       Cardiovascular hypertension, Pt. on medications Normal cardiovascular exam Rhythm:Regular Rate:Normal     Neuro/Psych negative neurological ROS  negative psych ROS   GI/Hepatic negative GI ROS, Neg liver ROS,GERD  ,,  Endo/Other  negative endocrine ROS    Renal/GU negative Renal ROSLab Results      Component                Value               Date                      CREATININE               1.04 (H)            04/19/2023                BUN                      27 (H)              04/19/2023                NA                       140                 04/19/2023                K                        3.6                 04/19/2023                CL                       100                 04/19/2023                CO2                      28                  04/19/2023             negative genitourinary   Musculoskeletal negative musculoskeletal ROS (+) Arthritis ,    Abdominal   Peds negative pediatric ROS (+)  Hematology negative hematology ROS (+) Lab Results      Component                Value               Date  WBC                      9.0                 04/19/2023                HGB                      11.7 (L)            04/19/2023                HCT                      37.4                04/19/2023                MCV                      89.9                04/19/2023                 PLT                      192                 04/19/2023              Anesthesia Other Findings   Reproductive/Obstetrics negative OB ROS                             Anesthesia Physical Anesthesia Plan  ASA: 2  Anesthesia Plan: General   Post-op Pain Management: Regional block*, Minimal or no pain anticipated, Precedex and Ofirmev IV (intra-op)*   Induction: Intravenous  PONV Risk Score and Plan: 4 or greater and Treatment may vary due to age or medical condition and Ondansetron  Airway Management Planned: LMA  Additional Equipment: None  Intra-op Plan:   Post-operative Plan:   Informed Consent: I have reviewed the patients History and Physical, chart, labs and discussed the procedure including the risks, benefits and alternatives for the proposed anesthesia with the patient or authorized representative who has indicated his/her understanding and acceptance.     Dental advisory given  Plan Discussed with: CRNA and Anesthesiologist  Anesthesia Plan Comments: (L adductor Plus LMA)        Anesthesia Quick Evaluation

## 2023-04-22 NOTE — Progress Notes (Addendum)
-------------  SDW INSTRUCTIONS given:  Your procedure is scheduled on Thursday, May 16th, 2024.  Report to [facility] Main Entrance "A" at 11:00 A.M., and check in at the Admitting office.  Call this number if you have problems the morning of surgery:  [phone]   Remember:  Do not eat after midnight the night before your surgery  You may drink clear liquids until 11:00 the morning of your surgery.   Clear liquids allowed are: Water, Non-Citrus Juices (without pulp), Carbonated Beverages, Clear Tea, Black Coffee Only, and Gatorade    Take these medicines the morning of surgery with A SIP OF WATER:  Prilosec, Crestor PRN: Tylenol, Flonase, eye drops  As of today, STOP taking any Aspirin (unless otherwise instructed by your surgeon) Aleve, Naproxen, Ibuprofen, Motrin, Advil, Goody's, BC's, all herbal medications, fish oil, and all vitamins.   The day of surgery:                     Do not wear jewelry, make up, or nail polish            Do not wear lotions, powders, perfumes, or deodorant.            Do not shave 48 hours prior to surgery.              Do not bring valuables to the hospital.            [facility] is not responsible for any belongings or valuables.  Do NOT Smoke (Tobacco/Vaping) 24 hours prior to your procedure If you use a CPAP at night, you may bring all equipment for your overnight stay.   Contacts, glasses, dentures or bridgework may not be worn into surgery.      For patients admitted to the hospital, discharge time will be determined by your treatment team.   Patients discharged the day of surgery will not be allowed to drive home, and someone needs to stay with them for 24 hours.    Special instructions:   [place]- Preparing For Surgery  Before surgery, you can play an important role. Because skin is not sterile, your skin needs to be as free of germs as possible. You can reduce the number of germs on your skin by washing with CHG  (chlorahexidine gluconate) Soap before surgery.  CHG is an antiseptic cleaner which kills germs and bonds with the skin to continue killing germs even after washing.    Oral Hygiene is also important to reduce your risk of infection.  Remember - BRUSH YOUR TEETH THE MORNING OF SURGERY WITH YOUR REGULAR TOOTHPASTE  Please do not use if you have an allergy to CHG or antibacterial soaps. If your skin becomes reddened/irritated stop using the CHG.  Do not shave (including legs and underarms) for at least 48 hours prior to first CHG shower. It is OK to shave your face.  Please follow these instructions carefully.   Shower the NIGHT BEFORE SURGERY and the MORNING OF SURGERY with DIAL Soap.   Pat yourself dry with a CLEAN TOWEL.  Wear CLEAN PAJAMAS to bed the night before surgery  Place CLEAN SHEETS on your bed the night of your first shower and DO NOT SLEEP WITH PETS.   Day of Surgery: Please shower morning of surgery  Wear Clean/Comfortable clothing the morning of surgery Do not apply any deodorants/lotions.   Remember to brush your teeth WITH YOUR REGULAR TOOTHPASTE.   Questions were answered. Patient  verbalized understanding of instructions.   Anesthesia notified

## 2023-04-23 ENCOUNTER — Inpatient Hospital Stay (HOSPITAL_COMMUNITY)
Admission: RE | Admit: 2023-04-23 | Discharge: 2023-04-29 | DRG: 517 | Disposition: A | Discharge status: Skilled Nursing Facility | Payer: Medicare HMO | Attending: Orthopaedic Surgery | Admitting: Orthopaedic Surgery

## 2023-04-23 ENCOUNTER — Ambulatory Visit (HOSPITAL_BASED_OUTPATIENT_CLINIC_OR_DEPARTMENT_OTHER): Payer: Medicare HMO | Admitting: Anesthesiology

## 2023-04-23 ENCOUNTER — Encounter (HOSPITAL_COMMUNITY)
Admission: RE | Disposition: A | Discharge status: Skilled Nursing Facility | Payer: Self-pay | Source: Home / Self Care | Attending: Orthopaedic Surgery

## 2023-04-23 ENCOUNTER — Ambulatory Visit (HOSPITAL_COMMUNITY): Payer: Medicare HMO | Admitting: Anesthesiology

## 2023-04-23 ENCOUNTER — Encounter (HOSPITAL_COMMUNITY): Payer: Self-pay | Admitting: Orthopaedic Surgery

## 2023-04-23 ENCOUNTER — Ambulatory Visit (HOSPITAL_COMMUNITY): Payer: Medicare HMO

## 2023-04-23 ENCOUNTER — Other Ambulatory Visit: Payer: Self-pay

## 2023-04-23 DIAGNOSIS — Z981 Arthrodesis status: Secondary | ICD-10-CM | POA: Diagnosis not present

## 2023-04-23 DIAGNOSIS — Z8249 Family history of ischemic heart disease and other diseases of the circulatory system: Secondary | ICD-10-CM | POA: Diagnosis not present

## 2023-04-23 DIAGNOSIS — Z79899 Other long term (current) drug therapy: Secondary | ICD-10-CM | POA: Diagnosis not present

## 2023-04-23 DIAGNOSIS — X58XXXA Exposure to other specified factors, initial encounter: Secondary | ICD-10-CM | POA: Diagnosis present

## 2023-04-23 DIAGNOSIS — Z8719 Personal history of other diseases of the digestive system: Secondary | ICD-10-CM

## 2023-04-23 DIAGNOSIS — I10 Essential (primary) hypertension: Secondary | ICD-10-CM | POA: Diagnosis present

## 2023-04-23 DIAGNOSIS — Z86010 Personal history of colonic polyps: Secondary | ICD-10-CM

## 2023-04-23 DIAGNOSIS — Z87891 Personal history of nicotine dependence: Secondary | ICD-10-CM

## 2023-04-23 DIAGNOSIS — Z83438 Family history of other disorder of lipoprotein metabolism and other lipidemia: Secondary | ICD-10-CM

## 2023-04-23 DIAGNOSIS — Z9889 Other specified postprocedural states: Secondary | ICD-10-CM

## 2023-04-23 DIAGNOSIS — Z823 Family history of stroke: Secondary | ICD-10-CM

## 2023-04-23 DIAGNOSIS — G8918 Other acute postprocedural pain: Secondary | ICD-10-CM | POA: Diagnosis not present

## 2023-04-23 DIAGNOSIS — M810 Age-related osteoporosis without current pathological fracture: Secondary | ICD-10-CM | POA: Diagnosis present

## 2023-04-23 DIAGNOSIS — K219 Gastro-esophageal reflux disease without esophagitis: Secondary | ICD-10-CM | POA: Diagnosis present

## 2023-04-23 DIAGNOSIS — S82002A Unspecified fracture of left patella, initial encounter for closed fracture: Secondary | ICD-10-CM

## 2023-04-23 DIAGNOSIS — Z85828 Personal history of other malignant neoplasm of skin: Secondary | ICD-10-CM | POA: Diagnosis not present

## 2023-04-23 DIAGNOSIS — E785 Hyperlipidemia, unspecified: Secondary | ICD-10-CM | POA: Diagnosis present

## 2023-04-23 DIAGNOSIS — E739 Lactose intolerance, unspecified: Secondary | ICD-10-CM | POA: Diagnosis present

## 2023-04-23 DIAGNOSIS — S82032A Displaced transverse fracture of left patella, initial encounter for closed fracture: Principal | ICD-10-CM

## 2023-04-23 HISTORY — PX: ORIF PATELLA: SHX5033

## 2023-04-23 SURGERY — OPEN REDUCTION INTERNAL FIXATION (ORIF) PATELLA
Anesthesia: General | Site: Knee | Laterality: Left

## 2023-04-23 MED ORDER — SODIUM CHLORIDE 0.9 % IV SOLN: INTRAVENOUS | Status: DC

## 2023-04-23 MED ORDER — PROPOFOL 10 MG/ML IV BOLUS
INTRAVENOUS | Status: AC
  Filled 2023-04-23: qty 20

## 2023-04-23 MED ORDER — LIDOCAINE 2% (20 MG/ML) 5 ML SYRINGE
INTRAMUSCULAR | Status: DC | PRN
  Administered 2023-04-23: 60 mg via INTRAVENOUS

## 2023-04-23 MED ORDER — ORAL CARE MOUTH RINSE: 15.0000 mL | Freq: Once | OROMUCOSAL | Status: AC

## 2023-04-23 MED ORDER — ACETAMINOPHEN 325 MG PO TABS
325.0000 mg | ORAL_TABLET | Freq: Four times a day (QID) | ORAL | Status: DC | PRN
  Administered 2023-04-25: 650 mg via ORAL

## 2023-04-23 MED ORDER — CLONIDINE HCL (ANALGESIA) 100 MCG/ML EP SOLN
EPIDURAL | Status: DC | PRN
  Administered 2023-04-23: 100 ug

## 2023-04-23 MED ORDER — METHOCARBAMOL 500 MG PO TABS
500.0000 mg | ORAL_TABLET | Freq: Four times a day (QID) | ORAL | Status: DC | PRN
  Administered 2023-04-25: 500 mg via ORAL

## 2023-04-23 MED ORDER — ONDANSETRON HCL 4 MG/2ML IJ SOLN
INTRAMUSCULAR | Status: AC
  Filled 2023-04-23: qty 2

## 2023-04-23 MED ORDER — ONDANSETRON HCL 4 MG/2ML IJ SOLN: 4.0000 mg | Freq: Once | INTRAMUSCULAR | Status: DC | PRN

## 2023-04-23 MED ORDER — DIPHENHYDRAMINE HCL 12.5 MG/5ML PO ELIX: 25.0000 mg | ORAL_SOLUTION | ORAL | Status: DC | PRN

## 2023-04-23 MED ORDER — ROPIVACAINE HCL 5 MG/ML IJ SOLN
INTRAMUSCULAR | Status: DC | PRN
  Administered 2023-04-23: 30 mL via PERINEURAL

## 2023-04-23 MED ORDER — CEFAZOLIN SODIUM-DEXTROSE 2-4 GM/100ML-% IV SOLN
2.0000 g | INTRAVENOUS | Status: AC
  Administered 2023-04-23: 2 g via INTRAVENOUS
  Filled 2023-04-23: qty 100

## 2023-04-23 MED ORDER — HYDROCHLOROTHIAZIDE 12.5 MG PO TABS
12.5000 mg | ORAL_TABLET | Freq: Every day | ORAL | Status: DC
  Administered 2023-04-23 – 2023-04-29 (×7): 12.5 mg via ORAL
  Filled 2023-04-23 (×7): qty 1

## 2023-04-23 MED ORDER — BUPIVACAINE HCL (PF) 0.25 % IJ SOLN
INTRAMUSCULAR | Status: AC
  Filled 2023-04-23: qty 30

## 2023-04-23 MED ORDER — BUPIVACAINE HCL (PF) 0.25 % IJ SOLN
INTRAMUSCULAR | Status: DC | PRN
  Administered 2023-04-23: 10 mL

## 2023-04-23 MED ORDER — ONDANSETRON HCL 4 MG/2ML IJ SOLN: 4.0000 mg | Freq: Four times a day (QID) | INTRAMUSCULAR | Status: DC | PRN

## 2023-04-23 MED ORDER — PROPOFOL 10 MG/ML IV BOLUS
INTRAVENOUS | Status: DC | PRN
  Administered 2023-04-23: 200 mg via INTRAVENOUS

## 2023-04-23 MED ORDER — METHOCARBAMOL 1000 MG/10ML IJ SOLN: 500.0000 mg | Freq: Four times a day (QID) | INTRAVENOUS | Status: DC | PRN

## 2023-04-23 MED ORDER — POLYETHYLENE GLYCOL 3350 17 G PO PACK: 17.0000 g | PACK | Freq: Every day | ORAL | Status: DC | PRN

## 2023-04-23 MED ORDER — LACTATED RINGERS IV SOLN: INTRAVENOUS | Status: DC

## 2023-04-23 MED ORDER — FENTANYL CITRATE (PF) 100 MCG/2ML IJ SOLN
100.0000 ug | Freq: Once | INTRAMUSCULAR | Status: AC
  Filled 2023-04-23: qty 2

## 2023-04-23 MED ORDER — FENTANYL CITRATE (PF) 250 MCG/5ML IJ SOLN
INTRAMUSCULAR | Status: AC
  Filled 2023-04-23: qty 5

## 2023-04-23 MED ORDER — ACETAMINOPHEN 10 MG/ML IV SOLN
INTRAVENOUS | Status: DC | PRN
  Administered 2023-04-23: 1000 mg via INTRAVENOUS

## 2023-04-23 MED ORDER — LISINOPRIL 20 MG PO TABS
20.0000 mg | ORAL_TABLET | Freq: Every day | ORAL | Status: DC
  Administered 2023-04-23 – 2023-04-29 (×7): 20 mg via ORAL
  Filled 2023-04-23 (×7): qty 1

## 2023-04-23 MED ORDER — HYDROMORPHONE HCL 1 MG/ML IJ SOLN
0.5000 mg | INTRAMUSCULAR | Status: DC | PRN
  Administered 2023-04-23: 1 mg via INTRAVENOUS
  Administered 2023-04-23: 0.5 mg via INTRAVENOUS
  Administered 2023-04-24: 1 mg via INTRAVENOUS
  Filled 2023-04-23 (×3): qty 1

## 2023-04-23 MED ORDER — ONDANSETRON HCL 4 MG PO TABS: 4.0000 mg | ORAL_TABLET | Freq: Four times a day (QID) | ORAL | Status: DC | PRN

## 2023-04-23 MED ORDER — 0.9 % SODIUM CHLORIDE (POUR BTL) OPTIME
TOPICAL | Status: DC | PRN
  Administered 2023-04-23: 1000 mL

## 2023-04-23 MED ORDER — FENTANYL CITRATE (PF) 100 MCG/2ML IJ SOLN: 25.0000 ug | INTRAMUSCULAR | Status: DC | PRN

## 2023-04-23 MED ORDER — OXYCODONE HCL 5 MG PO TABS: 10.0000 mg | ORAL_TABLET | ORAL | Status: DC | PRN

## 2023-04-23 MED ORDER — DOCUSATE SODIUM 100 MG PO CAPS
100.0000 mg | ORAL_CAPSULE | Freq: Two times a day (BID) | ORAL | Status: DC
  Administered 2023-04-23 – 2023-04-29 (×7): 100 mg via ORAL
  Filled 2023-04-23 (×9): qty 1

## 2023-04-23 MED ORDER — METOCLOPRAMIDE HCL 5 MG PO TABS: 5.0000 mg | ORAL_TABLET | Freq: Three times a day (TID) | ORAL | Status: DC | PRN

## 2023-04-23 MED ORDER — OXYCODONE HCL 5 MG PO TABS: 5.0000 mg | ORAL_TABLET | ORAL | Status: DC | PRN

## 2023-04-23 MED ORDER — ACETAMINOPHEN 500 MG PO TABS
1000.0000 mg | ORAL_TABLET | Freq: Four times a day (QID) | ORAL | Status: AC
  Administered 2023-04-23 – 2023-04-24 (×4): 1000 mg via ORAL
  Filled 2023-04-23 (×2): qty 2

## 2023-04-23 MED ORDER — TRANEXAMIC ACID-NACL 1000-0.7 MG/100ML-% IV SOLN
1000.0000 mg | Freq: Once | INTRAVENOUS | Status: AC
  Administered 2023-04-23: 1000 mg via INTRAVENOUS
  Filled 2023-04-23: qty 100

## 2023-04-23 MED ORDER — MIDAZOLAM HCL 2 MG/2ML IJ SOLN
INTRAMUSCULAR | Status: AC
  Filled 2023-04-23: qty 2

## 2023-04-23 MED ORDER — SORBITOL 70 % SOLN: 30.0000 mL | Freq: Every day | Status: DC | PRN

## 2023-04-23 MED ORDER — MAGNESIUM CITRATE PO SOLN: 1.0000 | Freq: Once | ORAL | Status: DC | PRN

## 2023-04-23 MED ORDER — ACETAMINOPHEN 10 MG/ML IV SOLN
INTRAVENOUS | Status: AC
  Filled 2023-04-23: qty 100

## 2023-04-23 MED ORDER — CEFAZOLIN SODIUM-DEXTROSE 2-4 GM/100ML-% IV SOLN
2.0000 g | Freq: Four times a day (QID) | INTRAVENOUS | Status: AC
  Administered 2023-04-23 – 2023-04-24 (×3): 2 g via INTRAVENOUS
  Filled 2023-04-23 (×2): qty 100

## 2023-04-23 MED ORDER — FENTANYL CITRATE (PF) 250 MCG/5ML IJ SOLN
INTRAMUSCULAR | Status: DC | PRN
  Administered 2023-04-23 (×3): 25 ug via INTRAVENOUS

## 2023-04-23 MED ORDER — FENTANYL CITRATE (PF) 100 MCG/2ML IJ SOLN
INTRAMUSCULAR | Status: AC
  Administered 2023-04-23: 50 ug via INTRAVENOUS
  Filled 2023-04-23: qty 2

## 2023-04-23 MED ORDER — CHLORHEXIDINE GLUCONATE 0.12 % MT SOLN
15.0000 mL | Freq: Once | OROMUCOSAL | Status: AC
  Administered 2023-04-23: 15 mL via OROMUCOSAL
  Filled 2023-04-23: qty 15

## 2023-04-23 MED ORDER — METOCLOPRAMIDE HCL 5 MG/ML IJ SOLN: 5.0000 mg | Freq: Three times a day (TID) | INTRAMUSCULAR | Status: DC | PRN

## 2023-04-23 MED ORDER — ACETAMINOPHEN 10 MG/ML IV SOLN: 1000.0000 mg | Freq: Once | INTRAVENOUS | Status: DC | PRN

## 2023-04-23 MED ORDER — ONDANSETRON HCL 4 MG/2ML IJ SOLN
INTRAMUSCULAR | Status: DC | PRN
  Administered 2023-04-23: 4 mg via INTRAVENOUS

## 2023-04-23 SURGICAL SUPPLY — 77 items
BAG COUNTER SPONGE SURGICOUNT (BAG) ×1 IMPLANT
BAG SPNG CNTER NS LX DISP (BAG) ×1
BIT DRILL QC 2X140 (BIT) IMPLANT
BNDG ADH 5X4 AIR PERM ELC (GAUZE/BANDAGES/DRESSINGS) ×1
BNDG CMPR 5X6 CHSV STRCH STRL (GAUZE/BANDAGES/DRESSINGS) ×1
BNDG CMPR MED 10X6 ELC LF (GAUZE/BANDAGES/DRESSINGS) ×1
BNDG COHESIVE 4X5 TAN STRL (GAUZE/BANDAGES/DRESSINGS) IMPLANT
BNDG COHESIVE 4X5 WHT NS (GAUZE/BANDAGES/DRESSINGS) IMPLANT
BNDG COHESIVE 6X5 TAN ST LF (GAUZE/BANDAGES/DRESSINGS) ×1 IMPLANT
BNDG ELASTIC 4X5.8 VLCR STR LF (GAUZE/BANDAGES/DRESSINGS) IMPLANT
BNDG ELASTIC 6X10 VLCR STRL LF (GAUZE/BANDAGES/DRESSINGS) IMPLANT
BNDG ELASTIC 6X5.8 VLCR STR LF (GAUZE/BANDAGES/DRESSINGS) IMPLANT
COVER SURGICAL LIGHT HANDLE (MISCELLANEOUS) ×1 IMPLANT
CUFF TOURN SGL QUICK 34 (TOURNIQUET CUFF)
CUFF TOURN SGL QUICK 42 (TOURNIQUET CUFF) IMPLANT
CUFF TRNQT CYL 34X4.125X (TOURNIQUET CUFF) IMPLANT
DRAPE C-ARM 42X72 X-RAY (DRAPES) IMPLANT
DRAPE C-ARMOR (DRAPES) IMPLANT
DRAPE IMP U-DRAPE 54X76 (DRAPES) ×1 IMPLANT
DRAPE INCISE IOBAN 66X45 STRL (DRAPES) ×1 IMPLANT
DRAPE U-SHAPE 47X51 STRL (DRAPES) IMPLANT
DRSG MEPITEL 3X4 ME34 (GAUZE/BANDAGES/DRESSINGS) IMPLANT
DRSG XEROFORM 1X8 (GAUZE/BANDAGES/DRESSINGS) IMPLANT
DURAPREP 26ML APPLICATOR (WOUND CARE) ×1 IMPLANT
ELECT CAUTERY BLADE 6.4 (BLADE) ×1 IMPLANT
ELECT REM PT RETURN 9FT ADLT (ELECTROSURGICAL) ×1
ELECTRODE REM PT RTRN 9FT ADLT (ELECTROSURGICAL) ×1 IMPLANT
GAUZE PAD ABD 8X10 STRL (GAUZE/BANDAGES/DRESSINGS) IMPLANT
GAUZE SPONGE 4X4 12PLY STRL (GAUZE/BANDAGES/DRESSINGS) ×1 IMPLANT
GAUZE XEROFORM 5X9 LF (GAUZE/BANDAGES/DRESSINGS) IMPLANT
GLOVE BIOGEL PI IND STRL 7.0 (GLOVE) ×2 IMPLANT
GLOVE BIOGEL PI IND STRL 7.5 (GLOVE) ×1 IMPLANT
GLOVE ECLIPSE 7.0 STRL STRAW (GLOVE) ×1 IMPLANT
GLOVE SKINSENSE STRL SZ7.5 (GLOVE) ×2 IMPLANT
GLOVE SURG SYN 7.5  E (GLOVE) ×2
GLOVE SURG SYN 7.5 E (GLOVE) ×2 IMPLANT
GLOVE SURG SYN 7.5 PF PI (GLOVE) ×2 IMPLANT
GLOVE SURG UNDER POLY LF SZ7 (GLOVE) ×19 IMPLANT
GLOVE SURG UNDER POLY LF SZ7.5 (GLOVE) ×4 IMPLANT
GOWN STRL SURGICAL XL XLNG (GOWN DISPOSABLE) ×3 IMPLANT
GUIDEWIRE TT 3.5/4.0 1.4X150 (WIRE) IMPLANT
KIT BASIN OR (CUSTOM PROCEDURE TRAY) ×1 IMPLANT
KIT TEMPLATE PATELLA ANTERIOR (ORTHOPEDIC DISPOSABLE SUPPLIES) IMPLANT
KIT TURNOVER KIT B (KITS) ×1 IMPLANT
NDL HYPO 25GX1X1/2 BEV (NEEDLE) IMPLANT
NEEDLE HYPO 25GX1X1/2 BEV (NEEDLE) IMPLANT
NS IRRIG 1000ML POUR BTL (IV SOLUTION) ×1 IMPLANT
PACK ORTHO EXTREMITY (CUSTOM PROCEDURE TRAY) ×1 IMPLANT
PAD ARMBOARD 7.5X6 YLW CONV (MISCELLANEOUS) ×2 IMPLANT
PAD CAST 3X4 CTTN HI CHSV (CAST SUPPLIES) IMPLANT
PADDING CAST COTTON 3X4 STRL (CAST SUPPLIES)
PADDING CAST COTTON 6X4 STRL (CAST SUPPLIES) IMPLANT
PASSER SUT SWANSON 36MM LOOP (INSTRUMENTS) IMPLANT
PLATE LOCK SM 3H ANT PAT 2.7 (Plate) IMPLANT
SCREW LOCK VA ST 2.7X18 (Screw) IMPLANT
SCREW LOCK VA ST 2.7X20 (Screw) IMPLANT
SCREW LOCKING 2.7X16MM VA (Screw) IMPLANT
SCREW METAPHYSEAL 2.7X20MM (Screw) IMPLANT
SPONGE T-LAP 18X18 ~~LOC~~+RFID (SPONGE) ×1 IMPLANT
STOCKINETTE IMPERVIOUS LG (DRAPES) ×1 IMPLANT
SUCTION FRAZIER HANDLE 10FR (MISCELLANEOUS) ×1
SUCTION TUBE FRAZIER 10FR DISP (MISCELLANEOUS) ×1 IMPLANT
SUT ETHILON 2 0 FS 18 (SUTURE) IMPLANT
SUT ETHILON 2 0 PSLX (SUTURE) IMPLANT
SUT ETHILON 3 0 PS 1 (SUTURE) IMPLANT
SUT FIBERWIRE #2 38 T-5 BLUE (SUTURE)
SUT VIC AB 0 CT1 27 (SUTURE) ×1
SUT VIC AB 0 CT1 27XBRD ANBCTR (SUTURE) IMPLANT
SUT VIC AB 2-0 CT1 (SUTURE) IMPLANT
SUT VIC AB 2-0 CT1 27 (SUTURE) ×1
SUT VIC AB 2-0 CT1 TAPERPNT 27 (SUTURE) IMPLANT
SUTURE FIBERWR #2 38 T-5 BLUE (SUTURE) IMPLANT
SYR CONTROL 10ML LL (SYRINGE) IMPLANT
TOWEL GREEN STERILE (TOWEL DISPOSABLE) ×2 IMPLANT
TOWEL GREEN STERILE FF (TOWEL DISPOSABLE) ×1 IMPLANT
TUBE CONNECTING 12X1/4 (SUCTIONS) ×1 IMPLANT
WIRE OLIVE COMPRESSION 1.6X10 (WIRE) IMPLANT

## 2023-04-23 NOTE — Transfer of Care (Signed)
Immediate Anesthesia Transfer of Care Note  Patient: Cindy Velasquez  Procedure(s) Performed: OPEN REDUCTION INTERNAL FIXATION (ORIF) LEFT PATELLA (Left: Knee)  Patient Location: PACU  Anesthesia Type:GA combined with regional for post-op pain  Level of Consciousness: awake, alert , and oriented  Airway & Oxygen Therapy: Patient Spontanous Breathing and Patient connected to nasal cannula oxygen  Post-op Assessment: Report given to RN, Post -op Vital signs reviewed and stable, Patient moving all extremities X 4, and Patient able to stick tongue midline  Post vital signs: Reviewed  Last Vitals:  Vitals Value Taken Time  BP 156/70   Temp 97.9   Pulse 78 04/23/23 1613  Resp 12 04/23/23 1613  SpO2 93 % 04/23/23 1613  Vitals shown include unvalidated device data.  Last Pain:  Vitals:   04/23/23 1117  TempSrc:   PainSc: 2          Complications:  Encounter Notable Events  Notable Event Outcome Phase Comment  Difficult to intubate - expected  Intraprocedure Filed from anesthesia note documentation.

## 2023-04-23 NOTE — Anesthesia Procedure Notes (Signed)
Procedure Name: LMA Insertion Date/Time: 04/23/2023 2:32 PM  Performed by: Cy Blamer, CRNAPre-anesthesia Checklist: Patient identified, Emergency Drugs available, Suction available, Patient being monitored and Timeout performed Patient Re-evaluated:Patient Re-evaluated prior to induction Oxygen Delivery Method: Circle system utilized Preoxygenation: Pre-oxygenation with 100% oxygen Induction Type: IV induction LMA: LMA inserted LMA Size: 3.0 Number of attempts: 1 Placement Confirmation: positive ETCO2 Tube secured with: Tape Dental Injury: Teeth and Oropharynx as per pre-operative assessment  Difficulty Due To: Difficulty was anticipated, Difficult Airway- due to limited oral opening and Difficult Airway- due to reduced neck mobility Comments: Anticipated slight difficulty placing LMA on 1st attempt

## 2023-04-23 NOTE — Plan of Care (Signed)

## 2023-04-23 NOTE — Anesthesia Procedure Notes (Signed)
Anesthesia Regional Block: Adductor canal block   Pre-Anesthetic Checklist: , timeout performed,  Correct Patient, Correct Site, Correct Laterality,  Correct Procedure, Correct Position, site marked,  Risks and benefits discussed,  Surgical consent,  Pre-op evaluation,  At surgeon's request and post-op pain management  Laterality: Lower and Left  Prep: chloraprep       Needles:  Injection technique: Single-shot  Needle Type: Echogenic Needle     Needle Length: 9cm  Needle Gauge: 22     Additional Needles:   Procedures:,,,, ultrasound used (permanent image in chart),,    Narrative:  Start time: 04/23/2023 12:32 PM End time: 04/23/2023 12:38 PM Injection made incrementally with aspirations every 5 mL.  Performed by: Personally  Anesthesiologist: Trevor Iha, MD  Additional Notes: Block assessed prior to surgery. Pt tolerated procedure well.

## 2023-04-23 NOTE — Discharge Instructions (Signed)
   Postoperative instructions:  Weightbearing instructions: as tolerated  Dressing instructions: Keep your dressing and/or splint clean and dry at all times.  It will be removed at your first post-operative appointment.  Your stitches and/or staples will be removed at this visit.  Incision instructions:  Do not soak your incision for 3 weeks after surgery.  If the incision gets wet, pat dry and do not scrub the incision.  Pain control:  You have been given a prescription to be taken as directed for post-operative pain control.  In addition, elevate the operative extremity above the heart at all times to prevent swelling and throbbing pain.  Take over-the-counter Colace, 100mg  by mouth twice a day while taking narcotic pain medications to help prevent constipation.  Follow up appointments: 1) 14 days for suture removal and wound check.  -------------------------------------------------------------------------------------------------------------  After Surgery Pain Control:  After your surgery, post-surgical discomfort or pain is likely. This discomfort can last several days to a few weeks. At certain times of the day your discomfort may be more intense.  Did you receive a nerve block?  A nerve block can provide pain relief for one hour to two days after your surgery. As long as the nerve block is working, you will experience little or no sensation in the area the surgeon operated on.  As the nerve block wears off, you will begin to experience pain or discomfort. It is very important that you begin taking your prescribed pain medication before the nerve block fully wears off. Treating your pain at the first sign of the block wearing off will ensure your pain is better controlled and more tolerable when full-sensation returns. Do not wait until the pain is intolerable, as the medicine will be less effective. It is better to treat pain in advance than to try and catch up.  General Anesthesia:  If  you did not receive a nerve block during your surgery, you will need to start taking your pain medication shortly after your surgery and should continue to do so as prescribed by your surgeon.  Pain Medication:  Most commonly we prescribe Vicodin and Percocet for post-operative pain. Both of these medications contain a combination of acetaminophen (Tylenol) and a narcotic to help control pain.   It takes between 30 and 45 minutes before pain medication starts to work. It is important to take your medication before your pain level gets too intense.   Nausea is a common side effect of many pain medications. You will want to eat something before taking your pain medicine to help prevent nausea.   If you are taking a prescription pain medication that contains acetaminophen, we recommend that you do not take additional over the counter acetaminophen (Tylenol).  Other pain relieving options:   Using a cold pack to ice the affected area a few times a day (15 to 20 minutes at a time) can help to relieve pain, reduce swelling and bruising.   Elevation of the affected area can also help to reduce pain and swelling.

## 2023-04-23 NOTE — Anesthesia Postprocedure Evaluation (Signed)
Anesthesia Post Note  Patient: Cindy Velasquez  Procedure(s) Performed: OPEN REDUCTION INTERNAL FIXATION (ORIF) LEFT PATELLA (Left: Knee)     Patient location during evaluation: PACU Anesthesia Type: General Level of consciousness: awake and alert Pain management: pain level controlled Vital Signs Assessment: post-procedure vital signs reviewed and stable Respiratory status: spontaneous breathing, nonlabored ventilation, respiratory function stable and patient connected to nasal cannula oxygen Cardiovascular status: blood pressure returned to baseline and stable Postop Assessment: no apparent nausea or vomiting Anesthetic complications: yes   Encounter Notable Events  Notable Event Outcome Phase Comment  Difficult to intubate - expected  Intraprocedure Filed from anesthesia note documentation.    Last Vitals:  Vitals:   04/23/23 1615 04/23/23 1630  BP: (!) 155/62 138/60  Pulse: 69 61  Resp: (!) 8 (!) 9  Temp:    SpO2: 96% 97%    Last Pain:  Vitals:   04/23/23 1612  TempSrc:   PainSc: 0-No pain                 Collene Schlichter

## 2023-04-23 NOTE — H&P (Signed)
PREOPERATIVE H&P  Chief Complaint: left patella fracture  HPI: Cindy Velasquez is a 82 y.o. female who presents for surgical treatment of left patella fracture.  She denies any changes in medical history.  Past Medical History:  Diagnosis Date   Adenomatous colon polyp    Cancer (HCC) 03/2015   BCC R tibia    Difficult intubation    Diverticulosis of colon    GERD (gastroesophageal reflux disease)    Hip pain    Hyperlipidemia    Hyperplastic colon polyp    Hypertension    Lactose intolerance    Loose stools    Lower back pain    Osteoporosis    Palpitations    Past Surgical History:  Procedure Laterality Date   ANTERIOR CERVICAL DECOMP/DISCECTOMY FUSION N/A 09/11/2022   Procedure: CERVICAL FIVE-SIX ANTERIOR CERVICAL DECOMPRESSION/DISCECTOMY FUSION;  Surgeon: Bedelia Person, MD;  Location: [facility];  Service: Neurosurgery;  Laterality: N/A;   BACK SURGERY     EYE SURGERY  12/08/2010   B/L 01/2011  02/2011   HAMMER TOE SURGERY     Bilateral   ROTATOR CUFF REPAIR     Left   TUBAL LIGATION     Social History   Socioeconomic History   Marital status: Married    Spouse name: Latea Lockard   Number of children: 4   Years of education: Not on file   Highest education level: Not on file  Occupational History   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  Other Topics Concern   Not on file  Social History Narrative   Exercise--  [employer]--yoga and cardio 3x a week      Right Handed     Lives in a one story home    Social Determinants of Health   Financial Resource Strain: Low Risk  (01/28/2022)   Overall Financial Resource Strain (CARDIA)    Difficulty of Paying Living Expenses: Not hard at all  Food Insecurity: No Food Insecurity (01/28/2022)   Hunger Vital Sign    Worried About Running Out of Food in the Last Year: Never true    Ran Out of Food in the Last Year: Never true  Transportation Needs: No Transportation Needs (01/28/2022)   PRAPARE - Administrator, Civil Service (Medical): No    Lack of Transportation (Non-Medical): No  Physical Activity: Sufficiently Active (01/28/2022)   Exercise Vital Sign    Days of Exercise per Week: 3 days    Minutes of Exercise per Session: 60 min  Stress: No Stress Concern Present (01/28/2022)   [employer] of Occupational Health - Occupational Stress Questionnaire    Feeling of Stress : Not at all  Social Connections: Moderately Integrated (01/28/2022)   Social Connection and Isolation Panel [NHANES]    Frequency of Communication with Friends and Family: More than three times a week  Frequency of Social Gatherings with Friends and Family: More than three times a week    Attends Religious Services: Never    Database administrator or Organizations: Yes    Attends Engineer, structural: More than 4 times per year    Marital Status: Married   Family History  Problem Relation Age of Onset   Hypertension Mother    Cancer Mother 52       ?   Hyperlipidemia Mother    Obesity Mother    Hypertension Father    Stroke Father    Heart disease Father    Hypertension Brother    Testicular cancer Brother 102   Hypertension Brother    Heart disease Brother        quadruple bypass   Colon cancer Neg Hx    Stomach cancer Neg Hx    Throat cancer Neg Hx    Allergies  Allergen Reactions   Lactose Intolerance (Gi)     Upset stomach/ bloating    Prior to Admission medications   Medication Sig Start Date  End Date Taking? Authorizing Provider  acetaminophen (TYLENOL) 500 MG tablet Take 500 mg by mouth every 3 (three) hours as needed for moderate pain.   Yes [provider]  Cholecalciferol (VITAMIN D) 50 MCG (2000 UT) tablet Take 2,000 Units by mouth daily.   Yes [provider]  clotrimazole-betamethasone (LOTRISONE) cream Apply 1 application topically 2 (two) times daily. Use as needed for rash Patient taking differently: Apply 1 application  topically 2 (two) times daily as needed (rash). 12/11/20  Yes Seabron Spates R, DO  escitalopram (LEXAPRO) 10 MG tablet TAKE 1 TABLET(10 MG) BY MOUTH DAILY Patient taking differently: Take 10 mg by mouth at bedtime. 04/06/23  Yes Seabron Spates R, DO  fluticasone (FLONASE) 50 MCG/ACT nasal spray Place 2 sprays into both nostrils daily as needed for allergies.   Yes [provider]  hydrochlorothiazide (HYDRODIURIL) 25 MG tablet TAKE 1/2 TABLET(12.5 MG) BY MOUTH EVERY MORNING 11/10/22  Yes Lowne Chase, Yvonne R, DO  Lactobacillus-Inulin (CULTURELLE DIGESTIVE DAILY) CAPS Take 1 capsule by mouth daily.   Yes [provider]  lisinopril (ZESTRIL) 20 MG tablet TAKE 1 TABLET BY MOUTH DAILY 03/23/23  Yes Donato Schultz, DO  Multiple Vitamins-Minerals (ICAPS AREDS 2) CAPS Take 1 capsule by mouth in the morning and at bedtime.   Yes [provider]  Olopatadine HCl (PATADAY) 0.2 % SOLN 1 gtt each eye qd Patient taking differently: Place 1 drop into both eyes daily. 05/23/22  Yes Seabron Spates R, DO  omeprazole (PRILOSEC) 20 MG capsule Take 1 capsule (20 mg total) by mouth daily. 01/27/23  Yes Seabron Spates R, DO  rosuvastatin (CRESTOR) 20 MG tablet TAKE 1 TABLET(20 MG) BY MOUTH DAILY 02/02/23  Yes Zola Button, Yvonne R, DO  Wheat Dextrin (BENEFIBER) POWD Take 1 Dose by mouth daily. 1 dose = 2 teaspoons   Yes [provider]  ibuprofen (ADVIL) 200 MG tablet Take 200 mg by mouth every 3 (three)  hours as needed for moderate pain.    [provider]  ondansetron (ZOFRAN-ODT) 4 MG disintegrating tablet Take 1 tablet (4 mg total) by mouth every 8 (eight) hours as needed for nausea or vomiting. Patient not taking: Reported on 04/22/2023 04/17/23   Benjiman Core, MD  OVER THE COUNTER MEDICATION Apply 1 Application topically daily as needed (pain). THC pain relieving cream    [provider]  oxyCODONE-acetaminophen (PERCOCET/ROXICET) 5-325 MG tablet Take 1-2 tablets by mouth every 8 (eight) hours as needed for severe pain. Patient not taking: Reported on 04/22/2023 04/17/23   Benjiman Core, MD     Positive ROS: All other systems have been reviewed and were otherwise negative with the exception of those mentioned in the HPI and as above.  Physical Exam: General: Alert, no acute distress Cardiovascular: No pedal edema Respiratory: No cyanosis, no use of accessory musculature GI: abdomen soft Skin: No lesions in the area of chief complaint Neurologic: Sensation intact distally Psychiatric: Patient is competent for consent with normal mood and affect Lymphatic: no lymphedema  MUSCULOSKELETAL: exam stable  Assessment: left patella fracture  Plan: Plan for Procedure(s): OPEN REDUCTION INTERNAL FIXATION (ORIF) LEFT PATELLA  The risks benefits and alternatives were discussed with the patient including but not limited to the risks of nonoperative treatment, versus surgical intervention including infection, bleeding, nerve injury,  blood clots, cardiopulmonary complications, morbidity, mortality, among others, and they were willing to proceed.   Glee Arvin, MD 04/23/2023 12:16 PM

## 2023-04-23 NOTE — Op Note (Addendum)
Date of Surgery: 04/23/2023  INDICATIONS: Ms. Coble is a 82 y.o.-year-old female with a left patella fracture.  The patient did consent to the procedure after discussion of the risks and benefits.  PREOPERATIVE DIAGNOSIS: Left patella fracture  POSTOPERATIVE DIAGNOSIS: Same.  PROCEDURE: Open reduction internal fixation of left patella fracture  SURGEON: N. Glee Arvin, M.D.  ASSIST: Starlyn Skeans [place], [place]; necessary for the timely completion of procedure and due to complexity of procedure.  ANESTHESIA:  general  IV FLUIDS AND URINE: See anesthesia.  ESTIMATED BLOOD LOSS: minimal mL.  IMPLANTS: Synthes 2.7 mm variable locking anterior patella plate Implant Name Type Inv. Item Serial No. Manufacturer Lot No. LRB No. Used Action  PLATE LOCK SM 3H ANT PAT 2.7 - [id number] Plate PLATE LOCK SM 3H ANT PAT 2.7  [employer] [id number] Left 1 Implanted  SCREW LOCKING 2.7X16MM VA - [id number] Screw SCREW LOCKING 2.7X16MM VA  [employer]  Left 3 Implanted  SCREW LOCK VA ST 2.7X18 - [id number] Screw SCREW LOCK VA ST 2.7X18  [employer]  Left 2 Implanted  SCREW LOCK VA ST 2.7X20 - [id number] Screw SCREW LOCK VA ST 2.7X20  [employer]  Left 4 Implanted  SCREW METAPHYSEAL 2.7X20MM - [id number] Screw SCREW METAPHYSEAL 2.7X20MM  [employer]  Left 1 Implanted    DRAINS: None  COMPLICATIONS: see description of procedure.  DESCRIPTION OF PROCEDURE: The patient was brought to the operating room.  The patient had been signed prior to the procedure and this was documented. The patient had the anesthesia placed by the anesthesiologist.  A time-out was performed to confirm that this was the correct patient, site, side and location. The patient did receive antibiotics prior to the incision and was re-dosed during the procedure as needed at indicated intervals.  A tourniquet was placed.  The patient had the operative extremity prepped and draped in the standard surgical  fashion.    A longitudinal incision was made directly over the anterior aspect of the knee.  Full-thickness flaps were raised off of the bursa.  Traumatic hematoma was evacuated.  The paratenon was elevated off of the patella.  Organized hematoma was gently removed from the fracture surfaces.  A single transverse fracture of the patella was encountered.  Based on the size of the inferior fracture I did not feel that it was amenable to compression screw and tension band construct therefore I elected to use a locking patellar plate.  K wires were placed into each fragment for positioning and a large tenaculum clamp was used to obtain reduction under direct visualization which was then confirmed under fluoroscopy.  The K wires were removed and a template was placed and checked under fluoroscopy.  We determined that a small 3 hole plate was appropriate.  This was placed on the patella and provisionally fixed with K wires.  I placed a couple of screws into the superior fracture fragment each with excellent fixation.  Plate benders were then used to contour the plate down to the patella.  I then placed several more locking screws through the plate into the fracture fragments.  Altogether I was able to get 5 locking screws into the superior fragment and 4 locking screws into the inferior fragment.  Each screw had excellent fixation and purchase.  When the knee was gently bent to 30 degrees there was no distraction of the fracture fragments.  The surgical site was then thoroughly irrigated.  The medial and lateral retinaculum were repaired with interrupted 0 Vicryl.  The remaining surgical wound was closed in layered fashion.  Sterile dressings were applied.  Bledsoe brace was placed.  Patient tolerated the procedure well had no many complications.  Tessa Lerner was necessary for opening, closing, retracting, limb positioning and overall facilitation and timely completion of the procedure.  POSTOPERATIVE PLAN:  Patient will be admitted overnight for PT mobilization.  Anticipate she can discharge home in the morning.  Mayra Reel, MD 3:44 PM

## 2023-04-24 ENCOUNTER — Other Ambulatory Visit: Payer: Self-pay

## 2023-04-24 ENCOUNTER — Encounter (HOSPITAL_COMMUNITY): Payer: Self-pay | Admitting: Orthopaedic Surgery

## 2023-04-24 DIAGNOSIS — Z86010 Personal history of colonic polyps: Secondary | ICD-10-CM | POA: Diagnosis not present

## 2023-04-24 DIAGNOSIS — E785 Hyperlipidemia, unspecified: Secondary | ICD-10-CM | POA: Diagnosis present

## 2023-04-24 DIAGNOSIS — S82032A Displaced transverse fracture of left patella, initial encounter for closed fracture: Secondary | ICD-10-CM | POA: Diagnosis present

## 2023-04-24 DIAGNOSIS — Z8719 Personal history of other diseases of the digestive system: Secondary | ICD-10-CM | POA: Diagnosis not present

## 2023-04-24 DIAGNOSIS — X58XXXA Exposure to other specified factors, initial encounter: Secondary | ICD-10-CM | POA: Diagnosis present

## 2023-04-24 DIAGNOSIS — K219 Gastro-esophageal reflux disease without esophagitis: Secondary | ICD-10-CM | POA: Diagnosis present

## 2023-04-24 DIAGNOSIS — Z79899 Other long term (current) drug therapy: Secondary | ICD-10-CM | POA: Diagnosis not present

## 2023-04-24 DIAGNOSIS — Z83438 Family history of other disorder of lipoprotein metabolism and other lipidemia: Secondary | ICD-10-CM | POA: Diagnosis not present

## 2023-04-24 DIAGNOSIS — E739 Lactose intolerance, unspecified: Secondary | ICD-10-CM | POA: Diagnosis present

## 2023-04-24 DIAGNOSIS — I10 Essential (primary) hypertension: Secondary | ICD-10-CM | POA: Diagnosis present

## 2023-04-24 DIAGNOSIS — S82002A Unspecified fracture of left patella, initial encounter for closed fracture: Secondary | ICD-10-CM | POA: Diagnosis present

## 2023-04-24 DIAGNOSIS — Z981 Arthrodesis status: Secondary | ICD-10-CM | POA: Diagnosis not present

## 2023-04-24 DIAGNOSIS — Z85828 Personal history of other malignant neoplasm of skin: Secondary | ICD-10-CM | POA: Diagnosis not present

## 2023-04-24 DIAGNOSIS — Z8249 Family history of ischemic heart disease and other diseases of the circulatory system: Secondary | ICD-10-CM | POA: Diagnosis not present

## 2023-04-24 DIAGNOSIS — Z823 Family history of stroke: Secondary | ICD-10-CM | POA: Diagnosis not present

## 2023-04-24 DIAGNOSIS — M810 Age-related osteoporosis without current pathological fracture: Secondary | ICD-10-CM | POA: Diagnosis present

## 2023-04-24 DIAGNOSIS — Z87891 Personal history of nicotine dependence: Secondary | ICD-10-CM | POA: Diagnosis not present

## 2023-04-24 MED ORDER — OXYCODONE-ACETAMINOPHEN 5-325 MG PO TABS: 1.0000 | ORAL_TABLET | Freq: Three times a day (TID) | ORAL | 0 refills | Status: DC | PRN

## 2023-04-24 NOTE — Progress Notes (Addendum)
Inpatient Rehabilitation Admissions Coordinator   Per therapy recommendations patient was screened for CIR candidacy by Ottie Glazier RN MSN. Current payor trends with [facility] will not approve a CIR/AIR level rehab for this diagnosis. We will not pursue CIR admit.  Recommend other rehab venues to be pursued. TOC RN CM and SW made aware.  Ottie Glazier, RN, MSN Rehab Admissions Coordinator [phone] 04/24/2023 1:59 PM

## 2023-04-24 NOTE — Progress Notes (Signed)
Subjective: 1 Day Post-Op Procedure(s) (LRB): OPEN REDUCTION INTERNAL FIXATION (ORIF) LEFT PATELLA (Left) Patient reports pain as moderate.    Objective: Vital signs in last 24 hours: Temp:  [97.9 F (36.6 C)-98.4 F (36.9 C)] 98.4 F (36.9 C) (05/17 0740) Pulse Rate:  [61-83] 83 (05/17 0740) Resp:  [8-19] 16 (05/17 0740) BP: (116-171)/(47-91) 116/52 (05/17 0740) SpO2:  [92 %-98 %] 92 % (05/17 0740)  Intake/Output from previous day: 05/16 0701 - 05/17 0700 In: 700 [I.V.:700] Out: 602 [Urine:600; Blood:2] Intake/Output this shift: No intake/output data recorded.  No results for input(s): "HGB" in the last 72 hours. No results for input(s): "WBC", "RBC", "HCT", "PLT" in the last 72 hours. No results for input(s): "NA", "K", "CL", "CO2", "BUN", "CREATININE", "GLUCOSE", "CALCIUM" in the last 72 hours. No results for input(s): "LABPT", "INR" in the last 72 hours.  Neurologically intact Neurovascular intact Sensation intact distally Intact pulses distally Dorsiflexion/Plantar flexion intact Incision: dressing C/D/I No cellulitis present Compartment soft   Assessment/Plan: 1 Day Post-Op Procedure(s) (LRB): OPEN REDUCTION INTERNAL FIXATION (ORIF) LEFT PATELLA (Left) Advance diet Up with therapy D/C IV fluids Patient lives at home with husband for which she is the primary caregiver.  Not currently in a state where she is physically able to care for herself nor her husband and will need CIR vs SNF at d/c D/c dispo- CIR recommended.  Otherwise, will need SNF WBAT LLE- must wear hinged knee brace at all times     Cristie Hem 04/24/2023, 11:32 AM
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
## 2023-04-24 NOTE — Evaluation (Signed)
Occupational Therapy Evaluation Patient Details Name: Cindy Velasquez MRN: [id number] DOB: [birth year] Today's Date: 04/24/2023   History of Present Illness Patient is a 82 year old female with left patella fracture s/p ORIF of left patella.   Clinical Impression   Pt was independent and active prior to admission. Presents with post operative pain and impaired, generalized weakness and impaired standing balance. She requires set up to moderate assistance for ADL and moderate assistance for OOB with RW and hinged knee brace locked in extension. Pt has excellent potential to function modified independently with intensive rehab >3 hours a day. Will follow acutely.       Recommendations for follow up therapy are one component of a multi-disciplinary discharge planning process, led by the attending physician.  Recommendations may be updated based on patient status, additional functional criteria and insurance authorization.   Assistance Recommended at Discharge Frequent or constant Supervision/Assistance  Patient can return home with the following A lot of help with walking and/or transfers;A lot of help with bathing/dressing/bathroom;Assistance with cooking/housework;Assist for transportation;Help with stairs or ramp for entrance    Functional Status Assessment  Patient has had a recent decline in their functional status and demonstrates the ability to make significant improvements in function in a reasonable and predictable amount of time.  Equipment Recommendations       Recommendations for Other Services       Precautions / Restrictions Precautions Precautions: Fall Required Braces or Orthoses: Other Brace Other Brace: hinged knee brace locked in extension Restrictions Weight Bearing Restrictions: Yes LLE Weight Bearing: Weight bearing as tolerated      Mobility Bed Mobility Overal bed mobility: Needs Assistance Bed Mobility: Sit to Supine, Supine to Sit     Supine to sit: Min  assist Sit to supine: Min assist   General bed mobility comments: assistance for LLE support. verbal cues for technique    Transfers                          Balance     Sitting balance-Leahy Scale: Fair       Standing balance-Leahy Scale: Poor                             ADL either performed or assessed with clinical judgement   ADL Overall ADL's : Needs assistance/impaired Eating/Feeding: Independent;Bed level   Grooming: Set up;Sitting   Upper Body Bathing: Set up;Sitting   Lower Body Bathing: Moderate assistance;Sit to/from stand;Sitting/lateral leans   Upper Body Dressing : Set up;Sitting   Lower Body Dressing: Moderate assistance;Sitting/lateral leans;Sit to/from stand   Toilet Transfer: Moderate assistance;Rolling walker (2 wheels);BSC/3in1                   Vision Baseline Vision/History: 1 Wears glasses Ability to See in Adequate Light: 0 Adequate Patient Visual Report: No change from baseline       Perception     Praxis      Pertinent Vitals/Pain Pain Assessment Pain Assessment: Faces Faces Pain Scale: Hurts little more Pain Location: L knee Pain Descriptors / Indicators: Discomfort Pain Intervention(s): Premedicated before session, Ice applied     Hand Dominance Right   Extremity/Trunk Assessment Upper Extremity Assessment Upper Extremity Assessment: Overall WFL for tasks assessed   Lower Extremity Assessment Lower Extremity Assessment: Defer to PT evaluation LLE Deficits / Details: patient able to activate hip and ankle movement. hinged knee  brace locked in extension throughout session LLE Sensation: WNL   Cervical / Trunk Assessment Cervical / Trunk Assessment: Normal   Communication Communication Communication: No difficulties   Cognition Arousal/Alertness: Awake/alert Behavior During Therapy: WFL for tasks assessed/performed Overall Cognitive Status: Within Functional Limits for tasks assessed                                        General Comments  moderate assistance required for lower body dressing as patient requesting to don underwear    Exercises     Shoulder Instructions      Home Living Family/patient expects to be discharged to:: Private residence Living Arrangements: Spouse/significant other Available Help at Discharge: Family;Available PRN/intermittently Type of Home: House Home Access:  (one step up)     Home Layout: One level     Bathroom Shower/Tub: Producer, television/film/video: Standard     Home Equipment: Wheelchair - manual;Grab bars - toilet (rented)   Additional Comments: patient is the caregiver for her spouse. family lives out of town but planning to take turns assisting at home as needed with goal of going to rehab first      Prior Functioning/Environment Prior Level of Function : Independent/Modified Independent                        OT Problem List: Impaired balance (sitting and/or standing);Decreased knowledge of use of DME or AE;Decreased range of motion      OT Treatment/Interventions: Self-care/ADL training;DME and/or AE instruction;Therapeutic activities;Patient/family education;Balance training    OT Goals(Current goals can be found in the care plan section) Acute Rehab OT Goals OT Goal Formulation: With patient Time For Goal Achievement: 05/08/23 Potential to Achieve Goals: Good ADL Goals Pt Will Perform Grooming: with modified independence;standing Pt Will Perform Lower Body Bathing: with modified independence;sit to/from stand;with adaptive equipment Pt Will Perform Lower Body Dressing: with modified independence;sit to/from stand;with adaptive equipment Pt Will Transfer to Toilet: with modified independence;ambulating;bedside commode Pt Will Perform Toileting - Clothing Manipulation and hygiene: with modified independence;sit to/from stand;sitting/lateral leans Additional ADL Goal #1: Pt will  perform bed mobility independently in preparation for ADLs.  OT Frequency: Min 2X/week    Co-evaluation              AM-PAC OT "6 Clicks" Daily Activity     Outcome Measure Help from another person eating meals?: None Help from another person taking care of personal grooming?: A Little Help from another person toileting, which includes using toliet, bedpan, or urinal?: A Lot Help from another person bathing (including washing, rinsing, drying)?: A Little Help from another person to put on and taking off regular upper body clothing?: A Lot   6 Click Score: 14   End of Session Equipment Utilized During Treatment: Rolling walker (2 wheels);Left knee immobilizer  Activity Tolerance: Patient tolerated treatment well Patient left: in bed;with call bell/phone within reach;with family/visitor present  OT Visit Diagnosis: Unsteadiness on feet (R26.81);Other abnormalities of gait and mobility (R26.89);Pain                Time: 1225-1250 OT Time Calculation (min): 25 min Charges:  OT General Charges $OT Visit: 1 Visit OT Evaluation $OT Eval Moderate Complexity: 1 Mod OT Treatments $Self Care/Home Management : 8-22 mins  Berna Spare, OTR/L Acute Rehabilitation Services Office: [phone]  Evern Bio 04/24/2023, 1:37  PM

## 2023-04-24 NOTE — Evaluation (Addendum)
Physical Therapy Evaluation Patient Details Name: Cindy Velasquez MRN: [id number] DOB: 1940-12-17 Today's Date: 04/24/2023  History of Present Illness  Patient is a 82 year old female with left patella fracture s/p ORIF of left patella.  Clinical Impression  Patient is agreeable to PT evaluation. 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 She has supportive family members that plan to assist as needed at discharge. Patient and daughter are requesting acute inpatient rehab at discharge.  The patient reports 8/10 pain in the left knee with mobility today. The patient required physical assistance with bed mobility for LLE support. Cues and lifting assistance required for transfers from bed. Gait training initiated with rolling walker with maximal cues for safety and technique. The patient is not at her baseline level of functional independence and would benefit from continued PT to maximize independence and decrease caregiver burden.      Recommendations for follow up therapy are one component of a multi-disciplinary discharge planning process, led by the attending physician.  Recommendations may be updated based on patient status, additional functional criteria and insurance authorization.  Follow Up Recommendations       Assistance Recommended at Discharge Intermittent Supervision/Assistance  Patient can return home with the following  A little help with walking and/or transfers;A little help with bathing/dressing/bathroom;Help with stairs or ramp for entrance;Assist for transportation;Assistance with cooking/housework    Equipment Recommendations Rolling walker (2 wheels) (to be determined at next level of care)  Recommendations for Other Services       Functional Status Assessment Patient has had a recent decline in their functional status and demonstrates the ability to make significant improvements in function in a  reasonable and predictable amount of time.     Precautions / Restrictions Precautions Precautions: Fall Required Braces or Orthoses: Other Brace Other Brace: hinged knee brace locked in extension Restrictions Weight Bearing Restrictions: Yes LLE Weight Bearing: Weight bearing as tolerated      Mobility  Bed Mobility Overal bed mobility: Needs Assistance Bed Mobility: Sit to Supine, Supine to Sit     Supine to sit: Min assist Sit to supine: Min assist   General bed mobility comments: assistance for LLE support. verbal cues for technique    Transfers Overall transfer level: Needs assistance Equipment used: Rolling walker (2 wheels) Transfers: Sit to/from Stand Sit to Stand: Min assist           General transfer comment: lifting assistance required for standing. moderate cues for techniques with hand placement and LLE positioning.    Ambulation/Gait Ambulation/Gait assistance: Mod assist Gait Distance (Feet): 6 Feet (3 ft forward and 3 ft backwards) Assistive device: Rolling walker (2 wheels) Gait Pattern/deviations: Step-to pattern, Decreased step length - left, Decreased stance time - left     Pre-gait activities: cues to place left foot flat on the floor, weight shifting performed prior to ambulation General Gait Details: maximal cues required for techniques including rolling walker positioning, sequencing of BLE and rolling walker, increased weight acceptance on LLE. patient required steadying assistance as well as assistance for rolling walker advancement  Stairs            Wheelchair Mobility    Modified Rankin (Stroke Patients Only)       Balance Overall balance assessment: Needs assistance Sitting-balance support: No upper extremity supported Sitting balance-Leahy Scale: Fair     Standing balance support: Bilateral upper extremity supported, Reliant on assistive device for  balance Standing balance-Leahy Scale: Poor                                Pertinent Vitals/Pain Pain Assessment Pain Assessment: 0-10 Pain Score: 8  Pain Location: L knee Pain Descriptors / Indicators: Pressure Pain Intervention(s): Limited activity within patient's tolerance, Monitored during session, Repositioned    Home Living Family/patient expects to be discharged to:: Private residence Living Arrangements: Spouse/significant other Available Help at Discharge: Family;Available PRN/intermittently Type of Home: House Home Access:  (one step up)       Home Layout: One level Home Equipment: Wheelchair - manual (she is renting a wheelchair) Additional Comments: patient is the caregiver for her spouse. family lives out of town but planning to take turns assisting at home as needed with goal of going to rehab first    Prior Function Prior Level of Function : Independent/Modified Independent                     Hand Dominance        Extremity/Trunk Assessment   Upper Extremity Assessment Upper Extremity Assessment: Overall WFL for tasks assessed    Lower Extremity Assessment Lower Extremity Assessment: LLE deficits/detail LLE Deficits / Details: patient able to activate hip and ankle movement. hinged knee brace locked in extension throughout session LLE Sensation: WNL       Communication   Communication: No difficulties  Cognition Arousal/Alertness: Awake/alert Behavior During Therapy: WFL for tasks assessed/performed Overall Cognitive Status: Within Functional Limits for tasks assessed                                          General Comments General comments (skin integrity, edema, etc.): moderate assistance required for lower body dressing as patient requesting to don underwear    Exercises     Assessment/Plan    PT Assessment Patient needs continued PT services  PT Problem List Decreased strength;Decreased range of motion;Decreased activity tolerance;Decreased balance;Decreased  mobility;Decreased safety awareness;Decreased knowledge of precautions;Decreased knowledge of use of DME       PT Treatment Interventions Gait training;DME instruction;Stair training;Functional mobility training;Therapeutic activities;Therapeutic exercise;Balance training;Neuromuscular re-education;Patient/family education;Cognitive remediation    PT Goals (Current goals can be found in the Care Plan section)  Acute Rehab PT Goals Patient Stated Goal: to go to rehab in the hospital and then home PT Goal Formulation: With patient/family Time For Goal Achievement: 05/01/23 Potential to Achieve Goals: Good    Frequency Min 3X/week     Co-evaluation               AM-PAC PT "6 Clicks" Mobility  Outcome Measure Help needed turning from your back to your side while in a flat bed without using bedrails?: A Little Help needed moving from lying on your back to sitting on the side of a flat bed without using bedrails?: A Little Help needed moving to and from a bed to a chair (including a wheelchair)?: A Lot Help needed standing up from a chair using your arms (e.g., wheelchair or bedside chair)?: A Lot Help needed to walk in hospital room?: A Lot Help needed climbing 3-5 steps with a railing? : Total 6 Click Score: 13    End of Session Equipment Utilized During Treatment: Gait belt Activity Tolerance: Patient tolerated treatment well Patient left: in bed;with call  bell/phone within reach;with bed alarm set Nurse Communication: Mobility status PT Visit Diagnosis: Other abnormalities of gait and mobility (R26.89);Difficulty in walking, not elsewhere classified (R26.2)    Time: 1020-1049 PT Time Calculation (min) (ACUTE ONLY): 29 min   Charges:   PT Evaluation $PT Eval Low Complexity: 1 Low PT Treatments $Therapeutic Activity: 8-22 mins        Donna Bernard, PT, MPT   Ina Homes 04/24/2023, 11:14 AM

## 2023-04-25 NOTE — Progress Notes (Signed)
Patient ID: Cindy Velasquez, female   DOB: 1941-08-11, 82 y.o.   MRN: [id number] Patient is status post patellar reconstruction surgery.  Awaiting discharge to skilled nursing.  Patient has no complaints this morning.

## 2023-04-25 NOTE — Plan of Care (Signed)

## 2023-04-27 NOTE — Plan of Care (Signed)
  Problem: Activity: Goal: Risk for activity intolerance will decrease Outcome: Progressing   Problem: Nutrition: Goal: Adequate nutrition will be maintained Outcome: Progressing   

## 2023-04-27 NOTE — Progress Notes (Signed)
Subjective: 4 Days Post-Op Procedure(s) (LRB): OPEN REDUCTION INTERNAL FIXATION (ORIF) LEFT PATELLA (Left) Patient reports pain as mild.    Objective: Vital signs in last 24 hours: Temp:  [97.8 F (36.6 C)-98.8 F (37.1 C)] 97.8 F (36.6 C) (05/20 0920) Pulse Rate:  [80-94] 94 (05/20 0920) Resp:  [17-18] 18 (05/20 0920) BP: (145-168)/(54-78) 152/78 (05/20 0920) SpO2:  [94 %-96 %] 94 % (05/20 0920)  Intake/Output from previous day: No intake/output data recorded. Intake/Output this shift: Total I/O In: 240 [P.O.:240] Out: -   No results for input(s): "HGB" in the last 72 hours. No results for input(s): "WBC", "RBC", "HCT", "PLT" in the last 72 hours. No results for input(s): "NA", "K", "CL", "CO2", "BUN", "CREATININE", "GLUCOSE", "CALCIUM" in the last 72 hours. No results for input(s): "LABPT", "INR" in the last 72 hours.  Neurologically intact Neurovascular intact Sensation intact distally Intact pulses distally Dorsiflexion/Plantar flexion intact Incision: dressing C/D/I No cellulitis present Compartment soft   Assessment/Plan: 4 Days Post-Op Procedure(s) (LRB): OPEN REDUCTION INTERNAL FIXATION (ORIF) LEFT PATELLA (Left) Advance diet Up with therapy D/C IV fluids Discharge to SNF once insurance approves (likely tomorrow).  Patient is not safe to d/c home. WBAT LLE- hinged knee brace locked at 30 degrees of flexion to be worn at all times       Cristie Hem 04/27/2023, 2:33 PM

## 2023-04-27 NOTE — Progress Notes (Addendum)
Physical Therapy Treatment Patient Details Name: Cindy Velasquez MRN: [id number] DOB: September 04, 1941 Today's Date: 04/27/2023   History of Present Illness Patient is a 82 year old female with left patella fracture s/p ORIF of left patella.    PT Comments    Patient pleasant, received in bed. She reports the recliner is uncomfortable and would prefer to stay in bed after therapy. Patient is independent with bed mobility and transfers with mod I. Cues needed for sequencing and WB status for ambulation. Rates pain at 7/10 in left Knee. Patient ambulated 40 feet with RW and supervision/min guard. She has made great progress and will continue to benefit from skilled PT to improve independence and strength.      Recommendations for follow up therapy are one component of a multi-disciplinary discharge planning process, led by the attending physician.  Recommendations may be updated based on patient status, additional functional criteria and insurance authorization.  Follow Up Recommendations       Assistance Recommended at Discharge Intermittent Supervision/Assistance  Patient can return home with the following A little help with walking and/or transfers;A little help with bathing/dressing/bathroom;Help with stairs or ramp for entrance;Assist for transportation;Assistance with cooking/housework   Equipment Recommendations  Rolling walker (2 wheels)    Recommendations for Other Services       Precautions / Restrictions Precautions Precautions: Fall Other Brace: hinged knee brace locked in extension Restrictions Weight Bearing Restrictions: Yes LLE Weight Bearing: Weight bearing as tolerated     Mobility  Bed Mobility Overal bed mobility: Independent Bed Mobility: Supine to Sit, Sit to Supine     Supine to sit: Independent Sit to supine: Independent        Transfers Overall transfer level: Modified independent Equipment used: Rolling walker (2 wheels) Transfers: Sit to/from  Stand Sit to Stand: Modified independent (Device/Increase time)                Ambulation/Gait Ambulation/Gait assistance: Modified independent (Device/Increase time) Gait Distance (Feet): 40 Feet Assistive device: Rolling walker (2 wheels) Gait Pattern/deviations: Step-to pattern, Decreased weight shift to left Gait velocity: slightly decreased     General Gait Details: min verbal cues for sequencing with RW, WB status. Progressing well with mobility   Stairs             Wheelchair Mobility    Modified Rankin (Stroke Patients Only)       Balance Overall balance assessment: Modified Independent Sitting-balance support: Feet supported Sitting balance-Leahy Scale: Good     Standing balance support: Bilateral upper extremity supported, During functional activity, Reliant on assistive device for balance Standing balance-Leahy Scale: Good                              Cognition Arousal/Alertness: Awake/alert Behavior During Therapy: WFL for tasks assessed/performed Overall Cognitive Status: Within Functional Limits for tasks assessed                                          Exercises      General Comments        Pertinent Vitals/Pain Pain Assessment Pain Assessment: 0-10 Pain Score: 7  Pain Location: L knee Pain Descriptors / Indicators: Discomfort, Sore Pain Intervention(s): Monitored during session, Repositioned    Home Living  Prior Function            PT Goals (current goals can now be found in the care plan section) Acute Rehab PT Goals Patient Stated Goal: to return home as quickly as able PT Goal Formulation: With patient Time For Goal Achievement: 05/01/23 Potential to Achieve Goals: Good Progress towards PT goals: Progressing toward goals    Frequency    Min 3X/week      PT Plan Discharge plan needs to be updated    Co-evaluation              AM-PAC  PT "6 Clicks" Mobility   Outcome Measure  Help needed turning from your back to your side while in a flat bed without using bedrails?: None Help needed moving from lying on your back to sitting on the side of a flat bed without using bedrails?: None Help needed moving to and from a bed to a chair (including a wheelchair)?: A Little Help needed standing up from a chair using your arms (e.g., wheelchair or bedside chair)?: A Little Help needed to walk in hospital room?: A Little Help needed climbing 3-5 steps with a railing? : A Lot 6 Click Score: 19    End of Session Equipment Utilized During Treatment: Gait belt Activity Tolerance: Patient tolerated treatment well Patient left: in bed;with call bell/phone within reach;with bed alarm set Nurse Communication: Mobility status PT Visit Diagnosis: Difficulty in walking, not elsewhere classified (R26.2);Pain Pain - Right/Left: Left Pain - part of body: Knee     Time: 4098-1191 PT Time Calculation (min) (ACUTE ONLY): 17 min  Charges:  $Gait Training: 8-22 mins                     Gracen Ringwald, PT, GCS 04/27/23,11:11 AM

## 2023-04-27 NOTE — TOC Progression Note (Addendum)
Transition of Care [facility]) - Progression Note    Patient Details  Name: Cindy Velasquez MRN: [id number] Date of Birth: 21-May-1941  [facility]) CM/SW Contact  Lorri Frederick, LCSW Phone Number: 04/27/2023, 10:01 AM  Clinical Narrative:   Bed offers provided to pt, she is requesting responses from [facility], [facility], [place].  CSW reached out to those facilities.  [facility] and [facility] do offer, [facility] full.  Pt informed, asking for responses from [facility], [facility].  CSW reached out.   1115: no response yet but pt now wanting to accept offer at [facility] before that bed is gone.  [facility] informed and confirms.  1200: Auth request submitted in [place].    Expected Discharge Plan: IP Rehab Facility (vs  SNF) Barriers to Discharge: Continued Medical Work up  Expected Discharge Plan and Services   Discharge Planning Services: CM Consult   Living arrangements for the past 2 months: Single Family Home                                       Social Determinants of Health (SDOH) Interventions SDOH Screenings   Food Insecurity: No Food Insecurity (04/24/2023)  Housing: Low Risk  (04/24/2023)  Transportation Needs: No Transportation Needs (04/24/2023)  Utilities: Not At Risk (04/24/2023)  Alcohol Screen: Low Risk  (01/28/2022)  Depression (PHQ2-9): Medium Risk (01/27/2023)  Financial Resource Strain: Low Risk  (01/28/2022)  Physical Activity: Sufficiently Active (01/28/2022)  Social Connections: Moderately Integrated (01/28/2022)  Stress: No Stress Concern Present (01/28/2022)  Tobacco Use: Medium Risk (04/24/2023)    Readmission Risk Interventions     No data to display

## 2023-04-27 NOTE — Care Management Important Message (Signed)
Important Message  Patient Details  Name: ALOMA ORDNER MRN: [id number] Date of Birth: 07/27/1941   Medicare Important Message Given:  Yes     Sherilyn Banker 04/27/2023, 2:14 PM

## 2023-04-28 NOTE — Progress Notes (Signed)
Subjective: 5 Days Post-Op Procedure(s) (LRB): OPEN REDUCTION INTERNAL FIXATION (ORIF) LEFT PATELLA (Left) Patient reports pain as mild.    Objective: Vital signs in last 24 hours: Temp:  [97.6 F (36.4 C)-98.9 F (37.2 C)] 97.6 F (36.4 C) (05/21 0744) Pulse Rate:  [65-92] 65 (05/21 0744) Resp:  [14-18] 17 (05/21 0744) BP: (128-165)/(57-62) 165/58 (05/21 0744) SpO2:  [95 %-97 %] 97 % (05/21 0744)  Intake/Output from previous day: 05/20 0701 - 05/21 0700 In: 360 [P.O.:360] Out: -  Intake/Output this shift: Total I/O In: 240 [P.O.:240] Out: -   No results for input(s): "HGB" in the last 72 hours. No results for input(s): "WBC", "RBC", "HCT", "PLT" in the last 72 hours. No results for input(s): "NA", "K", "CL", "CO2", "BUN", "CREATININE", "GLUCOSE", "CALCIUM" in the last 72 hours. No results for input(s): "LABPT", "INR" in the last 72 hours.  Neurologically intact Neurovascular intact Sensation intact distally Intact pulses distally Dorsiflexion/Plantar flexion intact Incision: dressing C/D/I No cellulitis present Compartment soft   Assessment/Plan: 5 Days Post-Op Procedure(s) (LRB): OPEN REDUCTION INTERNAL FIXATION (ORIF) LEFT PATELLA (Left) Advance diet Up with therapy Discharge to SNF once insurance approves.  Patient is not safe to d/c home. WBAT LLE- hinged knee brace locked at 30 degrees of flexion to be worn at all times    Cristie Hem 04/28/2023, 12:02 PM

## 2023-04-28 NOTE — Progress Notes (Signed)
Physical Therapy Treatment Patient Details Name: LAZANDRA OHARE MRN: [id number] DOB: 17-Dec-1940 Today's Date: 04/28/2023   History of Present Illness Patient is a 82 year old female with left patella fracture s/p ORIF of left patella.    PT Comments    Patient received in bed, she is agreeable to PT session. Patient is mod I with bed mobility. Supervision for transfers and gait training. Cues needed for sequencing with RW. Instructed patient in LE exercises. Adjusted knee brace for her. She will continue to benefit from skilled PT to improve strength, activity tolerance and safety with mobility.      Recommendations for follow up therapy are one component of a multi-disciplinary discharge planning process, led by the attending physician.  Recommendations may be updated based on patient status, additional functional criteria and insurance authorization.  Follow Up Recommendations  Can patient physically be transported by private vehicle: Yes    Assistance Recommended at Discharge Intermittent Supervision/Assistance  Patient can return home with the following A little help with walking and/or transfers;A little help with bathing/dressing/bathroom;Help with stairs or ramp for entrance;Assist for transportation;Assistance with cooking/housework   Equipment Recommendations  Rolling walker (2 wheels)    Recommendations for Other Services       Precautions / Restrictions Precautions Precautions: Fall Required Braces or Orthoses: Other Brace Other Brace: hinged knee brace locked in extension Restrictions Weight Bearing Restrictions: Yes LLE Weight Bearing: Weight bearing as tolerated     Mobility  Bed Mobility Overal bed mobility: Independent Bed Mobility: Supine to Sit, Sit to Supine     Supine to sit: Independent Sit to supine: Independent        Transfers Overall transfer level: Needs assistance Equipment used: Rolling walker (2 wheels) Transfers: Sit to/from Stand Sit  to Stand: Supervision                Ambulation/Gait Ambulation/Gait assistance: Supervision Gait Distance (Feet): 110 Feet Assistive device: Rolling walker (2 wheels) Gait Pattern/deviations: Step-to pattern, Decreased weight shift to left, Decreased step length - right, Decreased stride length Gait velocity: slightly decreased     General Gait Details: Verbal cues for sequencing with RW. Progressing well. Continues to have limited wb on Left LE and step to gait pattern. Fatigued with ambulation.   Stairs             Wheelchair Mobility    Modified Rankin (Stroke Patients Only)       Balance Overall balance assessment: Needs assistance Sitting-balance support: Feet supported Sitting balance-Leahy Scale: Normal     Standing balance support: Bilateral upper extremity supported, During functional activity, Reliant on assistive device for balance Standing balance-Leahy Scale: Good Standing balance comment: no lob, supervision for safety                            Cognition Arousal/Alertness: Awake/alert Behavior During Therapy: WFL for tasks assessed/performed Overall Cognitive Status: Within Functional Limits for tasks assessed                                          Exercises Other Exercises Other Exercises: Educated patient on LE exercises to include, AP, SLR and hip abd/add    General Comments        Pertinent Vitals/Pain Pain Assessment Pain Assessment: Faces Faces Pain Scale: Hurts a little bit Pain Descriptors / Indicators:  Discomfort Pain Intervention(s): Monitored during session, Repositioned    Home Living Family/patient expects to be discharged to:: Skilled nursing facility Living Arrangements: Spouse/significant other Available Help at Discharge: Family;Available PRN/intermittently                    Prior Function            PT Goals (current goals can now be found in the care plan section)  Acute Rehab PT Goals Patient Stated Goal: to return home as quickly as safely able PT Goal Formulation: With patient Time For Goal Achievement: 05/01/23 Potential to Achieve Goals: Good Progress towards PT goals: Progressing toward goals    Frequency    Min 3X/week      PT Plan Current plan remains appropriate    Co-evaluation              AM-PAC PT "6 Clicks" Mobility   Outcome Measure  Help needed turning from your back to your side while in a flat bed without using bedrails?: None Help needed moving from lying on your back to sitting on the side of a flat bed without using bedrails?: None Help needed moving to and from a bed to a chair (including a wheelchair)?: A Little Help needed standing up from a chair using your arms (e.g., wheelchair or bedside chair)?: A Little Help needed to walk in hospital room?: A Little Help needed climbing 3-5 steps with a railing? : A Lot 6 Click Score: 19    End of Session Equipment Utilized During Treatment: Gait belt Activity Tolerance: Patient tolerated treatment well;Patient limited by fatigue Patient left: in bed;with call bell/phone within reach Nurse Communication: Mobility status PT Visit Diagnosis: Difficulty in walking, not elsewhere classified (R26.2);Pain;Muscle weakness (generalized) (M62.81) Pain - Right/Left: Left Pain - part of body: Knee     Time: 1355-1411 PT Time Calculation (min) (ACUTE ONLY): 16 min  Charges:  $Gait Training: 8-22 mins                     Rayen Palen, PT, GCS 04/28/23,2:20 PM

## 2023-04-28 NOTE — Plan of Care (Signed)
  Problem: Health Behavior/Discharge Planning: Goal: Ability to manage health-related needs will improve Outcome: Progressing   Problem: Activity: Goal: Risk for activity intolerance will decrease Outcome: Progressing   Problem: Nutrition: Goal: Adequate nutrition will be maintained Outcome: Progressing   Problem: Elimination: Goal: Will not experience complications related to bowel motility Outcome: Progressing Goal: Will not experience complications related to urinary retention Outcome: Progressing   Problem: Pain Managment: Goal: General experience of comfort will improve Outcome: Progressing   Problem: Safety: Goal: Ability to remain free from injury will improve Outcome: Progressing   Problem: Skin Integrity: Goal: Risk for impaired skin integrity will decrease Outcome: Progressing

## 2023-04-28 NOTE — Discharge Summary (Cosign Needed)
Patient ID: Cindy Velasquez MRN: [id number] DOB/AGE: 1941/07/31 82 y.o.  Admit date: 04/23/2023 Discharge date: 04/29/2023  Admission Diagnoses:  Principal Problem:   History of open reduction and internal fixation (ORIF) procedure Active Problems:   Closed displaced transverse fracture of left patella   Discharge Diagnoses:  Same  Past Medical History:  Diagnosis Date   Adenomatous colon polyp    Cancer (HCC) 03/2015   BCC R tibia    Difficult intubation    Diverticulosis of colon    GERD (gastroesophageal reflux disease)    Hip pain    Hyperlipidemia    Hyperplastic colon polyp    Hypertension    Lactose intolerance    Loose stools    Lower back pain    Osteoporosis    Palpitations     Surgeries: Procedure(s): OPEN REDUCTION INTERNAL FIXATION (ORIF) LEFT PATELLA on 04/23/2023   Consultants:   Discharged Condition: Improved  Hospital Course: Cindy Velasquez is an 82 y.o. female who was admitted 04/23/2023 for operative treatment ofHistory of open reduction and internal fixation (ORIF) procedure. Patient has severe unremitting pain that affects sleep, daily activities, and work/hobbies. After pre-op clearance the patient was taken to the operating room on 04/23/2023 and underwent  Procedure(s): OPEN REDUCTION INTERNAL FIXATION (ORIF) LEFT PATELLA.    Patient was given perioperative antibiotics:  Anti-infectives (From admission, onward)    Start     Dose/Rate Route Frequency Ordered Stop   04/23/23 2000  ceFAZolin (ANCEF) IVPB 2g/100 mL premix        2 g 200 mL/hr over 30 Minutes Intravenous Every 6 hours 04/23/23 1750 04/24/23 1000   04/23/23 1045  ceFAZolin (ANCEF) IVPB 2g/100 mL premix        2 g 200 mL/hr over 30 Minutes Intravenous On call to O.R. 04/23/23 1038 04/23/23 1434        Patient was given sequential compression devices, early ambulation, and chemoprophylaxis to prevent DVT.  Patient benefited maximally from hospital stay and there were no  complications.    Recent vital signs: Patient Vitals for the past 24 hrs:  BP Temp Temp src Pulse Resp SpO2  04/29/23 0420 137/76 98.2 F (36.8 C) -- (!) 41 17 99 %  04/28/23 1930 135/60 98 F (36.7 C) -- 99 14 96 %  04/28/23 1515 (!) 128/48 98.7 F (37.1 C) Oral 86 14 96 %  04/28/23 0744 (!) 165/58 97.6 F (36.4 C) Oral 65 17 97 %     Recent laboratory studies: No results for input(s): "WBC", "HGB", "HCT", "PLT", "NA", "K", "CL", "CO2", "BUN", "CREATININE", "GLUCOSE", "INR", "CALCIUM" in the last 72 hours.  Invalid input(s): "PT", "2"   Discharge Medications:   Allergies as of 04/29/2023       Reactions   Lactose Intolerance (gi)    Upset stomach/ bloating         Medication List     STOP taking these medications    acetaminophen 500 MG tablet Commonly known as: TYLENOL   ibuprofen 200 MG tablet Commonly known as: ADVIL       TAKE these medications    Benefiber Powd Take 1 Dose by mouth daily. 1 dose = 2 teaspoons   clotrimazole-betamethasone cream Commonly known as: LOTRISONE Apply 1 application topically 2 (two) times daily. Use as needed for rash What changed:  when to take this reasons to take this additional instructions   Culturelle Digestive Daily Caps Take 1 capsule by mouth daily.   escitalopram 10  MG tablet Commonly known as: LEXAPRO TAKE 1 TABLET(10 MG) BY MOUTH DAILY What changed: See the new instructions.   fluticasone 50 MCG/ACT nasal spray Commonly known as: FLONASE Place 2 sprays into both nostrils daily as needed for allergies.   hydrochlorothiazide 25 MG tablet Commonly known as: HYDRODIURIL TAKE 1/2 TABLET(12.5 MG) BY MOUTH EVERY MORNING   ICaps Areds 2 Caps Take 1 capsule by mouth in the morning and at bedtime.   lisinopril 20 MG tablet Commonly known as: ZESTRIL TAKE 1 TABLET BY MOUTH DAILY   Olopatadine HCl 0.2 % Soln Commonly known as: Pataday 1 gtt each eye qd What changed:  how much to take how to take  this when to take this additional instructions   omeprazole 20 MG capsule Commonly known as: PRILOSEC Take 1 capsule (20 mg total) by mouth daily.   ondansetron 4 MG disintegrating tablet Commonly known as: ZOFRAN-ODT Take 1 tablet (4 mg total) by mouth every 8 (eight) hours as needed for nausea or vomiting.   OVER THE COUNTER MEDICATION Apply 1 Application topically daily as needed (pain). THC pain relieving cream   oxyCODONE-acetaminophen 5-325 MG tablet Commonly known as: PERCOCET/ROXICET Take 1-2 tablets by mouth every 8 (eight) hours as needed for severe pain.   rosuvastatin 20 MG tablet Commonly known as: CRESTOR TAKE 1 TABLET(20 MG) BY MOUTH DAILY   Vitamin D 50 MCG (2000 UT) tablet Take 2,000 Units by mouth daily.               Durable Medical Equipment  (From admission, onward)           Start     Ordered   04/23/23 1751  DME Walker rolling  Once       Question:  Patient needs a walker to treat with the following condition  Answer:  History of open reduction and internal fixation (ORIF) procedure   04/23/23 1750   04/23/23 1751  DME 3 n 1  Once        04/23/23 1750   04/23/23 1751  DME Bedside commode  Once       Question:  Patient needs a bedside commode to treat with the following condition  Answer:  History of open reduction and internal fixation (ORIF) procedure   04/23/23 1750            Diagnostic Studies: DG Knee 1-2 Views Left  Result Date: 04/23/2023 CLINICAL DATA:  [id number] Elective surgery [id number] EXAM: LEFT KNEE - 1-2 VIEW COMPARISON:  Apr 17, 2023 FINDINGS: Spot fluoroscopy images were obtained for surgical planning purposes. This demonstrates patient is status post ORIF of the patella. Fracture fragments are in improved near anatomic alignment. Time: 54.2 seconds Dose: 1.16 mGy Please reference procedure report for further details. IMPRESSION: Spot fluoroscopy images obtained for surgical planning purposes. Electronically Signed   By:  Meda Klinefelter M.D.   On: 04/23/2023 15:49   DG C-Arm 1-60 Min-No Report  Result Date: 04/23/2023 Fluoroscopy was utilized by the requesting physician.  No radiographic interpretation.   DG Knee Complete 4 Views Left  Result Date: 04/17/2023 CLINICAL DATA:  Status post fall. EXAM: LEFT KNEE - COMPLETE 4+ VIEW COMPARISON:  Apr 16, 2020 FINDINGS: An acute fracture of the left patella is seen, with approximately 4.3 cm displacement of the dorsal and ventral fragments. There is no evidence of dislocation. Marked severity anterior soft tissue swelling is seen at the previously noted fracture site. A small to moderate sized joint effusion is  also noted. IMPRESSION: Acute, displaced fracture of the left patella. Electronically Signed   By: Aram Candela M.D.   On: 04/17/2023 20:40    Disposition: Discharge disposition: 03-Skilled Nursing Facility          Follow-up Information     Tarry Kos, MD. Schedule an appointment as soon as possible for a visit in 2 week(s).   Specialty: Orthopedic Surgery Contact information: [address] [place] [address] [phone]                  Signed: Cristie Hem 04/29/2023, 7:43 AM

## 2023-04-28 NOTE — TOC Progression Note (Addendum)
Transition of Care [facility]) - Progression Note    Patient Details  Name: Cindy Velasquez MRN: [id number] Date of Birth: [birth year]  [facility] CM/SW Contact  Lorri Frederick, LCSW Phone Number: 04/28/2023, 8:25 AM  Clinical Narrative:   SNF auth request still pending in [place].   1400: Auth remains pending in [place].  Expected Discharge Plan: IP Rehab Facility (vs  SNF) Barriers to Discharge: Continued Medical Work up  Expected Discharge Plan and Services   Discharge Planning Services: CM Consult   Living arrangements for the past 2 months: Single Family Home Expected Discharge Date: 04/28/23                                     Social Determinants of Health (SDOH) Interventions SDOH Screenings   Food Insecurity: No Food Insecurity (04/24/2023)  Housing: Low Risk  (04/24/2023)  Transportation Needs: No Transportation Needs (04/24/2023)  Utilities: Not At Risk (04/24/2023)  Alcohol Screen: Low Risk  (01/28/2022)  Depression (PHQ2-9): Medium Risk (01/27/2023)  Financial Resource Strain: Low Risk  (01/28/2022)  Physical Activity: Sufficiently Active (01/28/2022)  Social Connections: Moderately Integrated (01/28/2022)  Stress: No Stress Concern Present (01/28/2022)  Tobacco Use: Medium Risk (04/24/2023)    Readmission Risk Interventions     No data to display

## 2023-04-28 NOTE — Progress Notes (Signed)
Occupational Therapy Treatment Patient Details Name: Cindy Velasquez MRN: [id number] DOB: [birth year] Today's Date: 04/28/2023   History of present illness Patient is a 82 year old female with left patella fracture s/p ORIF of left patella.   OT comments  Pt is progressing well. Pt currently supervision for mobility, transfers, and ADLs, does need min-mod A for LB bathing/dressing due to LLE brace. Pt displays good balance, able to stand at sink unsupported performing ADLs with no LOB. Pt instructed on use of sock aide/reacher to perform LLE dressing, unable to due to bulky brace, but was able to practice with RLE, although Pt is currently able to don socks/shoes with RLE. Pt DC recommendation still appropriate, will be seen acutely to improve in functional independence.   Recommendations for follow up therapy are one component of a multi-disciplinary discharge planning process, led by the attending physician.  Recommendations may be updated based on patient status, additional functional criteria and insurance authorization.    Assistance Recommended at Discharge Frequent or constant Supervision/Assistance  Patient can return home with the following  Assistance with cooking/housework;Assist for transportation;Help with stairs or ramp for entrance;A little help with walking and/or transfers;A little help with bathing/dressing/bathroom   Equipment Recommendations  None recommended by OT    Recommendations for Other Services      Precautions / Restrictions Precautions Precautions: Fall Required Braces or Orthoses: Other Brace Other Brace: hinged knee brace locked in extension Restrictions Weight Bearing Restrictions: Yes LLE Weight Bearing: Weight bearing as tolerated       Mobility Bed Mobility Overal bed mobility: Independent                  Transfers Overall transfer level: Modified independent Equipment used: Rolling walker (2 wheels)                      Balance Overall balance assessment: Modified Independent                                         ADL either performed or assessed with clinical judgement   ADL Overall ADL's : Needs assistance/impaired Eating/Feeding: Independent;Bed level   Grooming: Supervision/safety;Sitting           Upper Body Dressing : Set up;Sitting   Lower Body Dressing: Sit to/from stand;Minimal assistance   Toilet Transfer: Supervision/safety   Toileting- Clothing Manipulation and Hygiene: Supervision/safety         General ADL Comments: Supervision for mobility, some help with LB dressing due to LLE brace    Extremity/Trunk Assessment Upper Extremity Assessment Upper Extremity Assessment: LUE deficits/detail;RUE deficits/detail RUE Deficits / Details: mild decreased sensation to B hands, no loss of function or strength RUE Sensation: decreased light touch LUE Deficits / Details: mild decreased sensation to B hands LUE Sensation: decreased light touch            Vision       Perception     Praxis      Cognition Arousal/Alertness: Awake/alert Behavior During Therapy: WFL for tasks assessed/performed Overall Cognitive Status: Within Functional Limits for tasks assessed                                          Exercises      Shoulder Instructions  General Comments      Pertinent Vitals/ Pain       Pain Assessment Pain Assessment: No/denies pain  Home Living                                          Prior Functioning/Environment              Frequency  Min 2X/week        Progress Toward Goals  OT Goals(current goals can now be found in the care plan section)  Progress towards OT goals: Progressing toward goals  Acute Rehab OT Goals OT Goal Formulation: With patient Time For Goal Achievement: 05/08/23 Potential to Achieve Goals: Good ADL Goals Pt Will Perform Grooming: with modified  independence;standing Pt Will Perform Lower Body Bathing: with modified independence;sit to/from stand;with adaptive equipment Pt Will Perform Lower Body Dressing: with modified independence;sit to/from stand;with adaptive equipment Pt Will Transfer to Toilet: with modified independence;ambulating;bedside commode Pt Will Perform Toileting - Clothing Manipulation and hygiene: with modified independence;sit to/from stand;sitting/lateral leans Additional ADL Goal #1: Pt will perform bed mobility independently in preparation for ADLs.  Plan Discharge plan remains appropriate    Co-evaluation                 AM-PAC OT "6 Clicks" Daily Activity     Outcome Measure   Help from another person eating meals?: None Help from another person taking care of personal grooming?: A Little Help from another person toileting, which includes using toliet, bedpan, or urinal?: A Little Help from another person bathing (including washing, rinsing, drying)?: A Little Help from another person to put on and taking off regular upper body clothing?: A Little Help from another person to put on and taking off regular lower body clothing?: A Little 6 Click Score: 19    End of Session Equipment Utilized During Treatment: Rolling walker (2 wheels);Left knee immobilizer  OT Visit Diagnosis: Unsteadiness on feet (R26.81);Other abnormalities of gait and mobility (R26.89);Pain Pain - Right/Left: Left Pain - part of body: Knee   Activity Tolerance Patient tolerated treatment well   Patient Left in bed;with call bell/phone within reach   Nurse Communication          Time: 1308-6578 OT Time Calculation (min): 24 min  Charges: OT General Charges $OT Visit: 1 Visit OT Treatments $Self Care/Home Management : 23-37 mins  [place], OTR/L   Alexis Goodell 04/28/2023, 1:44 PM
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
## 2023-04-29 NOTE — TOC Progression Note (Addendum)
Transition of Care [facility]) - Progression Note    Patient Details  Name: Cindy Velasquez MRN: [id number] Date of Birth: 1941-03-12  Transition of [facility]) CM/SW Contact  Lorri Frederick, LCSW Phone Number: 04/29/2023, 10:33 AM  Clinical Narrative:   Message from [place], peer to peer offered: call [phone], #5 by noon Wed.  CSW spoke to pt regarding this.  She wants to decline having MD complete P2P and move forward with private pay plan.  Lindsay/PA informed.  CSW informed Navi that pt declining the P2P.   1120: TC Navi.  Auth request has been denied.  CSW spoke with pt regarding transportation.  She has a friend that she will contact who can transport.   1350: Message from [employer].  She is all set to receive pt.      Expected Discharge Plan: IP Rehab Facility (vs  SNF) Barriers to Discharge: Continued Medical Work up  Expected Discharge Plan and Services   Discharge Planning Services: CM Consult   Living arrangements for the past 2 months: Single Family Home Expected Discharge Date: 04/29/23                                     Social Determinants of Health (SDOH) Interventions SDOH Screenings   Food Insecurity: No Food Insecurity (04/24/2023)  Housing: Low Risk  (04/24/2023)  Transportation Needs: No Transportation Needs (04/24/2023)  Utilities: Not At Risk (04/24/2023)  Alcohol Screen: Low Risk  (01/28/2022)  Depression (PHQ2-9): Medium Risk (01/27/2023)  Financial Resource Strain: Low Risk  (01/28/2022)  Physical Activity: Sufficiently Active (01/28/2022)  Social Connections: Moderately Integrated (01/28/2022)  Stress: No Stress Concern Present (01/28/2022)  Tobacco Use: Medium Risk (04/24/2023)    Readmission Risk Interventions     No data to display

## 2023-04-29 NOTE — Progress Notes (Signed)
Patient alert and oriented. IV access removed. Discharger teaching provided. Report called to [facility] at [facility]. Patient transported to lobby via volunteer services for private transportation to facility.   Erling Conte, RN

## 2023-04-29 NOTE — Progress Notes (Signed)
Mobility Specialist Progress Note   04/29/23 0950  Mobility  Activity Ambulated with assistance in hallway;Ambulated with assistance in room;Transferred to/from [facility]  Level of Assistance Standby assist, set-up cues, supervision of patient - no hands on  Assistive Device Front wheel walker  Distance Ambulated (ft) 110 ft  Range of Motion/Exercises Active;All extremities  LLE Weight Bearing WBAT  Activity Response Tolerated well   Patient received on BSC and agreeable to participate. Offered assistance but patient performed pericare independently. Stood and ambulated with supervision and slow steady gait. Required verbal cues for sequencing with RW. Also discussed ambulating with relaxed posture as she has a tendency to tense up. Returned to room without complaint or incident. Was left in supine with all needs met, call bell in reach.   [place] Tinia Oravec, BS EXP Mobility Specialist Please contact via SecureChat or Rehab office at [phone]

## 2023-04-29 NOTE — TOC Transition Note (Signed)
Transition of Care [facility]) - CM/SW Discharge Note   Patient Details  Name: Cindy Velasquez MRN: [id number] Date of Birth: May 03, 1941  [facility]) CM/SW Contact:  Lorri Frederick, LCSW Phone Number: 04/29/2023, 2:05 PM   Clinical Narrative:   Pt discharging to [facility], room 422.  RN call report to [phone].  Pt has 2 friends who will provide transport.  Pt will need to be brought down to main north tower entrance with assistance getting into the vehicle.     Final next level of care: Skilled Nursing Facility Barriers to Discharge: Barriers Resolved   Patient Goals and CMS Choice CMS Medicare.gov Compare Post Acute Care list provided to:: Patient    Discharge Placement                Patient chooses bed at: [facility] Patient to be transferred to facility by: friend Name of family member notified: son Caryn Bee Patient and family notified of of transfer: 04/29/23  Discharge Plan and Services Additional resources added to the After Visit Summary for     Discharge Planning Services: CM Consult                                 Social Determinants of Health (SDOH) Interventions SDOH Screenings   Food Insecurity: No Food Insecurity (04/24/2023)  Housing: Low Risk  (04/24/2023)  Transportation Needs: No Transportation Needs (04/24/2023)  Utilities: Not At Risk (04/24/2023)  Alcohol Screen: Low Risk  (01/28/2022)  Depression (PHQ2-9): Medium Risk (01/27/2023)  Financial Resource Strain: Low Risk  (01/28/2022)  Physical Activity: Sufficiently Active (01/28/2022)  Social Connections: Moderately Integrated (01/28/2022)  Stress: No Stress Concern Present (01/28/2022)  Tobacco Use: Medium Risk (04/24/2023)     Readmission Risk Interventions     No data to display

## 2023-04-30 ENCOUNTER — Telehealth: Payer: Self-pay

## 2023-04-30 DIAGNOSIS — M6281 Muscle weakness (generalized): Secondary | ICD-10-CM | POA: Diagnosis not present

## 2023-04-30 DIAGNOSIS — R2681 Unsteadiness on feet: Secondary | ICD-10-CM | POA: Diagnosis not present

## 2023-04-30 DIAGNOSIS — S82092D Other fracture of left patella, subsequent encounter for closed fracture with routine healing: Secondary | ICD-10-CM | POA: Diagnosis not present

## 2023-04-30 DIAGNOSIS — R2689 Other abnormalities of gait and mobility: Secondary | ICD-10-CM | POA: Diagnosis not present

## 2023-04-30 NOTE — Telephone Encounter (Signed)
Spoke with Baird Lyons from [facility]. She wanted to see if postop bandage should be removed. I explained that we will remove bandage when patient comes in for first postop visit.  She will call if she has any other questions.

## 2023-04-30 NOTE — Telephone Encounter (Signed)
Tried to call back. No answer. LMOM for her to call me back.

## 2023-04-30 NOTE — Telephone Encounter (Signed)
Baird Lyons, wound care nurse at [facility] would like a call back concerning wound care instructions for patient.  CB# [phone].  Please advise.  Thank you.

## 2023-05-01 DIAGNOSIS — R2681 Unsteadiness on feet: Secondary | ICD-10-CM | POA: Diagnosis not present

## 2023-05-01 DIAGNOSIS — S82092D Other fracture of left patella, subsequent encounter for closed fracture with routine healing: Secondary | ICD-10-CM | POA: Diagnosis not present

## 2023-05-01 DIAGNOSIS — R2689 Other abnormalities of gait and mobility: Secondary | ICD-10-CM | POA: Diagnosis not present

## 2023-05-01 DIAGNOSIS — M6281 Muscle weakness (generalized): Secondary | ICD-10-CM | POA: Diagnosis not present

## 2023-05-04 DIAGNOSIS — I10 Essential (primary) hypertension: Secondary | ICD-10-CM | POA: Diagnosis not present

## 2023-05-04 DIAGNOSIS — S82092D Other fracture of left patella, subsequent encounter for closed fracture with routine healing: Secondary | ICD-10-CM | POA: Diagnosis not present

## 2023-05-04 DIAGNOSIS — R2681 Unsteadiness on feet: Secondary | ICD-10-CM | POA: Diagnosis not present

## 2023-05-04 DIAGNOSIS — R2689 Other abnormalities of gait and mobility: Secondary | ICD-10-CM | POA: Diagnosis not present

## 2023-05-04 DIAGNOSIS — F32A Depression, unspecified: Secondary | ICD-10-CM | POA: Diagnosis not present

## 2023-05-04 DIAGNOSIS — M6281 Muscle weakness (generalized): Secondary | ICD-10-CM | POA: Diagnosis not present

## 2023-05-05 DIAGNOSIS — Z9181 History of falling: Secondary | ICD-10-CM | POA: Diagnosis not present

## 2023-05-05 DIAGNOSIS — R2689 Other abnormalities of gait and mobility: Secondary | ICD-10-CM | POA: Diagnosis not present

## 2023-05-05 DIAGNOSIS — S82092D Other fracture of left patella, subsequent encounter for closed fracture with routine healing: Secondary | ICD-10-CM | POA: Diagnosis not present

## 2023-05-05 DIAGNOSIS — R2681 Unsteadiness on feet: Secondary | ICD-10-CM | POA: Diagnosis not present

## 2023-05-05 DIAGNOSIS — I10 Essential (primary) hypertension: Secondary | ICD-10-CM | POA: Diagnosis not present

## 2023-05-05 DIAGNOSIS — K635 Polyp of colon: Secondary | ICD-10-CM | POA: Diagnosis not present

## 2023-05-05 DIAGNOSIS — M6281 Muscle weakness (generalized): Secondary | ICD-10-CM | POA: Diagnosis not present

## 2023-05-06 DIAGNOSIS — R2681 Unsteadiness on feet: Secondary | ICD-10-CM | POA: Diagnosis not present

## 2023-05-06 DIAGNOSIS — M6281 Muscle weakness (generalized): Secondary | ICD-10-CM | POA: Diagnosis not present

## 2023-05-06 DIAGNOSIS — R2689 Other abnormalities of gait and mobility: Secondary | ICD-10-CM | POA: Diagnosis not present

## 2023-05-06 DIAGNOSIS — S82092D Other fracture of left patella, subsequent encounter for closed fracture with routine healing: Secondary | ICD-10-CM | POA: Diagnosis not present

## 2023-05-07 ENCOUNTER — Telehealth: Payer: Self-pay | Admitting: Orthopaedic Surgery

## 2023-05-07 ENCOUNTER — Other Ambulatory Visit (INDEPENDENT_AMBULATORY_CARE_PROVIDER_SITE_OTHER): Payer: Medicare HMO

## 2023-05-07 ENCOUNTER — Ambulatory Visit (INDEPENDENT_AMBULATORY_CARE_PROVIDER_SITE_OTHER): Payer: Medicare HMO | Admitting: Physician Assistant

## 2023-05-07 ENCOUNTER — Encounter: Payer: Self-pay | Admitting: Physician Assistant

## 2023-05-07 DIAGNOSIS — S82042A Displaced comminuted fracture of left patella, initial encounter for closed fracture: Secondary | ICD-10-CM

## 2023-05-07 DIAGNOSIS — Z9181 History of falling: Secondary | ICD-10-CM | POA: Diagnosis not present

## 2023-05-07 DIAGNOSIS — S82092D Other fracture of left patella, subsequent encounter for closed fracture with routine healing: Secondary | ICD-10-CM | POA: Diagnosis not present

## 2023-05-07 DIAGNOSIS — R2681 Unsteadiness on feet: Secondary | ICD-10-CM | POA: Diagnosis not present

## 2023-05-07 DIAGNOSIS — R2689 Other abnormalities of gait and mobility: Secondary | ICD-10-CM | POA: Diagnosis not present

## 2023-05-07 DIAGNOSIS — M6281 Muscle weakness (generalized): Secondary | ICD-10-CM | POA: Diagnosis not present

## 2023-05-07 NOTE — Telephone Encounter (Signed)
Yes please
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
## 2023-05-07 NOTE — Telephone Encounter (Signed)
Patient states she was told by her insurance company [employer] in order for her to get home health they need request from Dr Roda Shutters marked urgent. In order to have it set up asap, since she will be leaving the facility in a few days.     Patient request a call back once done.

## 2023-05-08 ENCOUNTER — Other Ambulatory Visit: Payer: Self-pay | Admitting: Family Medicine

## 2023-05-08 DIAGNOSIS — S82092D Other fracture of left patella, subsequent encounter for closed fracture with routine healing: Secondary | ICD-10-CM | POA: Diagnosis not present

## 2023-05-08 DIAGNOSIS — R2689 Other abnormalities of gait and mobility: Secondary | ICD-10-CM | POA: Diagnosis not present

## 2023-05-08 DIAGNOSIS — M6281 Muscle weakness (generalized): Secondary | ICD-10-CM | POA: Diagnosis not present

## 2023-05-08 DIAGNOSIS — R2681 Unsteadiness on feet: Secondary | ICD-10-CM | POA: Diagnosis not present

## 2023-05-08 NOTE — Telephone Encounter (Signed)
Yes, once she is d/c SNF should set this up.  If they do not, please have patient let us know

## 2023-05-08 NOTE — Telephone Encounter (Signed)
Notified patient via voicemail. 

## 2023-05-10 DIAGNOSIS — M6281 Muscle weakness (generalized): Secondary | ICD-10-CM | POA: Diagnosis not present

## 2023-05-10 DIAGNOSIS — S82092D Other fracture of left patella, subsequent encounter for closed fracture with routine healing: Secondary | ICD-10-CM | POA: Diagnosis not present

## 2023-05-10 DIAGNOSIS — R2689 Other abnormalities of gait and mobility: Secondary | ICD-10-CM | POA: Diagnosis not present

## 2023-05-10 DIAGNOSIS — R2681 Unsteadiness on feet: Secondary | ICD-10-CM | POA: Diagnosis not present

## 2023-05-11 DIAGNOSIS — I10 Essential (primary) hypertension: Secondary | ICD-10-CM | POA: Diagnosis not present

## 2023-05-11 DIAGNOSIS — S82092D Other fracture of left patella, subsequent encounter for closed fracture with routine healing: Secondary | ICD-10-CM | POA: Diagnosis not present

## 2023-05-11 DIAGNOSIS — M6281 Muscle weakness (generalized): Secondary | ICD-10-CM | POA: Diagnosis not present

## 2023-05-11 DIAGNOSIS — E559 Vitamin D deficiency, unspecified: Secondary | ICD-10-CM | POA: Diagnosis not present

## 2023-05-11 DIAGNOSIS — R2681 Unsteadiness on feet: Secondary | ICD-10-CM | POA: Diagnosis not present

## 2023-05-11 DIAGNOSIS — F32A Depression, unspecified: Secondary | ICD-10-CM | POA: Diagnosis not present

## 2023-05-11 DIAGNOSIS — R2689 Other abnormalities of gait and mobility: Secondary | ICD-10-CM | POA: Diagnosis not present

## 2023-05-12 ENCOUNTER — Ambulatory Visit: Payer: Medicare HMO | Admitting: Family Medicine

## 2023-05-14 ENCOUNTER — Telehealth: Payer: Self-pay | Admitting: Physician Assistant

## 2023-05-14 NOTE — Telephone Encounter (Signed)
Lvm for pt to cb to get more information

## 2023-05-14 NOTE — Telephone Encounter (Signed)
Patient called. Says she wants to speak with Mardella Layman. Has questions. Would not tell me. Her call back number is [phone]

## 2023-05-15 ENCOUNTER — Telehealth: Payer: Self-pay | Admitting: Orthopaedic Surgery

## 2023-05-15 ENCOUNTER — Telehealth: Payer: Self-pay | Admitting: Family Medicine

## 2023-05-15 DIAGNOSIS — I10 Essential (primary) hypertension: Secondary | ICD-10-CM | POA: Diagnosis not present

## 2023-05-15 DIAGNOSIS — F339 Major depressive disorder, recurrent, unspecified: Secondary | ICD-10-CM | POA: Diagnosis not present

## 2023-05-15 DIAGNOSIS — K589 Irritable bowel syndrome without diarrhea: Secondary | ICD-10-CM | POA: Diagnosis not present

## 2023-05-15 DIAGNOSIS — E785 Hyperlipidemia, unspecified: Secondary | ICD-10-CM | POA: Diagnosis not present

## 2023-05-15 DIAGNOSIS — K219 Gastro-esophageal reflux disease without esophagitis: Secondary | ICD-10-CM | POA: Diagnosis not present

## 2023-05-15 DIAGNOSIS — K5730 Diverticulosis of large intestine without perforation or abscess without bleeding: Secondary | ICD-10-CM | POA: Diagnosis not present

## 2023-05-15 DIAGNOSIS — M4712 Other spondylosis with myelopathy, cervical region: Secondary | ICD-10-CM | POA: Diagnosis not present

## 2023-05-15 DIAGNOSIS — M4722 Other spondylosis with radiculopathy, cervical region: Secondary | ICD-10-CM | POA: Diagnosis not present

## 2023-05-15 DIAGNOSIS — M80062D Age-related osteoporosis with current pathological fracture, left lower leg, subsequent encounter for fracture with routine healing: Secondary | ICD-10-CM | POA: Diagnosis not present

## 2023-05-15 NOTE — Telephone Encounter (Signed)
[facility] did [facility] assessment today requesting verbal okay to continue care and would like to confirm if left knee range of motion is still at 30 degree flex ation, send protocol over via fax to [phone]. Debroah Loop phone number is [phone]

## 2023-05-15 NOTE — Telephone Encounter (Signed)
Advised per last note she is locked at 30 degrees until next follow-up. He stated understanding

## 2023-05-15 NOTE — Telephone Encounter (Signed)
Debroah Loop from [facility] has called requesting verbal orders for: Physical Therapy  1 week 3, 2 week 4, 1 week 2  Also for a home health aid: 2 week 3 and 1 week 3   Please Advise.  Debroah Loop: [phone]

## 2023-05-18 DIAGNOSIS — M80062D Age-related osteoporosis with current pathological fracture, left lower leg, subsequent encounter for fracture with routine healing: Secondary | ICD-10-CM | POA: Diagnosis not present

## 2023-05-18 DIAGNOSIS — E785 Hyperlipidemia, unspecified: Secondary | ICD-10-CM | POA: Diagnosis not present

## 2023-05-18 DIAGNOSIS — K589 Irritable bowel syndrome without diarrhea: Secondary | ICD-10-CM | POA: Diagnosis not present

## 2023-05-18 DIAGNOSIS — K219 Gastro-esophageal reflux disease without esophagitis: Secondary | ICD-10-CM | POA: Diagnosis not present

## 2023-05-18 DIAGNOSIS — F339 Major depressive disorder, recurrent, unspecified: Secondary | ICD-10-CM | POA: Diagnosis not present

## 2023-05-18 DIAGNOSIS — M4712 Other spondylosis with myelopathy, cervical region: Secondary | ICD-10-CM | POA: Diagnosis not present

## 2023-05-18 DIAGNOSIS — M4722 Other spondylosis with radiculopathy, cervical region: Secondary | ICD-10-CM | POA: Diagnosis not present

## 2023-05-18 DIAGNOSIS — I10 Essential (primary) hypertension: Secondary | ICD-10-CM | POA: Diagnosis not present

## 2023-05-18 DIAGNOSIS — K5730 Diverticulosis of large intestine without perforation or abscess without bleeding: Secondary | ICD-10-CM | POA: Diagnosis not present

## 2023-05-18 NOTE — Telephone Encounter (Signed)
Verbal orders given to Arnold.  

## 2023-05-20 DIAGNOSIS — M4712 Other spondylosis with myelopathy, cervical region: Secondary | ICD-10-CM | POA: Diagnosis not present

## 2023-05-20 DIAGNOSIS — K5730 Diverticulosis of large intestine without perforation or abscess without bleeding: Secondary | ICD-10-CM | POA: Diagnosis not present

## 2023-05-20 DIAGNOSIS — K589 Irritable bowel syndrome without diarrhea: Secondary | ICD-10-CM | POA: Diagnosis not present

## 2023-05-20 DIAGNOSIS — M4722 Other spondylosis with radiculopathy, cervical region: Secondary | ICD-10-CM | POA: Diagnosis not present

## 2023-05-20 DIAGNOSIS — F339 Major depressive disorder, recurrent, unspecified: Secondary | ICD-10-CM | POA: Diagnosis not present

## 2023-05-20 DIAGNOSIS — M80062D Age-related osteoporosis with current pathological fracture, left lower leg, subsequent encounter for fracture with routine healing: Secondary | ICD-10-CM | POA: Diagnosis not present

## 2023-05-20 DIAGNOSIS — I10 Essential (primary) hypertension: Secondary | ICD-10-CM | POA: Diagnosis not present

## 2023-05-20 DIAGNOSIS — E785 Hyperlipidemia, unspecified: Secondary | ICD-10-CM | POA: Diagnosis not present

## 2023-05-20 DIAGNOSIS — K219 Gastro-esophageal reflux disease without esophagitis: Secondary | ICD-10-CM | POA: Diagnosis not present

## 2023-05-21 ENCOUNTER — Other Ambulatory Visit: Payer: Self-pay

## 2023-05-21 ENCOUNTER — Telehealth: Payer: Self-pay | Admitting: Orthopaedic Surgery

## 2023-05-21 ENCOUNTER — Ambulatory Visit (INDEPENDENT_AMBULATORY_CARE_PROVIDER_SITE_OTHER): Payer: Medicare HMO | Admitting: Physician Assistant

## 2023-05-21 ENCOUNTER — Encounter: Payer: Self-pay | Admitting: Orthopaedic Surgery

## 2023-05-21 DIAGNOSIS — S82042A Displaced comminuted fracture of left patella, initial encounter for closed fracture: Secondary | ICD-10-CM

## 2023-05-21 DIAGNOSIS — M4722 Other spondylosis with radiculopathy, cervical region: Secondary | ICD-10-CM | POA: Diagnosis not present

## 2023-05-21 DIAGNOSIS — E785 Hyperlipidemia, unspecified: Secondary | ICD-10-CM | POA: Diagnosis not present

## 2023-05-21 DIAGNOSIS — K5730 Diverticulosis of large intestine without perforation or abscess without bleeding: Secondary | ICD-10-CM | POA: Diagnosis not present

## 2023-05-21 DIAGNOSIS — M4712 Other spondylosis with myelopathy, cervical region: Secondary | ICD-10-CM | POA: Diagnosis not present

## 2023-05-21 DIAGNOSIS — F339 Major depressive disorder, recurrent, unspecified: Secondary | ICD-10-CM | POA: Diagnosis not present

## 2023-05-21 DIAGNOSIS — M80062D Age-related osteoporosis with current pathological fracture, left lower leg, subsequent encounter for fracture with routine healing: Secondary | ICD-10-CM | POA: Diagnosis not present

## 2023-05-21 DIAGNOSIS — K589 Irritable bowel syndrome without diarrhea: Secondary | ICD-10-CM | POA: Diagnosis not present

## 2023-05-21 DIAGNOSIS — K219 Gastro-esophageal reflux disease without esophagitis: Secondary | ICD-10-CM | POA: Diagnosis not present

## 2023-05-21 DIAGNOSIS — I10 Essential (primary) hypertension: Secondary | ICD-10-CM | POA: Diagnosis not present

## 2023-05-21 NOTE — Progress Notes (Signed)
Post-Op Visit Note   Patient: Cindy Velasquez           Date of Birth: [birth year]           MRN: [id number] Visit Date: 05/21/2023 PCP: Donato Schultz, DO   Assessment & Plan:  Chief Complaint:  Chief Complaint  Patient presents with   Left Knee - Follow-up    ORIF patella 04/23/2023   Visit Diagnoses:  1. Closed displaced comminuted fracture of left patella, initial encounter     Plan: Patient is a pleasant 82 year old female who comes in today 4 weeks status post ORIF left patella fracture, date of surgery 04/23/2023.  She has been home for the past week.  She has been doing well.  No pain.  She has been compliant wearing her hinged knee brace locked from 0 to 30 degrees of flexion.  She is ambulating with a walker.  She is getting home health physical therapy.  Overall, doing much better.  Examination of the left knee reveals a fully healed surgical scar without complication.  Calf soft nontender.  She is neurovascular intact distally.  At this point we will open up her brace to 90 degrees of flexion.  Continue with physical therapy.  Follow-up in 2 weeks for repeat evaluation and x-rays.  Will likely fully open her brace at that point to full flexion.  Call with concerns or questions meantime.  Follow-Up Instructions: Return in about 2 weeks (around 06/04/2023).   Orders:  Orders Placed This Encounter  Procedures   XR Knee 1-2 Views Left   No orders of the defined types were placed in this encounter.   Imaging: No results found.  PMFS History: Patient Active Problem List   Diagnosis Date Noted   Closed displaced transverse fracture of left patella 04/23/2023   History of open reduction and internal fixation (ORIF) procedure 04/23/2023   Closed displaced comminuted fracture of left patella 04/21/2023   Dyspepsia 01/27/2023   Irritable bowel syndrome with diarrhea 01/05/2023   Stress at home 01/05/2023   Insomnia 01/05/2023   Cervical vertebral fusion 09/11/2022    Degenerative arthritis of cervical spine with cord compression 06/25/2022   Rash 04/29/2022   Pharyngitis 04/29/2022   Viral upper respiratory tract infection 04/29/2022   Onychomycosis 12/31/2021   Burn of left arm, first degree, initial encounter 10/10/2021   Wound of left leg 10/10/2021   Hand abrasion, infected, left, initial encounter 06/05/2021   Thoracic stenosis 07/27/2020   Dyslipidemia 07/27/2020   Prediabetes 12/13/2018   Vitamin D deficiency 12/13/2018   Overweight (BMI 25.0-29.9) 12/13/2018   Preventative health care 09/15/2018   Bilateral cold feet 06/17/2018   Idiopathic peripheral neuropathy 06/17/2018   Lower extremity edema 06/17/2018   Right shoulder pain 08/20/2016   Knee pain, bilateral 04/23/2015   Urticaria 10/13/2014   Neuropathy 10/13/2014   Fatigue 10/13/2014   Abrasion of face 08/01/2013   Rib pain on left side 07/25/2013   Tinea cruris 06/22/2012   Eustachian tube dysfunction 04/07/2012   Cerumen impaction 04/07/2012   Acute upper respiratory infection 10/30/2010   CHANGE IN BOWELS 04/03/2010   FECAL OCCULT BLOOD 04/03/2010   PERSONAL HX COLONIC POLYPS 04/03/2010   DYSPEPSIA 03/04/2010   GUAIAC POSITIVE STOOL 03/04/2010   HEMATURIA UNSPECIFIED 06/28/2009   DYSURIA 06/28/2009   HAMMER TOE 12/28/2008   POSTMENOPAUSAL STATUS 12/28/2008   BACK PAIN, LUMBAR 07/11/2008   BENIGN NEOPLASM OF SKIN SITE UNSPECIFIED 02/28/2008   Hyperlipidemia 04/15/2007  Essential hypertension 04/15/2007   DIVERTICULOSIS, COLON 04/15/2007   THUMB PAIN, RIGHT 04/15/2007   OSTEOPOROSIS 04/15/2007   Past Medical History:  Diagnosis Date   Adenomatous colon polyp    Cancer (HCC) 03/2015   BCC R tibia    Difficult intubation    Diverticulosis of colon    GERD (gastroesophageal reflux disease)    Hip pain    Hyperlipidemia    Hyperplastic colon polyp    Hypertension    Lactose intolerance    Loose stools    Lower back pain    Osteoporosis    Palpitations      Family History  Problem Relation Age of Onset   Hypertension Mother    Cancer Mother 59       ?   Hyperlipidemia Mother    Obesity Mother    Hypertension Father    Stroke Father    Heart disease Father    Hypertension Brother    Testicular cancer Brother 49   Hypertension Brother    Heart disease Brother        quadruple bypass   Colon cancer Neg Hx    Stomach cancer Neg Hx    Throat cancer Neg Hx     Past Surgical History:  Procedure Laterality Date   ANTERIOR CERVICAL DECOMP/DISCECTOMY FUSION N/A 09/11/2022   Procedure: CERVICAL FIVE-SIX ANTERIOR CERVICAL DECOMPRESSION/DISCECTOMY FUSION;  Surgeon: Bedelia Person, MD;  Location: [facility] OR;  Service: Neurosurgery;  Laterality: N/A;   BACK SURGERY     EYE SURGERY  12/08/2010   B/L 01/2011  02/2011   HAMMER TOE SURGERY     Bilateral   ORIF PATELLA Left 04/23/2023   Procedure: OPEN REDUCTION INTERNAL FIXATION (ORIF) LEFT PATELLA;  Surgeon: Tarry Kos, MD;  Location: [facility] OR;  Service: Orthopedics;  Laterality: Left;   ROTATOR CUFF REPAIR     Left   TUBAL LIGATION     Social History   Occupational History   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

## 2023-05-21 NOTE — Telephone Encounter (Signed)
yes

## 2023-05-21 NOTE — Telephone Encounter (Signed)
Cindy Velasquez from [facility] called stating Pt would like to ask if stretching 90 degrees is okay? Please advise CB [phone]

## 2023-05-22 DIAGNOSIS — K589 Irritable bowel syndrome without diarrhea: Secondary | ICD-10-CM | POA: Diagnosis not present

## 2023-05-22 DIAGNOSIS — M4722 Other spondylosis with radiculopathy, cervical region: Secondary | ICD-10-CM | POA: Diagnosis not present

## 2023-05-22 DIAGNOSIS — F339 Major depressive disorder, recurrent, unspecified: Secondary | ICD-10-CM | POA: Diagnosis not present

## 2023-05-22 DIAGNOSIS — K5730 Diverticulosis of large intestine without perforation or abscess without bleeding: Secondary | ICD-10-CM | POA: Diagnosis not present

## 2023-05-22 DIAGNOSIS — K219 Gastro-esophageal reflux disease without esophagitis: Secondary | ICD-10-CM | POA: Diagnosis not present

## 2023-05-22 DIAGNOSIS — M80062D Age-related osteoporosis with current pathological fracture, left lower leg, subsequent encounter for fracture with routine healing: Secondary | ICD-10-CM | POA: Diagnosis not present

## 2023-05-22 DIAGNOSIS — E785 Hyperlipidemia, unspecified: Secondary | ICD-10-CM | POA: Diagnosis not present

## 2023-05-22 DIAGNOSIS — I10 Essential (primary) hypertension: Secondary | ICD-10-CM | POA: Diagnosis not present

## 2023-05-22 DIAGNOSIS — M4712 Other spondylosis with myelopathy, cervical region: Secondary | ICD-10-CM | POA: Diagnosis not present

## 2023-05-22 NOTE — Telephone Encounter (Signed)
Called and advised.

## 2023-05-25 ENCOUNTER — Telehealth: Payer: Self-pay | Admitting: Family Medicine

## 2023-05-25 DIAGNOSIS — F339 Major depressive disorder, recurrent, unspecified: Secondary | ICD-10-CM | POA: Diagnosis not present

## 2023-05-25 DIAGNOSIS — M80062D Age-related osteoporosis with current pathological fracture, left lower leg, subsequent encounter for fracture with routine healing: Secondary | ICD-10-CM | POA: Diagnosis not present

## 2023-05-25 DIAGNOSIS — I10 Essential (primary) hypertension: Secondary | ICD-10-CM | POA: Diagnosis not present

## 2023-05-25 DIAGNOSIS — K589 Irritable bowel syndrome without diarrhea: Secondary | ICD-10-CM | POA: Diagnosis not present

## 2023-05-25 DIAGNOSIS — K5730 Diverticulosis of large intestine without perforation or abscess without bleeding: Secondary | ICD-10-CM | POA: Diagnosis not present

## 2023-05-25 DIAGNOSIS — M4722 Other spondylosis with radiculopathy, cervical region: Secondary | ICD-10-CM | POA: Diagnosis not present

## 2023-05-25 DIAGNOSIS — M4712 Other spondylosis with myelopathy, cervical region: Secondary | ICD-10-CM | POA: Diagnosis not present

## 2023-05-25 DIAGNOSIS — E785 Hyperlipidemia, unspecified: Secondary | ICD-10-CM | POA: Diagnosis not present

## 2023-05-25 DIAGNOSIS — K219 Gastro-esophageal reflux disease without esophagitis: Secondary | ICD-10-CM | POA: Diagnosis not present

## 2023-05-25 NOTE — Telephone Encounter (Signed)
Verbal given 

## 2023-05-25 NOTE — Telephone Encounter (Signed)
Don from [facility] has called for VERBAL ORDERS.  Occupational therapy  1 week 3  For functional mobility, pain control, safety, health promotion, exercise. Please advise  Roe [phone]

## 2023-05-26 DIAGNOSIS — M4712 Other spondylosis with myelopathy, cervical region: Secondary | ICD-10-CM | POA: Diagnosis not present

## 2023-05-26 DIAGNOSIS — K5730 Diverticulosis of large intestine without perforation or abscess without bleeding: Secondary | ICD-10-CM | POA: Diagnosis not present

## 2023-05-26 DIAGNOSIS — K219 Gastro-esophageal reflux disease without esophagitis: Secondary | ICD-10-CM | POA: Diagnosis not present

## 2023-05-26 DIAGNOSIS — M4722 Other spondylosis with radiculopathy, cervical region: Secondary | ICD-10-CM | POA: Diagnosis not present

## 2023-05-26 DIAGNOSIS — F339 Major depressive disorder, recurrent, unspecified: Secondary | ICD-10-CM | POA: Diagnosis not present

## 2023-05-26 DIAGNOSIS — I10 Essential (primary) hypertension: Secondary | ICD-10-CM | POA: Diagnosis not present

## 2023-05-26 DIAGNOSIS — M80062D Age-related osteoporosis with current pathological fracture, left lower leg, subsequent encounter for fracture with routine healing: Secondary | ICD-10-CM | POA: Diagnosis not present

## 2023-05-26 DIAGNOSIS — K589 Irritable bowel syndrome without diarrhea: Secondary | ICD-10-CM | POA: Diagnosis not present

## 2023-05-26 DIAGNOSIS — E785 Hyperlipidemia, unspecified: Secondary | ICD-10-CM | POA: Diagnosis not present

## 2023-05-27 DIAGNOSIS — K5730 Diverticulosis of large intestine without perforation or abscess without bleeding: Secondary | ICD-10-CM | POA: Diagnosis not present

## 2023-05-27 DIAGNOSIS — E785 Hyperlipidemia, unspecified: Secondary | ICD-10-CM | POA: Diagnosis not present

## 2023-05-27 DIAGNOSIS — M4722 Other spondylosis with radiculopathy, cervical region: Secondary | ICD-10-CM | POA: Diagnosis not present

## 2023-05-27 DIAGNOSIS — K589 Irritable bowel syndrome without diarrhea: Secondary | ICD-10-CM | POA: Diagnosis not present

## 2023-05-27 DIAGNOSIS — K219 Gastro-esophageal reflux disease without esophagitis: Secondary | ICD-10-CM | POA: Diagnosis not present

## 2023-05-27 DIAGNOSIS — M4712 Other spondylosis with myelopathy, cervical region: Secondary | ICD-10-CM | POA: Diagnosis not present

## 2023-05-27 DIAGNOSIS — M80062D Age-related osteoporosis with current pathological fracture, left lower leg, subsequent encounter for fracture with routine healing: Secondary | ICD-10-CM | POA: Diagnosis not present

## 2023-05-27 DIAGNOSIS — F339 Major depressive disorder, recurrent, unspecified: Secondary | ICD-10-CM | POA: Diagnosis not present

## 2023-05-27 DIAGNOSIS — I10 Essential (primary) hypertension: Secondary | ICD-10-CM | POA: Diagnosis not present

## 2023-05-29 DIAGNOSIS — M4712 Other spondylosis with myelopathy, cervical region: Secondary | ICD-10-CM | POA: Diagnosis not present

## 2023-05-29 DIAGNOSIS — E785 Hyperlipidemia, unspecified: Secondary | ICD-10-CM | POA: Diagnosis not present

## 2023-05-29 DIAGNOSIS — F339 Major depressive disorder, recurrent, unspecified: Secondary | ICD-10-CM | POA: Diagnosis not present

## 2023-05-29 DIAGNOSIS — K5730 Diverticulosis of large intestine without perforation or abscess without bleeding: Secondary | ICD-10-CM | POA: Diagnosis not present

## 2023-05-29 DIAGNOSIS — M80062D Age-related osteoporosis with current pathological fracture, left lower leg, subsequent encounter for fracture with routine healing: Secondary | ICD-10-CM | POA: Diagnosis not present

## 2023-05-29 DIAGNOSIS — K219 Gastro-esophageal reflux disease without esophagitis: Secondary | ICD-10-CM | POA: Diagnosis not present

## 2023-05-29 DIAGNOSIS — M4722 Other spondylosis with radiculopathy, cervical region: Secondary | ICD-10-CM | POA: Diagnosis not present

## 2023-05-29 DIAGNOSIS — I10 Essential (primary) hypertension: Secondary | ICD-10-CM | POA: Diagnosis not present

## 2023-05-29 DIAGNOSIS — K589 Irritable bowel syndrome without diarrhea: Secondary | ICD-10-CM | POA: Diagnosis not present

## 2023-06-01 ENCOUNTER — Ambulatory Visit (INDEPENDENT_AMBULATORY_CARE_PROVIDER_SITE_OTHER): Payer: Medicare HMO | Admitting: Family Medicine

## 2023-06-01 ENCOUNTER — Encounter: Payer: Self-pay | Admitting: Family Medicine

## 2023-06-01 VITALS — BP 128/60 | HR 76 | Temp 97.8°F | Resp 18 | Ht 59.0 in | Wt 153.8 lb

## 2023-06-01 DIAGNOSIS — M810 Age-related osteoporosis without current pathological fracture: Secondary | ICD-10-CM | POA: Diagnosis not present

## 2023-06-01 DIAGNOSIS — E785 Hyperlipidemia, unspecified: Secondary | ICD-10-CM

## 2023-06-01 DIAGNOSIS — E559 Vitamin D deficiency, unspecified: Secondary | ICD-10-CM

## 2023-06-01 DIAGNOSIS — Z0000 Encounter for general adult medical examination without abnormal findings: Secondary | ICD-10-CM | POA: Diagnosis not present

## 2023-06-01 DIAGNOSIS — I10 Essential (primary) hypertension: Secondary | ICD-10-CM

## 2023-06-01 DIAGNOSIS — R7303 Prediabetes: Secondary | ICD-10-CM | POA: Diagnosis not present

## 2023-06-01 LAB — CBC WITH DIFFERENTIAL/PLATELET
Basophils Absolute: 0.1 10*3/uL (ref 0.0–0.1)
Basophils Relative: 1.3 % (ref 0.0–3.0)
Eosinophils Absolute: 0.2 10*3/uL (ref 0.0–0.7)
Eosinophils Relative: 4.2 % (ref 0.0–5.0)
HCT: 41.9 % (ref 36.0–46.0)
Hemoglobin: 13.3 g/dL (ref 12.0–15.0)
Lymphocytes Relative: 26.5 % (ref 12.0–46.0)
Lymphs Abs: 1.5 10*3/uL (ref 0.7–4.0)
MCHC: 31.8 g/dL (ref 30.0–36.0)
MCV: 88.4 fl (ref 78.0–100.0)
Monocytes Absolute: 0.7 10*3/uL (ref 0.1–1.0)
Monocytes Relative: 11.9 % (ref 3.0–12.0)
Neutro Abs: 3.1 10*3/uL (ref 1.4–7.7)
Neutrophils Relative %: 56.1 % (ref 43.0–77.0)
Platelets: 267 10*3/uL (ref 150.0–400.0)
RBC: 4.74 Mil/uL (ref 3.87–5.11)
RDW: 16.5 % — ABNORMAL HIGH (ref 11.5–15.5)
WBC: 5.6 10*3/uL (ref 4.0–10.5)

## 2023-06-01 LAB — COMPREHENSIVE METABOLIC PANEL
ALT: 36 U/L — ABNORMAL HIGH (ref 0–35)
AST: 32 U/L (ref 0–37)
Albumin: 4 g/dL (ref 3.5–5.2)
Alkaline Phosphatase: 125 U/L — ABNORMAL HIGH (ref 39–117)
BUN: 17 mg/dL (ref 6–23)
CO2: 33 mEq/L — ABNORMAL HIGH (ref 19–32)
Calcium: 10.2 mg/dL (ref 8.4–10.5)
Chloride: 100 mEq/L (ref 96–112)
Creatinine, Ser: 0.69 mg/dL (ref 0.40–1.20)
GFR: 81.15 mL/min (ref >60.00)
Glucose, Bld: 97 mg/dL (ref 70–99)
Potassium: 4.2 mEq/L (ref 3.5–5.1)
Sodium: 140 mEq/L (ref 135–145)
Total Bilirubin: 0.5 mg/dL (ref 0.2–1.2)
Total Protein: 7.3 g/dL (ref 6.0–8.3)

## 2023-06-01 LAB — LIPID PANEL
Cholesterol: 148 mg/dL (ref 0–200)
HDL: 45.2 mg/dL (ref >39.00)
LDL Cholesterol: 85 mg/dL (ref 0–99)
NonHDL: 102.58
Total CHOL/HDL Ratio: 3
Triglycerides: 90 mg/dL (ref 0.0–149.0)
VLDL: 18 mg/dL (ref 0.0–40.0)

## 2023-06-01 LAB — VITAMIN D 25 HYDROXY (VIT D DEFICIENCY, FRACTURES): VITD: 48.08 ng/mL (ref 30.00–100.00)

## 2023-06-01 NOTE — Assessment & Plan Note (Signed)
Ghm utd Check labs  See AVS Health Maintenance  Topic Date Due   MAMMOGRAM  09/10/2022   Medicare Annual Wellness (AWV)  01/28/2023   DEXA SCAN  05/31/2024 (Originally 06/17/2013)   INFLUENZA VACCINE  07/09/2023   DTaP/Tdap/Td (3 - Td or Tdap) 06/05/2031   Pneumonia Vaccine 60+ Years old  Completed   COVID-19 Vaccine  Completed   Zoster Vaccines- Shingrix  Completed   HPV VACCINES  Aged Out   Hepatitis C Screening  Discontinued

## 2023-06-01 NOTE — Progress Notes (Signed)
Established Patient Office Visit  Subjective   Patient ID: Cindy Velasquez, female    DOB: [birth year]  Age: 82 y.o. MRN: [id number]  Chief Complaint  Patient presents with   Annual Exam    Pt states not fasting    HPI Discussed the use of AI scribe software for clinical note transcription with the patient, who gave verbal consent to proceed.  History of Present Illness   The patient, with a history of knee surgery, presents with discomfort related to their knee brace. They report that the brace does not stay in place, which they attribute to a possible issue with the Velcro belt. They have attempted to adjust the brace themselves, but it continues to shift out of place. They are scheduled to see their orthopedic surgeon later in the week and hope to discontinue the brace. They have achieved 90 degrees of flexion in the knee, which is the goal set by their physical therapy team.  In addition to the knee issue, the patient is also due for a mammogram and bone density test. They express willingness to schedule the mammogram but are less enthusiastic about the bone density test. They also mention a recent fall due to tripping over a carpet, but no significant injuries were sustained.      Patient Active Problem List   Diagnosis Date Noted   Closed displaced transverse fracture of left patella 04/23/2023   History of open reduction and internal fixation (ORIF) procedure 04/23/2023   Closed displaced comminuted fracture of left patella 04/21/2023   Dyspepsia 01/27/2023   Irritable bowel syndrome with diarrhea 01/05/2023   Stress at home 01/05/2023   Insomnia 01/05/2023   Cervical vertebral fusion 09/11/2022   Degenerative arthritis of cervical spine with cord compression 06/25/2022   Rash 04/29/2022   Pharyngitis 04/29/2022   Viral upper respiratory tract infection 04/29/2022   Onychomycosis 12/31/2021   Burn of left arm, first degree, initial encounter 10/10/2021   Wound of left leg  10/10/2021   Hand abrasion, infected, left, initial encounter 06/05/2021   Thoracic stenosis 07/27/2020   Dyslipidemia 07/27/2020   Prediabetes 12/13/2018   Vitamin D deficiency 12/13/2018   Overweight (BMI 25.0-29.9) 12/13/2018   Preventative health care 09/15/2018   Bilateral cold feet 06/17/2018   Idiopathic peripheral neuropathy 06/17/2018   Lower extremity edema 06/17/2018   Right shoulder pain 08/20/2016   Knee pain, bilateral 04/23/2015   Urticaria 10/13/2014   Neuropathy 10/13/2014   Fatigue 10/13/2014   Abrasion of face 08/01/2013   Rib pain on left side 07/25/2013   Tinea cruris 06/22/2012   Eustachian tube dysfunction 04/07/2012   Cerumen impaction 04/07/2012   Acute upper respiratory infection 10/30/2010   CHANGE IN BOWELS 04/03/2010   FECAL OCCULT BLOOD 04/03/2010   PERSONAL HX COLONIC POLYPS 04/03/2010   DYSPEPSIA 03/04/2010   GUAIAC POSITIVE STOOL 03/04/2010   HEMATURIA UNSPECIFIED 06/28/2009   DYSURIA 06/28/2009   HAMMER TOE 12/28/2008   POSTMENOPAUSAL STATUS 12/28/2008   BACK PAIN, LUMBAR 07/11/2008   BENIGN NEOPLASM OF SKIN SITE UNSPECIFIED 02/28/2008   Hyperlipidemia 04/15/2007   Essential hypertension 04/15/2007   DIVERTICULOSIS, COLON 04/15/2007   THUMB PAIN, RIGHT 04/15/2007   Osteoporosis 04/15/2007   Past Medical History:  Diagnosis Date   Adenomatous colon polyp    Cancer (HCC) 03/2015   BCC R tibia    Difficult intubation    Diverticulosis of colon    GERD (gastroesophageal reflux disease)    Hip pain    Hyperlipidemia  Hyperplastic colon polyp    Hypertension    Lactose intolerance    Loose stools    Lower back pain    Osteoporosis    Palpitations    Past Surgical History:  Procedure Laterality Date   ANTERIOR CERVICAL DECOMP/DISCECTOMY FUSION N/A 09/11/2022   Procedure: CERVICAL FIVE-SIX ANTERIOR CERVICAL DECOMPRESSION/DISCECTOMY FUSION;  Surgeon: Bedelia Person, MD;  Location: [facility] OR;  Service: Neurosurgery;  Laterality:  N/A;   BACK SURGERY     EYE SURGERY  12/08/2010   B/L 01/2011  02/2011   HAMMER TOE SURGERY     Bilateral   ORIF PATELLA Left 04/23/2023   Procedure: OPEN REDUCTION INTERNAL FIXATION (ORIF) LEFT PATELLA;  Surgeon: Tarry Kos, MD;  Location: [facility] OR;  Service: Orthopedics;  Laterality: Left;   ROTATOR CUFF REPAIR     Left   TUBAL LIGATION     Social History   Tobacco Use   Smoking status: Former    Packs/day: 0.50    Years: 30.00    Additional pack years: 0.00    Total pack years: 15.00    Types: Cigarettes    Quit date: 06/02/1991    Years since quitting: 32.0   Smokeless tobacco: Never  Vaping Use   Vaping Use: Never used  Substance Use Topics   Alcohol use: Yes    Alcohol/week: 2.0 standard drinks of alcohol    Types: 2 Glasses of wine per week   Drug use: No   Social History   Socioeconomic History   Marital status: Married    Spouse name: Cabrina Shiroma   Number of children: 4   Years of education: Not on file   Highest education level: Not on file  Occupational History   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  Tobacco Use   Smoking status: Former    Packs/day: 0.50    Years: 30.00    Additional pack years: 0.00    Total pack years: 15.00    Types: Cigarettes    Quit date: 06/02/1991    Years since quitting: 32.0   Smokeless tobacco: Never  Vaping Use   Vaping Use: Never used  Substance and Sexual Activity   Alcohol use: Yes    Alcohol/week: 2.0 standard drinks of alcohol    Types: 2 Glasses of wine per week   Drug use: No   Sexual activity: Not Currently    Partners: Male  Other Topics Concern   Not on file  Social History Narrative   Exercise--  [employer]--yoga and cardio 3x a week      Right Handed    Lives in a one story home    Social Determinants of Health   Financial Resource Strain: Low Risk  (01/28/2022)   Overall Financial Resource Strain (CARDIA)    Difficulty of Paying Living Expenses: Not hard at all  Food Insecurity: No  Food Insecurity (04/24/2023)   Hunger Vital Sign    Worried About Running Out of Food in the Last Year: Never true    Ran Out of Food in the Last Year: Never true  Transportation Needs: No Transportation Needs (04/24/2023)   PRAPARE - Administrator, Civil Service (Medical): No    Lack of Transportation (Non-Medical): No  Physical Activity: Sufficiently Active (01/28/2022)   Exercise Vital Sign    Days of Exercise per Week: 3 days    Minutes of Exercise per Session: 60 min  Stress: No Stress Concern Present (01/28/2022)   [employer] of Occupational  Health - Occupational Stress Questionnaire    Feeling of Stress : Not at all  Social Connections: Moderately Integrated (01/28/2022)   Social Connection and Isolation Panel [NHANES]    Frequency of Communication with Friends and Family: More than three times a week    Frequency of Social Gatherings with Friends and Family: More than three times a week    Attends Religious Services: Never    Database administrator or Organizations: Yes    Attends Engineer, structural: More than 4 times per year    Marital Status: Married  Catering manager Violence: Not At Risk (04/24/2023)   Humiliation, Afraid, Rape, and Kick questionnaire    Fear of Current or Ex-Partner: No    Emotionally Abused: No    Physically Abused: No    Sexually Abused: No   Family Status  Relation Name Status   Mother  Deceased at age 75   Father  Deceased at age 82   Brother  Alive   Brother  Alive   Neg Hx  (Not Specified)   Family History  Problem Relation Age of Onset   Hypertension Mother    Cancer Mother 40       ?   Hyperlipidemia Mother    Obesity Mother    Hypertension Father    Stroke Father    Heart disease Father    Hypertension Brother    Testicular cancer Brother 30   Hypertension Brother    Heart disease Brother        quadruple bypass   Colon cancer Neg Hx    Stomach cancer Neg Hx    Throat cancer Neg Hx    Allergies   Allergen Reactions   Lactose Intolerance (Gi)     Upset stomach/ bloating       ROS    Objective:     BP 128/60 (BP Location: Right Arm, Patient Position: Sitting, Cuff Size: Normal)   Pulse 76   Temp 97.8 F (36.6 C) (Oral)   Resp 18   Ht 4\' 11"  (1.499 m)   Wt 153 lb 12.8 oz (69.8 kg)   SpO2 95%   BMI 31.06 kg/m  BP Readings from Last 3 Encounters:  06/01/23 128/60  04/29/23 (!) 140/77  04/19/23 120/61   Wt Readings from Last 3 Encounters:  06/01/23 153 lb 12.8 oz (69.8 kg)  04/23/23 150 lb (68 kg)  04/19/23 145 lb 1 oz (65.8 kg)   SpO2 Readings from Last 3 Encounters:  06/01/23 95%  04/29/23 95%  04/19/23 96%      Physical Exam   No results found for any visits on 06/01/23.  Last CBC Lab Results  Component Value Date   WBC 9.0 04/19/2023   HGB 11.7 (L) 04/19/2023   HCT 37.4 04/19/2023   MCV 89.9 04/19/2023   MCH 28.1 04/19/2023   RDW 15.9 (H) 04/19/2023   PLT 192 04/19/2023   Last metabolic panel Lab Results  Component Value Date   GLUCOSE 170 (H) 04/19/2023   NA 140 04/19/2023   K 3.6 04/19/2023   CL 100 04/19/2023   CO2 28 04/19/2023   BUN 27 (H) 04/19/2023   CREATININE 1.04 (H) 04/19/2023   GFRNONAA 54 (L) 04/19/2023   CALCIUM 8.9 04/19/2023   PROT 7.3 01/27/2023   ALBUMIN 4.2 01/27/2023   LABGLOB 3.1 12/09/2018   AGRATIO 1.4 12/09/2018   BILITOT 0.5 01/27/2023   ALKPHOS 111 01/27/2023   AST 22 01/27/2023   ALT 22  01/27/2023   ANIONGAP 12 04/19/2023   Last lipids Lab Results  Component Value Date   CHOL 156 01/05/2023   HDL 43.90 01/05/2023   LDLCALC 83 01/05/2023   LDLDIRECT 129.1 08/23/2007   TRIG 147.0 01/05/2023   CHOLHDL 4 01/05/2023   Last hemoglobin A1c Lab Results  Component Value Date   HGBA1C 5.9 (H) 08/02/2020   Last thyroid functions Lab Results  Component Value Date   TSH 1.59 01/05/2023   T4TOTAL 9.7 09/03/2018   Last vitamin D Lab Results  Component Value Date   VD25OH 35.41 01/05/2023   Last  vitamin B12 and Folate Lab Results  Component Value Date   VITAMINB12 534 10/13/2014   FOLATE >20.0 10/13/2014      The ASCVD Risk score (Arnett DK, et al., 2019) failed to calculate for the following reasons:   The 2019 ASCVD risk score is only valid for ages 62 to 61    Assessment & Plan:   Problem List Items Addressed This Visit       Unprioritized   Vitamin D deficiency    Check labs       Relevant Orders   VITAMIN D 25 Hydroxy (Vit-D Deficiency, Fractures)   Preventative health care - Primary    Ghm utd Check labs  See AVS Health Maintenance  Topic Date Due   MAMMOGRAM  09/10/2022   Medicare Annual Wellness (AWV)  01/28/2023   DEXA SCAN  05/31/2024 (Originally 06/17/2013)   INFLUENZA VACCINE  07/09/2023   DTaP/Tdap/Td (3 - Td or Tdap) 06/05/2031   Pneumonia Vaccine 72+ Years old  Completed   COVID-19 Vaccine  Completed   Zoster Vaccines- Shingrix  Completed   HPV VACCINES  Aged Out   Hepatitis C Screening  Discontinued        Prediabetes    Check labs  Con't diet       Osteoporosis    Pt does not want to do anymore bone densities --- she does not want to take meds       Hyperlipidemia    Encourage heart healthy diet such as MIND or DASH diet, increase exercise, avoid trans fats, simple carbohydrates and processed foods, consider a krill or fish or flaxseed oil cap daily.        Relevant Orders   Lipid panel   Comprehensive metabolic panel   Essential hypertension    Well controlled, no changes to meds. Encouraged heart healthy diet such as the DASH diet and exercise as tolerated.        Relevant Orders   Comprehensive metabolic panel   CBC with Differential/Platelet  Assessment and Plan    Knee Surgery Recovery: Patient reports discomfort with brace and difficulty keeping it in place. Currently in physical therapy with a range of motion up to 90 degrees. Anticipating follow-up appointment with surgeon. -Continue physical therapy as  prescribed. -Address brace concerns with surgeon at upcoming appointment.  General Health Maintenance: Due for bone density test and mammogram. -Schedule mammogram at convenience of patient. -Discuss scheduling bone density test at a future date.        Return in about 6 months (around 12/01/2023), or if symptoms worsen or fail to improve.    Donato Schultz, DO

## 2023-06-01 NOTE — Patient Instructions (Signed)
Preventive Care 65 Years and Older, Female Preventive care refers to lifestyle choices and visits with your health care provider that can promote health and wellness. Preventive care visits are also called wellness exams. What can I expect for my preventive care visit? Counseling Your health care provider may ask you questions about your: Medical history, including: Past medical problems. Family medical history. Pregnancy and menstrual history. History of falls. Current health, including: Memory and ability to understand (cognition). Emotional well-being. Home life and relationship well-being. Sexual activity and sexual health. Lifestyle, including: Alcohol, nicotine or tobacco, and drug use. Access to firearms. Diet, exercise, and sleep habits. Work and work environment. Sunscreen use. Safety issues such as seatbelt and bike helmet use. Physical exam Your health care provider will check your: Height and weight. These may be used to calculate your BMI (body mass index). BMI is a measurement that tells if you are at a healthy weight. Waist circumference. This measures the distance around your waistline. This measurement also tells if you are at a healthy weight and may help predict your risk of certain diseases, such as type 2 diabetes and high blood pressure. Heart rate and blood pressure. Body temperature. Skin for abnormal spots. What immunizations do I need?  Vaccines are usually given at various ages, according to a schedule. Your health care provider will recommend vaccines for you based on your age, medical history, and lifestyle or other factors, such as travel or where you work. What tests do I need? Screening Your health care provider may recommend screening tests for certain conditions. This may include: Lipid and cholesterol levels. Hepatitis C test. Hepatitis B test. HIV (human immunodeficiency virus) test. STI (sexually transmitted infection) testing, if you are at  risk. Lung cancer screening. Colorectal cancer screening. Diabetes screening. This is done by checking your blood sugar (glucose) after you have not eaten for a while (fasting). Mammogram. Talk with your health care provider about how often you should have regular mammograms. BRCA-related cancer screening. This may be done if you have a family history of breast, ovarian, tubal, or peritoneal cancers. Bone density scan. This is done to screen for osteoporosis. Talk with your health care provider about your test results, treatment options, and if necessary, the need for more tests. Follow these instructions at home: Eating and drinking  Eat a diet that includes fresh fruits and vegetables, whole grains, lean protein, and low-fat dairy products. Limit your intake of foods with high amounts of sugar, saturated fats, and salt. Take vitamin and mineral supplements as recommended by your health care provider. Do not drink alcohol if your health care provider tells you not to drink. If you drink alcohol: Limit how much you have to 0-1 drink a day. Know how much alcohol is in your drink. In the [place] one drink equals one 12 oz bottle of beer (355 mL), one 5 oz glass of wine (148 mL), or one 1 oz glass of hard liquor (44 mL). Lifestyle Brush your teeth every morning and night with fluoride toothpaste. Floss one time each day. Exercise for at least 30 minutes 5 or more days each week. Do not use any products that contain nicotine or tobacco. These products include cigarettes, chewing tobacco, and vaping devices, such as e-cigarettes. If you need help quitting, ask your health care provider. Do not use drugs. If you are sexually active, practice safe sex. Use a condom or other form of protection in order to prevent STIs. Take aspirin only as told by   your health care provider. Make sure that you understand how much to take and what form to take. Work with your health care provider to find out whether it  is safe and beneficial for you to take aspirin daily. Ask your health care provider if you need to take a cholesterol-lowering medicine (statin). Find healthy ways to manage stress, such as: Meditation, yoga, or listening to music. Journaling. Talking to a trusted person. Spending time with friends and family. Minimize exposure to UV radiation to reduce your risk of skin cancer. Safety Always wear your seat belt while driving or riding in a vehicle. Do not drive: If you have been drinking alcohol. Do not ride with someone who has been drinking. When you are tired or distracted. While texting. If you have been using any mind-altering substances or drugs. Wear a helmet and other protective equipment during sports activities. If you have firearms in your house, make sure you follow all gun safety procedures. What's next? Visit your health care provider once a year for an annual wellness visit. Ask your health care provider how often you should have your eyes and teeth checked. Stay up to date on all vaccines. This information is not intended to replace advice given to you by your health care provider. Make sure you discuss any questions you have with your health care provider. Document Revised: 05/22/2021 Document Reviewed: 05/22/2021 Elsevier Patient Education  2024 Elsevier Inc.  

## 2023-06-01 NOTE — Assessment & Plan Note (Signed)
Check labs  Cont diet 

## 2023-06-01 NOTE — Assessment & Plan Note (Signed)
Well controlled, no changes to meds. Encouraged heart healthy diet such as the DASH diet and exercise as tolerated.  °

## 2023-06-01 NOTE — Assessment & Plan Note (Signed)
Check labs 

## 2023-06-01 NOTE — Assessment & Plan Note (Signed)
Encourage heart healthy diet such as MIND or DASH diet, increase exercise, avoid trans fats, simple carbohydrates and processed foods, consider a krill or fish or flaxseed oil cap daily.  °

## 2023-06-01 NOTE — Assessment & Plan Note (Signed)
Pt does not want to do anymore bone densities --- she does not want to take meds

## 2023-06-02 DIAGNOSIS — E785 Hyperlipidemia, unspecified: Secondary | ICD-10-CM | POA: Diagnosis not present

## 2023-06-02 DIAGNOSIS — F339 Major depressive disorder, recurrent, unspecified: Secondary | ICD-10-CM | POA: Diagnosis not present

## 2023-06-02 DIAGNOSIS — I10 Essential (primary) hypertension: Secondary | ICD-10-CM | POA: Diagnosis not present

## 2023-06-02 DIAGNOSIS — M4722 Other spondylosis with radiculopathy, cervical region: Secondary | ICD-10-CM | POA: Diagnosis not present

## 2023-06-02 DIAGNOSIS — M4712 Other spondylosis with myelopathy, cervical region: Secondary | ICD-10-CM | POA: Diagnosis not present

## 2023-06-02 DIAGNOSIS — K219 Gastro-esophageal reflux disease without esophagitis: Secondary | ICD-10-CM | POA: Diagnosis not present

## 2023-06-02 DIAGNOSIS — K589 Irritable bowel syndrome without diarrhea: Secondary | ICD-10-CM | POA: Diagnosis not present

## 2023-06-02 DIAGNOSIS — M80062D Age-related osteoporosis with current pathological fracture, left lower leg, subsequent encounter for fracture with routine healing: Secondary | ICD-10-CM | POA: Diagnosis not present

## 2023-06-02 DIAGNOSIS — K5730 Diverticulosis of large intestine without perforation or abscess without bleeding: Secondary | ICD-10-CM | POA: Diagnosis not present

## 2023-06-03 DIAGNOSIS — M4722 Other spondylosis with radiculopathy, cervical region: Secondary | ICD-10-CM | POA: Diagnosis not present

## 2023-06-03 DIAGNOSIS — F339 Major depressive disorder, recurrent, unspecified: Secondary | ICD-10-CM | POA: Diagnosis not present

## 2023-06-03 DIAGNOSIS — I10 Essential (primary) hypertension: Secondary | ICD-10-CM | POA: Diagnosis not present

## 2023-06-03 DIAGNOSIS — K589 Irritable bowel syndrome without diarrhea: Secondary | ICD-10-CM | POA: Diagnosis not present

## 2023-06-03 DIAGNOSIS — M80062D Age-related osteoporosis with current pathological fracture, left lower leg, subsequent encounter for fracture with routine healing: Secondary | ICD-10-CM | POA: Diagnosis not present

## 2023-06-03 DIAGNOSIS — E785 Hyperlipidemia, unspecified: Secondary | ICD-10-CM | POA: Diagnosis not present

## 2023-06-03 DIAGNOSIS — K5730 Diverticulosis of large intestine without perforation or abscess without bleeding: Secondary | ICD-10-CM | POA: Diagnosis not present

## 2023-06-03 DIAGNOSIS — M4712 Other spondylosis with myelopathy, cervical region: Secondary | ICD-10-CM | POA: Diagnosis not present

## 2023-06-03 DIAGNOSIS — K219 Gastro-esophageal reflux disease without esophagitis: Secondary | ICD-10-CM | POA: Diagnosis not present

## 2023-06-04 ENCOUNTER — Ambulatory Visit (INDEPENDENT_AMBULATORY_CARE_PROVIDER_SITE_OTHER): Payer: Medicare HMO | Admitting: Orthopaedic Surgery

## 2023-06-04 ENCOUNTER — Encounter: Payer: Self-pay | Admitting: Orthopaedic Surgery

## 2023-06-04 ENCOUNTER — Other Ambulatory Visit (INDEPENDENT_AMBULATORY_CARE_PROVIDER_SITE_OTHER): Payer: Medicare HMO

## 2023-06-04 DIAGNOSIS — M80062D Age-related osteoporosis with current pathological fracture, left lower leg, subsequent encounter for fracture with routine healing: Secondary | ICD-10-CM | POA: Diagnosis not present

## 2023-06-04 DIAGNOSIS — S82042A Displaced comminuted fracture of left patella, initial encounter for closed fracture: Secondary | ICD-10-CM | POA: Diagnosis not present

## 2023-06-04 DIAGNOSIS — K589 Irritable bowel syndrome without diarrhea: Secondary | ICD-10-CM | POA: Diagnosis not present

## 2023-06-04 DIAGNOSIS — F339 Major depressive disorder, recurrent, unspecified: Secondary | ICD-10-CM | POA: Diagnosis not present

## 2023-06-04 DIAGNOSIS — M4722 Other spondylosis with radiculopathy, cervical region: Secondary | ICD-10-CM | POA: Diagnosis not present

## 2023-06-04 DIAGNOSIS — M4712 Other spondylosis with myelopathy, cervical region: Secondary | ICD-10-CM | POA: Diagnosis not present

## 2023-06-04 DIAGNOSIS — I10 Essential (primary) hypertension: Secondary | ICD-10-CM | POA: Diagnosis not present

## 2023-06-04 DIAGNOSIS — K219 Gastro-esophageal reflux disease without esophagitis: Secondary | ICD-10-CM | POA: Diagnosis not present

## 2023-06-04 DIAGNOSIS — K5730 Diverticulosis of large intestine without perforation or abscess without bleeding: Secondary | ICD-10-CM | POA: Diagnosis not present

## 2023-06-04 DIAGNOSIS — E785 Hyperlipidemia, unspecified: Secondary | ICD-10-CM | POA: Diagnosis not present
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
## 2023-06-05 DIAGNOSIS — K589 Irritable bowel syndrome without diarrhea: Secondary | ICD-10-CM | POA: Diagnosis not present

## 2023-06-05 DIAGNOSIS — M4712 Other spondylosis with myelopathy, cervical region: Secondary | ICD-10-CM | POA: Diagnosis not present

## 2023-06-05 DIAGNOSIS — F339 Major depressive disorder, recurrent, unspecified: Secondary | ICD-10-CM | POA: Diagnosis not present

## 2023-06-05 DIAGNOSIS — K5730 Diverticulosis of large intestine without perforation or abscess without bleeding: Secondary | ICD-10-CM | POA: Diagnosis not present

## 2023-06-05 DIAGNOSIS — M80062D Age-related osteoporosis with current pathological fracture, left lower leg, subsequent encounter for fracture with routine healing: Secondary | ICD-10-CM | POA: Diagnosis not present

## 2023-06-05 DIAGNOSIS — I10 Essential (primary) hypertension: Secondary | ICD-10-CM | POA: Diagnosis not present

## 2023-06-05 DIAGNOSIS — K219 Gastro-esophageal reflux disease without esophagitis: Secondary | ICD-10-CM | POA: Diagnosis not present

## 2023-06-05 DIAGNOSIS — M4722 Other spondylosis with radiculopathy, cervical region: Secondary | ICD-10-CM | POA: Diagnosis not present

## 2023-06-05 DIAGNOSIS — E785 Hyperlipidemia, unspecified: Secondary | ICD-10-CM | POA: Diagnosis not present

## 2023-06-08 DIAGNOSIS — F339 Major depressive disorder, recurrent, unspecified: Secondary | ICD-10-CM | POA: Diagnosis not present

## 2023-06-08 DIAGNOSIS — M80062D Age-related osteoporosis with current pathological fracture, left lower leg, subsequent encounter for fracture with routine healing: Secondary | ICD-10-CM | POA: Diagnosis not present

## 2023-06-08 DIAGNOSIS — K589 Irritable bowel syndrome without diarrhea: Secondary | ICD-10-CM | POA: Diagnosis not present

## 2023-06-08 DIAGNOSIS — M4712 Other spondylosis with myelopathy, cervical region: Secondary | ICD-10-CM | POA: Diagnosis not present

## 2023-06-08 DIAGNOSIS — I10 Essential (primary) hypertension: Secondary | ICD-10-CM | POA: Diagnosis not present

## 2023-06-08 DIAGNOSIS — K219 Gastro-esophageal reflux disease without esophagitis: Secondary | ICD-10-CM | POA: Diagnosis not present

## 2023-06-08 DIAGNOSIS — M4722 Other spondylosis with radiculopathy, cervical region: Secondary | ICD-10-CM | POA: Diagnosis not present

## 2023-06-08 DIAGNOSIS — E785 Hyperlipidemia, unspecified: Secondary | ICD-10-CM | POA: Diagnosis not present

## 2023-06-08 DIAGNOSIS — K5730 Diverticulosis of large intestine without perforation or abscess without bleeding: Secondary | ICD-10-CM | POA: Diagnosis not present

## 2023-06-09 DIAGNOSIS — E785 Hyperlipidemia, unspecified: Secondary | ICD-10-CM | POA: Diagnosis not present

## 2023-06-09 DIAGNOSIS — K219 Gastro-esophageal reflux disease without esophagitis: Secondary | ICD-10-CM | POA: Diagnosis not present

## 2023-06-09 DIAGNOSIS — M4722 Other spondylosis with radiculopathy, cervical region: Secondary | ICD-10-CM | POA: Diagnosis not present

## 2023-06-09 DIAGNOSIS — M80062D Age-related osteoporosis with current pathological fracture, left lower leg, subsequent encounter for fracture with routine healing: Secondary | ICD-10-CM | POA: Diagnosis not present

## 2023-06-09 DIAGNOSIS — K589 Irritable bowel syndrome without diarrhea: Secondary | ICD-10-CM | POA: Diagnosis not present

## 2023-06-09 DIAGNOSIS — F339 Major depressive disorder, recurrent, unspecified: Secondary | ICD-10-CM | POA: Diagnosis not present

## 2023-06-09 DIAGNOSIS — I10 Essential (primary) hypertension: Secondary | ICD-10-CM | POA: Diagnosis not present

## 2023-06-09 DIAGNOSIS — K5730 Diverticulosis of large intestine without perforation or abscess without bleeding: Secondary | ICD-10-CM | POA: Diagnosis not present

## 2023-06-09 DIAGNOSIS — M4712 Other spondylosis with myelopathy, cervical region: Secondary | ICD-10-CM | POA: Diagnosis not present

## 2023-06-10 DIAGNOSIS — E785 Hyperlipidemia, unspecified: Secondary | ICD-10-CM | POA: Diagnosis not present

## 2023-06-10 DIAGNOSIS — M4712 Other spondylosis with myelopathy, cervical region: Secondary | ICD-10-CM | POA: Diagnosis not present

## 2023-06-10 DIAGNOSIS — M80062D Age-related osteoporosis with current pathological fracture, left lower leg, subsequent encounter for fracture with routine healing: Secondary | ICD-10-CM | POA: Diagnosis not present

## 2023-06-10 DIAGNOSIS — K219 Gastro-esophageal reflux disease without esophagitis: Secondary | ICD-10-CM | POA: Diagnosis not present

## 2023-06-10 DIAGNOSIS — M4722 Other spondylosis with radiculopathy, cervical region: Secondary | ICD-10-CM | POA: Diagnosis not present

## 2023-06-10 DIAGNOSIS — F339 Major depressive disorder, recurrent, unspecified: Secondary | ICD-10-CM | POA: Diagnosis not present

## 2023-06-10 DIAGNOSIS — K5730 Diverticulosis of large intestine without perforation or abscess without bleeding: Secondary | ICD-10-CM | POA: Diagnosis not present

## 2023-06-10 DIAGNOSIS — I10 Essential (primary) hypertension: Secondary | ICD-10-CM | POA: Diagnosis not present

## 2023-06-10 DIAGNOSIS — K589 Irritable bowel syndrome without diarrhea: Secondary | ICD-10-CM | POA: Diagnosis not present

## 2023-06-15 DIAGNOSIS — K5730 Diverticulosis of large intestine without perforation or abscess without bleeding: Secondary | ICD-10-CM | POA: Diagnosis not present

## 2023-06-15 DIAGNOSIS — K219 Gastro-esophageal reflux disease without esophagitis: Secondary | ICD-10-CM | POA: Diagnosis not present

## 2023-06-15 DIAGNOSIS — M4712 Other spondylosis with myelopathy, cervical region: Secondary | ICD-10-CM | POA: Diagnosis not present

## 2023-06-15 DIAGNOSIS — M80062D Age-related osteoporosis with current pathological fracture, left lower leg, subsequent encounter for fracture with routine healing: Secondary | ICD-10-CM | POA: Diagnosis not present

## 2023-06-15 DIAGNOSIS — M4722 Other spondylosis with radiculopathy, cervical region: Secondary | ICD-10-CM | POA: Diagnosis not present

## 2023-06-15 DIAGNOSIS — E785 Hyperlipidemia, unspecified: Secondary | ICD-10-CM | POA: Diagnosis not present

## 2023-06-15 DIAGNOSIS — I10 Essential (primary) hypertension: Secondary | ICD-10-CM | POA: Diagnosis not present

## 2023-06-15 DIAGNOSIS — K589 Irritable bowel syndrome without diarrhea: Secondary | ICD-10-CM | POA: Diagnosis not present

## 2023-06-15 DIAGNOSIS — F339 Major depressive disorder, recurrent, unspecified: Secondary | ICD-10-CM | POA: Diagnosis not present

## 2023-06-16 DIAGNOSIS — K219 Gastro-esophageal reflux disease without esophagitis: Secondary | ICD-10-CM | POA: Diagnosis not present

## 2023-06-16 DIAGNOSIS — M80062D Age-related osteoporosis with current pathological fracture, left lower leg, subsequent encounter for fracture with routine healing: Secondary | ICD-10-CM | POA: Diagnosis not present

## 2023-06-16 DIAGNOSIS — F339 Major depressive disorder, recurrent, unspecified: Secondary | ICD-10-CM | POA: Diagnosis not present

## 2023-06-16 DIAGNOSIS — I10 Essential (primary) hypertension: Secondary | ICD-10-CM | POA: Diagnosis not present

## 2023-06-16 DIAGNOSIS — K589 Irritable bowel syndrome without diarrhea: Secondary | ICD-10-CM | POA: Diagnosis not present

## 2023-06-16 DIAGNOSIS — K5730 Diverticulosis of large intestine without perforation or abscess without bleeding: Secondary | ICD-10-CM | POA: Diagnosis not present

## 2023-06-16 DIAGNOSIS — M4722 Other spondylosis with radiculopathy, cervical region: Secondary | ICD-10-CM | POA: Diagnosis not present

## 2023-06-16 DIAGNOSIS — E785 Hyperlipidemia, unspecified: Secondary | ICD-10-CM | POA: Diagnosis not present

## 2023-06-16 DIAGNOSIS — M4712 Other spondylosis with myelopathy, cervical region: Secondary | ICD-10-CM | POA: Diagnosis not present

## 2023-06-17 DIAGNOSIS — M4712 Other spondylosis with myelopathy, cervical region: Secondary | ICD-10-CM | POA: Diagnosis not present

## 2023-06-17 DIAGNOSIS — K5730 Diverticulosis of large intestine without perforation or abscess without bleeding: Secondary | ICD-10-CM | POA: Diagnosis not present

## 2023-06-17 DIAGNOSIS — I10 Essential (primary) hypertension: Secondary | ICD-10-CM | POA: Diagnosis not present

## 2023-06-17 DIAGNOSIS — F339 Major depressive disorder, recurrent, unspecified: Secondary | ICD-10-CM | POA: Diagnosis not present

## 2023-06-17 DIAGNOSIS — K219 Gastro-esophageal reflux disease without esophagitis: Secondary | ICD-10-CM | POA: Diagnosis not present

## 2023-06-17 DIAGNOSIS — E785 Hyperlipidemia, unspecified: Secondary | ICD-10-CM | POA: Diagnosis not present

## 2023-06-17 DIAGNOSIS — M4722 Other spondylosis with radiculopathy, cervical region: Secondary | ICD-10-CM | POA: Diagnosis not present

## 2023-06-17 DIAGNOSIS — M80062D Age-related osteoporosis with current pathological fracture, left lower leg, subsequent encounter for fracture with routine healing: Secondary | ICD-10-CM | POA: Diagnosis not present

## 2023-06-17 DIAGNOSIS — K589 Irritable bowel syndrome without diarrhea: Secondary | ICD-10-CM | POA: Diagnosis not present

## 2023-06-22 DIAGNOSIS — M4722 Other spondylosis with radiculopathy, cervical region: Secondary | ICD-10-CM | POA: Diagnosis not present

## 2023-06-22 DIAGNOSIS — K589 Irritable bowel syndrome without diarrhea: Secondary | ICD-10-CM | POA: Diagnosis not present

## 2023-06-22 DIAGNOSIS — E785 Hyperlipidemia, unspecified: Secondary | ICD-10-CM | POA: Diagnosis not present

## 2023-06-22 DIAGNOSIS — M80062D Age-related osteoporosis with current pathological fracture, left lower leg, subsequent encounter for fracture with routine healing: Secondary | ICD-10-CM | POA: Diagnosis not present

## 2023-06-22 DIAGNOSIS — K219 Gastro-esophageal reflux disease without esophagitis: Secondary | ICD-10-CM | POA: Diagnosis not present

## 2023-06-22 DIAGNOSIS — M4712 Other spondylosis with myelopathy, cervical region: Secondary | ICD-10-CM | POA: Diagnosis not present

## 2023-06-22 DIAGNOSIS — F339 Major depressive disorder, recurrent, unspecified: Secondary | ICD-10-CM | POA: Diagnosis not present

## 2023-06-22 DIAGNOSIS — I10 Essential (primary) hypertension: Secondary | ICD-10-CM | POA: Diagnosis not present

## 2023-06-22 DIAGNOSIS — K5730 Diverticulosis of large intestine without perforation or abscess without bleeding: Secondary | ICD-10-CM | POA: Diagnosis not present

## 2023-06-23 DIAGNOSIS — F339 Major depressive disorder, recurrent, unspecified: Secondary | ICD-10-CM | POA: Diagnosis not present

## 2023-06-23 DIAGNOSIS — E785 Hyperlipidemia, unspecified: Secondary | ICD-10-CM | POA: Diagnosis not present

## 2023-06-23 DIAGNOSIS — M4712 Other spondylosis with myelopathy, cervical region: Secondary | ICD-10-CM | POA: Diagnosis not present

## 2023-06-23 DIAGNOSIS — K589 Irritable bowel syndrome without diarrhea: Secondary | ICD-10-CM | POA: Diagnosis not present

## 2023-06-23 DIAGNOSIS — M4722 Other spondylosis with radiculopathy, cervical region: Secondary | ICD-10-CM | POA: Diagnosis not present

## 2023-06-23 DIAGNOSIS — I10 Essential (primary) hypertension: Secondary | ICD-10-CM | POA: Diagnosis not present

## 2023-06-23 DIAGNOSIS — M80062D Age-related osteoporosis with current pathological fracture, left lower leg, subsequent encounter for fracture with routine healing: Secondary | ICD-10-CM | POA: Diagnosis not present

## 2023-06-23 DIAGNOSIS — K219 Gastro-esophageal reflux disease without esophagitis: Secondary | ICD-10-CM | POA: Diagnosis not present

## 2023-06-23 DIAGNOSIS — K5730 Diverticulosis of large intestine without perforation or abscess without bleeding: Secondary | ICD-10-CM | POA: Diagnosis not present

## 2023-06-24 ENCOUNTER — Telehealth: Payer: Self-pay | Admitting: Family Medicine

## 2023-06-24 DIAGNOSIS — K219 Gastro-esophageal reflux disease without esophagitis: Secondary | ICD-10-CM | POA: Diagnosis not present

## 2023-06-24 DIAGNOSIS — E785 Hyperlipidemia, unspecified: Secondary | ICD-10-CM | POA: Diagnosis not present

## 2023-06-24 DIAGNOSIS — K5730 Diverticulosis of large intestine without perforation or abscess without bleeding: Secondary | ICD-10-CM | POA: Diagnosis not present

## 2023-06-24 DIAGNOSIS — M80062D Age-related osteoporosis with current pathological fracture, left lower leg, subsequent encounter for fracture with routine healing: Secondary | ICD-10-CM | POA: Diagnosis not present

## 2023-06-24 DIAGNOSIS — M4712 Other spondylosis with myelopathy, cervical region: Secondary | ICD-10-CM | POA: Diagnosis not present

## 2023-06-24 DIAGNOSIS — F339 Major depressive disorder, recurrent, unspecified: Secondary | ICD-10-CM | POA: Diagnosis not present

## 2023-06-24 DIAGNOSIS — I10 Essential (primary) hypertension: Secondary | ICD-10-CM | POA: Diagnosis not present

## 2023-06-24 DIAGNOSIS — K589 Irritable bowel syndrome without diarrhea: Secondary | ICD-10-CM | POA: Diagnosis not present

## 2023-06-24 DIAGNOSIS — M4722 Other spondylosis with radiculopathy, cervical region: Secondary | ICD-10-CM | POA: Diagnosis not present

## 2023-06-24 NOTE — Telephone Encounter (Signed)
Copied from CRM [id number]. Topic: Medicare AWV >> Jun 24, 2023  3:30 PM Payton Doughty wrote: Reason for CRM: LM 06/24/2023 to schedule AWV   Verlee Rossetti; Care Guide Ambulatory Clinical Support  l [facility] Direct Dial: [phone]

## 2023-06-29 DIAGNOSIS — M4712 Other spondylosis with myelopathy, cervical region: Secondary | ICD-10-CM | POA: Diagnosis not present

## 2023-06-29 DIAGNOSIS — M80062D Age-related osteoporosis with current pathological fracture, left lower leg, subsequent encounter for fracture with routine healing: Secondary | ICD-10-CM | POA: Diagnosis not present

## 2023-06-29 DIAGNOSIS — I10 Essential (primary) hypertension: Secondary | ICD-10-CM | POA: Diagnosis not present

## 2023-06-29 DIAGNOSIS — K589 Irritable bowel syndrome without diarrhea: Secondary | ICD-10-CM | POA: Diagnosis not present

## 2023-06-29 DIAGNOSIS — M4722 Other spondylosis with radiculopathy, cervical region: Secondary | ICD-10-CM | POA: Diagnosis not present

## 2023-06-29 DIAGNOSIS — E785 Hyperlipidemia, unspecified: Secondary | ICD-10-CM | POA: Diagnosis not present

## 2023-06-29 DIAGNOSIS — K219 Gastro-esophageal reflux disease without esophagitis: Secondary | ICD-10-CM | POA: Diagnosis not present

## 2023-06-29 DIAGNOSIS — F339 Major depressive disorder, recurrent, unspecified: Secondary | ICD-10-CM | POA: Diagnosis not present

## 2023-06-29 DIAGNOSIS — K5730 Diverticulosis of large intestine without perforation or abscess without bleeding: Secondary | ICD-10-CM | POA: Diagnosis not present

## 2023-06-29 NOTE — Progress Notes (Signed)
[facility] Quality Team Note  Name: Cindy Velasquez Date of Birth: [birth year] MRN: [id number] Date: 06/29/2023  [facility] Quality Team has reviewed this patient's chart, please see recommendations below:  [facility] Quality Other; (OSTEOPOROSIS MANAGEMENT IN WOMEN: PATIENT DUE FOR BONE SCAN FOLLOWING FRACTURE. DEADLINE IS 10/14/2023. PLEASE ADDRESS AT [facility]PCOMING [facility] VISIT)

## 2023-07-01 ENCOUNTER — Ambulatory Visit (INDEPENDENT_AMBULATORY_CARE_PROVIDER_SITE_OTHER): Payer: Medicare HMO | Admitting: *Deleted

## 2023-07-01 DIAGNOSIS — Z0000 Encounter for general adult medical examination without abnormal findings: Secondary | ICD-10-CM

## 2023-07-01 NOTE — Progress Notes (Signed)
Subjective:   Cindy Velasquez is a 82 y.o. female who presents for Medicare Annual (Subsequent) preventive examination.  Visit Complete: Virtual  I connected with  Cindy Velasquez on 07/01/23 by a audio enabled telemedicine application and verified that I am speaking with the correct person using two identifiers.  Patient Location: Home  Provider Location: Office/Clinic  I discussed the limitations of evaluation and management by telemedicine. The patient expressed understanding and agreed to proceed.   Review of Systems     Cardiac Risk Factors include: advanced age (>46men, >47 women);dyslipidemia;hypertension;obesity (BMI >30kg/m2)     Objective:    Per patient no change in vitals since last visit; unable to obtain new vitals due to this being a telehealth visit.     07/01/2023   11:01 AM 04/19/2023   10:26 AM 04/17/2023    8:04 PM 10/12/2022    1:43 PM 09/15/2022   11:03 AM 09/08/2022   10:58 AM 02/24/2022    5:10 PM  Advanced Directives  Does Patient Have a Medical Advance Directive? Yes No No Yes Yes Yes No  Type of Estate agent of [place];Living will    Healthcare Power of Attorney    Does patient want to make changes to medical advance directive? No - Patient declined    No - Patient declined    Copy of Healthcare Power of Attorney in Chart? Yes - validated most recent copy scanned in chart (See row information)        Would patient like information on creating a medical advance directive?  Yes (ED - Information included in AVS) No - Patient declined    No - Patient declined    Current Medications (verified) Outpatient Encounter Medications as of 07/01/2023  Medication Sig   Cholecalciferol (VITAMIN D) 50 MCG (2000 UT) tablet Take 2,000 Units by mouth daily.   clotrimazole-betamethasone (LOTRISONE) cream Apply 1 application topically 2 (two) times daily. Use as needed for rash (Patient taking differently: Apply 1 application  topically 2 (two) times  daily as needed (rash).)   escitalopram (LEXAPRO) 10 MG tablet TAKE 1 TABLET(10 MG) BY MOUTH DAILY (Patient taking differently: Take 10 mg by mouth at bedtime.)   fluticasone (FLONASE) 50 MCG/ACT nasal spray Place 2 sprays into both nostrils daily as needed for allergies.   hydrochlorothiazide (HYDRODIURIL) 25 MG tablet TAKE 1/2 TABLET(12.5 MG) BY MOUTH EVERY MORNING   Lactobacillus-Inulin (CULTURELLE DIGESTIVE DAILY) CAPS Take 1 capsule by mouth daily.   lisinopril (ZESTRIL) 20 MG tablet TAKE 1 TABLET BY MOUTH DAILY   Multiple Vitamins-Minerals (ICAPS AREDS 2) CAPS Take 1 capsule by mouth in the morning and at bedtime.   Olopatadine HCl (PATADAY) 0.2 % SOLN 1 gtt each eye qd (Patient taking differently: Place 1 drop into both eyes daily.)   omeprazole (PRILOSEC) 20 MG capsule Take 1 capsule (20 mg total) by mouth daily.   ondansetron (ZOFRAN-ODT) 4 MG disintegrating tablet Take 1 tablet (4 mg total) by mouth every 8 (eight) hours as needed for nausea or vomiting.   OVER THE COUNTER MEDICATION Apply 1 Application topically daily as needed (pain). THC pain relieving cream   oxyCODONE-acetaminophen (PERCOCET/ROXICET) 5-325 MG tablet Take 1-2 tablets by mouth every 8 (eight) hours as needed for severe pain.   rosuvastatin (CRESTOR) 20 MG tablet TAKE 1 TABLET(20 MG) BY MOUTH DAILY   Wheat Dextrin (BENEFIBER) POWD Take 1 Dose by mouth daily. 1 dose = 2 teaspoons   No facility-administered encounter medications on file  as of 07/01/2023.    Allergies (verified) Lactose intolerance (gi)   History: Past Medical History:  Diagnosis Date   Adenomatous colon polyp    Cancer (HCC) 03/2015   BCC R tibia    Difficult intubation    Diverticulosis of colon    GERD (gastroesophageal reflux disease)    Hip pain    Hyperlipidemia    Hyperplastic colon polyp    Hypertension    Lactose intolerance    Loose stools    Lower back pain    Osteoporosis    Palpitations    Past Surgical History:   Procedure Laterality Date   ANTERIOR CERVICAL DECOMP/DISCECTOMY FUSION N/A 09/11/2022   Procedure: CERVICAL FIVE-SIX ANTERIOR CERVICAL DECOMPRESSION/DISCECTOMY FUSION;  Surgeon: Bedelia Person, MD;  Location: [facility] OR;  Service: Neurosurgery;  Laterality: N/A;   BACK SURGERY     EYE SURGERY  12/08/2010   B/L 01/2011  02/2011   HAMMER TOE SURGERY     Bilateral   ORIF PATELLA Left 04/23/2023   Procedure: OPEN REDUCTION INTERNAL FIXATION (ORIF) LEFT PATELLA;  Surgeon: Tarry Kos, MD;  Location: [facility] OR;  Service: Orthopedics;  Laterality: Left;   ROTATOR CUFF REPAIR     Left   TUBAL LIGATION     Family History  Problem Relation Age of Onset   Hypertension Mother    Cancer Mother 64       ?   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  Tobacco Use   Smoking status: Former    Current packs/day: 0.00    Average packs/day: 0.5 packs/day for 30.0 years (15.0 ttl pk-yrs)    Types: Cigarettes    Start date: 06/01/1961    Quit date: 06/02/1991    Years since quitting: 32.1   Smokeless tobacco: Never  Vaping Use   Vaping status: Never Used  Substance and Sexual Activity   Alcohol use: Yes    Alcohol/week: 2.0 standard drinks of alcohol    Types: 2 Glasses of wine per week   Drug use: No   Sexual activity: Not Currently    Partners: Male  Other Topics Concern   Not on file  Social History Narrative   Exercise--  [employer]--yoga and cardio 3x a week       Right Handed    Lives in a one story home    Social Determinants of Health   Financial Resource Strain: Low Risk  (07/01/2023)   Overall Financial Resource Strain (CARDIA)    Difficulty of Paying Living Expenses: Not hard at all  Food Insecurity: No Food Insecurity (07/01/2023)   Hunger Vital Sign    Worried About Running Out of Food in the Last Year: Never true    Ran Out of Food in the Last Year: Never true  Transportation Needs:  No Transportation Needs (07/01/2023)   PRAPARE - Administrator, Civil Service (Medical): No    Lack of Transportation (Non-Medical): No  Physical Activity: Insufficiently Active (07/01/2023)   Exercise Vital Sign    Days of Exercise per Week: 2 days    Minutes of Exercise per Session: 60 min  Stress: Stress Concern Present (07/01/2023)   [employer] of Occupational Health - Occupational Stress Questionnaire    Feeling of Stress : To some extent  Social Connections: Moderately Integrated (07/01/2023)   Social Connection and Isolation Panel [NHANES]    Frequency of Communication with Friends and Family: More than three times a week    Frequency of Social Gatherings with Friends and Family: Three times a week    Attends Religious Services: More than 4 times per year    Active Member of Clubs or Organizations: No    Attends Banker Meetings: Never    Marital Status: Married    Tobacco Counseling Counseling given: Not Answered   Clinical Intake:  Pre-visit preparation completed: Yes  Pain : No/denies pain  Nutritional Risks: None Diabetes: No  How often do you need to have someone help you when you read instructions, pamphlets, or other written materials from your doctor or pharmacy?: 1 - Never  Interpreter Needed?: No  Information entered by :: Donne Anon, CMA   Activities of Daily Living    07/01/2023   11:01 AM 04/23/2023   11:20 AM  In your present state of health, do you have any difficulty  performing the following activities:  Hearing? 0   Vision? 1   Difficulty concentrating or making decisions? 0   Walking or climbing stairs? 0   Dressing or bathing? 0   Doing errands, shopping? 0 0  Preparing Food and eating ? N   Using the Toilet? N   In the past six months, have you accidently leaked urine? N   Do you have problems with loss of bowel control? N   Managing your Medications? N   Managing your Finances? N   Housekeeping or managing your Housekeeping? N     Patient Care Team: Zola Button, Grayling Congress, DO as PCP - General Ernesto Rutherford, MD as Consulting Physician (Ophthalmology) Hilarie Fredrickson, MD as Consulting Physician (Gastroenterology) Day, Dannielle Karvonen, [facility] (Inactive) as Pharmacist (Pharmacist) Glendale Chard, DO as Consulting Physician (Neurology)  Indicate any recent Medical Services you may have received from other than [facility] providers in the past year (date may be approximate).     Assessment:   This is a routine wellness examination for Parks.  Hearing/Vision screen No results found.  Dietary issues and exercise activities discussed:     Goals Addressed   None    Depression Screen    07/01/2023   11:11 AM 01/27/2023    5:48 PM 11/24/2022    1:10 PM 08/29/2022    8:22 AM 01/28/2022    1:02 PM 06/04/2021    1:04 PM 09/24/2020    1:53 PM  PHQ 2/9 Scores  PHQ - 2 Score 0 1 0 0 0 0 0  PHQ- 9 Score  8         Fall Risk    07/01/2023   11:00 AM 01/05/2023    1:29 PM 11/24/2022    1:10 PM 08/29/2022    8:21 AM 01/28/2022    1:01 PM  Fall Risk   Falls in the past year? 1 0 0 0 0  Number falls  in past yr: 0 0 0 0 0  Injury with Fall? 1 0 0 0 0  Risk for fall due to : History of fall(s)      Follow up Falls evaluation completed Falls evaluation completed Falls evaluation completed Falls evaluation completed Falls prevention discussed    MEDICARE RISK AT HOME:  Medicare Risk at Home - 07/01/23 1103     Any stairs in or around the home? No    If so,  are there any without handrails? No    Home free of loose throw rugs in walkways, pet beds, electrical cords, etc? Yes    Adequate lighting in your home to reduce risk of falls? Yes    Life alert? No    Use of a cane, walker or w/c? No    Grab bars in the bathroom? Yes    Shower chair or bench in shower? Yes    Elevated toilet seat or a handicapped toilet? Yes   comfort height            TIMED UP AND GO:  Was the test performed?  No    Cognitive Function:    08/05/2016    9:52 AM  MMSE - Mini Mental State Exam  Orientation to time 5  Orientation to Place 5  Registration 3  Attention/ Calculation 5  Recall 3  Language- name 2 objects 2  Language- repeat 1  Language- follow 3 step command 3  Language- read & follow direction 1  Write a sentence 1  Copy design 0  Total score 29        07/01/2023   11:12 AM  6CIT Screen  What Year? 0 points  What month? 0 points  What time? 0 points  Count back from 20 0 points  Months in reverse 0 points  Repeat phrase 0 points  Total Score 0 points    Immunizations Immunization History  Administered Date(s) Administered   COVID-19, mRNA, vaccine(Comirnaty)12 years and older 12/26/2022   Fluad Quad(high Dose 65+) 09/24/2020, 09/23/2021, 12/26/2022   H1N1 12/28/2008   Influenza Whole 10/20/2007, 10/18/2008, 11/15/2012   Influenza, High Dose Seasonal PF 09/03/2018   Influenza-Unspecified 12/20/2013, 11/16/2016   PFIZER(Purple Top)SARS-COV-2 Vaccination 12/19/2019, 01/08/2020, 09/28/2020   Pfizer Covid-19 Vaccine Bivalent Booster 30yrs & up 09/23/2021   Pneumococcal Conjugate-13 04/23/2015   Pneumococcal Polysaccharide-23 12/28/2008   Td 06/04/2021   Tdap 08/01/2013   Zoster Recombinant(Shingrix) 12/10/2020, 03/25/2021    TDAP status: Up to date  Flu Vaccine status: Up to date  Pneumococcal vaccine status: Up to date  Covid-19 vaccine status: Information provided on how to obtain vaccines.   Qualifies for Shingles  Vaccine? Yes   Zostavax completed No   Shingrix Completed?: Yes  Screening Tests Health Maintenance  Topic Date Due   MAMMOGRAM  09/10/2022   Medicare Annual Wellness (AWV)  01/28/2023   COVID-19 Vaccine (6 - 2023-24 season) 04/26/2023   DEXA SCAN  05/31/2024 (Originally 06/17/2013)   INFLUENZA VACCINE  07/09/2023   DTaP/Tdap/Td (3 - Td or Tdap) 06/05/2031   Pneumonia Vaccine 15+ Years old  Completed   Zoster Vaccines- Shingrix  Completed   HPV VACCINES  Aged Out   Hepatitis C Screening  Discontinued    Health Maintenance  Health Maintenance Due  Topic Date Due   MAMMOGRAM  09/10/2022   Medicare Annual Wellness (AWV)  01/28/2023   COVID-19 Vaccine (6 - 2023-24 season) 04/26/2023    Colorectal cancer screening: No longer required.   Mammogram  status: Completed 09/10/21. Repeat every year  Bone Density status: pt declined.  Lung Cancer Screening: (Low Dose CT Chest recommended if Age 71-80 years, 20 pack-year currently smoking OR have quit w/in 15years.) does not qualify.   Additional Screening:  Hepatitis C Screening: does not qualify  Vision Screening: Recommended annual ophthalmology exams for early detection of glaucoma and other disorders of the eye. Is the patient up to date with their annual eye exam?  Yes  Who is the provider or what is the name of the office in which the patient attends annual eye exams? [facility] If pt is not established with a provider, would they like to be referred to a provider to establish care? No .   Dental Screening: Recommended annual dental exams for proper oral hygiene  Diabetic Foot Exam: N/a  Community Resource Referral / Chronic Care Management: CRR required this visit?  No   CCM required this visit?  No     Plan:     I have personally reviewed and noted the following in the patient's chart:   Medical and social history Use of alcohol, tobacco or illicit drugs  Current medications and supplements including opioid  prescriptions. Patient is currently taking opioid prescriptions. Information provided to patient regarding non-opioid alternatives. Patient advised to discuss non-opioid treatment plan with their provider. Functional ability and status Nutritional status Physical activity Advanced directives List of other physicians Hospitalizations, surgeries, and ER visits in previous 12 months Vitals Screenings to include cognitive, depression, and falls Referrals and appointments  In addition, I have reviewed and discussed with patient certain preventive protocols, quality metrics, and best practice recommendations. A written personalized care plan for preventive services as well as general preventive health recommendations were provided to patient.     Donne Anon, CMA   07/01/2023   After Visit Summary: (MyChart) Due to this being a telephonic visit, the after visit summary with patients personalized plan was offered to patient via MyChart   Nurse Notes: None

## 2023-07-01 NOTE — Patient Instructions (Signed)
Cindy Velasquez , Thank you for taking time to come for your Medicare Wellness Visit. I appreciate your ongoing commitment to your health goals. Please review the following plan we discussed and let me know if I can assist you in the future.     This is a list of the screening recommended for you and due dates:  Health Maintenance  Topic Date Due   Mammogram  09/10/2022   COVID-19 Vaccine (6 - 2023-24 season) 04/26/2023   DEXA scan (bone density measurement)  05/31/2024*   Flu Shot  07/09/2023   Medicare Annual Wellness Visit  06/30/2024   DTaP/Tdap/Td vaccine (3 - Td or Tdap) 06/05/2031   Pneumonia Vaccine  Completed   Zoster (Shingles) Vaccine  Completed   HPV Vaccine  Aged Out   Hepatitis C Screening  Discontinued  *Topic was postponed. The date shown is not the original due date.    Next appointment: Follow up in one year for your annual wellness visit.   Preventive Care 82 Years and Older, Female Preventive care refers to lifestyle choices and visits with your health care provider that can promote health and wellness. What does preventive care include? A yearly physical exam. This is also called an annual well check. Dental exams once or twice a year. Routine eye exams. Ask your health care provider how often you should have your eyes checked. Personal lifestyle choices, including: Daily care of your teeth and gums. Regular physical activity. Eating a healthy diet. Avoiding tobacco and drug use. Limiting alcohol use. Practicing safe sex. Taking low-dose aspirin every day. Taking vitamin and mineral supplements as recommended by your health care provider. What happens during an annual well check? The services and screenings done by your health care provider during your annual well check will depend on your age, overall health, lifestyle risk factors, and family history of disease. Counseling  Your health care provider may ask you questions about your: Alcohol use. Tobacco  use. Drug use. Emotional well-being. Home and relationship well-being. Sexual activity. Eating habits. History of falls. Memory and ability to understand (cognition). Work and work Astronomer. Reproductive health. Screening  You may have the following tests or measurements: Height, weight, and BMI. Blood pressure. Lipid and cholesterol levels. These may be checked every 5 years, or more frequently if you are over 18 years old. Skin check. Lung cancer screening. You may have this screening every year starting at age 62 if you have a 30-pack-year history of smoking and currently smoke or have quit within the past 15 years. Fecal occult blood test (FOBT) of the stool. You may have this test every year starting at age 56. Flexible sigmoidoscopy or colonoscopy. You may have a sigmoidoscopy every 5 years or a colonoscopy every 10 years starting at age 35. Hepatitis C blood test. Hepatitis B blood test. Sexually transmitted disease (STD) testing. Diabetes screening. This is done by checking your blood sugar (glucose) after you have not eaten for a while (fasting). You may have this done every 1-3 years. Bone density scan. This is done to screen for osteoporosis. You may have this done starting at age 78. Mammogram. This may be done every 1-2 years. Talk to your health care provider about how often you should have regular mammograms. Talk with your health care provider about your test results, treatment options, and if necessary, the need for more tests. Vaccines  Your health care provider may recommend certain vaccines, such as: Influenza vaccine. This is recommended every year. Tetanus, diphtheria,  and acellular pertussis (Tdap, Td) vaccine. You may need a Td booster every 10 years. Zoster vaccine. You may need this after age 72. Pneumococcal 13-valent conjugate (PCV13) vaccine. One dose is recommended after age 79. Pneumococcal polysaccharide (PPSV23) vaccine. One dose is recommended  after age 13. Talk to your health care provider about which screenings and vaccines you need and how often you need them. This information is not intended to replace advice given to you by your health care provider. Make sure you discuss any questions you have with your health care provider. Document Released: 12/21/2015 Document Revised: 08/13/2016 Document Reviewed: 09/25/2015 Elsevier Interactive Patient Education  2017 [employer].  Fall Prevention in the Home Falls can cause injuries. They can happen to people of all ages. There are many things you can do to make your home safe and to help prevent falls. What can I do on the outside of my home? Regularly fix the edges of walkways and driveways and fix any cracks. Remove anything that might make you trip as you walk through a door, such as a raised step or threshold. Trim any bushes or trees on the path to your home. Use bright outdoor lighting. Clear any walking paths of anything that might make someone trip, such as rocks or tools. Regularly check to see if handrails are loose or broken. Make sure that both sides of any steps have handrails. Any raised decks and porches should have guardrails on the edges. Have any leaves, snow, or ice cleared regularly. Use sand or salt on walking paths during winter. Clean up any spills in your garage right away. This includes oil or grease spills. What can I do in the bathroom? Use night lights. Install grab bars by the toilet and in the tub and shower. Do not use towel bars as grab bars. Use non-skid mats or decals in the tub or shower. If you need to sit down in the shower, use a plastic, non-slip stool. Keep the floor dry. Clean up any water that spills on the floor as soon as it happens. Remove soap buildup in the tub or shower regularly. Attach bath mats securely with double-sided non-slip rug tape. Do not have throw rugs and other things on the floor that can make you trip. What can I do  in the bedroom? Use night lights. Make sure that you have a light by your bed that is easy to reach. Do not use any sheets or blankets that are too big for your bed. They should not hang down onto the floor. Have a firm chair that has side arms. You can use this for support while you get dressed. Do not have throw rugs and other things on the floor that can make you trip. What can I do in the kitchen? Clean up any spills right away. Avoid walking on wet floors. Keep items that you use a lot in easy-to-reach places. If you need to reach something above you, use a strong step stool that has a grab bar. Keep electrical cords out of the way. Do not use floor polish or wax that makes floors slippery. If you must use wax, use non-skid floor wax. Do not have throw rugs and other things on the floor that can make you trip. What can I do with my stairs? Do not leave any items on the stairs. Make sure that there are handrails on both sides of the stairs and use them. Fix handrails that are broken or loose. Make sure  that handrails are as long as the stairways. Check any carpeting to make sure that it is firmly attached to the stairs. Fix any carpet that is loose or worn. Avoid having throw rugs at the top or bottom of the stairs. If you do have throw rugs, attach them to the floor with carpet tape. Make sure that you have a light switch at the top of the stairs and the bottom of the stairs. If you do not have them, ask someone to add them for you. What else can I do to help prevent falls? Wear shoes that: Do not have high heels. Have rubber bottoms. Are comfortable and fit you well. Are closed at the toe. Do not wear sandals. If you use a stepladder: Make sure that it is fully opened. Do not climb a closed stepladder. Make sure that both sides of the stepladder are locked into place. Ask someone to hold it for you, if possible. Clearly mark and make sure that you can see: Any grab bars or  handrails. First and last steps. Where the edge of each step is. Use tools that help you move around (mobility aids) if they are needed. These include: Canes. Walkers. Scooters. Crutches. Turn on the lights when you go into a dark area. Replace any light bulbs as soon as they burn out. Set up your furniture so you have a clear path. Avoid moving your furniture around. If any of your floors are uneven, fix them. If there are any pets around you, be aware of where they are. Review your medicines with your doctor. Some medicines can make you feel dizzy. This can increase your chance of falling. Ask your doctor what other things that you can do to help prevent falls. This information is not intended to replace advice given to you by your health care provider. Make sure you discuss any questions you have with your health care provider. Document Released: 09/20/2009 Document Revised: 05/01/2016 Document Reviewed: 12/29/2014 Elsevier Interactive Patient Education  2017 [employer].

## 2023-07-06 DIAGNOSIS — E785 Hyperlipidemia, unspecified: Secondary | ICD-10-CM | POA: Diagnosis not present

## 2023-07-06 DIAGNOSIS — K589 Irritable bowel syndrome without diarrhea: Secondary | ICD-10-CM | POA: Diagnosis not present

## 2023-07-06 DIAGNOSIS — I10 Essential (primary) hypertension: Secondary | ICD-10-CM | POA: Diagnosis not present

## 2023-07-06 DIAGNOSIS — M4722 Other spondylosis with radiculopathy, cervical region: Secondary | ICD-10-CM | POA: Diagnosis not present

## 2023-07-06 DIAGNOSIS — M80062D Age-related osteoporosis with current pathological fracture, left lower leg, subsequent encounter for fracture with routine healing: Secondary | ICD-10-CM | POA: Diagnosis not present

## 2023-07-06 DIAGNOSIS — K5730 Diverticulosis of large intestine without perforation or abscess without bleeding: Secondary | ICD-10-CM | POA: Diagnosis not present

## 2023-07-06 DIAGNOSIS — F339 Major depressive disorder, recurrent, unspecified: Secondary | ICD-10-CM | POA: Diagnosis not present

## 2023-07-06 DIAGNOSIS — M4712 Other spondylosis with myelopathy, cervical region: Secondary | ICD-10-CM | POA: Diagnosis not present

## 2023-07-06 DIAGNOSIS — K219 Gastro-esophageal reflux disease without esophagitis: Secondary | ICD-10-CM | POA: Diagnosis not present

## 2023-07-16 ENCOUNTER — Ambulatory Visit: Payer: Medicare HMO | Admitting: Orthopaedic Surgery

## 2023-07-27 DIAGNOSIS — H0102A Squamous blepharitis right eye, upper and lower eyelids: Secondary | ICD-10-CM | POA: Diagnosis not present

## 2023-07-27 DIAGNOSIS — H0102B Squamous blepharitis left eye, upper and lower eyelids: Secondary | ICD-10-CM | POA: Diagnosis not present

## 2023-07-27 DIAGNOSIS — Z961 Presence of intraocular lens: Secondary | ICD-10-CM | POA: Diagnosis not present

## 2023-07-27 DIAGNOSIS — H10413 Chronic giant papillary conjunctivitis, bilateral: Secondary | ICD-10-CM | POA: Diagnosis not present

## 2023-07-27 DIAGNOSIS — H04223 Epiphora due to insufficient drainage, bilateral lacrimal glands: Secondary | ICD-10-CM | POA: Diagnosis not present

## 2023-07-27 DIAGNOSIS — H43812 Vitreous degeneration, left eye: Secondary | ICD-10-CM | POA: Diagnosis not present

## 2023-07-27 DIAGNOSIS — H353132 Nonexudative age-related macular degeneration, bilateral, intermediate dry stage: Secondary | ICD-10-CM | POA: Diagnosis not present

## 2023-07-29 ENCOUNTER — Ambulatory Visit: Payer: Medicare HMO | Admitting: Orthopaedic Surgery

## 2023-07-29 ENCOUNTER — Other Ambulatory Visit (INDEPENDENT_AMBULATORY_CARE_PROVIDER_SITE_OTHER): Payer: Medicare HMO

## 2023-07-29 ENCOUNTER — Encounter: Payer: Self-pay | Admitting: Orthopaedic Surgery

## 2023-07-29 DIAGNOSIS — S82042A Displaced comminuted fracture of left patella, initial encounter for closed fracture: Secondary | ICD-10-CM | POA: Diagnosis not present
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
## 2023-08-05 ENCOUNTER — Telehealth: Payer: Self-pay | Admitting: Family Medicine

## 2023-08-05 DIAGNOSIS — F439 Reaction to severe stress, unspecified: Secondary | ICD-10-CM

## 2023-08-05 MED ORDER — ESCITALOPRAM OXALATE 10 MG PO TABS
10.0000 mg | ORAL_TABLET | Freq: Every day | ORAL | 1 refills | Status: DC
Start: 2023-08-05 — End: 2024-01-25

## 2023-08-05 NOTE — Addendum Note (Signed)
Addended byConrad [place] D on: 08/05/2023 03:46 PM   Modules accepted: Orders

## 2023-08-05 NOTE — Telephone Encounter (Signed)
Rx sent 

## 2023-08-05 NOTE — Telephone Encounter (Signed)
Prescription Request  08/05/2023  Is this a "Controlled Substance" medicine? No  LOV: 06/01/2023  What is the name of the medication or equipment?   escitalopram (LEXAPRO) 10 MG tablet [[id number]]  Have you contacted your pharmacy to request a refill? Yes   Which pharmacy would you like this sent to?   [id number] [place], [place] - [address] AT [facility] [address] [place] [address] Phone: [phone] Fax: [phone]    Patient notified that their request is being sent to the clinical staff for review and that they should receive a response within 2 business days.   Please advise at Mobile [phone] (mobile)

## 2023-08-13 ENCOUNTER — Encounter (INDEPENDENT_AMBULATORY_CARE_PROVIDER_SITE_OTHER): Payer: Medicare HMO | Admitting: Ophthalmology

## 2023-08-13 DIAGNOSIS — H43813 Vitreous degeneration, bilateral: Secondary | ICD-10-CM

## 2023-08-13 DIAGNOSIS — H35033 Hypertensive retinopathy, bilateral: Secondary | ICD-10-CM

## 2023-08-13 DIAGNOSIS — H353132 Nonexudative age-related macular degeneration, bilateral, intermediate dry stage: Secondary | ICD-10-CM | POA: Diagnosis not present

## 2023-08-13 DIAGNOSIS — I10 Essential (primary) hypertension: Secondary | ICD-10-CM

## 2023-09-11 ENCOUNTER — Other Ambulatory Visit (HOSPITAL_BASED_OUTPATIENT_CLINIC_OR_DEPARTMENT_OTHER): Payer: Self-pay

## 2023-09-11 MED ORDER — FLUAD 0.5 ML IM SUSY
PREFILLED_SYRINGE | INTRAMUSCULAR | 0 refills | Status: DC
  Filled 2023-09-11: qty 0.5, 1d supply, fill #0

## 2023-09-11 MED ORDER — COMIRNATY 30 MCG/0.3ML IM SUSY
PREFILLED_SYRINGE | INTRAMUSCULAR | 0 refills | Status: DC
  Filled 2023-09-11: qty 0.3, 1d supply, fill #0

## 2023-09-19 ENCOUNTER — Other Ambulatory Visit: Payer: Self-pay | Admitting: Family Medicine

## 2023-09-19 DIAGNOSIS — I10 Essential (primary) hypertension: Secondary | ICD-10-CM

## 2023-10-26 ENCOUNTER — Other Ambulatory Visit: Payer: Self-pay | Admitting: Family Medicine

## 2023-11-12 ENCOUNTER — Other Ambulatory Visit: Payer: Self-pay | Admitting: Family Medicine

## 2023-11-19 ENCOUNTER — Ambulatory Visit: Payer: Medicare HMO | Admitting: Family Medicine

## 2023-11-24 ENCOUNTER — Other Ambulatory Visit (HOSPITAL_BASED_OUTPATIENT_CLINIC_OR_DEPARTMENT_OTHER): Payer: Self-pay | Admitting: Family Medicine

## 2023-11-24 ENCOUNTER — Encounter: Payer: Self-pay | Admitting: Family Medicine

## 2023-11-24 ENCOUNTER — Ambulatory Visit (INDEPENDENT_AMBULATORY_CARE_PROVIDER_SITE_OTHER): Payer: Medicare HMO | Admitting: Family Medicine

## 2023-11-24 VITALS — BP 130/60 | HR 76 | Temp 97.8°F | Resp 18 | Ht 59.0 in | Wt 159.4 lb

## 2023-11-24 DIAGNOSIS — E559 Vitamin D deficiency, unspecified: Secondary | ICD-10-CM | POA: Diagnosis not present

## 2023-11-24 DIAGNOSIS — E785 Hyperlipidemia, unspecified: Secondary | ICD-10-CM

## 2023-11-24 DIAGNOSIS — R1013 Epigastric pain: Secondary | ICD-10-CM | POA: Diagnosis not present

## 2023-11-24 DIAGNOSIS — Z139 Encounter for screening, unspecified: Secondary | ICD-10-CM

## 2023-11-24 DIAGNOSIS — I10 Essential (primary) hypertension: Secondary | ICD-10-CM

## 2023-11-24 LAB — LIPID PANEL
Cholesterol: 126 mg/dL (ref 0–200)
HDL: 45.1 mg/dL (ref >39.00)
LDL Cholesterol: 60 mg/dL (ref 0–99)
NonHDL: 81.3
Total CHOL/HDL Ratio: 3
Triglycerides: 109 mg/dL (ref 0.0–149.0)
VLDL: 21.8 mg/dL (ref 0.0–40.0)

## 2023-11-24 LAB — COMPREHENSIVE METABOLIC PANEL
ALT: 38 U/L — ABNORMAL HIGH (ref 0–35)
AST: 35 U/L (ref 0–37)
Albumin: 3.9 g/dL (ref 3.5–5.2)
Alkaline Phosphatase: 120 U/L — ABNORMAL HIGH (ref 39–117)
BUN: 26 mg/dL — ABNORMAL HIGH (ref 6–23)
CO2: 35 meq/L — ABNORMAL HIGH (ref 19–32)
Calcium: 9.7 mg/dL (ref 8.4–10.5)
Chloride: 103 meq/L (ref 96–112)
Creatinine, Ser: 0.72 mg/dL (ref 0.40–1.20)
GFR: 77.91 mL/min (ref >60.00)
Glucose, Bld: 64 mg/dL — ABNORMAL LOW (ref 70–99)
Potassium: 4.6 meq/L (ref 3.5–5.1)
Sodium: 142 meq/L (ref 135–145)
Total Bilirubin: 0.5 mg/dL (ref 0.2–1.2)
Total Protein: 6.9 g/dL (ref 6.0–8.3)

## 2023-11-24 LAB — VITAMIN D 25 HYDROXY (VIT D DEFICIENCY, FRACTURES): VITD: 39.5 ng/mL (ref 30.00–100.00)

## 2023-11-24 MED ORDER — OMEPRAZOLE 20 MG PO CPDR: 20.0000 mg | DELAYED_RELEASE_CAPSULE | Freq: Every day | ORAL | 3 refills | Status: DC

## 2023-11-24 NOTE — Assessment & Plan Note (Signed)
Encourage heart healthy diet such as MIND or DASH diet, increase exercise, avoid trans fats, simple carbohydrates and processed foods, consider a krill or fish or flaxseed oil cap daily.  °

## 2023-11-24 NOTE — Assessment & Plan Note (Signed)
Well controlled, no changes to meds. Encouraged heart healthy diet such as the DASH diet and exercise as tolerated.  °

## 2023-11-24 NOTE — Progress Notes (Signed)
Established Patient Office Visit  Subjective   Patient ID: Cindy Velasquez, female    DOB: 1941-02-14  Age: 82 y.o. MRN: [id number]  Chief Complaint  Patient presents with   Hypertension   Hyperlipidemia   Follow-up    HPI Discussed the use of AI scribe software for clinical note transcription with the patient, who gave verbal consent to proceed.  History of Present Illness   The patient presents for a routine follow-up with a few questions regarding her current medication regimen. She has been taking Benefiber for digestive issues, but is unsure about the need for concurrent use of a probiotic, Culturale Digestive Daily. She has also been prescribed over-the-counter allergy eye drops, which she has been using as needed.  The patient reports a history of loose bowel movements and excessive gas, particularly before bowel movements. She also mentions occasional burping, which she believes may be related to acid reflux. She has been prescribed omeprazole for this in the past, but has not been taking it recently.  The patient has a history of surgery, but it is unclear from the conversation what type of surgery she underwent. She has not reported any new symptoms or changes in her health status since her last visit. She plans to have a mammogram and has declined a bone density test.      Patient Active Problem List   Diagnosis Date Noted   Closed displaced transverse fracture of left patella 04/23/2023   History of open reduction and internal fixation (ORIF) procedure 04/23/2023   Closed displaced comminuted fracture of left patella 04/21/2023   Dyspepsia 01/27/2023   Irritable bowel syndrome with diarrhea 01/05/2023   Stress at home 01/05/2023   Insomnia 01/05/2023   Cervical vertebral fusion 09/11/2022   Degenerative arthritis of cervical spine with cord compression 06/25/2022   Rash 04/29/2022   Pharyngitis 04/29/2022   Viral upper respiratory tract infection 04/29/2022    Onychomycosis 12/31/2021   Burn of left arm, first degree, initial encounter 10/10/2021   Wound of left leg 10/10/2021   Hand abrasion, infected, left, initial encounter 06/05/2021   Thoracic stenosis 07/27/2020   Dyslipidemia 07/27/2020   Prediabetes 12/13/2018   Vitamin D deficiency 12/13/2018   Overweight (BMI 25.0-29.9) 12/13/2018   Preventative health care 09/15/2018   Bilateral cold feet 06/17/2018   Idiopathic peripheral neuropathy 06/17/2018   Lower extremity edema 06/17/2018   Right shoulder pain 08/20/2016   Knee pain, bilateral 04/23/2015   Urticaria 10/13/2014   Neuropathy 10/13/2014   Fatigue 10/13/2014   Abrasion of face 08/01/2013   Rib pain on left side 07/25/2013   Tinea cruris 06/22/2012   Eustachian tube dysfunction 04/07/2012   Cerumen impaction 04/07/2012   Acute upper respiratory infection 10/30/2010   CHANGE IN BOWELS 04/03/2010   FECAL OCCULT BLOOD 04/03/2010   History of colonic polyps 04/03/2010   DYSPEPSIA 03/04/2010   GUAIAC POSITIVE STOOL 03/04/2010   HEMATURIA UNSPECIFIED 06/28/2009   DYSURIA 06/28/2009   HAMMER TOE 12/28/2008   POSTMENOPAUSAL STATUS 12/28/2008   BACK PAIN, LUMBAR 07/11/2008   BENIGN NEOPLASM OF SKIN SITE UNSPECIFIED 02/28/2008   Hyperlipidemia 04/15/2007   Essential hypertension 04/15/2007   DIVERTICULOSIS, COLON 04/15/2007   THUMB PAIN, RIGHT 04/15/2007   Osteoporosis 04/15/2007   Past Medical History:  Diagnosis Date   Adenomatous colon polyp    Cancer (HCC) 03/2015   BCC R tibia    Difficult intubation    Diverticulosis of colon    GERD (gastroesophageal reflux disease)  Hip pain    Hyperlipidemia    Hyperplastic colon polyp    Hypertension    Lactose intolerance    Loose stools    Lower back pain    Osteoporosis    Palpitations    Past Surgical History:  Procedure Laterality Date   ANTERIOR CERVICAL DECOMP/DISCECTOMY FUSION N/A 09/11/2022   Procedure: CERVICAL FIVE-SIX ANTERIOR CERVICAL  DECOMPRESSION/DISCECTOMY FUSION;  Surgeon: Bedelia Person, MD;  Location: [facility] OR;  Service: Neurosurgery;  Laterality: N/A;   BACK SURGERY     EYE SURGERY  12/08/2010   B/L 01/2011  02/2011   HAMMER TOE SURGERY     Bilateral   ORIF PATELLA Left 04/23/2023   Procedure: OPEN REDUCTION INTERNAL FIXATION (ORIF) LEFT PATELLA;  Surgeon: Tarry Kos, MD;  Location: [facility] OR;  Service: Orthopedics;  Laterality: Left;   ROTATOR CUFF REPAIR     Left   TUBAL LIGATION     Social History   Tobacco Use   Smoking status: Former    Current packs/day: 0.00    Average packs/day: 0.5 packs/day for 30.0 years (15.0 ttl pk-yrs)    Types: Cigarettes    Start date: 06/01/1961    Quit date: 06/02/1991    Years since quitting: 32.5   Smokeless tobacco: Never  Vaping Use   Vaping status: Never Used  Substance Use Topics   Alcohol use: Yes    Alcohol/week: 2.0 standard drinks of alcohol    Types: 2 Glasses of wine per week   Drug use: No   Social History   Socioeconomic History   Marital status: Married    Spouse name: Namira Hennesy   Number of children: 4   Years of education: Not on file   Highest education level: Not on file  Occupational History   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  Tobacco Use   Smoking status: Former    Current packs/day: 0.00    Average packs/day: 0.5 packs/day for 30.0 years (15.0 ttl pk-yrs)    Types: Cigarettes    Start date: 06/01/1961    Quit date: 06/02/1991    Years since quitting: 32.5   Smokeless tobacco: Never  Vaping Use   Vaping status: Never Used  Substance and Sexual Activity   Alcohol use: Yes    Alcohol/week: 2.0 standard drinks of alcohol    Types: 2 Glasses of wine per week   Drug use: No   Sexual activity: Not Currently    Partners: Male  Other Topics Concern   Not on file  Social History Narrative   Exercise--  [employer]--yoga and cardio 3x a week      Right Handed    Lives in a one story home    Social Drivers of Health    Financial Resource Strain: Low Risk  (07/01/2023)   Overall Financial Resource Strain (CARDIA)    Difficulty of Paying Living Expenses: Not hard at all  Food Insecurity: No Food Insecurity (07/01/2023)   Hunger Vital Sign    Worried About Running Out of Food in the Last Year: Never true    Ran Out of Food in the Last Year: Never true  Transportation Needs: No Transportation Needs (07/01/2023)   PRAPARE - Administrator, Civil Service (Medical): No    Lack of Transportation (Non-Medical): No  Physical Activity: Insufficiently Active (07/01/2023)   Exercise Vital Sign    Days of Exercise per Week: 2 days    Minutes of Exercise per Session: 60 min  Stress:  Stress Concern Present (07/01/2023)   [employer] of Occupational Health - Occupational Stress Questionnaire    Feeling of Stress : To some extent  Social Connections: Moderately Integrated (07/01/2023)   Social Connection and Isolation Panel [NHANES]    Frequency of Communication with Friends and Family: More than three times a week    Frequency of Social Gatherings with Friends and Family: Three times a week    Attends Religious Services: More than 4 times per year    Active Member of Clubs or Organizations: No    Attends Banker Meetings: Never    Marital Status: Married  Catering manager Violence: Not At Risk (07/01/2023)   Humiliation, Afraid, Rape, and Kick questionnaire    Fear of Current or Ex-Partner: No    Emotionally Abused: No    Physically Abused: No    Sexually Abused: No   Family Status  Relation Name Status   Mother  Deceased at age 58   Father  Deceased at age 2   Brother  Alive   Brother  Alive   Neg Hx  (Not Specified)  No partnership data on file   Family History  Problem Relation Age of Onset   Hypertension Mother    Cancer Mother 89       ?   Hyperlipidemia Mother    Obesity Mother    Hypertension Father    Stroke Father    Heart disease Father    Hypertension  Brother    Testicular cancer Brother 26   Hypertension Brother    Heart disease Brother        quadruple bypass   Colon cancer Neg Hx    Stomach cancer Neg Hx    Throat cancer Neg Hx    Allergies  Allergen Reactions   Lactose Intolerance (Gi)     Upset stomach/ bloating       Review of Systems  Constitutional:  Negative for chills, fever and malaise/fatigue.  HENT:  Negative for congestion and hearing loss.   Eyes:  Negative for blurred vision and discharge.  Respiratory:  Negative for cough, sputum production and shortness of breath.   Cardiovascular:  Negative for chest pain, palpitations and leg swelling.  Gastrointestinal:  Negative for abdominal pain, blood in stool, constipation, diarrhea, heartburn, nausea and vomiting.  Genitourinary:  Negative for dysuria, frequency, hematuria and urgency.  Musculoskeletal:  Negative for back pain, falls and myalgias.  Skin:  Negative for rash.  Neurological:  Negative for dizziness, sensory change, loss of consciousness, weakness and headaches.  Endo/Heme/Allergies:  Negative for environmental allergies. Does not bruise/bleed easily.  Psychiatric/Behavioral:  Negative for depression and suicidal ideas. The patient is not nervous/anxious and does not have insomnia.       Objective:     BP 130/60 (BP Location: Left Arm, Patient Position: Sitting, Cuff Size: Normal)   Pulse 76   Temp 97.8 F (36.6 C) (Oral)   Resp 18   Ht 4\' 11"  (1.499 m)   Wt 159 lb 6.4 oz (72.3 kg)   SpO2 96%   BMI 32.19 kg/m  BP Readings from Last 3 Encounters:  11/24/23 130/60  06/01/23 128/60  04/29/23 (!) 140/77   Wt Readings from Last 3 Encounters:  11/24/23 159 lb 6.4 oz (72.3 kg)  06/01/23 153 lb 12.8 oz (69.8 kg)  04/23/23 150 lb (68 kg)   SpO2 Readings from Last 3 Encounters:  11/24/23 96%  06/01/23 95%  04/29/23 95%  Physical Exam Vitals and nursing note reviewed.  Constitutional:      General: She is not in acute distress.     Appearance: Normal appearance. She is well-developed.  HENT:     Head: Normocephalic and atraumatic.     Right Ear: Tympanic membrane, ear canal and external ear normal. There is no impacted cerumen.     Left Ear: Tympanic membrane, ear canal and external ear normal. There is no impacted cerumen.     Nose: Nose normal.     Mouth/Throat:     Mouth: Mucous membranes are moist.     Pharynx: Oropharynx is clear. No oropharyngeal exudate or posterior oropharyngeal erythema.  Eyes:     General: No scleral icterus.       Right eye: No discharge.        Left eye: No discharge.     Conjunctiva/sclera: Conjunctivae normal.     Pupils: Pupils are equal, round, and reactive to light.  Neck:     Thyroid: No thyromegaly or thyroid tenderness.     Vascular: No JVD.  Cardiovascular:     Rate and Rhythm: Normal rate and regular rhythm.     Heart sounds: Normal heart sounds. No murmur heard. Pulmonary:     Effort: Pulmonary effort is normal. No respiratory distress.     Breath sounds: Normal breath sounds.  Abdominal:     General: Bowel sounds are normal. There is no distension.     Palpations: Abdomen is soft. There is no mass.     Tenderness: There is no abdominal tenderness. There is no guarding or rebound.  Musculoskeletal:        General: Normal range of motion.     Cervical back: Normal range of motion and neck supple.     Right lower leg: No edema.     Left lower leg: No edema.  Lymphadenopathy:     Cervical: No cervical adenopathy.  Skin:    General: Skin is warm and dry.     Findings: No erythema or rash.  Neurological:     Mental Status: She is alert and oriented to person, place, and time.     Cranial Nerves: No cranial nerve deficit.     Deep Tendon Reflexes: Reflexes are normal and symmetric.  Psychiatric:        Mood and Affect: Mood normal.        Behavior: Behavior normal.        Thought Content: Thought content normal.        Judgment: Judgment normal.      No  results found for any visits on 11/24/23.  Last CBC Lab Results  Component Value Date   WBC 5.6 06/01/2023   HGB 13.3 06/01/2023   HCT 41.9 06/01/2023   MCV 88.4 06/01/2023   MCH 28.1 04/19/2023   RDW 16.5 (H) 06/01/2023   PLT 267.0 06/01/2023   Last metabolic panel Lab Results  Component Value Date   GLUCOSE 97 06/01/2023   NA 140 06/01/2023   K 4.2 06/01/2023   CL 100 06/01/2023   CO2 33 (H) 06/01/2023   BUN 17 06/01/2023   CREATININE 0.69 06/01/2023   GFR 81.15 06/01/2023   CALCIUM 10.2 06/01/2023   PROT 7.3 06/01/2023   ALBUMIN 4.0 06/01/2023   LABGLOB 3.1 12/09/2018   AGRATIO 1.4 12/09/2018   BILITOT 0.5 06/01/2023   ALKPHOS 125 (H) 06/01/2023   AST 32 06/01/2023   ALT 36 (H) 06/01/2023   ANIONGAP 12 04/19/2023  Last lipids Lab Results  Component Value Date   CHOL 148 06/01/2023   HDL 45.20 06/01/2023   LDLCALC 85 06/01/2023   LDLDIRECT 129.1 08/23/2007   TRIG 90.0 06/01/2023   CHOLHDL 3 06/01/2023   Last hemoglobin A1c Lab Results  Component Value Date   HGBA1C 5.9 (H) 08/02/2020   Last thyroid functions Lab Results  Component Value Date   TSH 1.59 01/05/2023   T4TOTAL 9.7 09/03/2018   Last vitamin D Lab Results  Component Value Date   VD25OH 48.08 06/01/2023   Last vitamin B12 and Folate Lab Results  Component Value Date   VITAMINB12 534 10/13/2014   FOLATE >20.0 10/13/2014      The ASCVD Risk score (Arnett DK, et al., 2019) failed to calculate for the following reasons:   The 2019 ASCVD risk score is only valid for ages 10 to 65    Assessment & Plan:   Problem List Items Addressed This Visit       Unprioritized   Dyspepsia   Relevant Medications   omeprazole (PRILOSEC) 20 MG capsule   Vitamin D deficiency   Relevant Orders   VITAMIN D 25 Hydroxy (Vit-D Deficiency, Fractures)   Hyperlipidemia   Encourage heart healthy diet such as MIND or DASH diet, increase exercise, avoid trans fats, simple carbohydrates and processed  foods, consider a krill or fish or flaxseed oil cap daily.        Relevant Orders   Comprehensive metabolic panel   Lipid panel   Essential hypertension - Primary   Well controlled, no changes to meds. Encouraged heart healthy diet such as the DASH diet and exercise as tolerated.        Relevant Orders   Comprehensive metabolic panel   Lipid panel  Assessment and Plan    Gastrointestinal Issues   She continues to experience gastrointestinal issues, including loose stools, flatulence, and occasional eructation. Benefiber has been effective for her loose stools, but flatulence remains a concern. A probiotic is being considered to address the flatulence, and omeprazole may be resumed as needed if acid reflux is contributing to her eructation. She will fill her omeprazole prescription at [facility]  General Health Maintenance   She is due for a mammogram and has declined a bone density test. The importance of these screenings has been emphasized to her. She will obtain a mammogram but has declined the bone density test.  Follow-up   She will follow up as needed.        No follow-ups on file.    Donato Schultz, DO

## 2023-12-08 ENCOUNTER — Encounter (HOSPITAL_BASED_OUTPATIENT_CLINIC_OR_DEPARTMENT_OTHER): Payer: Self-pay | Admitting: Family Medicine

## 2023-12-14 ENCOUNTER — Inpatient Hospital Stay (HOSPITAL_BASED_OUTPATIENT_CLINIC_OR_DEPARTMENT_OTHER): Admission: RE | Admit: 2023-12-14 | Payer: Medicare HMO | Source: Ambulatory Visit

## 2023-12-15 ENCOUNTER — Ambulatory Visit: Payer: Medicare HMO | Admitting: Orthopaedic Surgery

## 2023-12-15 ENCOUNTER — Other Ambulatory Visit (INDEPENDENT_AMBULATORY_CARE_PROVIDER_SITE_OTHER): Payer: Medicare HMO

## 2023-12-15 DIAGNOSIS — M25562 Pain in left knee: Secondary | ICD-10-CM | POA: Diagnosis not present

## 2023-12-15 NOTE — Progress Notes (Signed)
 Office Visit Note   Patient: Cindy Velasquez           Date of Birth: July 26, 1941           MRN: [id number] Visit Date: 12/15/2023              Requested by: [address] [place], [place] [address] [place],  [place] [address] PCP: Cindy Velasquez, Cindy SAUNDERS, DO   Assessment & Plan: Visit Diagnoses:  1. Acute pain of left knee     Plan: Cindy Velasquez is an 83 year old female with acute left knee pain.  Impression is sprain versus less likely occult fracture.  She does have a little bit of an effusion.  Her pain is 3 out of 10 at worst.  I recommend symptomatic treatment and activity as tolerated.  Continue wearing the knee brace.  Consider using the cane for ambulation until symptoms improve.  Follow-up in about 4 weeks if she does not feel any improvement.  Follow-Up Instructions: No follow-ups on file.   Orders:  Orders Placed This Encounter  Procedures   XR KNEE 3 VIEW LEFT   No orders of the defined types were placed in this encounter.     Procedures: No procedures performed   Clinical Data: No additional findings.   Subjective: Chief Complaint  Patient presents with   Left Knee - Pain    HPI Cindy Velasquez comes in today for evaluation of left knee pain status post mechanical fall 2 days ago.  She does not feel that she landed on the knee or actually hit the knee on anything.  It happened quickly so she not exactly sure what happened.  She is status post ORIF left patella about 7 to 8 months ago.  She has been wearing a neoprene knee brace which partially helps.  She reports 3 out of 10 pain at worst.  Review of Systems  Constitutional: Negative.   HENT: Negative.    Eyes: Negative.   Respiratory: Negative.    Cardiovascular: Negative.   Endocrine: Negative.   Musculoskeletal: Negative.   Neurological: Negative.   Hematological: Negative.   Psychiatric/Behavioral: Negative.    All other systems reviewed and are negative.    Objective: Vital Signs: There were no  vitals taken for this visit.  Physical Exam Vitals and nursing note reviewed.  Constitutional:      Appearance: She is well-developed.  HENT:     Head: Normocephalic and atraumatic.  Pulmonary:     Effort: Pulmonary effort is normal.  Abdominal:     Palpations: Abdomen is soft.  Musculoskeletal:     Cervical back: Neck supple.  Skin:    General: Skin is warm.     Capillary Refill: Capillary refill takes less than 2 seconds.  Neurological:     Mental Status: She is alert and oriented to person, place, and time.  Psychiatric:        Behavior: Behavior normal.        Thought Content: Thought content normal.        Judgment: Judgment normal.     Ortho Exam Exam of the left knee shows fully healed surgical scar.  Normal range of motion without pain.  She has slight tenderness to the lateral aspect of the knee.  Small joint effusion.  Collaterals and cruciates are stable. Specialty Comments:  No specialty comments available.  Imaging: XR KNEE 3 VIEW LEFT Result Date: 12/15/2023 X-rays of the left knee show prior plate and  screw construct from ORIF of the patella.  No acute abnormalities.  Degenerative changes throughout the knee.    PMFS History: Patient Active Problem List   Diagnosis Date Noted   Closed displaced transverse fracture of left patella 04/23/2023   History of open reduction and internal fixation (ORIF) procedure 04/23/2023   Closed displaced comminuted fracture of left patella 04/21/2023   Dyspepsia 01/27/2023   Irritable bowel syndrome with diarrhea 01/05/2023   Stress at home 01/05/2023   Insomnia 01/05/2023   Cervical vertebral fusion 09/11/2022   Degenerative arthritis of cervical spine with cord compression 06/25/2022   Rash 04/29/2022   Pharyngitis 04/29/2022   Viral upper respiratory tract infection 04/29/2022   Onychomycosis 12/31/2021   Burn of left arm, first degree, initial encounter 10/10/2021   Wound of left leg 10/10/2021   Hand abrasion,  infected, left, initial encounter 06/05/2021   Thoracic stenosis 07/27/2020   Dyslipidemia 07/27/2020   Prediabetes 12/13/2018   Vitamin D  deficiency 12/13/2018   Overweight (BMI 25.0-29.9) 12/13/2018   Preventative health care 09/15/2018   Bilateral cold feet 06/17/2018   Idiopathic peripheral neuropathy 06/17/2018   Lower extremity edema 06/17/2018   Right shoulder pain 08/20/2016   Knee pain, bilateral 04/23/2015   Urticaria 10/13/2014   Neuropathy 10/13/2014   Fatigue 10/13/2014   Abrasion of face 08/01/2013   Rib pain on left side 07/25/2013   Tinea cruris 06/22/2012   Eustachian tube dysfunction 04/07/2012   Cerumen impaction 04/07/2012   Acute upper respiratory infection 10/30/2010   CHANGE IN BOWELS 04/03/2010   FECAL OCCULT BLOOD 04/03/2010   History of colonic polyps 04/03/2010   DYSPEPSIA 03/04/2010   GUAIAC POSITIVE STOOL 03/04/2010   HEMATURIA UNSPECIFIED 06/28/2009   DYSURIA 06/28/2009   HAMMER TOE 12/28/2008   POSTMENOPAUSAL STATUS 12/28/2008   BACK PAIN, LUMBAR 07/11/2008   BENIGN NEOPLASM OF SKIN SITE UNSPECIFIED 02/28/2008   Hyperlipidemia 04/15/2007   Essential hypertension 04/15/2007   DIVERTICULOSIS, COLON 04/15/2007   THUMB PAIN, RIGHT 04/15/2007   Osteoporosis 04/15/2007   Past Medical History:  Diagnosis Date   Adenomatous colon polyp    Cancer (HCC) 03/2015   BCC R tibia    Difficult intubation    Diverticulosis of colon    GERD (gastroesophageal reflux disease)    Hip pain    Hyperlipidemia    Hyperplastic colon polyp    Hypertension    Lactose intolerance    Loose stools    Lower back pain    Osteoporosis    Palpitations     Family History  Problem Relation Age of Onset   Hypertension Mother    Cancer Mother 42       ?   Hyperlipidemia Mother    Obesity Mother    Hypertension Father    Stroke Father    Heart disease Father    Hypertension Brother    Testicular cancer Brother 50   Hypertension Brother    Heart disease  Brother        quadruple bypass   Colon cancer Neg Hx    Stomach cancer Neg Hx    Throat cancer Neg Hx     Past Surgical History:  Procedure Laterality Date   ANTERIOR CERVICAL DECOMP/DISCECTOMY FUSION N/A 09/11/2022   Procedure: CERVICAL FIVE-SIX ANTERIOR CERVICAL DECOMPRESSION/DISCECTOMY FUSION;  Surgeon: Debby Dorn MATSU, MD;  Location: [facility] OR;  Service: Neurosurgery;  Laterality: N/A;   BACK SURGERY     EYE SURGERY  12/08/2010   B/L 01/2011  02/2011  HAMMER TOE SURGERY     Bilateral   ORIF PATELLA Left 04/23/2023   Procedure: OPEN REDUCTION INTERNAL FIXATION (ORIF) LEFT PATELLA;  Surgeon: Jerri Kay HERO, MD;  Location: [facility] OR;  Service: Orthopedics;  Laterality: Left;   ROTATOR CUFF REPAIR     Left   TUBAL LIGATION     Social History   Occupational History   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

## 2023-12-17 ENCOUNTER — Encounter (HOSPITAL_BASED_OUTPATIENT_CLINIC_OR_DEPARTMENT_OTHER): Payer: Self-pay

## 2023-12-17 ENCOUNTER — Ambulatory Visit (HOSPITAL_BASED_OUTPATIENT_CLINIC_OR_DEPARTMENT_OTHER)
Admission: RE | Admit: 2023-12-17 | Discharge: 2023-12-17 | Disposition: A | Discharge status: Home / Self Care | Payer: Medicare HMO | Source: Ambulatory Visit | Attending: Family Medicine | Admitting: Family Medicine

## 2023-12-17 DIAGNOSIS — Z1231 Encounter for screening mammogram for malignant neoplasm of breast: Secondary | ICD-10-CM | POA: Diagnosis not present

## 2023-12-17 DIAGNOSIS — Z139 Encounter for screening, unspecified: Secondary | ICD-10-CM | POA: Diagnosis present

## 2023-12-18 ENCOUNTER — Other Ambulatory Visit: Payer: Self-pay | Admitting: Family Medicine

## 2023-12-18 NOTE — Telephone Encounter (Signed)
 Copied from CRM [id number]. Topic: Clinical - Medication Refill >> Dec 18, 2023 11:38 AM Antonio DEL wrote: Most Recent Primary Care Visit:  Provider: ANTONIO CYNDEE ROCKERS R  Department: [facility]  Visit Type: OFFICE VISIT  Date: 11/24/2023  Medication: Cholecalciferol (VITAMIN D ) 50 MCG (2000 UT  Has the patient contacted their pharmacy? Yes, pharmacy advised patient to have her doctor send over refill request (Agent: If no, request that the patient contact the pharmacy for the refill. If patient does not wish to contact the pharmacy document the reason why and proceed with request.) (Agent: If yes, when and what did the pharmacy advise?)  Is this the correct pharmacy for this prescription? Yes If no, delete pharmacy and type the correct one.  This is the patient's preferred pharmacy:  [facility] [address] [place], [place] - [address] AT [facility] [address] [place] [address] Phone: [phone] Fax: [phone]   Has the prescription been filled recently?   Is the patient out of the medication?   Has the patient been seen for an appointment in the last year OR does the patient have an upcoming appointment?   Can we respond through MyChart?   Agent: Please be advised that Rx refills may take up to 3 business days. We ask that you follow-up with your pharmacy.

## 2023-12-21 ENCOUNTER — Other Ambulatory Visit: Payer: Self-pay | Admitting: Family Medicine

## 2023-12-21 DIAGNOSIS — R928 Other abnormal and inconclusive findings on diagnostic imaging of breast: Secondary | ICD-10-CM

## 2023-12-22 ENCOUNTER — Encounter: Payer: Self-pay | Admitting: Family Medicine

## 2023-12-22 ENCOUNTER — Ambulatory Visit: Payer: Medicare HMO | Admitting: Family Medicine

## 2023-12-22 VITALS — BP 136/72 | HR 74 | Temp 98.1°F | Resp 16 | Ht 59.0 in | Wt 158.0 lb

## 2023-12-22 DIAGNOSIS — D229 Melanocytic nevi, unspecified: Secondary | ICD-10-CM
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
## 2023-12-25 ENCOUNTER — Telehealth: Payer: Self-pay

## 2023-12-25 NOTE — Telephone Encounter (Signed)
Pt called and given Derm phone number

## 2023-12-25 NOTE — Telephone Encounter (Signed)
Copied from CRM [id number]. Topic: Referral - Status >> Dec 25, 2023  9:06 AM Kathryne Eriksson wrote: Reason for CRM: Dermatologist Referral

## 2023-12-28 ENCOUNTER — Ambulatory Visit
Admission: RE | Admit: 2023-12-28 | Discharge: 2023-12-28 | Disposition: A | Discharge status: Home / Self Care | Payer: Medicare HMO | Source: Ambulatory Visit | Attending: Family Medicine | Admitting: Family Medicine

## 2023-12-28 ENCOUNTER — Encounter: Payer: Self-pay | Admitting: Family Medicine

## 2023-12-28 DIAGNOSIS — N6489 Other specified disorders of breast: Secondary | ICD-10-CM | POA: Diagnosis not present

## 2023-12-28 DIAGNOSIS — R928 Other abnormal and inconclusive findings on diagnostic imaging of breast: Secondary | ICD-10-CM

## 2023-12-28 DIAGNOSIS — R92322 Mammographic fibroglandular density, left breast: Secondary | ICD-10-CM | POA: Diagnosis not present

## 2024-01-05 ENCOUNTER — Ambulatory Visit (INDEPENDENT_AMBULATORY_CARE_PROVIDER_SITE_OTHER): Payer: Medicare HMO | Admitting: Dermatology

## 2024-01-05 ENCOUNTER — Encounter: Payer: Self-pay | Admitting: Dermatology

## 2024-01-05 VITALS — BP 151/70 | HR 80

## 2024-01-05 DIAGNOSIS — D485 Neoplasm of uncertain behavior of skin: Secondary | ICD-10-CM

## 2024-01-05 DIAGNOSIS — L720 Epidermal cyst: Secondary | ICD-10-CM

## 2024-01-05 DIAGNOSIS — D492 Neoplasm of unspecified behavior of bone, soft tissue, and skin: Secondary | ICD-10-CM

## 2024-01-05 NOTE — Patient Instructions (Signed)
Important Information  Due to recent changes in healthcare laws, you may see results of your pathology and/or laboratory studies on MyChart before the doctors have had a chance to review them. We understand that in some cases there may be results that are confusing or concerning to you. Please understand that not all results are received at the same time and often the doctors may need to interpret multiple results in order to provide you with the best plan of care or course of treatment. Therefore, we ask that you please give [place] 2 business days to thoroughly review all your results before contacting the office for clarification. Should we see a critical lab result, you will be contacted sooner.   If You Need Anything After Your Visit  If you have any questions or concerns for your doctor, please call our main line at [phone] If no one answers, please leave a voicemail as directed and we will return your call as soon as possible. Messages left after 4 pm will be answered the following business day.   You may also send [place] a message via MyChart. We typically respond to MyChart messages within 1-2 business days.  For prescription refills, please ask your pharmacy to contact our office. Our fax number is [phone].  If you have an urgent issue when the clinic is closed that cannot wait until the next business day, you can page your doctor at the number below.    Please note that while we do our best to be available for urgent issues outside of office hours, we are not available 24/7.   If you have an urgent issue and are unable to reach [place], you may choose to seek medical care at your doctor's office, retail clinic, urgent care center, or emergency room.  If you have a medical emergency, please immediately call 911 or go to the emergency department. In the event of inclement weather, please call our main line at [phone] for an update on the status of any delays or  closures.  Dermatology Medication Tips: Please keep the boxes that topical medications come in in order to help keep track of the instructions about where and how to use these. Pharmacies typically print the medication instructions only on the boxes and not directly on the medication tubes.   If your medication is too expensive, please contact our office at [phone] or send [place] a message through MyChart.   We are unable to tell what your co-pay for medications will be in advance as this is different depending on your insurance coverage. However, we may be able to find a substitute medication at lower cost or fill out paperwork to get insurance to cover a needed medication.   If a prior authorization is required to get your medication covered by your insurance company, please allow [place] 1-2 business days to complete this process.  Drug prices often vary depending on where the prescription is filled and some pharmacies may offer cheaper prices.  The website www.goodrx.com contains coupons for medications through different pharmacies. The prices here do not account for what the cost may be with help from insurance (it may be cheaper with your insurance), but the website can give you the price if you did not use any insurance.  - You can print the associated coupon and take it with your prescription to the pharmacy.  - You may also stop by our office during regular business hours and pick up a GoodRx coupon card.  - If  you need your prescription sent electronically to a different pharmacy, notify our office through [facility] or by phone at [phone]     Patient Handout: Wound Care for Skin Biopsy Site  Taking Care of Your Skin Biopsy Site  Proper care of the biopsy site is essential for promoting healing and minimizing scarring. This handout provides instructions on how to care for your biopsy site to ensure optimal recovery.  1. Cleaning the Wound:  Clean the biopsy site daily  with gentle soap and water. Gently pat the area dry with a clean, soft towel. Avoid harsh scrubbing or rubbing the area, as this can irritate the skin and delay healing.  2. Applying Aquaphor and Bandage:  After cleaning the wound, apply a thin layer of Aquaphor ointment to the biopsy site. Cover the area with a sterile bandage to protect it from dirt, bacteria, and friction. Change the bandage daily or as needed if it becomes soiled or wet.  3. Continued Care for One Week:  Repeat the cleaning, Aquaphor application, and bandaging process daily for one week following the biopsy procedure. Keeping the wound clean and moist during this initial healing period will help prevent infection and promote optimal healing.  4. Massaging Aquaphor into the Area:  ---After one week, discontinue the use of bandages but continue to apply Aquaphor to the biopsy site. ----Gently massage the Aquaphor into the area using circular motions. ---Massaging the skin helps to promote circulation and prevent the formation of scar tissue.   Additional Tips:  Avoid exposing the biopsy site to direct sunlight during the healing process, as this can cause hyperpigmentation or worsen scarring. If you experience any signs of infection, such as increased redness, swelling, warmth, or drainage from the wound, contact your healthcare provider immediately. Follow any additional instructions provided by your healthcare provider for caring for the biopsy site and managing any discomfort. Conclusion:  Taking proper care of your skin biopsy site is crucial for ensuring optimal healing and minimizing scarring. By following these instructions for cleaning, applying Aquaphor, and massaging the area, you can promote a smooth and successful recovery. If you have any questions or concerns about caring for your biopsy site, don't hesitate to contact your healthcare provider for guidance.

## 2024-01-05 NOTE — Progress Notes (Signed)
   New Patient Visit   Subjective  Cindy Velasquez is a 83 y.o. female who presents for the following: concerned about a spot. No hx or family hx of skin cancer.  She has a lesion on her breast, present for several years, and she feels the lesions changed after she had a mammogram. Lesion has not been previously treated and is not painful.  She is accompanied by her husband.  The following portions of the chart were reviewed this encounter and updated as appropriate: medications, allergies, medical history  Review of Systems:  No other skin or systemic complaints except as noted in HPI or Assessment and Plan.  Objective  Well appearing patient in no apparent distress; mood and affect are within normal limits.   A focused examination was performed of the following areas:  Left breast  Relevant exam findings are noted in the Assessment and Plan.    Assessment & Plan    NEOPLASM OF UNCERTAIN BEHAVIOR OF SKIN Left Breast Skin / nail biopsy Type of biopsy: tangential   Informed consent: discussed and consent obtained   Timeout: patient name, date of birth, surgical site, and procedure verified   Procedure prep:  Patient was prepped and draped in usual sterile fashion Prep type:  Isopropyl alcohol Anesthesia: the lesion was anesthetized in a standard fashion   Anesthetic:  1% lidocaine w/ epinephrine 1-100,000 buffered w/ 8.4% NaHCO3 Instrument used: DermaBlade   Hemostasis achieved with: aluminum chloride   Outcome: patient tolerated procedure well   Post-procedure details: sterile dressing applied and wound care instructions given   Dressing type: petrolatum and bandage   Specimen 1 - Surgical pathology Differential Diagnosis: r/o nmsc vs mm vs EIC  Check Margins: No  Return if symptoms worsen or fail to improve.  Dominga Ferry, Surg Tech III, am acting as scribe for Gwenith Daily, MD.   Documentation: I have reviewed the above documentation for accuracy and  completeness, and I agree with the above.  Gwenith Daily, MD

## 2024-01-06 LAB — SURGICAL PATHOLOGY

## 2024-01-12 ENCOUNTER — Telehealth: Payer: Self-pay

## 2024-01-12 NOTE — Telephone Encounter (Signed)
-----   Message from [facility] sent at 01/12/2024 11:34 AM EST ----- Results: Lesion on left breast results showed EIC. Reassured patient on benign nature. OK to follow up in 6-12 months  Communication: MyChart

## 2024-01-12 NOTE — Telephone Encounter (Signed)
Spoke with pt gave her bx results and recommendations

## 2024-01-21 ENCOUNTER — Encounter (INDEPENDENT_AMBULATORY_CARE_PROVIDER_SITE_OTHER): Payer: Medicare HMO | Admitting: Ophthalmology

## 2024-01-21 DIAGNOSIS — H43813 Vitreous degeneration, bilateral: Secondary | ICD-10-CM | POA: Diagnosis not present

## 2024-01-21 DIAGNOSIS — I10 Essential (primary) hypertension: Secondary | ICD-10-CM | POA: Diagnosis not present

## 2024-01-21 DIAGNOSIS — H353132 Nonexudative age-related macular degeneration, bilateral, intermediate dry stage: Secondary | ICD-10-CM

## 2024-01-21 DIAGNOSIS — H35033 Hypertensive retinopathy, bilateral: Secondary | ICD-10-CM | POA: Diagnosis not present

## 2024-01-23 ENCOUNTER — Other Ambulatory Visit: Payer: Self-pay | Admitting: Family Medicine

## 2024-01-23 DIAGNOSIS — F439 Reaction to severe stress, unspecified: Secondary | ICD-10-CM

## 2024-01-27 ENCOUNTER — Other Ambulatory Visit: Payer: Self-pay | Admitting: Family Medicine

## 2024-01-27 DIAGNOSIS — I10 Essential (primary) hypertension: Secondary | ICD-10-CM

## 2024-02-15 DIAGNOSIS — Z961 Presence of intraocular lens: Secondary | ICD-10-CM | POA: Diagnosis not present

## 2024-02-15 DIAGNOSIS — H0102A Squamous blepharitis right eye, upper and lower eyelids: Secondary | ICD-10-CM | POA: Diagnosis not present

## 2024-02-15 DIAGNOSIS — H353132 Nonexudative age-related macular degeneration, bilateral, intermediate dry stage: Secondary | ICD-10-CM | POA: Diagnosis not present

## 2024-02-15 DIAGNOSIS — H0102B Squamous blepharitis left eye, upper and lower eyelids: Secondary | ICD-10-CM | POA: Diagnosis not present

## 2024-02-15 DIAGNOSIS — H02422 Myogenic ptosis of left eyelid: Secondary | ICD-10-CM | POA: Diagnosis not present

## 2024-02-15 DIAGNOSIS — H43812 Vitreous degeneration, left eye: Secondary | ICD-10-CM | POA: Diagnosis not present

## 2024-02-15 DIAGNOSIS — H10413 Chronic giant papillary conjunctivitis, bilateral: Secondary | ICD-10-CM | POA: Diagnosis not present

## 2024-02-15 DIAGNOSIS — H04223 Epiphora due to insufficient drainage, bilateral lacrimal glands: Secondary | ICD-10-CM | POA: Diagnosis not present

## 2024-04-04 ENCOUNTER — Ambulatory Visit (INDEPENDENT_AMBULATORY_CARE_PROVIDER_SITE_OTHER): Admitting: Family Medicine

## 2024-04-04 ENCOUNTER — Encounter: Payer: Self-pay | Admitting: Family Medicine

## 2024-04-04 VITALS — BP 122/78 | HR 78 | Temp 97.9°F | Resp 16 | Ht 59.0 in | Wt 161.6 lb

## 2024-04-04 DIAGNOSIS — F439 Reaction to severe stress, unspecified: Secondary | ICD-10-CM

## 2024-04-04 DIAGNOSIS — L609 Nail disorder, unspecified: Secondary | ICD-10-CM | POA: Insufficient documentation

## 2024-04-04 DIAGNOSIS — L905 Scar conditions and fibrosis of skin: Secondary | ICD-10-CM | POA: Insufficient documentation

## 2024-04-04 DIAGNOSIS — G4700 Insomnia, unspecified: Secondary | ICD-10-CM | POA: Diagnosis not present

## 2024-04-04 MED ORDER — TRAZODONE HCL 50 MG PO TABS: 25.0000 mg | ORAL_TABLET | Freq: Every evening | ORAL | 3 refills | Status: DC | PRN

## 2024-04-04 NOTE — Patient Instructions (Signed)
 Insomnia Insomnia is a sleep disorder that makes it difficult to fall asleep or stay asleep. Insomnia can cause fatigue, low energy, difficulty concentrating, mood swings, and poor performance at work or school. There are three different ways to classify insomnia: Difficulty falling asleep. Difficulty staying asleep. Waking up too early in the morning. Any type of insomnia can be long-term (chronic) or short-term (acute). Both are common. Short-term insomnia usually lasts for 3 months or less. Chronic insomnia occurs at least three times a week for longer than 3 months. What are the causes? Insomnia may be caused by another condition, situation, or substance, such as: Having certain mental health conditions, such as anxiety and depression. Using caffeine, alcohol, tobacco, or drugs. Having gastrointestinal conditions, such as gastroesophageal reflux disease (GERD). Having certain medical conditions. These include: Asthma. Alzheimer's disease. Stroke. Chronic pain. An overactive thyroid gland (hyperthyroidism). Other sleep disorders, such as restless legs syndrome and sleep apnea. Menopause. Sometimes, the cause of insomnia may not be known. What increases the risk? Risk factors for insomnia include: Gender. Females are affected more often than males. Age. Insomnia is more common as people get older. Stress and certain medical and mental health conditions. Lack of exercise. Having an irregular work schedule. This may include working night shifts and traveling between different time zones. What are the signs or symptoms? If you have insomnia, the main symptom is having trouble falling asleep or having trouble staying asleep. This may lead to other symptoms, such as: Feeling tired or having low energy. Feeling nervous about going to sleep. Not feeling rested in the morning. Having trouble concentrating. Feeling irritable, anxious, or depressed. How is this diagnosed? This condition  may be diagnosed based on: Your symptoms and medical history. Your health care provider may ask about: Your sleep habits. Any medical conditions you have. Your mental health. A physical exam. How is this treated? Treatment for insomnia depends on the cause. Treatment may focus on treating an underlying condition that is causing the insomnia. Treatment may also include: Medicines to help you sleep. Counseling or therapy. Lifestyle adjustments to help you sleep better. Follow these instructions at home: Eating and drinking  Limit or avoid alcohol, caffeinated beverages, and products that contain nicotine and tobacco, especially close to bedtime. These can disrupt your sleep. Do not eat a large meal or eat spicy foods right before bedtime. This can lead to digestive discomfort that can make it hard for you to sleep. Sleep habits  Keep a sleep diary to help you and your health care provider figure out what could be causing your insomnia. Write down: When you sleep. When you wake up during the night. How well you sleep and how rested you feel the next day. Any side effects of medicines you are taking. What you eat and drink. Make your bedroom a dark, comfortable place where it is easy to fall asleep. Put up shades or blackout curtains to block light from outside. Use a white noise machine to block noise. Keep the temperature cool. Limit screen use before bedtime. This includes: Not watching TV. Not using your smartphone, tablet, or computer. Stick to a routine that includes going to bed and waking up at the same times every day and night. This can help you fall asleep faster. Consider making a quiet activity, such as reading, part of your nighttime routine. Try to avoid taking naps during the day so that you sleep better at night. Get out of bed if you are still awake after  15 minutes of trying to sleep. Keep the lights down, but try reading or doing a quiet activity. When you feel  sleepy, go back to bed. General instructions Take over-the-counter and prescription medicines only as told by your health care provider. Exercise regularly as told by your health care provider. However, avoid exercising in the hours right before bedtime. Use relaxation techniques to manage stress. Ask your health care provider to suggest some techniques that may work well for you. These may include: Breathing exercises. Routines to release muscle tension. Visualizing peaceful scenes. Make sure that you drive carefully. Do not drive if you feel very sleepy. Keep all follow-up visits. This is important. Contact a health care provider if: You are tired throughout the day. You have trouble in your daily routine due to sleepiness. You continue to have sleep problems, or your sleep problems get worse. Get help right away if: You have thoughts about hurting yourself or someone else. Get help right away if you feel like you may hurt yourself or others, or have thoughts about taking your own life. Go to your nearest emergency room or: Call 911. Call the National Suicide Prevention Lifeline at (906)021-1611 or 988. This is open 24 hours a day. Text the Crisis Text Line at 325-069-2793. Summary Insomnia is a sleep disorder that makes it difficult to fall asleep or stay asleep. Insomnia can be long-term (chronic) or short-term (acute). Treatment for insomnia depends on the cause. Treatment may focus on treating an underlying condition that is causing the insomnia. Keep a sleep diary to help you and your health care provider figure out what could be causing your insomnia. This information is not intended to replace advice given to you by your health care provider. Make sure you discuss any questions you have with your health care provider. Document Revised: 11/04/2021 Document Reviewed: 11/04/2021 Elsevier Patient Education  2024 [employer].
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
## 2024-04-06 ENCOUNTER — Telehealth: Payer: Self-pay

## 2024-04-06 MED ORDER — OMEPRAZOLE 20 MG PO CPDR: 20.0000 mg | DELAYED_RELEASE_CAPSULE | Freq: Every day | ORAL | 3 refills | Status: DC

## 2024-04-06 NOTE — Telephone Encounter (Signed)
 Rx sent

## 2024-04-06 NOTE — Telephone Encounter (Signed)
 Copied from CRM [id number]. Topic: Clinical - Medication Question >> Apr 06, 2024 11:53 AM Martinique E wrote: Reason for CRM: Jearlean Mince from [employer] called regarding a fax that they tried sending on 4/28 and 4/29. This is in regards to the patient's Omeprazole  medication stating that they would like this order faxed to [phone].

## 2024-04-19 ENCOUNTER — Ambulatory Visit: Admitting: Podiatry

## 2024-04-19 DIAGNOSIS — B351 Tinea unguium: Secondary | ICD-10-CM

## 2024-04-19 DIAGNOSIS — M79675 Pain in left toe(s): Secondary | ICD-10-CM

## 2024-04-19 NOTE — Progress Notes (Unsigned)
  Chief Complaint  Patient presents with   Nail Problem    left hallux toenail is  black in color possible nail fungus.   HPI: 83 y.o. female presents today with concern of possible fungus in the left great toenail.  Notes that the front portion of the nail is lifting away.  Became concerned with the increasing dark discoloration of the nail.  Denies trauma  Past Medical History:  Diagnosis Date   Adenomatous colon polyp    Cancer (HCC) 03/2015   BCC R tibia    Difficult intubation    Diverticulosis of colon    GERD (gastroesophageal reflux disease)    Hip pain    Hyperlipidemia    Hyperplastic colon polyp    Hypertension    Lactose intolerance    Loose stools    Lower back pain    Osteoporosis    Palpitations    Past Surgical History:  Procedure Laterality Date   ANTERIOR CERVICAL DECOMP/DISCECTOMY FUSION N/A 09/11/2022   Procedure: CERVICAL FIVE-SIX ANTERIOR CERVICAL DECOMPRESSION/DISCECTOMY FUSION;  Surgeon: Van Gelinas, MD;  Location: [facility] OR;  Service: Neurosurgery;  Laterality: N/A;   BACK SURGERY     EYE SURGERY  12/08/2010   B/L 01/2011  02/2011   HAMMER TOE SURGERY     Bilateral   ORIF PATELLA Left 04/23/2023   Procedure: OPEN REDUCTION INTERNAL FIXATION (ORIF) LEFT PATELLA;  Surgeon: Wes Hamman, MD;  Location: [facility] OR;  Service: Orthopedics;  Laterality: Left;   ROTATOR CUFF REPAIR     Left   TUBAL LIGATION     Allergies  Allergen Reactions   Lactose Intolerance (Gi)     Upset stomach/ bloating     Physical Exam: Palpable pedal pulses.  Interspaces are clear.  No evidence of tinea pedis on the skin.  The left hallux nail is 3 to 4 mm thick with yellow discoloration, subungual debris, distal onycholysis, and pain with compression.  Epicritic sensation is intact.  Assessment/Plan of Care: 1. Nail fungus   2. Pain in left toe(s)     The left hallux nail clippings were obtained and [facility] for fungal nail culture.  Will call the patient in 2 to  3 weeks once the report has been received to review results and discuss treatment options.  This nail was debrided today.   Joe Murders, DPM, FACFAS [facility]     [address] [place], [place] [address]                Office [phone]  Fax [phone]

## 2024-04-23 ENCOUNTER — Other Ambulatory Visit: Payer: Self-pay | Admitting: Family Medicine

## 2024-04-28 ENCOUNTER — Telehealth: Payer: Self-pay | Admitting: Family Medicine

## 2024-04-28 NOTE — Telephone Encounter (Signed)
 Attempt was made to contact patient to make appt.  Unable to reach patient. LM on VM with my contact number [phone].

## 2024-04-28 NOTE — Telephone Encounter (Signed)
 Copied from CRM [id number]. Topic: General - Other >> Apr 28, 2024 10:20 AM Adonis Hoot wrote: Reason for CRM: Patient called in requesting a phone from someone named Tammy ,that helps with medications.She stated that she needs to schedule an appointment with her.

## 2024-05-03 DIAGNOSIS — H57813 Brow ptosis, bilateral: Secondary | ICD-10-CM | POA: Diagnosis not present

## 2024-05-03 DIAGNOSIS — H02411 Mechanical ptosis of right eyelid: Secondary | ICD-10-CM | POA: Diagnosis not present

## 2024-05-03 DIAGNOSIS — H02412 Mechanical ptosis of left eyelid: Secondary | ICD-10-CM | POA: Diagnosis not present

## 2024-05-03 DIAGNOSIS — H02413 Mechanical ptosis of bilateral eyelids: Secondary | ICD-10-CM | POA: Diagnosis not present

## 2024-05-03 DIAGNOSIS — H02831 Dermatochalasis of right upper eyelid: Secondary | ICD-10-CM | POA: Diagnosis not present

## 2024-05-03 DIAGNOSIS — Q100 Congenital ptosis: Secondary | ICD-10-CM | POA: Diagnosis not present

## 2024-05-03 DIAGNOSIS — H0279 Other degenerative disorders of eyelid and periocular area: Secondary | ICD-10-CM | POA: Diagnosis not present

## 2024-05-03 DIAGNOSIS — H02834 Dermatochalasis of left upper eyelid: Secondary | ICD-10-CM | POA: Diagnosis not present

## 2024-05-03 DIAGNOSIS — H02422 Myogenic ptosis of left eyelid: Secondary | ICD-10-CM | POA: Diagnosis not present

## 2024-05-13 ENCOUNTER — Other Ambulatory Visit: Payer: Self-pay | Admitting: Podiatry

## 2024-05-18 ENCOUNTER — Telehealth: Payer: Self-pay | Admitting: Podiatry

## 2024-05-18 NOTE — Telephone Encounter (Signed)
 Patient called in to follow up on the report, review results and discuss treatment options.  Last Appointment was on 04/19/24

## 2024-05-19 ENCOUNTER — Other Ambulatory Visit: Payer: Self-pay | Admitting: Podiatry

## 2024-05-19 DIAGNOSIS — B351 Tinea unguium: Secondary | ICD-10-CM

## 2024-05-19 MED ORDER — TERBINAFINE HCL 250 MG PO TABS: 250.0000 mg | ORAL_TABLET | Freq: Every day | ORAL | 0 refills | Status: DC

## 2024-05-19 NOTE — Progress Notes (Signed)
 Reviewed patient's fungal nail culture.  She grew out fungus and yeast.  Topicals do not typically work for both.  Her recent liver enzymes were acceptable so will send in oral terbinafine  250 mg 1 tablet p.o. daily x 90 days.  This was sent to her pharmacy.  Will have the medical assistant reach out to the patient to discuss.  Do recommend that she repeat the liver function test in 6 weeks, approximately halfway through her treatment regimen, to ensure there is no additional stress on the liver from this medication.  Patient should follow-up in approximately 3 months

## 2024-05-22 ENCOUNTER — Ambulatory Visit: Payer: Self-pay | Admitting: Podiatry

## 2024-05-23 DIAGNOSIS — H53483 Generalized contraction of visual field, bilateral: Secondary | ICD-10-CM | POA: Diagnosis not present

## 2024-05-23 NOTE — Telephone Encounter (Signed)
 LMOM for her to call to go over results.

## 2024-05-24 ENCOUNTER — Ambulatory Visit: Payer: Medicare HMO | Admitting: Family Medicine

## 2024-05-26 ENCOUNTER — Ambulatory Visit: Admitting: Family Medicine

## 2024-05-30 ENCOUNTER — Ambulatory Visit: Admitting: Family Medicine

## 2024-06-02 ENCOUNTER — Ambulatory Visit: Admitting: Family Medicine

## 2024-06-07 ENCOUNTER — Encounter: Payer: Self-pay | Admitting: Family Medicine

## 2024-06-07 ENCOUNTER — Ambulatory Visit (INDEPENDENT_AMBULATORY_CARE_PROVIDER_SITE_OTHER): Admitting: Family Medicine

## 2024-06-07 VITALS — BP 110/70 | HR 85 | Temp 97.7°F | Resp 18 | Ht 59.0 in | Wt 160.6 lb

## 2024-06-07 DIAGNOSIS — I10 Essential (primary) hypertension: Secondary | ICD-10-CM | POA: Diagnosis not present

## 2024-06-07 DIAGNOSIS — E559 Vitamin D deficiency, unspecified: Secondary | ICD-10-CM

## 2024-06-07 DIAGNOSIS — E0849 Diabetes mellitus due to underlying condition with other diabetic neurological complication: Secondary | ICD-10-CM | POA: Insufficient documentation

## 2024-06-07 DIAGNOSIS — E785 Hyperlipidemia, unspecified: Secondary | ICD-10-CM

## 2024-06-07 LAB — COMPREHENSIVE METABOLIC PANEL WITH GFR
ALT: 31 U/L (ref 0–35)
AST: 30 U/L (ref 0–37)
Albumin: 4.2 g/dL (ref 3.5–5.2)
Alkaline Phosphatase: 95 U/L (ref 39–117)
BUN: 16 mg/dL (ref 6–23)
CO2: 35 meq/L — ABNORMAL HIGH (ref 19–32)
Calcium: 9.9 mg/dL (ref 8.4–10.5)
Chloride: 99 meq/L (ref 96–112)
Creatinine, Ser: 0.77 mg/dL (ref 0.40–1.20)
GFR: 71.61 mL/min (ref >60.00)
Glucose, Bld: 103 mg/dL — ABNORMAL HIGH (ref 70–99)
Potassium: 4.7 meq/L (ref 3.5–5.1)
Sodium: 140 meq/L (ref 135–145)
Total Bilirubin: 0.7 mg/dL (ref 0.2–1.2)
Total Protein: 7.5 g/dL (ref 6.0–8.3)

## 2024-06-07 LAB — CBC WITH DIFFERENTIAL/PLATELET
Basophils Absolute: 0.1 10*3/uL (ref 0.0–0.1)
Basophils Relative: 1 % (ref 0.0–3.0)
Eosinophils Absolute: 0.2 10*3/uL (ref 0.0–0.7)
Eosinophils Relative: 3.8 % (ref 0.0–5.0)
HCT: 44.7 % (ref 36.0–46.0)
Hemoglobin: 14.9 g/dL (ref 12.0–15.0)
Lymphocytes Relative: 26.1 % (ref 12.0–46.0)
Lymphs Abs: 1.5 10*3/uL (ref 0.7–4.0)
MCHC: 33.2 g/dL (ref 30.0–36.0)
MCV: 88.9 fl (ref 78.0–100.0)
Monocytes Absolute: 0.6 10*3/uL (ref 0.1–1.0)
Monocytes Relative: 10.7 % (ref 3.0–12.0)
Neutro Abs: 3.5 10*3/uL (ref 1.4–7.7)
Neutrophils Relative %: 58.4 % (ref 43.0–77.0)
Platelets: 227 10*3/uL (ref 150.0–400.0)
RBC: 5.03 Mil/uL (ref 3.87–5.11)
RDW: 15.1 % (ref 11.5–15.5)
WBC: 5.9 10*3/uL (ref 4.0–10.5)

## 2024-06-07 LAB — LIPID PANEL
Cholesterol: 139 mg/dL (ref 0–200)
HDL: 41 mg/dL (ref >39.00)
LDL Cholesterol: 74 mg/dL (ref 0–99)
NonHDL: 98.11
Total CHOL/HDL Ratio: 3
Triglycerides: 121 mg/dL (ref 0.0–149.0)
VLDL: 24.2 mg/dL (ref 0.0–40.0)

## 2024-06-07 LAB — TSH: TSH: 2.37 u[IU]/mL (ref 0.35–5.50)

## 2024-06-07 NOTE — Assessment & Plan Note (Signed)
 Encourage heart healthy diet such as MIND or DASH diet, increase exercise, avoid trans fats, simple carbohydrates and processed foods, consider a krill or fish or flaxseed oil cap daily.

## 2024-06-07 NOTE — Patient Instructions (Signed)
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
## 2024-06-07 NOTE — Assessment & Plan Note (Signed)
 Well controlled, no changes to meds. Encouraged heart healthy diet such as the DASH diet and exercise as tolerated.

## 2024-06-08 LAB — VITAMIN D 25 HYDROXY (VIT D DEFICIENCY, FRACTURES): VITD: 37.91 ng/mL (ref 30.00–100.00)

## 2024-06-13 ENCOUNTER — Ambulatory Visit: Payer: Self-pay | Admitting: Family Medicine

## 2024-07-12 DIAGNOSIS — Q100 Congenital ptosis: Secondary | ICD-10-CM | POA: Diagnosis not present

## 2024-07-12 DIAGNOSIS — H02422 Myogenic ptosis of left eyelid: Secondary | ICD-10-CM | POA: Diagnosis not present

## 2024-07-12 DIAGNOSIS — H02834 Dermatochalasis of left upper eyelid: Secondary | ICD-10-CM | POA: Diagnosis not present

## 2024-07-12 DIAGNOSIS — H02831 Dermatochalasis of right upper eyelid: Secondary | ICD-10-CM | POA: Diagnosis not present

## 2024-07-12 DIAGNOSIS — H02413 Mechanical ptosis of bilateral eyelids: Secondary | ICD-10-CM | POA: Diagnosis not present

## 2024-07-12 DIAGNOSIS — H02411 Mechanical ptosis of right eyelid: Secondary | ICD-10-CM | POA: Diagnosis not present

## 2024-07-12 DIAGNOSIS — H0279 Other degenerative disorders of eyelid and periocular area: Secondary | ICD-10-CM | POA: Diagnosis not present

## 2024-07-12 DIAGNOSIS — H02412 Mechanical ptosis of left eyelid: Secondary | ICD-10-CM | POA: Diagnosis not present

## 2024-07-12 DIAGNOSIS — H57813 Brow ptosis, bilateral: Secondary | ICD-10-CM | POA: Diagnosis not present

## 2024-07-19 ENCOUNTER — Ambulatory Visit

## 2024-07-19 VITALS — Ht 59.0 in | Wt 160.0 lb

## 2024-07-19 DIAGNOSIS — Z0000 Encounter for general adult medical examination without abnormal findings: Secondary | ICD-10-CM | POA: Diagnosis not present

## 2024-07-19 NOTE — Patient Instructions (Signed)
 Cindy Velasquez , Thank you for taking time out of your busy schedule to complete your Annual Wellness Visit with me. I enjoyed our conversation and look forward to speaking with you again next year. I, as well as your care team,  appreciate your ongoing commitment to your health goals. Please review the following plan we discussed and let me know if I can assist you in the future. Your Game plan/ To Do List    Referrals: If you haven't heard from the office you've been referred to, please reach out to them at the phone provided.   Follow up Visits: We will see or speak with you next year for your Next Medicare AWV with our clinical staff 07/25/25 @ 10:10a Have you seen your provider in the last 6 months (3 months if uncontrolled diabetes)?   Clinician Recommendations:  Aim for 30 minutes of exercise or brisk walking, 6-8 glasses of water, and 5 servings of fruits and vegetables each day.       This is a list of the screenings recommended for you:  Health Maintenance  Topic Date Due   Complete foot exam   Never done   Eye exam for diabetics  Never done   Yearly kidney health urinalysis for diabetes  Never done   Hemoglobin A1C  02/02/2021   COVID-19 Vaccine (7 - Pfizer risk 2024-25 season) 03/11/2024   Flu Shot  07/08/2024   Mammogram  12/16/2024   Yearly kidney function blood test for diabetes  06/07/2025   Medicare Annual Wellness Visit  07/19/2025   DTaP/Tdap/Td vaccine (3 - Td or Tdap) 06/05/2031   Pneumococcal Vaccine for age over 53  Completed   DEXA scan (bone density measurement)  Completed   Zoster (Shingles) Vaccine  Completed   Hepatitis B Vaccine  Aged Out   HPV Vaccine  Aged Out   Meningitis B Vaccine  Aged Out   Hepatitis C Screening  Discontinued    Advanced directives: (In Chart) A copy of your advanced directives are scanned into your chart should your provider ever need it. Advance Care Planning is important because it:  [x]  Makes sure you receive the medical care  that is consistent with your values, goals, and preferences  [x]  It provides guidance to your family and loved ones and reduces their decisional burden about whether or not they are making the right decisions based on your wishes.  Follow the link provided in your after visit summary or read over the paperwork we have mailed to you to help you started getting your Advance Directives in place. If you need assistance in completing these, please reach out to us  so that we can help you!  See attachments for Preventive Care and Fall Prevention Tips.

## 2024-07-19 NOTE — Progress Notes (Signed)
 Subjective:   Cindy Velasquez is a 83 y.o. who presents for a Medicare Wellness preventive visit.  As a reminder, Annual Wellness Visits don't include a physical exam, and some assessments may be limited, especially if this visit is performed virtually. We may recommend an in-person follow-up visit with your provider if needed.  Visit Complete: Virtual I connected with  Terry DELENA Prescott on 07/19/24 by a audio enabled telemedicine application and verified that I am speaking with the correct person using two identifiers.  Patient Location: Home  Provider Location: Home Office  I discussed the limitations of evaluation and management by telemedicine. The patient expressed understanding and agreed to proceed.  Vital Signs: Because this visit was a virtual/telehealth visit, some criteria may be missing or patient reported. Any vitals not documented were not able to be obtained and vitals that have been documented are patient reported.    Persons Participating in Visit: Patient.  AWV Questionnaire: No: Patient Medicare AWV questionnaire was not completed prior to this visit.  Cardiac Risk Factors include: advanced age (>20men, >20 women);hypertension     Objective:    Today's Vitals   07/19/24 1017  Weight: 160 lb (72.6 kg)  Height: 4' 11 (1.499 m)   Body mass index is 32.32 kg/m.     07/19/2024   10:22 AM 07/01/2023   11:01 AM 04/19/2023   10:26 AM 04/17/2023    8:04 PM 10/12/2022    1:43 PM 09/15/2022   11:03 AM 09/08/2022   10:58 AM  Advanced Directives  Does Patient Have a Medical Advance Directive? Yes Yes No No Yes Yes Yes  Type of Estate agent of [place];Living will Healthcare Power of [place];Living will    Healthcare Power of Attorney   Does patient want to make changes to medical advance directive? No - Patient declined No - Patient declined    No - Patient declined   Copy of Healthcare Power of Attorney in Chart? Yes - validated most recent copy  scanned in chart (See row information) Yes - validated most recent copy scanned in chart (See row information)       Would patient like information on creating a medical advance directive?   Yes (ED - Information included in AVS) No - Patient declined       Current Medications (verified) Outpatient Encounter Medications as of 07/19/2024  Medication Sig   Cholecalciferol (VITAMIN D ) 50 MCG (2000 UT) tablet Take 2,000 Units by mouth daily.   clotrimazole -betamethasone  (LOTRISONE ) cream Apply 1 application topically 2 (two) times daily. Use as needed for rash (Patient taking differently: Apply 1 application  topically 2 (two) times daily as needed (rash).)   fluticasone  (FLONASE ) 50 MCG/ACT nasal spray Place 2 sprays into both nostrils daily as needed for allergies.   hydrochlorothiazide  (HYDRODIURIL ) 25 MG tablet TAKE 1/2 TABLET(12.5 MG) BY MOUTH EVERY MORNING   lisinopril  (ZESTRIL ) 20 MG tablet Take 1 tablet (20 mg total) by mouth daily.   Multiple Vitamins-Minerals (ICAPS AREDS 2) CAPS Take 1 capsule by mouth in the morning and at bedtime.   Olopatadine  HCl (PATADAY ) 0.2 % SOLN 1 gtt each eye qd (Patient taking differently: Place 1 drop into both eyes daily.)   omeprazole  (PRILOSEC) 20 MG capsule Take 1 capsule (20 mg total) by mouth daily. (Patient not taking: Reported on 06/07/2024)   OVER THE COUNTER MEDICATION Apply 1 Application topically daily as needed (pain). THC pain relieving cream   rosuvastatin  (CRESTOR ) 20 MG tablet TAKE 1  TABLET(20 MG) BY MOUTH DAILY   terbinafine  (LAMISIL ) 250 MG tablet Take 1 tablet (250 mg total) by mouth daily.   traZODone  (DESYREL ) 50 MG tablet Take 0.5-1 tablets (25-50 mg total) by mouth at bedtime as needed for sleep. (Patient not taking: Reported on 06/07/2024)   Wheat Dextrin (BENEFIBER) POWD Take 1 Dose by mouth daily. 1 dose = 2 teaspoons   No facility-administered encounter medications on file as of 07/19/2024.    Allergies (verified) Lactose intolerance  (gi)   History: Past Medical History:  Diagnosis Date   Adenomatous colon polyp    Cancer (HCC) 03/2015   BCC R tibia    Difficult intubation    Diverticulosis of colon    GERD (gastroesophageal reflux disease)    Hip pain    Hyperlipidemia    Hyperplastic colon polyp    Hypertension    Lactose intolerance    Loose stools    Lower back pain    Osteoporosis    Palpitations    Past Surgical History:  Procedure Laterality Date   ANTERIOR CERVICAL DECOMP/DISCECTOMY FUSION N/A 09/11/2022   Procedure: CERVICAL FIVE-SIX ANTERIOR CERVICAL DECOMPRESSION/DISCECTOMY FUSION;  Surgeon: Debby Dorn MATSU, MD;  Location: [facility] OR;  Service: Neurosurgery;  Laterality: N/A;   BACK SURGERY     EYE SURGERY  12/08/2010   B/L 01/2011  02/2011   HAMMER TOE SURGERY     Bilateral   ORIF PATELLA Left 04/23/2023   Procedure: OPEN REDUCTION INTERNAL FIXATION (ORIF) LEFT PATELLA;  Surgeon: Jerri Kay HERO, MD;  Location: [facility] OR;  Service: Orthopedics;  Laterality: Left;   ROTATOR CUFF REPAIR     Left   TUBAL LIGATION     Family History  Problem Relation Age of Onset   Hypertension Mother    Cancer Mother 36       ?   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  Tobacco Use   Smoking status: Former    Current packs/day: 0.00    Average packs/day: 0.5 packs/day for 30.0 years (15.0 ttl pk-yrs)    Types: Cigarettes    Start date: 06/01/1961    Quit date: 06/02/1991     Years since quitting: 33.1   Smokeless tobacco: Never  Vaping Use   Vaping status: Never Used  Substance and Sexual Activity   Alcohol use: Yes    Alcohol/week: 2.0 standard drinks of alcohol    Types: 2 Glasses of wine per week   Drug use: No   Sexual activity: Not Currently    Partners: Male  Other Topics Concern   Not on file  Social History Narrative   Exercise--  [employer]--yoga and cardio 3x a week      Right Handed    Lives in a one story home    Social Drivers  of Health   Financial Resource Strain: Low Risk  (07/19/2024)   Overall Financial Resource Strain (CARDIA)    Difficulty of Paying Living Expenses: Not hard at all  Food Insecurity: No Food Insecurity (07/19/2024)   Hunger Vital Sign    Worried About Running Out of Food in the Last Year: Never true    Ran Out of Food in the Last Year: Never true  Transportation Needs: No Transportation Needs (07/19/2024)   PRAPARE - Administrator, Civil Service (Medical): No    Lack of Transportation (Non-Medical): No  Physical Activity: Insufficiently Active (07/19/2024)   Exercise Vital Sign    Days of Exercise per Week: 2 days    Minutes of Exercise per Session: 60 min  Stress: No Stress Concern Present (07/19/2024)   [employer] of Occupational Health - Occupational Stress Questionnaire    Feeling of Stress: Not at all  Social Connections: Socially Integrated (07/19/2024)   Social Connection and Isolation Panel    Frequency of Communication with Friends and Family: More than three times a week    Frequency of Social Gatherings with Friends and Family: More than three times a week    Attends Religious Services: More than 4 times per year    Active Member of [employer] or Organizations: Yes    Attends Engineer, structural: More than 4 times per year    Marital Status: Married    Tobacco Counseling Counseling given: Not Answered    Clinical Intake:  Pre-visit preparation completed: Yes  Pain :  No/denies pain     BMI - recorded: 32.32 Nutritional Status: BMI > 30  Obese Nutritional Risks: None Diabetes: No  Lab Results  Component Value Date   HGBA1C 5.9 (H) 08/02/2020   HGBA1C 5.7 (H) 03/30/2019   HGBA1C 5.7 (H) 12/09/2018     How often do you need to have someone help you when you read instructions, pamphlets, or other written materials from your doctor or pharmacy?: 1 - Never  Interpreter Needed?: No  Information entered by :: Rojelio Blush LPN   Activities of Daily Living      07/19/2024   10:21 AM  In your present state of health, do you have any difficulty performing the following activities:  Hearing? 0  Vision? 0  Difficulty concentrating or making decisions? 0  Walking or climbing stairs? 0  Dressing or bathing? 0  Doing errands, shopping? 0  Preparing Food and eating ? N  Using the Toilet? N  In the past six months, have you accidently leaked urine? N  Do you have problems with loss of bowel control? N  Managing your Medications? N  Managing your Finances? N  Housekeeping or managing your Housekeeping? N    Patient Care Team: Antonio Meth, Jamee SAUNDERS, DO as PCP - General Octavia Charleston, MD as Consulting Physician (Ophthalmology) Abran Norleen SAILOR, MD as Consulting Physician (Gastroenterology) Day, [place], [facility] (Inactive) as Pharmacist (Pharmacist) Patel, Donika K, DO as Consulting Physician (Neurology)   I have updated your Care Teams any recent Medical Services you may have received from other providers in the past year.     Assessment:   This is a routine wellness examination for [place].  Hearing/Vision screen Hearing Screening - Comments:: Denies hearing difficulties   Vision Screening - Comments:: Wears rx glasses - up to date with routine eye exams with  Dr Octavia   Goals Addressed  This Visit's Progress     Increase physical activity (pt-stated)        Remain Active.       Depression Screen      07/19/2024    10:20 AM 07/01/2023   11:11 AM 01/27/2023    5:48 PM 11/24/2022    1:10 PM 08/29/2022    8:22 AM 01/28/2022    1:02 PM 06/04/2021    1:04 PM  PHQ 2/9 Scores  PHQ - 2 Score 0 0 1 0 0 0 0  PHQ- 9 Score   8        Fall Risk      07/19/2024   10:21 AM 07/01/2023   11:00 AM 01/05/2023    1:29 PM 11/24/2022    1:10 PM 08/29/2022    8:21 AM  Fall Risk   Falls in the past year? 0 1 0 0 0  Number falls in past yr: 0 0 0 0 0  Injury with Fall? 0 1 0 0 0  Risk for fall due to : No Fall Risks History of fall(s)     Follow up Falls evaluation completed Falls evaluation completed Falls evaluation completed Falls evaluation completed  Falls evaluation completed      Data saved with a previous flowsheet row definition    MEDICARE RISK AT HOME:   Medicare Risk at Home Any stairs in or around the home?: No If so, are there any without handrails?: No Home free of loose throw rugs in walkways, pet beds, electrical cords, etc?: Yes Adequate lighting in your home to reduce risk of falls?: Yes Life alert?: No Use of a cane, walker or w/c?: No Grab bars in the bathroom?: Yes Shower chair or bench in shower?: Yes Elevated toilet seat or a handicapped toilet?: Yes  TIMED UP AND GO:  Was the test performed?  No  Cognitive Function: 6CIT completed    08/05/2016    9:52 AM  MMSE - Mini Mental State Exam  Orientation to time 5   Orientation to Place 5   Registration 3   Attention/ Calculation 5   Recall 3   Language- name 2 objects 2   Language- repeat 1  Language- follow 3 step command 3   Language- read & follow direction 1   Write a sentence 1   Copy design 0   Total score 29      Data saved with a previous flowsheet row definition        07/19/2024   10:23 AM 07/01/2023   11:12 AM  6CIT Screen  What Year? 0 points 0 points  What month? 0 points 0 points  What time? 0 points 0 points  Count back from 20 0 points 0 points  Months in reverse 0 points 0 points  Repeat phrase 0  points 0 points  Total Score 0 points 0 points    Immunizations Immunization History  Administered Date(s) Administered    sv, Bivalent, Protein Subunit Rsvpref,pf [place]) 01/29/2024   Fluad  Quad(high Dose 65+) 09/24/2020, 09/23/2021, 12/26/2022   Fluad  Trivalent(High Dose 65+) 09/11/2023   H1N1 12/28/2008   Influenza Whole 10/20/2007, 10/18/2008, 11/15/2012   Influenza, High Dose Seasonal PF 09/03/2018   Influenza-Unspecified 12/20/2013, 11/16/2016   PFIZER(Purple Top)SARS-COV-2 Vaccination 12/19/2019, 01/08/2020, 09/28/2020   Pfizer Covid-19 Vaccine Bivalent Booster 49yrs & up 09/23/2021   Pfizer(Comirnaty )Fall Seasonal Vaccine 12 years and older 12/26/2022, 09/11/2023   Pneumococcal Conjugate-13 04/23/2015   Pneumococcal Polysaccharide-23 12/28/2008   Td 06/04/2021  Tdap 08/01/2013   Zoster Recombinant(Shingrix) 12/10/2020, 03/25/2021    Screening Tests Health Maintenance  Topic Date Due   FOOT EXAM  Never done   OPHTHALMOLOGY EXAM  Never done   Diabetic kidney evaluation - Urine ACR  Never done   HEMOGLOBIN A1C  02/02/2021   COVID-19 Vaccine (7 - Pfizer risk 2024-25 season) 03/11/2024   INFLUENZA VACCINE  07/08/2024   MAMMOGRAM  12/16/2024   Diabetic kidney evaluation - eGFR measurement  06/07/2025   Medicare Annual Wellness (AWV)  07/19/2025   DTaP/Tdap/Td (3 - Td or Tdap) 06/05/2031   Pneumococcal Vaccine: 50+ Years  Completed   DEXA SCAN  Completed   Zoster Vaccines- Shingrix  Completed   Hepatitis B Vaccines  Aged Out   HPV VACCINES  Aged Out   Meningococcal B Vaccine  Aged Out   Hepatitis C Screening  Discontinued    Health Maintenance  Health Maintenance Due  Topic Date Due   FOOT EXAM  Never done   OPHTHALMOLOGY EXAM  Never done   Diabetic kidney evaluation - Urine ACR  Never done   HEMOGLOBIN A1C  02/02/2021   COVID-19 Vaccine (7 - Pfizer risk 2024-25 season) 03/11/2024   INFLUENZA VACCINE  07/08/2024   Health Maintenance Items  Addressed:   Additional Screening:  Vision Screening: Recommended annual ophthalmology exams for early detection of glaucoma and other disorders of the eye. Would you like a referral to an eye doctor? No    Dental Screening: Recommended annual dental exams for proper oral hygiene  Community Resource Referral / Chronic Care Management: CRR required this visit?  No   CCM required this visit?  No   Plan:    I have personally reviewed and noted the following in the patient's chart:   Medical and social history Use of alcohol, tobacco or illicit drugs  Current medications and supplements including opioid prescriptions. Patient is not currently taking opioid prescriptions. Functional ability and status Nutritional status Physical activity Advanced directives List of other physicians Hospitalizations, surgeries, and ER visits in previous 12 months Vitals Screenings to include cognitive, depression, and falls Referrals and appointments  In addition, I have reviewed and discussed with patient certain preventive protocols, quality metrics, and best practice recommendations. A written personalized care plan for preventive services as well as general preventive health recommendations were provided to patient.   Rojelio LELON Blush, LPN   1/[id number]/[id number]   After Visit Summary: (MyChart) Due to this being a telephonic visit, the after visit summary with patients personalized plan was offered to patient via MyChart   Notes: Nothing significant to report at this time.

## 2024-07-29 ENCOUNTER — Other Ambulatory Visit: Payer: Self-pay | Admitting: Family Medicine

## 2024-07-29 ENCOUNTER — Other Ambulatory Visit: Payer: Self-pay | Admitting: Podiatry

## 2024-07-30 ENCOUNTER — Other Ambulatory Visit: Payer: Self-pay | Admitting: Family Medicine

## 2024-07-30 DIAGNOSIS — F439 Reaction to severe stress, unspecified: Secondary | ICD-10-CM

## 2024-08-22 ENCOUNTER — Telehealth: Payer: Self-pay | Admitting: Family Medicine

## 2024-08-22 NOTE — Telephone Encounter (Signed)
 Copied from CRM #[id number]. Topic: Referral - Request for Referral >> Aug 22, 2024  9:37 AM Ahlexyia S wrote: Did the patient discuss referral with their provider in the last year? Yes (If No - schedule appointment) (If Yes - send message)  Appointment offered? No  Type of order/referral and detailed reason for visit: Stomach issues  Preference of office, provider, location: No preference   If referral order, have you been seen by this specialty before? Yes, it has been quite some time when pt was last seen. (If Yes, this issue or another issue? When? Where?  Can we respond through MyChart? No

## 2024-08-23 ENCOUNTER — Other Ambulatory Visit: Payer: Self-pay | Admitting: Family Medicine

## 2024-08-23 DIAGNOSIS — K580 Irritable bowel syndrome with diarrhea: Secondary | ICD-10-CM

## 2024-08-24 ENCOUNTER — Other Ambulatory Visit: Payer: Self-pay

## 2024-08-24 DIAGNOSIS — K580 Irritable bowel syndrome with diarrhea: Secondary | ICD-10-CM

## 2024-08-24 NOTE — Telephone Encounter (Signed)
 Referral placed.

## 2024-08-30 ENCOUNTER — Encounter: Payer: Self-pay | Admitting: Internal Medicine

## 2024-09-02 ENCOUNTER — Other Ambulatory Visit (HOSPITAL_BASED_OUTPATIENT_CLINIC_OR_DEPARTMENT_OTHER): Payer: Self-pay

## 2024-09-02 MED ORDER — FLUZONE HIGH-DOSE 0.5 ML IM SUSY
0.5000 mL | PREFILLED_SYRINGE | Freq: Once | INTRAMUSCULAR | 0 refills | Status: AC
  Filled 2024-09-02: qty 0.5, 1d supply, fill #0

## 2024-09-02 MED ORDER — COMIRNATY 30 MCG/0.3ML IM SUSY
0.3000 mL | PREFILLED_SYRINGE | Freq: Once | INTRAMUSCULAR | 0 refills | Status: AC
  Filled 2024-09-02: qty 0.3, 1d supply, fill #0

## 2024-09-26 ENCOUNTER — Other Ambulatory Visit: Payer: Self-pay | Admitting: Family Medicine

## 2024-10-10 ENCOUNTER — Encounter: Payer: Self-pay | Admitting: Radiology

## 2024-10-20 ENCOUNTER — Encounter (INDEPENDENT_AMBULATORY_CARE_PROVIDER_SITE_OTHER): Payer: Medicare HMO | Admitting: Ophthalmology

## 2024-10-20 DIAGNOSIS — H353132 Nonexudative age-related macular degeneration, bilateral, intermediate dry stage: Secondary | ICD-10-CM | POA: Diagnosis not present

## 2024-10-20 DIAGNOSIS — H35033 Hypertensive retinopathy, bilateral: Secondary | ICD-10-CM

## 2024-10-20 DIAGNOSIS — H43813 Vitreous degeneration, bilateral: Secondary | ICD-10-CM

## 2024-10-20 DIAGNOSIS — I10 Essential (primary) hypertension: Secondary | ICD-10-CM | POA: Diagnosis not present

## 2024-10-25 ENCOUNTER — Encounter: Payer: Self-pay | Admitting: Internal Medicine

## 2024-10-25 ENCOUNTER — Ambulatory Visit: Admitting: Internal Medicine

## 2024-10-25 VITALS — BP 148/68 | HR 79 | Ht 59.0 in | Wt 160.4 lb

## 2024-10-25 DIAGNOSIS — R143 Flatulence: Secondary | ICD-10-CM | POA: Diagnosis not present

## 2024-10-25 DIAGNOSIS — K580 Irritable bowel syndrome with diarrhea: Secondary | ICD-10-CM | POA: Diagnosis not present

## 2024-10-25 DIAGNOSIS — Z860100 Personal history of colon polyps, unspecified: Secondary | ICD-10-CM

## 2024-10-25 MED ORDER — METRONIDAZOLE 250 MG PO TABS: 250.0000 mg | ORAL_TABLET | Freq: Three times a day (TID) | ORAL | 1 refills | Status: DC

## 2024-10-25 NOTE — Patient Instructions (Signed)
 We have sent the following medications to your pharmacy for you to pick up at your convenience:  Flagyl   Increase Benefiber to 2 tablespoons daily.  Take Imodium as needed.  _______________________________________________________  If your blood pressure at your visit was 140/90 or greater, please contact your primary care physician to follow up on this.  _______________________________________________________  If you are age 83 or older, your body mass index should be between 23-30. Your Body mass index is 32.4 kg/m. If this is out of the aforementioned range listed, please consider follow up with your Primary Care Provider.  If you are age 41 or younger, your body mass index should be between 19-25. Your Body mass index is 32.4 kg/m. If this is out of the aformentioned range listed, please consider follow up with your Primary Care Provider.   ________________________________________________________  The [place] [facility] providers would like to encourage you to use MYCHART to communicate with providers for non-urgent requests or questions.  Due to long hold times on the telephone, sending your provider a message by [facility] may be a faster and more efficient way to get a response.  Please allow 48 business hours for a response.  Please remember that this is for non-urgent requests.  _______________________________________________________  [facility] is using a team-based approach to care.  Your team is made up of your doctor and two to three APPS. Our APPS (Nurse Practitioners and Physician Assistants) work with your physician to ensure care continuity for you. They are fully qualified to address your health concerns and develop a treatment plan. They communicate directly with your gastroenterologist to care for you. Seeing the Advanced Practice Practitioners on your physician's team can help you by facilitating care more promptly, often allowing for earlier appointments, access to  diagnostic testing, procedures, and other specialty referrals.

## 2024-10-25 NOTE — Progress Notes (Signed)
 HISTORY OF PRESENT ILLNESS:  Cindy Velasquez is a 83 y.o. female , native of [place] , who presents today for follow-up regarding recurrent diarrhea.  The patient has had problems with diarrhea for many years.  She has undergone extensive work-up including colonoscopy with negative random biopsies in January 2020.  Testing for celiac disease and pancreatic insufficiency were both negative.  Colestid  was unsatisfactorily helpful.  At 1 point in 2020, she was treated with a 10-day course of metronidazole  which promptly resolved her issues with diarrhea.  She did well thereafter for about 2 months and now has developed recurrent diarrhea.  She was seen again October 2020.  See that dictation.  She was to follow-up in 3 months, but did not.  Recently had some issues with loose stools but now describing 5 or 6 bowel movements which are loose daily.  Mostly in the morning.  Often after meals.  She has had weight loss which is by intent.  No new problems.  She does report rare occasions of right lower quadrant discomfort which is short-lived and tolerable.  She has not use antidiarrheals.  No incontinence though it does affect her quality of life and ability to get out of the house for extended periods of time.  Today's complaints are exactly as they were at the time of her last visit 5 years ago.  No worrisome features such as bleeding or unexplained weight loss.  She has started Benefiber, which helps.  She also mentions belching and flatus.  Laboratories from June 07, 2024.  Unremarkable comprehensive metabolic panel.  Normal CBC with hemoglobin 14.9.  Normal TSH 2.37     REVIEW OF SYSTEMS:  All non-GI ROS negative except for back pain, visual changes, urinary leakage, sleeping problems  Past Medical History:  Diagnosis Date   Adenomatous colon polyp    Cancer (HCC) 03/2015   BCC R tibia    Difficult intubation    Diverticulosis of colon    GERD (gastroesophageal reflux disease)    Hip pain     Hyperlipidemia    Hyperplastic colon polyp    Hypertension    Lactose intolerance    Loose stools    Lower back pain    Osteoporosis    Palpitations     Past Surgical History:  Procedure Laterality Date   ANTERIOR CERVICAL DECOMP/DISCECTOMY FUSION N/A 09/11/2022   Procedure: CERVICAL FIVE-SIX ANTERIOR CERVICAL DECOMPRESSION/DISCECTOMY FUSION;  Surgeon: Debby Dorn MATSU, MD;  Location: [facility] OR;  Service: Neurosurgery;  Laterality: N/A;   BACK SURGERY     EYE SURGERY  12/08/2010   B/L 01/2011  02/2011   HAMMER TOE SURGERY     Bilateral   ORIF PATELLA Left 04/23/2023   Procedure: OPEN REDUCTION INTERNAL FIXATION (ORIF) LEFT PATELLA;  Surgeon: Jerri Kay HERO, MD;  Location: [facility] OR;  Service: Orthopedics;  Laterality: Left;   RECONSTRUCTION OF EYELID     ROTATOR CUFF REPAIR     Left   TUBAL LIGATION      Social History ASUZENA WEIS  reports that she quit smoking about 33 years ago. Her smoking use included cigarettes. She started smoking about 63 years ago. She has a 15 pack-year smoking history. She has never used smokeless tobacco. She reports current alcohol use of about 2.0 standard drinks of alcohol per week. She reports that she does not use drugs.  family history includes Cancer (age of onset: 34) in her mother; Heart disease in her brother and father; Hyperlipidemia in  her mother; Hypertension in her brother, brother, father, and mother; Obesity in her mother; Stroke in her father; Testicular cancer (age of onset: 58) in her brother.  Allergies  Allergen Reactions   Lactose Intolerance (Gi)     Upset stomach/ bloating        PHYSICAL EXAMINATION: Vital signs: BP (!) 148/68   Pulse 79   Ht 4' 11 (1.499 m)   Wt 160 lb 6.4 oz (72.8 kg)   BMI 32.40 kg/m   Constitutional: generally well-appearing, no acute distress Psychiatric: alert and oriented x3, cooperative Eyes: extraocular movements intact, anicteric, conjunctiva pink Mouth: oral pharynx moist, no  lesions Neck: supple no lymphadenopathy Cardiovascular: heart regular rate and rhythm, no murmur Lungs: clear to auscultation bilaterally Abdomen: soft, nontender, nondistended, no obvious ascites, no peritoneal signs, normal bowel sounds, no organomegaly Rectal: Omitted Extremities: no clubbing, cyanosis, or lower extremity edema bilaterally Skin: no lesions on visible extremities Neuro: No focal deficits.  Cranial nerves intact  ASSESSMENT:  1.  Diarrhea predominant irritable bowel syndrome 2.  History of nonadvanced adenomatous colon polyp 2008.  Negative for neoplasia examination 2011.  Most recent colonoscopy January 2020 with diverticulosis.  Prior random colon biopsies were unremarkable 3.  Increased intestinal gas 4.  General Medical problems.  Stable   PLAN:  1.  Increase Benefiber to 2 tablespoons daily 2.  Empiric course of metronidazole  250 mg p.o. 3 times daily x 14 days.  1 refill.  Medication risks reviewed.  Told not to consume alcohol while on this medication 3.  Imodium as needed.  Adjust as needed 4.  Office follow-up 3 months.  Contact the office in the interim for questions or problems Total time of 60 minutes was spent preparing to see the patient, obtaining comprehensive history, performing medically appropriate physical examination, counseling and educating the patient regarding the above listed issues, ordering medication, arranging follow-up, and documenting clinical information in the health record

## 2024-11-22 ENCOUNTER — Other Ambulatory Visit: Payer: Self-pay | Admitting: Pharmacist

## 2024-11-22 ENCOUNTER — Telehealth: Payer: Self-pay | Admitting: Pharmacist

## 2024-11-22 DIAGNOSIS — I10 Essential (primary) hypertension: Secondary | ICD-10-CM

## 2024-11-22 MED ORDER — LISINOPRIL 20 MG PO TABS: 20.0000 mg | ORAL_TABLET | Freq: Every day | ORAL | 0 refills | Status: AC

## 2024-11-22 NOTE — Progress Notes (Signed)
 Pharmacy Quality Measure Review  This patient is appearing on a report for being at risk of failing the adherence measure for hypertension (ACEi/ARB) medications this calendar year.   Medication: lisinopril  Last fill date: 06/28/2024 for 90 day supply  Patient has no refills remaining. Next appointment with PCP is 12/15/2024.    Will collaborate with provider to facilitate refill needs.  Madelin Ray, PharmD Clinical Pharmacist [facility]

## 2024-12-13 ENCOUNTER — Ambulatory Visit: Admitting: Family Medicine

## 2024-12-15 ENCOUNTER — Ambulatory Visit: Admitting: Family Medicine

## 2024-12-19 ENCOUNTER — Ambulatory Visit: Admitting: Family Medicine

## 2024-12-27 ENCOUNTER — Ambulatory Visit: Admitting: Family Medicine

## 2024-12-27 ENCOUNTER — Encounter: Payer: Self-pay | Admitting: Family Medicine

## 2024-12-27 VITALS — BP 142/78 | HR 68 | Temp 98.2°F | Resp 16 | Ht 59.0 in | Wt 158.2 lb

## 2024-12-27 DIAGNOSIS — I10 Essential (primary) hypertension: Secondary | ICD-10-CM

## 2024-12-27 DIAGNOSIS — E559 Vitamin D deficiency, unspecified: Secondary | ICD-10-CM

## 2024-12-27 DIAGNOSIS — M810 Age-related osteoporosis without current pathological fracture: Secondary | ICD-10-CM | POA: Diagnosis not present

## 2024-12-27 DIAGNOSIS — R21 Rash and other nonspecific skin eruption: Secondary | ICD-10-CM

## 2024-12-27 DIAGNOSIS — Z0000 Encounter for general adult medical examination without abnormal findings: Secondary | ICD-10-CM

## 2024-12-27 DIAGNOSIS — E785 Hyperlipidemia, unspecified: Secondary | ICD-10-CM | POA: Diagnosis not present

## 2024-12-27 DIAGNOSIS — R7303 Prediabetes: Secondary | ICD-10-CM | POA: Diagnosis not present

## 2024-12-27 MED ORDER — CLOTRIMAZOLE-BETAMETHASONE 1-0.05 % EX CREA: 1.0000 | TOPICAL_CREAM | Freq: Two times a day (BID) | CUTANEOUS | 1 refills | Status: AC

## 2024-12-27 NOTE — Assessment & Plan Note (Signed)
 See labs

## 2024-12-27 NOTE — Assessment & Plan Note (Signed)
 Ghm utd Check labs  See AVS  Health Maintenance  Topic Date Due   Diabetic kidney evaluation - Urine ACR  Never done   HEMOGLOBIN A1C  02/02/2021   Mammogram  12/16/2024   OPHTHALMOLOGY EXAM  02/14/2025   COVID-19 Vaccine (8 - Pfizer risk 2025-26 season) 03/02/2025   FOOT EXAM  04/19/2025   Diabetic kidney evaluation - eGFR measurement  06/07/2025   Medicare Annual Wellness (AWV)  07/19/2025   DTaP/Tdap/Td (3 - Td or Tdap) 06/05/2031   Pneumococcal Vaccine: 50+ Years  Completed   Influenza Vaccine  Completed   Bone Density Scan  Completed   Zoster Vaccines- Shingrix  Completed   Meningococcal B Vaccine  Aged Out   Hepatitis C Screening  Discontinued

## 2024-12-27 NOTE — Progress Notes (Unsigned)
 "    Subjective:    Patient ID: Cindy Velasquez, female    DOB: [birth year], 84 y.o.   MRN: [id number]  Chief Complaint  Patient presents with   Annual Exam    HPI Patient is in today for cpe.  Discussed the use of AI scribe software for clinical note transcription with the patient, who gave verbal consent to proceed.  History of Present Illness Cindy Velasquez is an 84 year old female who presents for an annual physical exam and follow-up on macular degeneration.  She has been diagnosed with wet macular degeneration in her right eye and is under the care of a specialist. She received one injection and has a follow-up scheduled in February for a second injection. Initially, she was seeing Dr. Alvia, who suggested a wait-and-see approach, but Dr. Jens recommended seeing Dr. Anette at the retina center due to concerns about her vision worsening.  In August, she underwent blepharoplasty to correct drooping eyelids that were affecting her vision. The procedure was performed on both eyes.  She is experiencing gastrointestinal issues and is under the care of Dr. Abran. She was prescribed metronidazole  to be taken three times a day for 30 days to manage diarrhea, but she has not started the medication yet. She also uses Benefiber and Imodium as needed to manage her symptoms.  She has not had a mammogram this year, with the last one being in January of the previous year. She plans to schedule one soon.  She has not been to the dentist recently as her previous dentist retired, and she is in need of a cleaning. She does not have dental insurance but is considering options for dental care.    Past Medical History:  Diagnosis Date   Adenomatous colon polyp    Cancer (HCC) 03/2015   BCC R tibia    Difficult intubation    Diverticulosis of colon    GERD (gastroesophageal reflux disease)    Hip pain    Hyperlipidemia    Hyperplastic colon polyp    Hypertension    Lactose intolerance     Loose stools    Lower back pain    Osteoporosis    Palpitations     Past Surgical History:  Procedure Laterality Date   ANTERIOR CERVICAL DECOMP/DISCECTOMY FUSION N/A 09/11/2022   Procedure: CERVICAL FIVE-SIX ANTERIOR CERVICAL DECOMPRESSION/DISCECTOMY FUSION;  Surgeon: Debby Dorn MATSU, MD;  Location: [facility] OR;  Service: Neurosurgery;  Laterality: N/A;   BACK SURGERY     blepheroplasty Bilateral    EYE SURGERY  12/08/2010   B/L 01/2011  02/2011   HAMMER TOE SURGERY     Bilateral   ORIF PATELLA Left 04/23/2023   Procedure: OPEN REDUCTION INTERNAL FIXATION (ORIF) LEFT PATELLA;  Surgeon: Jerri Kay HERO, MD;  Location: [facility] OR;  Service: Orthopedics;  Laterality: Left;   RECONSTRUCTION OF EYELID     ROTATOR CUFF REPAIR     Left   TUBAL LIGATION      Family History  Problem Relation Age of Onset   Hypertension Mother    Cancer Mother 27       ?   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  Tobacco Use   Smoking status: Former    Current packs/day: 0.00    Average packs/day: 0.5 packs/day for 30.0 years (15.0 ttl pk-yrs)    Types: Cigarettes    Start date: 06/01/1961    Quit date: 06/02/1991    Years since quitting: 33.5   Smokeless tobacco: Never  Vaping Use   Vaping status: Never Used  Substance and Sexual Activity   Alcohol use: Yes    Alcohol/week: 2.0 standard drinks of alcohol    Types: 2 Glasses of wine per week   Drug use: No    Sexual activity: Not Currently    Partners: Male  Other Topics Concern   Not on file  Social History Narrative   Exercise--  [employer]--yoga and cardio 3x a week      Right Handed    Lives in a one story home    Social Drivers of Health   Tobacco Use: Medium Risk (12/27/2024)   Patient History    Smoking Tobacco Use: Former    Smokeless Tobacco Use: Never    Passive Exposure: Not on file  Financial Resource Strain: Low Risk (07/19/2024)   Overall Financial Resource Strain (CARDIA)    Difficulty of Paying Living Expenses: Not hard at all  Food Insecurity: No Food Insecurity (07/19/2024)   Epic    Worried About Radiation Protection Practitioner of Food in the Last Year: Never true    Ran Out of Food in the Last Year: Never true  Transportation Needs: No Transportation Needs (07/19/2024)   Epic    Lack of Transportation (Medical): No    Lack of Transportation (Non-Medical): No  Physical Activity: Insufficiently Active (07/19/2024)   Exercise Vital Sign    Days of Exercise per Week: 2 days    Minutes of Exercise per Session: 60 min  Stress: No Stress Concern Present (07/19/2024)   [employer] of Occupational Health - Occupational Stress Questionnaire    Feeling of Stress: Not at all  Social Connections: Socially Integrated (07/19/2024)   Social Connection and Isolation Panel    Frequency of Communication with Friends and Family: More than three times a week    Frequency of Social Gatherings with Friends and Family: More than three times a week    Attends Religious Services: More than 4 times per year    Active Member of Clubs or Organizations: Yes    Attends Banker Meetings: More than 4 times per year    Marital Status: Married  Catering Manager Violence: Not At Risk (07/19/2024)   Epic    Fear of Current or Ex-Partner: No    Emotionally Abused: No    Physically Abused: No    Sexually Abused: No  Depression (PHQ2-9): Low Risk (12/27/2024)   Depression (PHQ2-9)    PHQ-2 Score: 0   Alcohol Screen: Low Risk (07/19/2024)   Alcohol Screen    Last Alcohol Screening Score (AUDIT): 0  Housing: Unknown (07/19/2024)   Epic    Unable to Pay for Housing in the Last Year: No    Number of Times Moved in the Last Year: Not  on file    Homeless in the Last Year: No  Utilities: Not At Risk (07/19/2024)   Epic    Threatened with loss of utilities: No  Health Literacy: Adequate Health Literacy (07/19/2024)   B1300 Health Literacy    Frequency of need for help with medical instructions: Never    Outpatient Medications Prior to Visit  Medication Sig Dispense Refill   Cholecalciferol (VITAMIN D ) 50 MCG (2000 UT) tablet Take 2,000 Units by mouth daily.     fluticasone  (FLONASE ) 50 MCG/ACT nasal spray Place 2 sprays into both nostrils daily as needed for allergies.     hydrochlorothiazide  (HYDRODIURIL ) 25 MG tablet Take 0.5 tablets (12.5 mg total) by mouth daily. 45 tablet 1   lisinopril  (ZESTRIL ) 20 MG tablet Take 1 tablet (20 mg total) by mouth daily. 90 tablet 0   Multiple Vitamins-Minerals (ICAPS AREDS 2) CAPS Take 1 capsule by mouth in the morning and at bedtime.     Olopatadine  HCl (PATADAY ) 0.2 % SOLN 1 gtt each eye qd (Patient taking differently: Place 1 drop into both ears daily at 6 (six) AM. 1 gtt each eye qd)  0   OVER THE COUNTER MEDICATION Apply 1 Application topically daily as needed (pain). THC pain relieving cream     rosuvastatin  (CRESTOR ) 20 MG tablet Take 1 tablet (20 mg total) by mouth daily. 90 tablet 1   Wheat Dextrin (BENEFIBER) POWD Take 1 Dose by mouth daily. 1 dose = 2 teaspoons     clotrimazole -betamethasone  (LOTRISONE ) cream Apply 1 application topically 2 (two) times daily. Use as needed for rash 30 g 1   metroNIDAZOLE  (FLAGYL ) 250 MG tablet Take 1 tablet (250 mg total) by mouth 3 (three) times daily. 42 tablet 1   terbinafine  (LAMISIL ) 250 MG tablet Take 1 tablet (250 mg total) by mouth daily. 90 tablet 0   No facility-administered medications prior to  visit.    Allergies[1]  Review of Systems  Constitutional:  Negative for chills, fever and malaise/fatigue.  HENT:  Negative for congestion and hearing loss.   Eyes:  Negative for discharge.  Respiratory:  Negative for cough, sputum production and shortness of breath.   Cardiovascular:  Negative for chest pain, palpitations and leg swelling.  Gastrointestinal:  Negative for abdominal pain, blood in stool, constipation, diarrhea, heartburn, nausea and vomiting.  Genitourinary:  Negative for dysuria, frequency, hematuria and urgency.  Musculoskeletal:  Negative for back pain, falls and myalgias.  Skin:  Negative for rash.  Neurological:  Negative for dizziness, sensory change, loss of consciousness, weakness and headaches.  Endo/Heme/Allergies:  Negative for environmental allergies. Does not bruise/bleed easily.  Psychiatric/Behavioral:  Negative for depression and suicidal ideas. The patient is not nervous/anxious and does not have insomnia.        Objective:    Physical Exam Vitals and nursing note reviewed.  Constitutional:      General: She is not in acute distress.    Appearance: Normal appearance. She is well-developed.  HENT:     Head: Normocephalic and atraumatic.     Right Ear: Tympanic membrane, ear canal and external ear normal. There is no impacted cerumen.     Left Ear: Tympanic membrane, ear canal and external ear normal. There is no impacted cerumen.     Nose: Nose normal.     Mouth/Throat:     Mouth: Mucous membranes are moist.     Pharynx: Oropharynx is clear. No oropharyngeal exudate or posterior oropharyngeal erythema.  Eyes:  General: No scleral icterus.       Right eye: No discharge.        Left eye: No discharge.     Conjunctiva/sclera: Conjunctivae normal.     Pupils: Pupils are equal, round, and reactive to light.  Neck:     Thyroid : No thyromegaly or thyroid  tenderness.     Vascular: No JVD.  Cardiovascular:     Rate and Rhythm: Normal rate and  regular rhythm.     Heart sounds: Normal heart sounds. No murmur heard. Pulmonary:     Effort: Pulmonary effort is normal. No respiratory distress.     Breath sounds: Normal breath sounds.  Abdominal:     General: Bowel sounds are normal. There is no distension.     Palpations: Abdomen is soft. There is no mass.     Tenderness: There is no abdominal tenderness. There is no guarding or rebound.  Genitourinary:    Vagina: Normal.  Musculoskeletal:        General: Normal range of motion.     Cervical back: Normal range of motion and neck supple.     Right lower leg: No edema.     Left lower leg: No edema.  Lymphadenopathy:     Cervical: No cervical adenopathy.  Skin:    General: Skin is warm and dry.     Findings: No erythema or rash.  Neurological:     Mental Status: She is alert and oriented to person, place, and time.     Cranial Nerves: No cranial nerve deficit.     Deep Tendon Reflexes: Reflexes are normal and symmetric.  Psychiatric:        Mood and Affect: Mood normal.        Behavior: Behavior normal.        Thought Content: Thought content normal.        Judgment: Judgment normal.     BP (!) 142/78 (BP Location: Left Arm, Patient Position: Sitting, Cuff Size: Normal)   Pulse 68   Temp 98.2 F (36.8 C) (Oral)   Resp 16   Ht 4' 11 (1.499 m)   Wt 158 lb 3.2 oz (71.8 kg)   SpO2 97%   BMI 31.95 kg/m  Wt Readings from Last 3 Encounters:  12/27/24 158 lb 3.2 oz (71.8 kg)  10/25/24 160 lb 6.4 oz (72.8 kg)  07/19/24 160 lb (72.6 kg)    Diabetic Foot Exam - Simple   No data filed    Lab Results  Component Value Date   WBC 5.9 06/07/2024   HGB 14.9 06/07/2024   HCT 44.7 06/07/2024   PLT 227.0 06/07/2024   GLUCOSE 103 (H) 06/07/2024   CHOL 139 06/07/2024   TRIG 121.0 06/07/2024   HDL 41.00 06/07/2024   LDLDIRECT 129.1 08/23/2007   LDLCALC 74 06/07/2024   ALT 31 06/07/2024   AST 30 06/07/2024   NA 140 06/07/2024   K 4.7 06/07/2024   CL 99 06/07/2024    CREATININE 0.77 06/07/2024   BUN 16 06/07/2024   CO2 35 (H) 06/07/2024   TSH 2.37 06/07/2024   INR 1.0 08/08/2022   HGBA1C 5.9 (H) 08/02/2020    Lab Results  Component Value Date   TSH 2.37 06/07/2024   Lab Results  Component Value Date   WBC 5.9 06/07/2024   HGB 14.9 06/07/2024   HCT 44.7 06/07/2024   MCV 88.9 06/07/2024   PLT 227.0 06/07/2024   Lab Results  Component Value Date   NA  140 06/07/2024   K 4.7 06/07/2024   CO2 35 (H) 06/07/2024   GLUCOSE 103 (H) 06/07/2024   BUN 16 06/07/2024   CREATININE 0.77 06/07/2024   BILITOT 0.7 06/07/2024   ALKPHOS 95 06/07/2024   AST 30 06/07/2024   ALT 31 06/07/2024   PROT 7.5 06/07/2024   ALBUMIN 4.2 06/07/2024   CALCIUM  9.9 06/07/2024   ANIONGAP 12 04/19/2023   GFR 71.61 06/07/2024   Lab Results  Component Value Date   CHOL 139 06/07/2024   Lab Results  Component Value Date   HDL 41.00 06/07/2024   Lab Results  Component Value Date   LDLCALC 74 06/07/2024   Lab Results  Component Value Date   TRIG 121.0 06/07/2024   Lab Results  Component Value Date   CHOLHDL 3 06/07/2024   Lab Results  Component Value Date   HGBA1C 5.9 (H) 08/02/2020       Assessment & Plan:  Preventative health care Assessment & Plan: Ghm utd Check labs  See AVS  Health Maintenance  Topic Date Due   Diabetic kidney evaluation - Urine ACR  Never done   HEMOGLOBIN A1C  02/02/2021   Mammogram  12/16/2024   OPHTHALMOLOGY EXAM  02/14/2025   COVID-19 Vaccine (8 - Pfizer risk 2025-26 season) 03/02/2025   FOOT EXAM  04/19/2025   Diabetic kidney evaluation - eGFR measurement  06/07/2025   Medicare Annual Wellness (AWV)  07/19/2025   DTaP/Tdap/Td (3 - Td or Tdap) 06/05/2031   Pneumococcal Vaccine: 50+ Years  Completed   Influenza Vaccine  Completed   Bone Density Scan  Completed   Zoster Vaccines- Shingrix  Completed   Meningococcal B Vaccine  Aged Out   Hepatitis C Screening  Discontinued      Rash -      Clotrimazole -Betamethasone ; Apply 1 Application topically 2 (two) times daily. Use as needed for rash  Dispense: 30 g; Refill: 1  Essential hypertension Assessment & Plan: Well controlled, no changes to meds. Encouraged heart healthy diet such as the DASH diet and exercise as tolerated.    Orders: -     Lipid panel -     CBC with Differential/Platelet -     Comprehensive metabolic panel with GFR -     Hemoglobin A1c -     Microalbumin / creatinine urine ratio  Hyperlipidemia, unspecified hyperlipidemia type Assessment & Plan: Encourage heart healthy diet such as MIND or DASH diet, increase exercise, avoid trans fats, simple carbohydrates and processed foods, consider a krill or fish or flaxseed oil cap daily.    Orders: -     Lipid panel -     Comprehensive metabolic panel with GFR  Morbid obesity (HCC)  Vitamin D  deficiency  Prediabetes Assessment & Plan: Watch simple sugars and starches  Check labs    Age-related osteoporosis without current pathological fracture Assessment & Plan: Con't calcium  1200 mg daily and vita d 3 1000u daily Weight bearing exercise    Assessment and Plan Assessment & Plan Annual physical examination   Routine annual physical examination reveals no new surgeries or significant changes in family history. She underwent blepharoplasty in August for drooping eyelids. Macular degeneration is managed with a second opinion from a retina specialist. No recent bone density scans. She regularly follows up with a gastroenterologist for stomach issues and will start metronidazole  as prescribed. No recent dental visits due to the retirement of her previous dentist. A mammogram is scheduled as the last one was in January 2025.  Blood work is ordered. Continue follow-up with the retina specialist for macular degeneration. She needs to find a new dentist for regular dental care.    Imre Vecchione R Lowne Chase, DO     [1]  Allergies Allergen Reactions   Lactose  Intolerance (Gi)     Upset stomach/ bloating    "

## 2024-12-28 LAB — HEMOGLOBIN A1C: Hgb A1c MFr Bld: 6 % (ref 4.6–6.5)

## 2024-12-28 LAB — COMPREHENSIVE METABOLIC PANEL WITH GFR
ALT: 25 U/L (ref 3–35)
AST: 27 U/L (ref 5–37)
Albumin: 4.2 g/dL (ref 3.5–5.2)
Alkaline Phosphatase: 80 U/L (ref 39–117)
BUN: 15 mg/dL (ref 6–23)
CO2: 34 meq/L — ABNORMAL HIGH (ref 19–32)
Calcium: 10.3 mg/dL (ref 8.4–10.5)
Chloride: 101 meq/L (ref 96–112)
Creatinine, Ser: 0.73 mg/dL (ref 0.40–1.20)
GFR: 76.05 mL/min
Glucose, Bld: 88 mg/dL (ref 70–99)
Potassium: 5.3 meq/L — ABNORMAL HIGH (ref 3.5–5.1)
Sodium: 143 meq/L (ref 135–145)
Total Bilirubin: 0.6 mg/dL (ref 0.2–1.2)
Total Protein: 7.6 g/dL (ref 6.0–8.3)

## 2024-12-28 LAB — CBC WITH DIFFERENTIAL/PLATELET
Basophils Absolute: 0.1 K/uL (ref 0.0–0.1)
Basophils Relative: 0.8 % (ref 0.0–3.0)
Eosinophils Absolute: 0.2 K/uL (ref 0.0–0.7)
Eosinophils Relative: 3.7 % (ref 0.0–5.0)
HCT: 45.2 % (ref 36.0–46.0)
Hemoglobin: 14.8 g/dL (ref 12.0–15.0)
Lymphocytes Relative: 24.3 % (ref 12.0–46.0)
Lymphs Abs: 1.6 K/uL (ref 0.7–4.0)
MCHC: 32.7 g/dL (ref 30.0–36.0)
MCV: 89.4 fl (ref 78.0–100.0)
Monocytes Absolute: 0.6 K/uL (ref 0.1–1.0)
Monocytes Relative: 9.7 % (ref 3.0–12.0)
Neutro Abs: 4.1 K/uL (ref 1.4–7.7)
Neutrophils Relative %: 61.5 % (ref 43.0–77.0)
Platelets: 260 K/uL (ref 150.0–400.0)
RBC: 5.06 Mil/uL (ref 3.87–5.11)
RDW: 15.2 % (ref 11.5–15.5)
WBC: 6.7 K/uL (ref 4.0–10.5)

## 2024-12-28 LAB — LIPID PANEL
Cholesterol: 146 mg/dL (ref 28–200)
HDL: 45.3 mg/dL
LDL Cholesterol: 74 mg/dL (ref 10–99)
NonHDL: 100.9
Total CHOL/HDL Ratio: 3
Triglycerides: 134 mg/dL (ref 10.0–149.0)
VLDL: 26.8 mg/dL (ref 0.0–40.0)

## 2024-12-28 LAB — MICROALBUMIN / CREATININE URINE RATIO
Creatinine,U: 77.8 mg/dL
Microalb Creat Ratio: 18.3 mg/g (ref 0.0–30.0)
Microalb, Ur: 1.4 mg/dL (ref 0.7–1.9)

## 2024-12-28 NOTE — Assessment & Plan Note (Signed)
 Encourage heart healthy diet such as MIND or DASH diet, increase exercise, avoid trans fats, simple carbohydrates and processed foods, consider a krill or fish or flaxseed oil cap daily.

## 2024-12-28 NOTE — Assessment & Plan Note (Signed)
 Well controlled, no changes to meds. Encouraged heart healthy diet such as the DASH diet and exercise as tolerated.

## 2024-12-28 NOTE — Assessment & Plan Note (Signed)
 Con't calcium  1200 mg daily and vita d 3 1000u daily Weight bearing exercise

## 2025-01-01 ENCOUNTER — Ambulatory Visit: Payer: Self-pay | Admitting: Family Medicine

## 2025-01-01 DIAGNOSIS — E875 Hyperkalemia: Secondary | ICD-10-CM

## 2025-01-01 NOTE — Assessment & Plan Note (Signed)
 Not present on exam Needs refill on med for as needed use

## 2025-01-05 ENCOUNTER — Ambulatory Visit: Payer: Self-pay | Admitting: Family Medicine

## 2025-01-05 ENCOUNTER — Other Ambulatory Visit (INDEPENDENT_AMBULATORY_CARE_PROVIDER_SITE_OTHER)

## 2025-01-05 DIAGNOSIS — E875 Hyperkalemia: Secondary | ICD-10-CM | POA: Diagnosis not present

## 2025-01-05 DIAGNOSIS — B351 Tinea unguium: Secondary | ICD-10-CM

## 2025-01-05 LAB — BASIC METABOLIC PANEL WITH GFR
BUN: 23 mg/dL (ref 6–23)
CO2: 33 meq/L — ABNORMAL HIGH (ref 19–32)
Calcium: 9.8 mg/dL (ref 8.4–10.5)
Chloride: 100 meq/L (ref 96–112)
Creatinine, Ser: 0.73 mg/dL (ref 0.40–1.20)
GFR: 76.04 mL/min
Glucose, Bld: 101 mg/dL — ABNORMAL HIGH (ref 70–99)
Potassium: 3.9 meq/L (ref 3.5–5.1)
Sodium: 139 meq/L (ref 135–145)

## 2025-01-31 ENCOUNTER — Ambulatory Visit: Admitting: Gastroenterology

## 2025-02-08 ENCOUNTER — Ambulatory Visit: Admitting: Internal Medicine

## 2025-04-20 ENCOUNTER — Encounter (INDEPENDENT_AMBULATORY_CARE_PROVIDER_SITE_OTHER): Admitting: Ophthalmology

## 2025-06-27 ENCOUNTER — Ambulatory Visit: Admitting: Family Medicine

## 2025-07-25 ENCOUNTER — Ambulatory Visit

## 2025-12-28 ENCOUNTER — Encounter: Admitting: Family Medicine
